# Patient Record
Sex: Female | Born: 1970 | Race: White | Hispanic: No | Smoking: Never smoker
Health system: Southern US, Community
[De-identification: ages and names within clinical notes are randomized; demographics above are authoritative.]

## PROBLEM LIST (undated history)

## (undated) DIAGNOSIS — I252 Old myocardial infarction: Secondary | ICD-10-CM

## (undated) DIAGNOSIS — E11319 Type 2 diabetes mellitus with unspecified diabetic retinopathy without macular edema: Secondary | ICD-10-CM

## (undated) DIAGNOSIS — H35039 Hypertensive retinopathy, unspecified eye: Secondary | ICD-10-CM

## (undated) DIAGNOSIS — E119 Type 2 diabetes mellitus without complications: Secondary | ICD-10-CM

## (undated) DIAGNOSIS — M1990 Unspecified osteoarthritis, unspecified site: Secondary | ICD-10-CM

## (undated) DIAGNOSIS — I10 Essential (primary) hypertension: Secondary | ICD-10-CM

## (undated) DIAGNOSIS — I2510 Atherosclerotic heart disease of native coronary artery without angina pectoris: Secondary | ICD-10-CM

## (undated) DIAGNOSIS — E785 Hyperlipidemia, unspecified: Secondary | ICD-10-CM

## (undated) HISTORY — PX: CARDIAC CATHETERIZATION: SHX172

## (undated) HISTORY — DX: Hypertensive retinopathy, unspecified eye: H35.039

## (undated) HISTORY — PX: TRIGGER FINGER RELEASE: SHX641

## (undated) HISTORY — DX: Type 2 diabetes mellitus with unspecified diabetic retinopathy without macular edema: E11.319

---

## 1998-02-23 ENCOUNTER — Emergency Department (HOSPITAL_COMMUNITY): Admission: EM | Admit: 1998-02-23 | Discharge: 1998-02-23 | Payer: Self-pay | Admitting: Emergency Medicine

## 1998-02-25 ENCOUNTER — Emergency Department (HOSPITAL_COMMUNITY): Admission: EM | Admit: 1998-02-25 | Discharge: 1998-02-26 | Payer: Self-pay | Admitting: Emergency Medicine

## 1998-11-01 ENCOUNTER — Emergency Department (HOSPITAL_COMMUNITY): Admission: EM | Admit: 1998-11-01 | Discharge: 1998-11-01 | Payer: Self-pay | Admitting: Internal Medicine

## 1999-09-02 ENCOUNTER — Emergency Department (HOSPITAL_COMMUNITY): Admission: EM | Admit: 1999-09-02 | Discharge: 1999-09-02 | Payer: Self-pay | Admitting: *Deleted

## 2003-07-13 ENCOUNTER — Emergency Department (HOSPITAL_COMMUNITY): Admission: EM | Admit: 2003-07-13 | Discharge: 2003-07-13 | Payer: Self-pay | Admitting: Emergency Medicine

## 2003-07-13 ENCOUNTER — Encounter: Payer: Self-pay | Admitting: Emergency Medicine

## 2016-02-07 DIAGNOSIS — M659 Unspecified synovitis and tenosynovitis, unspecified site: Secondary | ICD-10-CM | POA: Insufficient documentation

## 2016-02-16 ENCOUNTER — Encounter (HOSPITAL_BASED_OUTPATIENT_CLINIC_OR_DEPARTMENT_OTHER): Payer: Self-pay | Admitting: Emergency Medicine

## 2016-02-16 ENCOUNTER — Emergency Department (HOSPITAL_BASED_OUTPATIENT_CLINIC_OR_DEPARTMENT_OTHER)
Admission: EM | Admit: 2016-02-16 | Discharge: 2016-02-16 | Disposition: A | Discharge status: Home / Self Care | Payer: Self-pay | Attending: Emergency Medicine | Admitting: Emergency Medicine

## 2016-02-16 DIAGNOSIS — E119 Type 2 diabetes mellitus without complications: Secondary | ICD-10-CM | POA: Insufficient documentation

## 2016-02-16 DIAGNOSIS — Z791 Long term (current) use of non-steroidal anti-inflammatories (NSAID): Secondary | ICD-10-CM | POA: Insufficient documentation

## 2016-02-16 DIAGNOSIS — M79644 Pain in right finger(s): Secondary | ICD-10-CM | POA: Insufficient documentation

## 2016-02-16 HISTORY — DX: Type 2 diabetes mellitus without complications: E11.9

## 2016-02-16 NOTE — Discharge Instructions (Signed)

## 2016-02-16 NOTE — ED Notes (Signed)
45 yof PMHx DM, presents with right thumb pain since January 07, 2016. Seen PCP, was dx with "trigger finger" and splinted x 4 weeks. Followed up w/ hand surgeon, on 5/15. Received steroid injection. Has MRI scheduled for 5/30.  No relief with naproxen.   Requesting MRI to be done today

## 2016-02-16 NOTE — ED Provider Notes (Signed)
CSN: [id number]     Arrival date & time 02/16/16  0726 History   None    Chief Complaint  Patient presents with  . Thumb Pain    HPI   Sarah Kane is a 45 year old lady with type 2 diabetes here with thumb pain.  In early April, she awoke one morning and could not flex her right thumb DIP joint. She denied any preceding injury, associate effusion, or erythema, and this had never happened to her before. She went to an urgent care where x-rays reportedly did not show subluxation nor osteoarthritic changes. She then went to a hand surgeon who was reportedly puzzled by her pathology and ordered an MRI that is scheduled for next Monday. She decided to come in to the emergency department today for a second opinion. She simply cannot flex her right thumb DIP but denies any locking sensation, clicking, swelling, redness, fevers, or excruciating pain.  Past Medical History  Diagnosis Date  . Diabetes mellitus without complication [facility])    Past Surgical History  Procedure Laterality Date  . Cesarean section  1990   History reviewed. No pertinent family history. Social History  Substance Use Topics  . Smoking status: Never Smoker   . Smokeless tobacco: None  . Alcohol Use: No   OB History    No data available     Review of Systems  Constitutional: Negative for fever and chills.  Musculoskeletal: Negative for myalgias, back pain, joint swelling and arthralgias.  Skin: Negative for rash.  Neurological: Negative for weakness and numbness.  Psychiatric/Behavioral: Negative for dysphoric mood. The patient is nervous/anxious.    Allergies  Review of patient's allergies indicates no known allergies.  Home Medications   Prior to Admission medications   Medication Sig Start Date End Date Taking? Authorizing Provider  NAPROXEN PO Take by mouth.   Yes Historical Provider, MD   BP 146/115 mmHg  Pulse 96  Temp(Src) 98.2 F (36.8 C) (Oral)  Resp 18  Ht 5' (1.524 m)  Wt 74.844 kg  BMI 32.22  kg/m2  SpO2 100% Physical Exam  Constitutional: She appears well-developed and well-nourished.  HENT:  Head: Normocephalic and atraumatic.  Eyes: Conjunctivae are normal. Pupils are equal, round, and reactive to light.  Neck: Normal range of motion. Neck supple.  Cardiovascular: Normal rate and regular rhythm.   Pulmonary/Chest: Effort normal and breath sounds normal.  Musculoskeletal:  There is no overt swelling, erythema, nodules, hypothenar atrophy, nor skin changes of the hands. Passive flexion of the right 1st DIP is restricted to about 20 degrees due to pain. Otherwise, opposition and all of her other intrinsic hand muscles are intact with strength 5/5.  Skin: Skin is warm and dry. No rash noted.    ED Course  Procedures (including critical care time) Labs Review Labs Reviewed - No data to display  Imaging Review No results found. I have personally reviewed and evaluated these images and lab results as part of my medical decision-making.   EKG Interpretation None      MDM   Final diagnoses:  Thumb pain, right   Sarah Kane is a 45 year old lady here for a second opinion of isolated decreased range of motion of the right first DIP. Frankly, I don't know have a great explanation of her symptom. My best guess is this is isolated osteoarthritis of the DIP joint, but this should have been seen on previous X-rays. There is no locking sensation nor radial pain so I  doubt de Quervain's tendinopathy. The joint is not erythematous nor with effusion so I doubt gout. We will get her a thumb spica and have her follow-up closely with the hand surgeon.  Selina Cooley, MD 02/16/16 1610  Geoffery Lyons, MD 02/16/16 [phone]

## 2016-03-03 DIAGNOSIS — M65311 Trigger thumb, right thumb: Secondary | ICD-10-CM | POA: Insufficient documentation

## 2016-06-16 DIAGNOSIS — IMO0002 Reserved for concepts with insufficient information to code with codable children: Secondary | ICD-10-CM | POA: Insufficient documentation

## 2016-09-08 ENCOUNTER — Emergency Department (HOSPITAL_BASED_OUTPATIENT_CLINIC_OR_DEPARTMENT_OTHER)
Admission: EM | Admit: 2016-09-08 | Discharge: 2016-09-08 | Disposition: A | Discharge status: Home / Self Care | Payer: BLUE CROSS/BLUE SHIELD | Attending: Emergency Medicine | Admitting: Emergency Medicine

## 2016-09-08 ENCOUNTER — Encounter (HOSPITAL_BASED_OUTPATIENT_CLINIC_OR_DEPARTMENT_OTHER): Payer: Self-pay | Admitting: *Deleted

## 2016-09-08 DIAGNOSIS — M06041 Rheumatoid arthritis without rheumatoid factor, right hand: Secondary | ICD-10-CM | POA: Diagnosis not present

## 2016-09-08 DIAGNOSIS — M069 Rheumatoid arthritis, unspecified: Secondary | ICD-10-CM

## 2016-09-08 DIAGNOSIS — Z79899 Other long term (current) drug therapy: Secondary | ICD-10-CM | POA: Insufficient documentation

## 2016-09-08 DIAGNOSIS — E119 Type 2 diabetes mellitus without complications: Secondary | ICD-10-CM | POA: Diagnosis not present

## 2016-09-08 DIAGNOSIS — M79641 Pain in right hand: Secondary | ICD-10-CM | POA: Diagnosis present

## 2016-09-08 HISTORY — DX: Unspecified osteoarthritis, unspecified site: M19.90

## 2016-09-08 MED ORDER — OXYCODONE-ACETAMINOPHEN 5-325 MG PO TABS: 1.0000 | ORAL_TABLET | ORAL | 0 refills | Status: DC | PRN

## 2016-09-08 MED ORDER — METHYLPREDNISOLONE 4 MG PO TBPK: ORAL_TABLET | ORAL | 0 refills | Status: DC

## 2016-09-08 MED FILL — METHYLPREDNISOLONE 4 MG TAB: 4 | 6 days supply | Qty: 21 | Fill #0

## 2016-09-08 MED FILL — OXYCODONE/APAP 5-325: 5-325 | 3 days supply | Qty: 15 | Fill #0

## 2016-09-08 NOTE — ED Triage Notes (Signed)
Right hand pain. States she needs pain medication for arthritis in that hand.

## 2016-09-08 NOTE — ED Notes (Signed)
Ongoing R hand pain since April. Pt dx with trigger thumb and had surgery for same in July without relief. Pt has had PT and has since had swelling to R hand. Pt has since been dx with RA. Pt was seen by her doctor on 12/12 and was referred to a rheumatologist and has not been able to schedule an appt yet. Pt was given meloxicam but states this is not working.  Pt presents requesting pain medication for same. Pt had is swollen, pt states this swelling is baseline since her surgery.

## 2016-09-08 NOTE — ED Provider Notes (Signed)
[facility]   CSN: [id number] Arrival date & time: 09/08/16  1114     History   Chief Complaint Chief Complaint  Patient presents with  . Hand Pain    HPI Sarah Kane is a 45 y.o. female.  HPI   Patient is a 45 year old female with history of diabetes and arthritis who presents the ED with complaints of continued chronic right hand pain and swelling. Patient reports after having surgery for her right thumb trigger finger in July she has continued to have right hand pain and swelling. She notes she has been evaluated by multiple physicians including Dr. Rosalia Hammers [facility]) and Dr. Orlan Leavens (hand surgery) and had multiple imaging studies performed including x-rays and MRI which showed findings consistent with rheumatoid arthritis. Patient reports she was referred to a rheumatologist but is unable to make an appointment until 3 months from now. She states she has been taking meloxicam without relief. Patient denies any recent fall, trauma or injury. She reports her symptoms today are consistent with symptoms she has been having over the past 4-6 months. Denies fever, chills, redness, warmth, numbness, tingling, weakness.  Past Medical History:  Diagnosis Date  . Arthritis   . Diabetes mellitus without complication (HCC)     There are no active problems to display for this patient.   Past Surgical History:  Procedure Laterality Date  . CESAREAN SECTION  1990    OB History    No data available       Home Medications    Prior to Admission medications   Medication Sig Start Date End Date Taking? Authorizing Provider  MELOXICAM PO Take by mouth.   Yes Historical Provider, MD  methylPREDNISolone (MEDROL DOSEPAK) 4 MG TBPK tablet Day 1: Two tablets before breakfast, one after lunch, one after dinner, and two at bedtime. If started late in the day, take all six tablets at once or divide into two or three doses, unless otherwise directed by prescriber. Day 2:  One tablet before breakfast, one after lunch, one after dinner, and two at bedtime Day 3: One tablet before breakfast, one after lunch, one after dinner, and one at bedtime Day 4: One tablet before breakfast, one af 09/08/16   [name], PA-C  NAPROXEN PO Take by mouth.    Historical Provider, MD  oxyCODONE-acetaminophen (PERCOCET/ROXICET) 5-325 MG tablet Take 1 tablet by mouth every 4 (four) hours as needed. 09/08/16   [name], PA-C    Family History No family history on file.  Social History Social History  Substance Use Topics  . Smoking status: Never Smoker  . Smokeless tobacco: Never Used  . Alcohol use No     Allergies   Patient has no known allergies.   Review of Systems Review of Systems  Constitutional: Negative for fever.  Musculoskeletal: Positive for arthralgias (right hand) and joint swelling.  Skin: Negative for wound.  Neurological: Negative for weakness and numbness.     Physical Exam Updated Vital Signs BP 149/100 (BP Location: Left Arm)   Pulse 77   Temp 97.6 F (36.4 C) (Oral)   Resp 20   Ht 5' (1.524 m)   Wt 72.6 kg   SpO2 100%   BMI 31.25 kg/m   Physical Exam  Constitutional: She is oriented to person, place, and time. She appears well-developed and well-nourished. No distress.  HENT:  Head: Normocephalic and atraumatic.  Eyes: Conjunctivae and EOM are normal. Right eye exhibits no discharge. Left  eye exhibits no discharge. No scleral icterus.  Neck: Normal range of motion. Neck supple.  Cardiovascular: Normal rate and intact distal pulses.   Pulmonary/Chest: Effort normal.  Musculoskeletal: She exhibits tenderness. She exhibits no edema or deformity.       Right wrist: Normal.       Right hand: She exhibits decreased range of motion, tenderness and swelling. She exhibits normal two-point discrimination, normal capillary refill, no deformity and no laceration. Normal sensation noted. Normal strength noted.        Hands: Right hand with diffuse mild swelling and TTP over right 2nd and 3rd MCP joints. Dec ROM of right hand, pt unable to fully extend her fingers or make a fist which she reports has been chronic since her surgery and is unchanged. Sensation grossly intact. 2+ radial pulse. Cap refill less than 2. No erythema, warmth, abrasion, contusion or laceration noted.  Neurological: She is alert and oriented to person, place, and time.  Skin: Skin is warm and dry. Capillary refill takes less than 2 seconds. She is not diaphoretic.  Nursing note and vitals reviewed.    ED Treatments / Results  Labs (all labs ordered are listed, but only abnormal results are displayed) Labs Reviewed - No data to display  EKG  EKG Interpretation None       Radiology No results found.  Procedures Procedures (including critical care time)  Medications Ordered in ED Medications - No data to display   Initial Impression / Assessment and Plan / ED Course  I have reviewed the triage vital signs and the nursing notes.  Pertinent labs & imaging results that were available during my care of the patient were reviewed by me and considered in my medical decision making (see chart for details).  Clinical Course     Patient presents with continued right hand pain and swelling over the past 6 months. She reports being seen by multiple physicians regarding her symptoms and reports after having x-rays and MRI performed she was diagnosed with rheumatoid arthritis. She reports she hasn't appointment scheduled with a rheumatologist but it is not until 3 months from now. She reports she has been taking meloxicam, naproxen and ibuprofen without relief of symptoms. Denies fever or any recent injury or trauma. VSS. Exam revealed diffuse mild swelling and tenderness to right hand, decreased range of motion with full flexion and extension of right digits which patient reports is chronic and unchanged. Right upper extremity  otherwise neurovascularly intact. Chart review shows MRI of right hand performed on 08/11/16 revealed multifocal MCP arthritis most consistent with rheumatoid arthritis with tinnitus and periarticular erosion involving the third MCP joint. Patient symptoms appeared to be consistent with her history of rheumatoid arthritis. Plan to discharge patient home with Solu-Medrol Dosepak, pain meds and symptomatic treatment. Advised patient she needs to follow up with rheumatology for further management of her chronic right hand pain related to her rheumatoid arthritis. Patient given information to follow-up with rheumatology outpatient. Discussed return precautions.  Final Clinical Impressions(s) / ED Diagnoses   Final diagnoses:  Rheumatoid arthritis involving right hand, unspecified rheumatoid factor presence (HCC)    New Prescriptions Discharge Medication List as of 09/08/2016 12:02 PM    START taking these medications   Details  methylPREDNISolone (MEDROL DOSEPAK) 4 MG TBPK tablet Day 1: Two tablets before breakfast, one after lunch, one after dinner, and two at bedtime. If started late in the day, take all six tablets at once or divide into two or  three doses, unless otherwise directed by prescriber. Day 2: One tablet before bre akfast, one after lunch, one after dinner, and two at bedtime Day 3: One tablet before breakfast, one after lunch, one after dinner, and one at bedtime Day 4: One tablet before breakfast, one af, Print    oxyCODONE-acetaminophen (PERCOCET/ROXICET) 5-325 MG tablet Take 1 tablet by mouth every 4 (four) hours as needed., Starting Fri 09/08/2016, Print         Satira Sark [place], [place] 09/08/16 1237    Lyndal Pulley, MD 09/08/16 1256

## 2016-09-08 NOTE — Discharge Instructions (Signed)
Take your medications as prescribed. Call the rheumatology clinic listed below to schedule a follow-up appointment for further management of your rheumatoid arthritis. Please return to the Emergency Department if symptoms worsen or new onset of fever, worsening swelling/pain, redness, numbness, tingling, weakness, decreased range of motion.

## 2016-10-02 DIAGNOSIS — Z79899 Other long term (current) drug therapy: Secondary | ICD-10-CM | POA: Insufficient documentation

## 2016-10-02 DIAGNOSIS — M79641 Pain in right hand: Secondary | ICD-10-CM | POA: Insufficient documentation

## 2016-10-02 DIAGNOSIS — R600 Localized edema: Secondary | ICD-10-CM | POA: Insufficient documentation

## 2017-03-06 DIAGNOSIS — G5600 Carpal tunnel syndrome, unspecified upper limb: Secondary | ICD-10-CM | POA: Insufficient documentation

## 2017-10-03 DIAGNOSIS — M19012 Primary osteoarthritis, left shoulder: Secondary | ICD-10-CM | POA: Insufficient documentation

## 2017-10-03 DIAGNOSIS — M7550 Bursitis of unspecified shoulder: Secondary | ICD-10-CM | POA: Insufficient documentation

## 2018-01-18 ENCOUNTER — Other Ambulatory Visit: Payer: Self-pay

## 2018-01-18 ENCOUNTER — Encounter (HOSPITAL_BASED_OUTPATIENT_CLINIC_OR_DEPARTMENT_OTHER): Payer: Self-pay | Admitting: Emergency Medicine

## 2018-01-18 ENCOUNTER — Emergency Department (HOSPITAL_BASED_OUTPATIENT_CLINIC_OR_DEPARTMENT_OTHER)
Admission: EM | Admit: 2018-01-18 | Discharge: 2018-01-18 | Disposition: A | Discharge status: Home / Self Care | Payer: BLUE CROSS/BLUE SHIELD | Attending: Emergency Medicine | Admitting: Emergency Medicine

## 2018-01-18 DIAGNOSIS — G8929 Other chronic pain: Secondary | ICD-10-CM | POA: Insufficient documentation

## 2018-01-18 DIAGNOSIS — M25512 Pain in left shoulder: Secondary | ICD-10-CM | POA: Diagnosis not present

## 2018-01-18 DIAGNOSIS — E119 Type 2 diabetes mellitus without complications: Secondary | ICD-10-CM | POA: Diagnosis not present

## 2018-01-18 DIAGNOSIS — Z79899 Other long term (current) drug therapy: Secondary | ICD-10-CM | POA: Insufficient documentation

## 2018-01-18 DIAGNOSIS — Z046 Encounter for general psychiatric examination, requested by authority: Secondary | ICD-10-CM | POA: Diagnosis present

## 2018-01-18 DIAGNOSIS — M25511 Pain in right shoulder: Secondary | ICD-10-CM | POA: Insufficient documentation

## 2018-01-18 NOTE — Discharge Instructions (Addendum)
You were evaluated in the emergency department for worsening of your chronic right and left shoulder pain.  He should continue to take your anti-inflammatories and follow-up with orthopedics to discuss further management of this problem.  I understand you are very frustrated about the chronic pain and am concerned about your statements about hurting herself.  We do recommend that you seek some counseling to help you deal with this chronic condition.  If you have any thoughts that you would actually hurt yourself you should come to the emergency department and seek help.

## 2018-01-18 NOTE — ED Triage Notes (Signed)
L shoulder pain x 1 year. States her doctors have not helped her. She says she told her employer today that she was going to kill herself so the police came. Pt says she didn't mean it.

## 2018-01-18 NOTE — ED Notes (Signed)
ED Provider at bedside. 

## 2018-01-18 NOTE — ED Provider Notes (Signed)
[facility] EMERGENCY DEPARTMENT Provider Note   CSN: [id number] Arrival date & time: 01/18/18  8341     History   Chief Complaint Chief Complaint  Patient presents with  . Shoulder Pain    HPI Sarah Kane is a 47 y.o. female.  She is here complaining of right and left shoulder pain that is been going on for over a year.  She has limitations secondary to her pain and she states she has been evaluated multiple times by various physicians who have not been able to give an answer what her pain is from.  Its increased with movement and she rates it as severe and sharp.  It sounds like today overnight she was overheard to say she was going to kill herself because the pain was so bad.  Patient here denies that she has any thoughts of harming herself but she is just frustrated about the pain.  She states she is unable to raise her hands up to her head and it even difficult to wipe her bottom.  The history is provided by the patient.  Shoulder Pain   This is a chronic problem. The problem occurs constantly. The problem has not changed since onset.The pain is present in the right shoulder, left shoulder and right hand. The quality of the pain is described as sharp. The pain is severe. Associated symptoms include limited range of motion and stiffness. Pertinent negatives include no numbness, no tingling and no itching. The symptoms are aggravated by activity. She has tried arthritis medications for the symptoms. The treatment provided no relief. There has been no history of extremity trauma.    Past Medical History:  Diagnosis Date  . Arthritis   . Diabetes mellitus without complication (HCC)     There are no active problems to display for this patient.   Past Surgical History:  Procedure Laterality Date  . CESAREAN SECTION  1990     OB History   None      Home Medications    Prior to Admission medications   Medication Sig Start Date End Date Taking? Authorizing  Provider  MELOXICAM PO Take by mouth.    [provider]  methylPREDNISolone (MEDROL DOSEPAK) 4 MG TBPK tablet Day 1: Two tablets before breakfast, one after lunch, one after dinner, and two at bedtime. If started late in the day, take all six tablets at once or divide into two or three doses, unless otherwise directed by prescriber. Day 2: One tablet before breakfast, one after lunch, one after dinner, and two at bedtime Day 3: One tablet before breakfast, one after lunch, one after dinner, and one at bedtime Day 4: One tablet before breakfast, one af 09/08/16   [name], PA-C  NAPROXEN PO Take by mouth.    [provider]  oxyCODONE-acetaminophen (PERCOCET/ROXICET) 5-325 MG tablet Take 1 tablet by mouth every 4 (four) hours as needed. 09/08/16   [name], PA-C    Family History No family history on file.  Social History Social History   Tobacco Use  . Smoking status: Never Smoker  . Smokeless tobacco: Never Used  Substance Use Topics  . Alcohol use: No  . Drug use: No     Allergies   Patient has no known allergies.   Review of Systems Review of Systems  Constitutional: Negative for fever.  HENT: Negative for sore throat.   Respiratory: Negative for shortness of breath.   Cardiovascular: Negative for chest pain.  Gastrointestinal: Negative for abdominal pain.  Genitourinary: Negative for dysuria.  Musculoskeletal: Positive for arthralgias and stiffness. Negative for back pain, joint swelling and neck pain.  Skin: Negative for itching and rash.  Neurological: Negative for tingling and numbness.  Psychiatric/Behavioral: Negative for self-injury and suicidal ideas.     Physical Exam Updated Vital Signs BP (!) 169/110 (BP Location: Left Arm)   Pulse (!) 106   Temp 98.5 F (36.9 C) (Oral)   Resp 18   Ht 5' (1.524 m)   Wt 77.1 kg (170 lb)   SpO2 100%   BMI 33.20 kg/m   Physical Exam  Constitutional: She appears  well-developed and well-nourished.  HENT:  Head: Normocephalic and atraumatic.  Eyes: Conjunctivae are normal.  Neck: Neck supple.  Cardiovascular: Normal rate and regular rhythm.  Pulmonary/Chest: Effort normal and breath sounds normal. She has no wheezes. She has no rales.  Musculoskeletal:       Right shoulder: She exhibits decreased range of motion, tenderness, bony tenderness and pain. She exhibits no swelling, no effusion, no deformity, no laceration and normal pulse.       Left shoulder: She exhibits decreased range of motion, tenderness, bony tenderness and pain. She exhibits no swelling, no effusion, no deformity and normal pulse.       Right hand: She exhibits decreased range of motion and tenderness. She exhibits no laceration and no swelling. Normal sensation noted.  Neurological: She is alert. GCS eye subscore is 4. GCS verbal subscore is 5. GCS motor subscore is 6.  Skin: Skin is warm and dry.  Psychiatric: She has a normal mood and affect.     ED Treatments / Results  Labs (all labs ordered are listed, but only abnormal results are displayed) Labs Reviewed - No data to display  EKG None  Radiology No results found.  Procedures Procedures (including critical care time)  Medications Ordered in ED Medications - No data to display   Initial Impression / Assessment and Plan / ED Course  I have reviewed the triage vital signs and the nursing notes.  Pertinent labs & imaging results that were available during my care of the patient were reviewed by me and considered in my medical decision making (see chart for details).  Clinical Course as of Jan 20 1724  Fri Jan 18, 2018  0907 Patient here with acute on chronic shoulder pain.  She has been very frustrated with her symptoms and the lack of any improvement with any medication.  She did admit that she had said that she wanted to kill herself but she states this is a throw a statement to her and she always feels this  way when she thinks about her pain.  She denies any intent to hurt herself and is just frustrated with being in constant pain.  We talked about getting x-rays, but after here in the limitations that there would only show a fracture dislocation she did not feel that was necessary.   [MB]  0908 Ultimately I do not think she needs psychiatric evaluation but she would benefit from being referred to some counseling and will provide her some information for that.  Also going to give her the number for orthopedic shoulder doctor to follow-up with to see if they can help her with her symptoms.   [MB]    Clinical Course User Index [MB] Terrilee Files, MD     Final Clinical Impressions(s) / ED Diagnoses   Final diagnoses:  Chronic pain of  both shoulders    ED Discharge Orders    None       Terrilee Files, MD 01/19/18 1726

## 2018-03-10 ENCOUNTER — Emergency Department (HOSPITAL_BASED_OUTPATIENT_CLINIC_OR_DEPARTMENT_OTHER)
Admission: EM | Admit: 2018-03-10 | Discharge: 2018-03-10 | Disposition: A | Discharge status: Home / Self Care | Payer: No Typology Code available for payment source | Attending: Emergency Medicine | Admitting: Emergency Medicine

## 2018-03-10 ENCOUNTER — Encounter (HOSPITAL_BASED_OUTPATIENT_CLINIC_OR_DEPARTMENT_OTHER): Payer: Self-pay | Admitting: Emergency Medicine

## 2018-03-10 ENCOUNTER — Emergency Department (HOSPITAL_BASED_OUTPATIENT_CLINIC_OR_DEPARTMENT_OTHER): Payer: No Typology Code available for payment source

## 2018-03-10 ENCOUNTER — Other Ambulatory Visit: Payer: Self-pay

## 2018-03-10 DIAGNOSIS — Y9289 Other specified places as the place of occurrence of the external cause: Secondary | ICD-10-CM | POA: Diagnosis not present

## 2018-03-10 DIAGNOSIS — Z79899 Other long term (current) drug therapy: Secondary | ICD-10-CM | POA: Insufficient documentation

## 2018-03-10 DIAGNOSIS — Y9389 Activity, other specified: Secondary | ICD-10-CM | POA: Diagnosis not present

## 2018-03-10 DIAGNOSIS — M25561 Pain in right knee: Secondary | ICD-10-CM | POA: Insufficient documentation

## 2018-03-10 DIAGNOSIS — M25551 Pain in right hip: Secondary | ICD-10-CM | POA: Diagnosis present

## 2018-03-10 DIAGNOSIS — Y990 Civilian activity done for income or pay: Secondary | ICD-10-CM | POA: Diagnosis not present

## 2018-03-10 DIAGNOSIS — E119 Type 2 diabetes mellitus without complications: Secondary | ICD-10-CM | POA: Diagnosis not present

## 2018-03-10 DIAGNOSIS — W19XXXA Unspecified fall, initial encounter: Secondary | ICD-10-CM

## 2018-03-10 DIAGNOSIS — M25562 Pain in left knee: Secondary | ICD-10-CM | POA: Insufficient documentation

## 2018-03-10 DIAGNOSIS — W010XXA Fall on same level from slipping, tripping and stumbling without subsequent striking against object, initial encounter: Secondary | ICD-10-CM | POA: Diagnosis not present

## 2018-03-10 MED ORDER — OXYCODONE-ACETAMINOPHEN 5-325 MG PO TABS
1.0000 | ORAL_TABLET | Freq: Once | ORAL | Status: AC
  Administered 2018-03-10: 1 via ORAL
  Filled 2018-03-10: qty 1

## 2018-03-10 NOTE — ED Provider Notes (Signed)
[facility] EMERGENCY DEPARTMENT Provider Note   CSN: [id number] Arrival date & time: 03/10/18  1542     History   Chief Complaint Chief Complaint  Patient presents with  . Fall    HPI KORAL THADEN is a 47 y.o. female.  HPI   Patient is a 47yo female with a history of type two diabetes and bilateral frozen shoulder who presents to the emergency department for evaluation after a mechanical fall earlier today. Patient states that she accidentally tripped over a vacuum cord while at work and fell forward onto her bilateral hands and knees. Denies hitting her head or loss of conciousness. Reports pain primarily over the lateral aspect of the right hip. Pain is sharp and 10/10 in severity with any movement or with weight bearing. She denies pain outside of movement. She also reports bruising over bilateral knee joints and some overlying soreness with flexion/extension. She has not taken any medication for pain prior to arrival. Denies headache, neck pain, back pain, numbness, weakness, open wounds, arthralgias elsewhere. Is able to ambulate independently, although very painful.   Past Medical History:  Diagnosis Date  . Arthritis   . Diabetes mellitus without complication (HCC)     There are no active problems to display for this patient.   Past Surgical History:  Procedure Laterality Date  . CESAREAN SECTION  1990     OB History   None      Home Medications    Prior to Admission medications   Medication Sig Start Date End Date Taking? Authorizing Provider  glimepiride (AMARYL) 2 MG tablet Take 2 mg by mouth daily with breakfast.   Yes [provider]  glimepiride (AMARYL) 2 MG tablet Take 2 tablets by mouth. 08/01/16  Yes [provider]  celecoxib (CELEBREX) 200 MG capsule 1 capsule. 12/31/17   [provider]  gabapentin (NEURONTIN) 300 MG capsule 1 capsule. 12/27/17   [provider]  MELOXICAM PO Take by mouth.    [provider]  methylPREDNISolone (MEDROL DOSEPAK) 4 MG TBPK tablet Day 1: Two tablets before breakfast, one after lunch, one after dinner, and two at bedtime. If started late in the day, take all six tablets at once or divide into two or three doses, unless otherwise directed by prescriber. Day 2: One tablet before breakfast, one after lunch, one after dinner, and two at bedtime Day 3: One tablet before breakfast, one after lunch, one after dinner, and one at bedtime Day 4: One tablet before breakfast, one af 09/08/16   [name], PA-C  NAPROXEN PO Take by mouth.    [provider]  oxyCODONE-acetaminophen (PERCOCET/ROXICET) 5-325 MG tablet Take 1 tablet by mouth every 4 (four) hours as needed. 09/08/16   [name], PA-C  OZEMPIC 0.25 or 0.5 MG/DOSE SOPN 2 mLs. 12/21/17   [provider]    Family History History reviewed. No pertinent family history.  Social History Social History   Tobacco Use  . Smoking status: Never Smoker  . Smokeless tobacco: Never Used  Substance Use Topics  . Alcohol use: No  . Drug use: No     Allergies   Patient has no known allergies.   Review of Systems Review of Systems  Constitutional: Negative for chills and fever.  Musculoskeletal: Positive for arthralgias (right hip and bilateral knees. ). Negative for back pain.  Skin: Positive for color change (ecchymosis overlying bilateral knees). Negative for wound.  Neurological: Negative for weakness,  numbness and headaches.     Physical Exam Updated Vital Signs BP (!) 148/100 (BP Location: Left Arm)   Pulse 99   Temp 98.1 F (36.7 C) (Oral)   Resp 20   Ht 5' (1.524 m)   Wt 77.1 kg (170 lb)   SpO2 100%   BMI 33.20 kg/m   Physical Exam  Constitutional: She is oriented to person, place, and time. She appears well-developed and well-nourished. No distress.  HENT:  Head: Normocephalic and atraumatic.  Eyes: Right eye exhibits no discharge. Left  eye exhibits no discharge.  Pulmonary/Chest: Effort normal. No respiratory distress.  Musculoskeletal:  Right lateral hip tender to palpation. No overlying ecchymosis, erythema or break in skin. Limited active ROM at the hip due to pain. Ecchymosis overlying bilateral patellas with overlying tenderness. No joint effusion, erythema or effusion noted. Full active flexion/extension of bilateral knees. No varus or valgus laxity.  Negative drawer's and McMurray's sign.  All compartments soft. No tenderness over bilateral ankles.  DP pulses 2+ bilaterally.  Distal sensation to light touch intact in bilateral lower extremities.  Neurological: She is alert and oriented to person, place, and time. Coordination normal.  Gait normal and coordination and balance.  Skin: Skin is warm and dry. Capillary refill takes less than 2 seconds. She is not diaphoretic.  Psychiatric: She has a normal mood and affect. Her behavior is normal.  Nursing note and vitals reviewed.    ED Treatments / Results  Labs (all labs ordered are listed, but only abnormal results are displayed) Labs Reviewed - No data to display  EKG None  Radiology Dg Knee Complete 4 Views Left  Result Date: 03/10/2018 CLINICAL DATA:  Tripped over cord at work. EXAM: LEFT KNEE - COMPLETE 4+ VIEW COMPARISON:  None. FINDINGS: No evidence of fracture, dislocation, or joint effusion. No evidence of arthropathy or other focal bone abnormality. Soft tissues are unremarkable. IMPRESSION: Negative. Electronically Signed   By: Signa Kell M.D.   On: 03/10/2018 17:52   Dg Knee Complete 4 Views Right  Result Date: 03/10/2018 CLINICAL DATA:  Fall.  Right hip pain. EXAM: RIGHT KNEE - COMPLETE 4+ VIEW COMPARISON:  None FINDINGS: No evidence of fracture, dislocation, or joint effusion. No evidence of arthropathy or other focal bone abnormality. Soft tissues are unremarkable. IMPRESSION: Negative. Electronically Signed   By: Signa Kell M.D.   On:  03/10/2018 17:51   Dg Hip Unilat W Or Wo Pelvis 2-3 Views Right  Result Date: 03/10/2018 CLINICAL DATA:  Fall.  Right hip pain. EXAM: DG HIP (WITH OR WITHOUT PELVIS) 2-3V RIGHT COMPARISON:  None FINDINGS: There is no evidence of hip fracture or dislocation. There is no evidence of arthropathy or other focal bone abnormality. IMPRESSION: 1. No acute findings. 2. If there is high clinical suspicion for occult fracture or the patient refuses to weightbear, consider further evaluation with MRI. Although CT is expeditious, evidence is lacking regarding accuracy of CT over plain film radiography. Electronically Signed   By: Signa Kell M.D.   On: 03/10/2018 17:50    Procedures Procedures (including critical care time)  Medications Ordered in ED Medications  oxyCODONE-acetaminophen (PERCOCET/ROXICET) 5-325 MG per tablet 1 tablet (1 tablet Oral Given 03/10/18 1824)     Initial Impression / Assessment and Plan / ED Course  I have reviewed the triage vital signs and the nursing notes.  Pertinent labs & imaging results that were available during my care of the patient were reviewed by me and considered  in my medical decision making (see chart for details).     X-ray right hip, right knee and left knee without acute fracture or abnormality.  Patient is able to ambulate in the department without difficulty.  Bilateral lower extremities neurovascularly intact.  No wounds.  Have counseled her on RICE protocol and NSAIDs for pain.  Her blood pressure was elevated in the ER today, counseled her to follow-up with her PCP for recheck.  Discussed reasons to return to the emergency department and she agrees and voices understanding to the above plan and has no complaints prior to discharge.   Final Clinical Impressions(s) / ED Diagnoses   Final diagnoses:  Fall, initial encounter  Right hip pain  Acute pain of both knees    ED Discharge Orders    None       Lawrence Marseilles 03/11/18  1173    Pricilla Loveless, MD 03/12/18 [phone]

## 2018-03-10 NOTE — ED Notes (Signed)
Pt's supervisor in room and contacting the boss to find out if a drug screen is needed.

## 2018-03-10 NOTE — ED Notes (Signed)
UDS completed 

## 2018-03-10 NOTE — ED Triage Notes (Signed)
Patient states that she fell earlier at work. The patient states that seh is having pain to her bilateral knees and hands - patient has chronic pain to her bilateral shoulders " I have frozen shoulder"

## 2018-03-10 NOTE — Discharge Instructions (Addendum)
Your x-rays are reassuring.  No hip or knee fracture.  As we discussed please apply ice for 15 minutes at a time at least twice a day.  Elevate the leg and try to rest.  You can take ibuprofen 600 mg every 6 hours.  Follow-up with your regular doctor in a week if your symptoms are not improving.

## 2018-04-02 LAB — CBC AND DIFFERENTIAL
HEMOGLOBIN: 17.1 — AB (ref 12.0–16.0)
PLATELETS: 243 (ref 150–399)
WBC: 8.1

## 2018-04-02 LAB — LIPID PANEL
Cholesterol: 180 (ref 0–200)
HDL: 45 (ref 35–70)
LDL Cholesterol: 103
Triglycerides: 161 — AB (ref 40–160)

## 2018-04-02 LAB — VITAMIN D 25 HYDROXY (VIT D DEFICIENCY, FRACTURES): Vit D, 25-Hydroxy: 72.19

## 2018-04-02 LAB — HEMOGLOBIN A1C: HEMOGLOBIN A1C: 7.5

## 2018-05-23 ENCOUNTER — Encounter: Payer: Self-pay | Admitting: Osteopathic Medicine

## 2018-05-23 ENCOUNTER — Ambulatory Visit (INDEPENDENT_AMBULATORY_CARE_PROVIDER_SITE_OTHER): Payer: BLUE CROSS/BLUE SHIELD | Admitting: Osteopathic Medicine

## 2018-05-23 VITALS — BP 142/99 | HR 85 | Temp 98.1°F | Ht 60.0 in | Wt 170.4 lb

## 2018-05-23 DIAGNOSIS — E119 Type 2 diabetes mellitus without complications: Secondary | ICD-10-CM | POA: Diagnosis not present

## 2018-05-23 DIAGNOSIS — M810 Age-related osteoporosis without current pathological fracture: Secondary | ICD-10-CM | POA: Diagnosis not present

## 2018-05-23 DIAGNOSIS — G894 Chronic pain syndrome: Secondary | ICD-10-CM

## 2018-05-23 MED ORDER — GLIMEPIRIDE 4 MG PO TABS: 4.0000 mg | ORAL_TABLET | Freq: Every day | ORAL | 1 refills | Status: DC

## 2018-05-23 MED ORDER — SITAGLIPTIN PHOSPHATE 100 MG PO TABS: 100.0000 mg | ORAL_TABLET | Freq: Every day | ORAL | 1 refills | Status: DC

## 2018-05-23 MED ORDER — ALENDRONATE SODIUM 70 MG PO TABS: 70.0000 mg | ORAL_TABLET | ORAL | 0 refills | Status: DC

## 2018-05-23 MED ORDER — VITAMIN D (ERGOCALCIFEROL) 1.25 MG (50000 UNIT) PO CAPS: 50000.0000 [IU] | ORAL_CAPSULE | ORAL | 3 refills | Status: DC

## 2018-05-23 MED ORDER — GABAPENTIN 300 MG PO CAPS: 300.0000 mg | ORAL_CAPSULE | Freq: Three times a day (TID) | ORAL | 1 refills | Status: DC

## 2018-05-23 NOTE — Progress Notes (Signed)
HPI: Sarah Kane is a 47 y.o. female who  has a past medical history of Arthritis and Diabetes mellitus without complication (HCC).  she presents to [facility] today, 05/23/18,  for chief complaint of: Widespread pain  Patient reports significant pain symptoms which have been increasing lately.  She has run into some financial issues with previous medical office so she was no longer able to be seen there, she is here to establish care.  She reports all of this started when she underwent surgery for trigger finger, was recovering fine but after that she had some issues on that same hand with the middle finger and was not really able to get an answer to this.  She states that she essentially has "PTSD" from this issue.  She states she has a lot of mistrust for surgeons, she does not want to be given the run around again, does not want to get any injections.  At this point, shoulder pain is her greatest issue.  She states that she has been diagnosed with frozen shoulder.  She looks to be set up with an orthopedic surgeon, she is only had one visit for the shoulder problem.  She is very reluctant to undergo surgery.  She has also seen pain management recently at [facility].  I do not have records available at this point, she states that she was tried on oxycodone but this was not helpful, it took her a while to remember the medication that was helpful for her was actually oxycodone-acetaminophen.  She is also a diabetic: Occasions reviewed as below.  She states her last A1c was seven-point something.  She has a prescription for insulin but has never been on this medicine because a different doctor told her not to take it.   Previous physicians: Dr Kathrynn Speed - PCP - clinic payment problem [place] - ?pain management? - Dr. [facility] Address: [address] [place], [place] [address] Phone: [phone]     Past  medical, surgical, social and family history reviewed:  Patient Active Problem List   Diagnosis Date Noted  . Arthritis of left acromioclavicular joint 10/03/2017  . Subacromial bursitis 10/03/2017  . Carpal tunnel syndrome 03/06/2017  . Right hand pain 10/02/2016  . Complex regional pain syndrome 06/16/2016  . Trigger finger of right thumb 03/03/2016  . Joint synovitis 02/07/2016    Past Surgical History:  Procedure Laterality Date  . CESAREAN SECTION  1990    Social History   Tobacco Use  . Smoking status: Never Smoker  . Smokeless tobacco: Never Used  Substance Use Topics  . Alcohol use: No    Family History  Problem Relation Age of Onset  . High blood pressure Mother   . Diabetes Mother   . Diabetes Maternal Grandmother   . High blood pressure Maternal Grandmother   . Heart attack Maternal Grandfather   . Diabetes Maternal Grandfather   . High blood pressure Maternal Grandfather   . High blood pressure Paternal Grandmother   . High blood pressure Paternal Grandfather   . Diabetes Maternal Aunt   . Diabetes Maternal Uncle   . Skin cancer Maternal Uncle      Current medication list and allergy/intolerance information reviewed:    Current Meds  Medication Sig  . alendronate (FOSAMAX) 70 MG tablet Take 1 tablet (70 mg total) by mouth once a week. Take with a full glass of water on an empty stomach.  [name]  gabapentin (NEURONTIN) 300 MG capsule Take 1 capsule (300 mg total) by mouth 3 (three) times daily.  [name] glimepiride (AMARYL) 4 MG tablet Take 1 tablet (4 mg total) by mouth daily with breakfast.  . sitaGLIPtin (JANUVIA) 100 MG tablet Take 1 tablet (100 mg total) by mouth daily.  . Vitamin D, Ergocalciferol, (DRISDOL) 50000 units CAPS capsule Take 1 capsule (50,000 Units total) by mouth every 7 (seven) days.  . [DISCONTINUED] alendronate (FOSAMAX) 70 MG tablet TAKE 1 TABLET BY MOUTH ONCE A WEEK IN THE MORNING WITH A FULL GLASS OF WATER 30 MINUTES BEFORE THE FIRST MEAL  BEVERAGE OR MEDICATION OF THE  . [DISCONTINUED] gabapentin (NEURONTIN) 300 MG capsule 1 capsule.  . [DISCONTINUED] glimepiride (AMARYL) 2 MG tablet Take 2 mg by mouth daily with breakfast.  . [DISCONTINUED] OZEMPIC 0.25 or 0.5 MG/DOSE SOPN 2 mLs.  . [DISCONTINUED] sitaGLIPtin (JANUVIA) 100 MG tablet Take by mouth.  . [DISCONTINUED] Vitamin D, Ergocalciferol, (DRISDOL) 50000 units CAPS capsule Take by mouth.     No Known Allergies    Review of Systems:  Constitutional:  No  fever, no chills, No recent illness, No unintentional weight changes. +significant fatigue.   HEENT: No  headache, no vision change, no hearing change, No sore throat, No  sinus pressure  Cardiac: No  chest pain, No  pressure, No palpitations, No  Orthopnea  Respiratory:  No  shortness of breath. No  Cough  Gastrointestinal: No  abdominal pain, No  nausea, No  vomiting,  No  blood in stool, No  diarrhea, No  constipation   Musculoskeletal: +myalgia/arthralgia  Skin: No  Rash, No other wounds/concerning lesions  Genitourinary: No  incontinence, No  abnormal genital bleeding, No abnormal genital discharge  Hem/Onc: No  easy bruising/bleeding, No  abnormal lymph node  Endocrine: No cold intolerance,  No heat intolerance. No polyuria/polydipsia/polyphagia   Neurologic: No  weakness, No  dizziness, No  slurred speech/focal weakness/facial droop  Psychiatric: No  concerns with depression, No  concerns with anxiety, No sleep problems, No mood problems  Exam:  BP (!) 142/99 (BP Location: Right Arm, Patient Position: Sitting, Cuff Size: Normal)   Pulse 85   Temp 98.1 F (36.7 C) (Oral)   Ht 5' (1.524 m)   Wt 170 lb 6.4 oz (77.3 kg)   BMI 33.28 kg/m   Constitutional: VS see above. General Appearance: alert, well-developed, well-nourished, NAD  Eyes: Normal lids and conjunctive, non-icteric sclera  Ears, Nose, Mouth, Throat: MMM, Normal external inspection ears/nares/mouth/lips/gums.  Neck: No masses,  trachea midline. No thyroid enlargement. No tenderness/mass appreciated. No lymphadenopathy  Respiratory: Normal respiratory effort. no wheeze, no rhonchi, no rales  Cardiovascular: S1/S2 normal, no murmur, no rub/gallop auscultated. RRR. No lower extremity edema.   Musculoskeletal: Gait normal. No clubbing/cyanosis of digits.   Neurological: Normal balance/coordination. No tremor. No cranial nerve deficit on limited exam. Motor and sensation intact and symmetric. Cerebellar reflexes intact.   Skin: warm, dry, intact. No rash/ulcer.   Psychiatric: Normal judgment/insight. Normal mood and affect. Oriented x3.      ASSESSMENT/PLAN: Patient seems reluctant to accept diagnosis of frozen shoulder, bursitis, osteoarthritis as main reasons for her pain.  She states that she is not taking the Celebrex because that is for arthritis and that is not really her main problem.  I certainly suspect that there is a likely psychiatric component/fibromyalgia component to her symptoms.  I am reluctant to prescribe opiate pain medications without further record review from her PCP.  Patient  is amenable to waiting for record review, returning to clinic if needed to discuss further pain management options.  She states she is not able to go back to [facility] pain management due to financial reasons without system.  He is also reluctant to get lab work done at this time, I could not really do much to manage diabetes without knowing most recent A1c, sugars, etc.  Will review records once these become available.   Controlled type 2 diabetes mellitus without complication, without long-term current use of insulin (HCC)  Chronic pain syndrome  Osteoporosis without current pathological fracture, unspecified osteoporosis type   Meds ordered this encounter  Medications  . alendronate (FOSAMAX) 70 MG tablet    Sig: Take 1 tablet (70 mg total) by mouth once a week. Take with a full glass of water on an empty stomach.     Dispense:  51 tablet    Refill:  0  . gabapentin (NEURONTIN) 300 MG capsule    Sig: Take 1 capsule (300 mg total) by mouth 3 (three) times daily.    Dispense:  270 capsule    Refill:  1  . sitaGLIPtin (JANUVIA) 100 MG tablet    Sig: Take 1 tablet (100 mg total) by mouth daily.    Dispense:  90 tablet    Refill:  1  . Vitamin D, Ergocalciferol, (DRISDOL) 50000 units CAPS capsule    Sig: Take 1 capsule (50,000 Units total) by mouth every 7 (seven) days.    Dispense:  12 capsule    Refill:  3  . glimepiride (AMARYL) 4 MG tablet    Sig: Take 1 tablet (4 mg total) by mouth daily with breakfast.    Dispense:  90 tablet    Refill:  1    Patient Instructions  Plan: Let's review records and see where we are at with labs Will call you in a few weeks to set up follow-up appointment      Visit summary with medication list and pertinent instructions was printed for patient to review. All questions at time of visit were answered - patient instructed to contact office with any additional concerns. ER/RTC precautions were reviewed with the patient.   Follow-up plan: Return for recheck next few weeks depending on record review.  Note: Total time spent 45 minutes, greater than 50% of the visit was spent face-to-face counseling and coordinating care for the following: The primary encounter diagnosis was Controlled type 2 diabetes mellitus without complication, without long-term current use of insulin (HCC). Diagnoses of Chronic pain syndrome and Osteoporosis without current pathological fracture, unspecified osteoporosis type were also pertinent to this visit.[name]  Please note: voice recognition software was used to produce this document, and typos may escape review. Please contact Dr. Lyn Hollingshead for any needed clarifications.

## 2018-05-23 NOTE — Patient Instructions (Addendum)
Plan: Let's review records and see where we are at with labs Will call you in a few weeks to set up follow-up appointment

## 2018-06-25 ENCOUNTER — Telehealth: Payer: Self-pay

## 2018-06-25 NOTE — Telephone Encounter (Signed)
Sarah Kane called and states she is waiting on Dr Lyn Hollingshead to review her records and prescribe pain medication.

## 2018-06-28 ENCOUNTER — Encounter: Payer: Self-pay | Admitting: Osteopathic Medicine

## 2018-06-28 DIAGNOSIS — R768 Other specified abnormal immunological findings in serum: Secondary | ICD-10-CM | POA: Insufficient documentation

## 2018-06-28 DIAGNOSIS — G8929 Other chronic pain: Secondary | ICD-10-CM | POA: Insufficient documentation

## 2018-06-28 DIAGNOSIS — R7689 Other specified abnormal immunological findings in serum: Secondary | ICD-10-CM | POA: Insufficient documentation

## 2018-06-28 NOTE — Telephone Encounter (Signed)
Tried to call patient, no answer and voicemail is full.

## 2018-06-28 NOTE — Telephone Encounter (Signed)
Received records from previous PCP at [facility] -I did not get anything separate from a pain management clinic (I may be confused whether she actually saw pain management as it looks like Toma Copier was giving the prescriptions for the oxycodone medications).    At any rate, records were not specific with regard to plans to continue medications or not.  I do not feel comfortable prescribing opiates for long-term use in this patient, but if she would like to follow-up with me to discuss alternatives or if she would like me to place a referral to pain management, I can do that.

## 2018-07-05 ENCOUNTER — Telehealth: Payer: Self-pay

## 2018-07-05 ENCOUNTER — Encounter: Payer: Self-pay | Admitting: Osteopathic Medicine

## 2018-07-05 LAB — CHG URINE ALBUMIN SEMIQUANTITATIVE
ANA Direct: POSITIVE
MICROALB UR: 0
PTH: 87.7
RHEUMATOID ARTHRITIS FACTOR: 12
Smith Antibodies: <0.2
URIC ACID: 4.9
ss DNA Ab: 3

## 2018-07-05 NOTE — Telephone Encounter (Signed)
As per [facility] pharmacy - pt has been on generic for fosamax rx. Requesting confirmation that current rx sent is DAW1. If not, pls send a new rx as generic. Thanks.

## 2018-07-08 MED ORDER — ALENDRONATE SODIUM 70 MG PO TABS: 70.0000 mg | ORAL_TABLET | ORAL | 0 refills | Status: DC

## 2018-07-08 NOTE — Telephone Encounter (Signed)
Generic substitution is fine, erx sent

## 2018-07-09 NOTE — Telephone Encounter (Signed)
Noted  

## 2018-07-26 ENCOUNTER — Ambulatory Visit (INDEPENDENT_AMBULATORY_CARE_PROVIDER_SITE_OTHER): Payer: BLUE CROSS/BLUE SHIELD | Admitting: Osteopathic Medicine

## 2018-07-26 ENCOUNTER — Ambulatory Visit (INDEPENDENT_AMBULATORY_CARE_PROVIDER_SITE_OTHER): Payer: BLUE CROSS/BLUE SHIELD | Admitting: Family Medicine

## 2018-07-26 ENCOUNTER — Encounter: Payer: Self-pay | Admitting: Osteopathic Medicine

## 2018-07-26 ENCOUNTER — Ambulatory Visit (INDEPENDENT_AMBULATORY_CARE_PROVIDER_SITE_OTHER): Payer: BLUE CROSS/BLUE SHIELD

## 2018-07-26 ENCOUNTER — Encounter: Payer: Self-pay | Admitting: Family Medicine

## 2018-07-26 VITALS — BP 138/91 | HR 89 | Wt 174.0 lb

## 2018-07-26 VITALS — BP 138/91 | HR 89 | Temp 98.1°F | Wt 174.8 lb

## 2018-07-26 DIAGNOSIS — G8929 Other chronic pain: Secondary | ICD-10-CM

## 2018-07-26 DIAGNOSIS — M19012 Primary osteoarthritis, left shoulder: Secondary | ICD-10-CM

## 2018-07-26 DIAGNOSIS — M79642 Pain in left hand: Secondary | ICD-10-CM | POA: Diagnosis not present

## 2018-07-26 DIAGNOSIS — M720 Palmar fascial fibromatosis [Dupuytren]: Secondary | ICD-10-CM

## 2018-07-26 DIAGNOSIS — M7552 Bursitis of left shoulder: Secondary | ICD-10-CM

## 2018-07-26 DIAGNOSIS — E1165 Type 2 diabetes mellitus with hyperglycemia: Secondary | ICD-10-CM | POA: Diagnosis not present

## 2018-07-26 DIAGNOSIS — M79641 Pain in right hand: Secondary | ICD-10-CM

## 2018-07-26 DIAGNOSIS — M7502 Adhesive capsulitis of left shoulder: Secondary | ICD-10-CM | POA: Insufficient documentation

## 2018-07-26 LAB — POCT GLYCOSYLATED HEMOGLOBIN (HGB A1C): Hemoglobin A1C: 7.9 % — AB (ref 4.0–5.6)

## 2018-07-26 MED ORDER — GLIMEPIRIDE 4 MG PO TABS: 8.0000 mg | ORAL_TABLET | Freq: Every day | ORAL | 1 refills | Status: DC

## 2018-07-26 NOTE — Patient Instructions (Addendum)
Plan:  Diabetes - A1C is up above goal at 7.9  Goal 6.5-7.0  Work on strict carbohydrate avoidance  Exercise as tolerated - even short walks few days a week   Can increase Glimepiride to 8 mg daily (max dose)   If A1C not improved, will need to discuss adding medication   Orthopedic - shoulder, hands  See information from Dr Denyse Amass

## 2018-07-26 NOTE — Progress Notes (Signed)
Subjective:    I'm seeing this patient as a consultation for:  Sarah Nielsen, DO   CC: Left shoulder pain and stiffness and bilateral hand stiffness.   HPI:  Sarah Kane has a 1 year history of left shoulder pain and stiffness.  She cannot recall any specific injury.  She is been seen by medical doctors previously for this and thought to have either rotator cuff tendinitis or eventually frozen shoulder.  She had an x-ray about a half a year ago that was largely unremarkable.  She had a few episodes of physical therapy but notes it was quite expensive and she has not been able to continue going.  Additionally she has had several injections that she notes is not helped very much.  She notes pain and limitations with shoulder motion.  She has difficulty getting her hand to her hair for self-care and grooming and difficulty getting her hand over her head for work-related activities as well.  She is tried over-the-counter medicines for pain which helped a little.  Additionally she notes bilateral hand stiffness.  She has her right hand is worse than her left.  She has difficulty extending her hand fully.  This is been ongoing now for several years following a surgery for right trigger thumb.  She is had several evaluations and management strategies that have not worked.  She is tried over-the-counter medications as well as prescription NSAIDs which helped only a little.  She notes her inability to fully extend her hand has made it difficult to grasp objects at times.  Past medical history, Surgical history, Family history not pertinant except as noted below, Social history, Allergies, and medications have been entered into the medical record, reviewed, and no changes needed.   Review of Systems: No headache, visual changes, nausea, vomiting, diarrhea, constipation, dizziness, abdominal pain, skin rash, fevers, chills, night sweats, weight loss, swollen lymph nodes, body aches, joint swelling, muscle  aches, chest pain, shortness of breath, mood changes, visual or auditory hallucinations.   Objective:    Vitals:   07/26/18 0907  BP: (!) 138/91  Pulse: 89   General: Well Developed, well nourished, and in no acute distress.  Neuro/Psych: Alert and oriented x3, extra-ocular muscles intact, able to move all 4 extremities, sensation grossly intact. Skin: Warm and dry, no rashes noted.  Respiratory: Not using accessory muscles, speaking in full sentences, trachea midline.  Cardiovascular: Pulses palpable, no extremity edema. Abdomen: Does not appear distended. MSK:  C-spine: Normal-appearing nontender Normal neck motion. Left shoulder normal-appearing nontender. Significant limited range of motion.  External rotation 5 degrees beyond the neutral position. Internal rotation to iliac crest. Abduction to about 30 degrees before scapular motion.  Full abduction is only about 90 degrees. Strength is intact within range of motion. Patient unable to complete impingement testing due to range of motion limitations.  Right shoulder normal-appearing nontender normal motion.  Right hand: Palpable visible cords in the ulnar hand with limitation in extension of MCPs digits 3 4 and 5.  Left hand: Palpable visible cords in the soft tissue of the ulnar hand with again limitation in extension of MCPs 3 4 and 5.  Lab and Radiology Results Results for orders placed or performed in visit on 07/26/18 (from the past 72 hour(s))  POCT HgB A1C     Status: Abnormal   Collection Time: 07/26/18  9:56 AM  Result Value Ref Range   Hemoglobin A1C 7.9 (A) 4.0 - 5.6 %   HbA1c POC (<> result,  manual entry)     HbA1c, POC (prediabetic range)     HbA1c, POC (controlled diabetic range)     Dg Hand Complete Left  Result Date: 07/26/2018 CLINICAL DATA:  "knots" starting appearing in lt hand several mos ago. EXAM: LEFT HAND - COMPLETE 3+ VIEW COMPARISON:  None. FINDINGS: Bone mineral density is normal. No  significant erosions. Mild degenerative changes in the second proximal interphalangeal joint. No soft tissue swelling. IMPRESSION: No evidence for acute  abnormality.  Mild degenerative changes. Electronically Signed   By: Norva Pavlov M.D.   On: 07/26/2018 09:56   Dg Hand Complete Right  Result Date: 07/26/2018 CLINICAL DATA:  Unable to fully extend fingers since trigger finger release surg 19yrs ago. EXAM: RIGHT HAND - COMPLETE 3+ VIEW COMPARISON:  None. FINDINGS: In is partially flexed. Bone mineral density is normal. There is mild degenerative change at the first carpometacarpal joint. No acute fracture or subluxation. No radiopaque foreign body or soft tissue gas. IMPRESSION: First carpometacarpal joint degenerative change. No evidence for acute abnormality. Electronically Signed   By: Norva Pavlov M.D.   On: 07/26/2018 09:57  I personally (independently) visualized and performed the interpretation of the images attached in this note.   Impression and Recommendations:    Assessment and Plan: 47 y.o. female with  Shoulder pain and range of motion limitation very likely adhesive capsulitis.  I am concerned patient may have an original injury precipitating her adhesive capsulitis which is a rotator cuff tear.  At this point she has effectively failed conservative management and I think it reasonable next step is MRI for potential surgical planning.  Plan for MRI and recheck in the near future.  Extensive discussion regarding diagnosis and treatment pathway.  Hand stiffness and pain bilaterally.  Patient certainly has the appearance of Dupuytren's contractures.  I believe this is the cause of her stiffness and likely also the cause of some of her pain.  Plan for home stretching.  Discussed surgical treatment options.  Recheck in 1 month or so..   Orders Placed This Encounter  Procedures  . MR Shoulder Left Wo Contrast    Standing Status:   Future    Standing Expiration Date:   09/26/2019      Order Specific Question:   What is the patient's sedation requirement?    Answer:   No Sedation    Order Specific Question:   Does the patient have a pacemaker or implanted devices?    Answer:   No    Order Specific Question:   Preferred imaging location?    Answer:   [facility] (table limit-350lbs)    Order Specific Question:   Radiology Contrast Protocol - do NOT remove file path    Answer:   \\[email or web address]   No orders of the defined types were placed in this encounter.   Discussed warning signs or symptoms. Please see discharge instructions. Patient expresses understanding.

## 2018-07-26 NOTE — Patient Instructions (Addendum)
Thank you for coming in today.  I think you have frozen shoulder Get MRI soon.  Follow up with me after MRI to review findings and discuss next steps.  Keep working on stretching.   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  Work on stretching.   Recheck soon.     Adhesive Capsulitis Adhesive capsulitis is inflammation of the tendons and ligaments that surround the shoulder joint (shoulder capsule). This condition causes the shoulder to become stiff and painful to move. Adhesive capsulitis is also called frozen shoulder. What are the causes? This condition may be caused by:  An injury to the shoulder joint.  Straining the shoulder.  Not moving the shoulder for a period of time. This can happen if your arm was injured or in a sling.  Long-standing health problems, such as: ? Diabetes. ? Thyroid problems. ? Heart disease. ? Stroke. ? Rheumatoid arthritis. ? Lung disease.  In some cases, the cause may not be known. What increases the risk? This condition is more likely to develop in:  Women.  People who are older than 48 years of age.  What are the signs or symptoms? Symptoms of this condition include:  Pain in the shoulder when moving the arm. There may also be pain when parts of the shoulder are touched. The pain is worse at night or when at rest.  Soreness or aching in the shoulder.  Inability to move the shoulder normally.  Muscle spasms.  How is this diagnosed? This condition is diagnosed with a physical exam and imaging tests, such as an X-ray or MRI. How is this treated? This condition may be treated with:  Treatment of the underlying cause or condition.  Physical therapy. This involves performing exercises to get the shoulder moving again.  Medicine. Medicine may be given to relieve pain, inflammation, or muscle spasms.  Steroid injections into the shoulder joint.  Shoulder manipulation. This is a procedure to move the shoulder into another  position. It is done after you are given a medicine to make you fall asleep (general anesthetic). The joint may also be injected with salt water at high pressure to break down scarring.  Surgery. This may be done in severe cases when other treatments have failed.  Although most people recover completely from adhesive capsulitis, some may not regain the full movement of the shoulder. Follow these instructions at home:  Take over-the-counter and prescription medicines only as told by your health care provider.  If you are being treated with physical therapy, follow instructions from your physical therapist.  Avoid exercises that put a lot of demand on your shoulder, such as throwing. These exercises can make pain worse.  If directed, apply ice to the injured area: ? Put ice in a plastic bag. ? Place a towel between your skin and the bag. ? Leave the ice on for 20 minutes, 2-3 times per day. Contact a health care provider if:  You develop new symptoms.  Your symptoms get worse. This information is not intended to replace advice given to you by your health care provider. Make sure you discuss any questions you have with your health care provider. Document Released: 07/09/2009 Document Revised: 02/17/2016 Document Reviewed: 01/04/2015 Elsevier Interactive Patient Education  2018 [employer]    Dupuytren Contracture Dupuytren contracture is a condition in which tissue under the skin of the palm becomes abnormally thickened. This causes one or more of the fingers to curl inward (contract) toward the palm. Eventually, the fingers  may not be able to straighten out. This condition affects some or all of the fingers and the palm of the hand. It is often passed along from parent to child (inherited). Dupuytren contracture is a long-term (chronic) condition that develops (progresses) slowly over time. There is no cure, but symptoms can be managed and progression can be slowed with treatment. This  condition is usually not dangerous or painful, but it can interfere with everyday tasks. What are the causes? This condition is caused by tissue (fascia) in the palm getting thicker and tighter. When the fascia thickens, it pulls on the cords of tissue (tendons) that control finger movement. This causes the fingers to contract. The cause of fascia thickening is not known. What increases the risk? This condition may be more likely to develop in:  People who are age 46 or older.  Men.  People with a family history of this condition.  People who use tobacco products, including cigarettes, chewing tobacco, and e-cigarettes.  People who drink alcohol excessively.  People with diabetes.  People with autoimmune diseases, such as HIV.  People with seizure disorders.  What are the signs or symptoms? Symptoms may develop in one or both hands. Any of the fingers can contract. The fingers farthest from the thumb are commonly affected. Usually, this condition is painless. You may have discomfort when holding or grabbing objects. Early symptoms of this condition may include:  Thick, puckered skin on the hand.  One or more lumps (nodules) on the palm. Nodules may be tender when they first appear, but they are generally painless.  Symptoms of this condition develop slowly over months or years. Later symptoms of this condition may include:  Thick cords of tissue in the palm.  Fingers curled up toward the palm.  Inability to straighten the fingers into their normal position.  How is this diagnosed? This condition is diagnosed with a physical exam, which may include:  Looking at your hands and feeling your hands. This is to check for thickened fascia and nodules.  Measuring finger motion.  Doing the [employer]. You may be asked to try to put your hand on a surface, with your palm down and your fingers straight out.  How is this treated? There is no cure for this condition,  but treatment can make symptoms more manageable and relieve discomfort. Treatment options may include:  Physical therapy. This can strengthen your hand and increase flexibility.  Occupational therapy. This can help you with everyday tasks that may be more difficult because of your condition.  A hand splint.  Shots (injections). Substances may be injected into your hand, such as: ? Medicines that help to decrease swelling (corticosteroids). ? Proteins (collagenase) to weaken thick tissue. After a collagenase injection, your health care provider may stretch your fingers.  Needle aponeurotomy. In this procedure, a needle is pushed through the skin and into the fascia. Moving the needle against the fascia can weaken or break up the thick tissue.  Surgery. This may be needed if your condition causes discomfort or interferes with everyday activities. Physical therapy is usually needed after surgery.  In some cases, symptoms never develop to the point of needing major treatment, and caring for yourself at home can be enough to manage your condition. Symptoms often return after treatment. Follow these instructions at home: If you have a splint:  Do not put pressure on any part of the splint until it is fully hardened. This may take several hours.  Wear  the splint as told by your health care provider. Remove it only as told by your health care provider.  Loosen the splint if your fingers tingle, become numb, or turn cold and blue.  Do not let your splint get wet if it is not waterproof. ? If your splint is not waterproof, cover it with a watertight covering when you take a bath or a shower. ? Do not take baths, swim, or use a hot tub until your health care provider approves. Ask your health care provider if you can take showers. You may only be allowed to take sponge baths for bathing.  Keep the splint clean.  Ask your health care provider when it is safe to drive. Hand Care  Take these  actions to help protect your hand from possible injury: ? Use tools that have padded grips. ? Wear protective gloves while you work with your hands. ? Avoid repetitive hand movements.  Avoid actions that cause pain or discomfort.  Stretch your hand by gently pulling your fingers backward toward your wrist. Do this as often as is comfortable. Stop if this causes pain.  Gently massage your hand as often as is comfortable.  If directed, apply heat to the affected area as often as told by your health care provider. Use the heat source that your health care provider recommends, such as a moist heat pack or a heating pad. ? Place a towel between your skin and the heat source. ? Leave the heat on for 20-30 minutes. ? Remove the heat if your skin turns bright red. This is especially important if you are unable to feel pain, heat, or cold. You may have a greater risk of getting burned. General instructions  Take over-the-counter and prescription medicines only as told by your health care provider.  Manage any other conditions that you have, such as diabetes.  If physical therapy was prescribed, do exercises as told by your health care provider.  Keep all follow-up visits as told by your health care provider. This is important. Contact a health care provider if:  You develop new symptoms, or your symptoms get worse.  You have pain that gets worse or does not get better with medicine.  You have difficulty or discomfort with everyday tasks.  You have problems with your splint.  You develop numbness or tingling. Get help right away if:  You have severe pain.  Your fingers change color or become unusually cold. This information is not intended to replace advice given to you by your health care provider. Make sure you discuss any questions you have with your health care provider. Document Released: 07/09/2009 Document Revised: 10/26/2015 Document Reviewed: 02/03/2015 Elsevier Interactive  Patient Education  [employer].

## 2018-07-26 NOTE — Progress Notes (Signed)
HPI: Sarah Kane is a 47 y.o. female who  has a past medical history of Arthritis and Diabetes mellitus without complication (HCC).  she presents to [facility] today, 07/26/18,  for chief complaint of:  DM2 Chronic pain  Records reviewed from [facility].  As of most recent visit 04/02/2018 no mention of plans to continue or discontinue opiate pain medications.  She was reportedly discharged from this practice, notes are not specific about the reason.  Oxycodone 10 mg as well as Percocet 5-325 were on her medication list but it looks like the Percocet is the one that was continued as of 03/27/2018, or 60 for 30 days.  PMP reviewed.  Opiates were initially started 02/25/2018, referrals were also made to neurology, neurosurgery, and physical therapy.  She is seeing sports medicine today for chronic pain issues particularly of the shoulder and bilateral hands.  Diabetes follow-up: Has been just over 3 months since last A1c checked.  She finds exercise difficult due to musculoskeletal issues.  Tolerates current medications well     Past medical history, surgical history, and family history reviewed.  Current medication list and allergy/intolerance information reviewed.   (See remainder of HPI, ROS, Phys Exam below)  Dg Hand Complete Left  Result Date: 07/26/2018 CLINICAL DATA:  "knots" starting appearing in lt hand several mos ago. EXAM: LEFT HAND - COMPLETE 3+ VIEW COMPARISON:  None. FINDINGS: Bone mineral density is normal. No significant erosions. Mild degenerative changes in the second proximal interphalangeal joint. No soft tissue swelling. IMPRESSION: No evidence for acute  abnormality.  Mild degenerative changes. Electronically Signed   By: Norva Pavlov M.D.   On: 07/26/2018 09:56   Dg Hand Complete Right  Result Date: 07/26/2018 CLINICAL DATA:  Unable to fully extend fingers since trigger finger release surg 37yrs ago. EXAM: RIGHT HAND -  COMPLETE 3+ VIEW COMPARISON:  None. FINDINGS: In is partially flexed. Bone mineral density is normal. There is mild degenerative change at the first carpometacarpal joint. No acute fracture or subluxation. No radiopaque foreign body or soft tissue gas. IMPRESSION: First carpometacarpal joint degenerative change. No evidence for acute abnormality. Electronically Signed   By: Norva Pavlov M.D.   On: 07/26/2018 09:57    Results for orders placed or performed in visit on 07/26/18 (from the past 72 hour(s))  POCT HgB A1C     Status: Abnormal   Collection Time: 07/26/18  9:56 AM  Result Value Ref Range   Hemoglobin A1C 7.9 (A) 4.0 - 5.6 %   HbA1c POC (<> result, manual entry)     HbA1c, POC (prediabetic range)     HbA1c, POC (controlled diabetic range)        ASSESSMENT/PLAN:   Type 2 diabetes mellitus with hyperglycemia, without long-term current use of insulin (HCC) - Plan: POCT HgB A1C  Bilateral hand pain - Plan: DG Hand Complete Right, DG Hand Complete Left  Arthritis of left acromioclavicular joint  Subacromial bursitis of left shoulder joint  Other chronic pain   Meds ordered this encounter  Medications  . glimepiride (AMARYL) 4 MG tablet    Sig: Take 2 tablets (8 mg total) by mouth daily with breakfast.    Dispense:  180 tablet    Refill:  1    Patient Instructions  Plan:  Diabetes - A1C is up above goal at 7.9  Goal 6.5-7.0  Work on strict carbohydrate avoidance  Exercise as tolerated - even short walks few days  a week   Can increase Glimepiride to 8 mg daily (max dose)   If A1C not improved, will need to discuss adding medication   Orthopedic - shoulder, hands  See information from Dr Denyse Amass    Follow-up plan: Return in about 3 months (around 10/26/2018) for recheck A1C / diabetes .                                    ############################################ ############################################ ############################################ ############################################    Outpatient Encounter Medications as of 07/26/2018  Medication Sig  . alendronate (FOSAMAX) 70 MG tablet Take 1 tablet (70 mg total) by mouth once a week. Take with a full glass of water on an empty stomach. Please fill generic Rx  . gabapentin (NEURONTIN) 300 MG capsule Take 1 capsule (300 mg total) by mouth 3 (three) times daily.  [name] glimepiride (AMARYL) 4 MG tablet Take 1 tablet (4 mg total) by mouth daily with breakfast.  . sitaGLIPtin (JANUVIA) 100 MG tablet Take 1 tablet (100 mg total) by mouth daily.  . Vitamin D, Ergocalciferol, (DRISDOL) 50000 units CAPS capsule Take 1 capsule (50,000 Units total) by mouth every 7 (seven) days.  . celecoxib (CELEBREX) 200 MG capsule 1 capsule.   No facility-administered encounter medications on file as of 07/26/2018.    No Known Allergies    Review of Systems:  Constitutional: No recent illness  HEENT: No  headache, no vision change  Cardiac: No  chest pain, No  pressure, No palpitations  Respiratory:  No  shortness of breath. No  Cough  Gastrointestinal: No  abdominal pain, no change on bowel habits  Musculoskeletal: +myalgia/arthralgia  Neurologic: No  weakness, No  Dizziness   Exam:  BP (!) 140/99 (BP Location: Left Arm, Patient Position: Sitting, Cuff Size: Normal)   Pulse 83   Temp 98.1 F (36.7 C) (Oral)   Wt 174 lb 12.8 oz (79.3 kg)   BMI 34.14 kg/m   Constitutional: VS see above. General Appearance: alert, well-developed, well-nourished, NAD  Eyes: Normal lids and conjunctive, non-icteric sclera  Ears, Nose, Mouth, Throat: MMM, Normal external inspection ears/nares/mouth/lips/gums.  Neck: No masses, trachea midline.   Respiratory: Normal respiratory effort.    Musculoskeletal: Gait normal. Symmetric and independent movement of all extremities  Neurological: Normal balance/coordination. No tremor.  Skin: warm, dry, intact.   Psychiatric: Normal judgment/insight. Normal mood and affect. Oriented x3.   Visit summary with medication list and pertinent instructions was printed for patient to review, advised to alert [place] if any changes needed. All questions at time of visit were answered - patient instructed to contact office with any additional concerns. ER/RTC precautions were reviewed with the patient and understanding verbalized.   Follow-up plan: Return in about 3 months (around 10/26/2018) for recheck A1C / diabetes .    Please note: voice recognition software was used to produce this document, and typos may escape review. Please contact Dr. Lyn Hollingshead for any needed clarifications.

## 2018-08-12 ENCOUNTER — Ambulatory Visit (INDEPENDENT_AMBULATORY_CARE_PROVIDER_SITE_OTHER): Payer: BLUE CROSS/BLUE SHIELD

## 2018-08-12 DIAGNOSIS — M7552 Bursitis of left shoulder: Secondary | ICD-10-CM | POA: Diagnosis not present

## 2018-08-12 DIAGNOSIS — M7502 Adhesive capsulitis of left shoulder: Secondary | ICD-10-CM

## 2018-08-14 ENCOUNTER — Encounter: Payer: Self-pay | Admitting: Family Medicine

## 2018-08-14 ENCOUNTER — Ambulatory Visit (INDEPENDENT_AMBULATORY_CARE_PROVIDER_SITE_OTHER): Payer: BLUE CROSS/BLUE SHIELD | Admitting: Family Medicine

## 2018-08-14 VITALS — BP 140/92 | HR 91 | Wt 179.0 lb

## 2018-08-14 DIAGNOSIS — M7502 Adhesive capsulitis of left shoulder: Secondary | ICD-10-CM | POA: Diagnosis not present

## 2018-08-14 DIAGNOSIS — M75100 Unspecified rotator cuff tear or rupture of unspecified shoulder, not specified as traumatic: Secondary | ICD-10-CM

## 2018-08-14 DIAGNOSIS — M75102 Unspecified rotator cuff tear or rupture of left shoulder, not specified as traumatic: Secondary | ICD-10-CM | POA: Insufficient documentation

## 2018-08-14 NOTE — Patient Instructions (Signed)
Thank you for coming in today. Return Friday for shoulder injection.    Adhesive Capsulitis Adhesive capsulitis is inflammation of the tendons and ligaments that surround the shoulder joint (shoulder capsule). This condition causes the shoulder to become stiff and painful to move. Adhesive capsulitis is also called frozen shoulder. What are the causes? This condition may be caused by:  An injury to the shoulder joint.  Straining the shoulder.  Not moving the shoulder for a period of time. This can happen if your arm was injured or in a sling.  Long-standing health problems, such as: ? Diabetes. ? Thyroid problems. ? Heart disease. ? Stroke. ? Rheumatoid arthritis. ? Lung disease.  In some cases, the cause may not be known. What increases the risk? This condition is more likely to develop in:  Women.  People who are older than 48 years of age.  What are the signs or symptoms? Symptoms of this condition include:  Pain in the shoulder when moving the arm. There may also be pain when parts of the shoulder are touched. The pain is worse at night or when at rest.  Soreness or aching in the shoulder.  Inability to move the shoulder normally.  Muscle spasms.  How is this diagnosed? This condition is diagnosed with a physical exam and imaging tests, such as an X-ray or MRI. How is this treated? This condition may be treated with:  Treatment of the underlying cause or condition.  Physical therapy. This involves performing exercises to get the shoulder moving again.  Medicine. Medicine may be given to relieve pain, inflammation, or muscle spasms.  Steroid injections into the shoulder joint.  Shoulder manipulation. This is a procedure to move the shoulder into another position. It is done after you are given a medicine to make you fall asleep (general anesthetic). The joint may also be injected with salt water at high pressure to break down scarring.  Surgery. This may be  done in severe cases when other treatments have failed.  Although most people recover completely from adhesive capsulitis, some may not regain the full movement of the shoulder. Follow these instructions at home:  Take over-the-counter and prescription medicines only as told by your health care provider.  If you are being treated with physical therapy, follow instructions from your physical therapist.  Avoid exercises that put a lot of demand on your shoulder, such as throwing. These exercises can make pain worse.  If directed, apply ice to the injured area: ? Put ice in a plastic bag. ? Place a towel between your skin and the bag. ? Leave the ice on for 20 minutes, 2-3 times per day. Contact a health care provider if:  You develop new symptoms.  Your symptoms get worse. This information is not intended to replace advice given to you by your health care provider. Make sure you discuss any questions you have with your health care provider. Document Released: 07/09/2009 Document Revised: 02/17/2016 Document Reviewed: 01/04/2015 Elsevier Interactive Patient Education  [employer].

## 2018-08-14 NOTE — Progress Notes (Signed)
Sarah Kane is a 47 y.o. female who presents to [facility] today for left shoulder pain follow-up.  Patient was seen on November 1 for left shoulder pain thought to be adhesive capsulitis with possible rotator cuff tendinitis or tear underlying the adhesive capsulitis.  She had MRI and is here for follow-up.  MRI was degraded by motion artifact but did show supraspinatus tendinopathy with possible incomplete tear.  Sarah Kane would like to avoid surgery if possible and is proceed with home exercise program.  She notes it is very painful especially in the evening and she is having trouble completing her exercises due to pain.  No radiating pain weakness or numbness distally.    ROS:  As above  Exam:  BP (!) 140/92   Pulse 91   Wt 179 lb (81.2 kg)   BMI 34.96 kg/m  General: Well Developed, well nourished, and in no acute distress.  Neuro/Psych: Alert and oriented x3, extra-ocular muscles intact, able to move all 4 extremities, sensation grossly intact. Skin: Warm and dry, no rashes noted.  Respiratory: Not using accessory muscles, speaking in full sentences, trachea midline.  Cardiovascular: Pulses palpable, no extremity edema. Abdomen: Does not appear distended. MSK: Left shoulder normal-appearing nontender decreased motion capillary refill and sensation are intact distally.    Lab and Radiology Results No results found for this or any previous visit (from the past 72 hour(s)). Mr Shoulder Left Wo Contrast  Result Date: 08/12/2018 CLINICAL DATA:  Right shoulder pain for 6 months. Limited range of motion. EXAM: MRI OF THE LEFT SHOULDER WITHOUT CONTRAST TECHNIQUE: Multiplanar, multisequence MR imaging of the shoulder was performed. No intravenous contrast was administered. COMPARISON:  None. FINDINGS: Exam limited due to patient motion. Patient could not hold still for the exam. Rotator cuff: Moderate to significant rotator cuff  tendinopathy/tendinosis mainly involving the supraspinatus tendon. There is an oblique coursing interstitial tear which may contact the articular surface at the footprint attachment region but no obvious full-thickness retracted tear. The infraspinatus and subscapularis tendons are intact with moderate tendinopathy. Muscles:  Grossly normal. Biceps long head:  Intact Acromioclavicular Joint: Moderate degenerative changes. Type 1-2 acromion. No lateral downsloping or undersurface spurring. Glenohumeral Joint: Mild degenerative changes. Small joint effusion and mild synovitis versus adhesive capsulitis. Labrum:  No definite labral tears. Bones:  No acute bony findings. Other: Mild to moderate subacromial/subdeltoid bursitis. IMPRESSION: 1. Limited examination due to motion. 2. Moderate to significant rotator cuff tendinopathy/tendinosis with a long oblique coursing interstitial tear involving the supraspinatus tendon which could contact the articular surface at the footprint attachment region. No definite full-thickness retracted tear. 3. Intact long head biceps tendon and no definite labral tears. 4. Mild to moderate subacromial/subdeltoid bursitis. Electronically Signed   By: Rudie Meyer M.D.   On: 08/12/2018 10:10   I personally (independently) visualized and performed the interpretation of the images attached in this note.     Assessment and Plan: 47 y.o. female with left shoulder pain with adhesive capsulitis with rotator cuff tendinopathy and possible supraspinatus tear and complete.  Discussed options.  Plan for trial of glenohumeral injection.  This will be followed by dedicated home exercise program.  Discussed risks and benefits.  Patient would like to delay the injection until Friday if she does not have to work Friday evening.  Return on Friday for injection.  Return sooner if needed.  I spent 25 minutes with this patient, greater than 50% was face-to-face time counseling regarding ddx  and  plan.   No orders of the defined types were placed in this encounter.  No orders of the defined types were placed in this encounter.   Historical information moved to improve visibility of documentation.  Past Medical History:  Diagnosis Date  . Arthritis   . Diabetes mellitus without complication [facility])    Past Surgical History:  Procedure Laterality Date  . CESAREAN SECTION  1990  . TRIGGER FINGER RELEASE Right    thumb   Social History   Tobacco Use  . Smoking status: Never Smoker  . Smokeless tobacco: Never Used  Substance Use Topics  . Alcohol use: No   family history includes Diabetes in her maternal aunt, maternal grandfather, maternal grandmother, maternal uncle, and mother; Heart attack in her maternal grandfather; High blood pressure in her maternal grandfather, maternal grandmother, mother, paternal grandfather, and paternal grandmother; Skin cancer in her maternal uncle.  Medications: Current Outpatient Medications  Medication Sig Dispense Refill  . alendronate (FOSAMAX) 70 MG tablet Take 1 tablet (70 mg total) by mouth once a week. Take with a full glass of water on an empty stomach. Please fill generic Rx 51 tablet 0  . celecoxib (CELEBREX) 200 MG capsule 1 capsule.  3  . gabapentin (NEURONTIN) 300 MG capsule Take 1 capsule (300 mg total) by mouth 3 (three) times daily. 270 capsule 1  . glimepiride (AMARYL) 4 MG tablet Take 2 tablets (8 mg total) by mouth daily with breakfast. 180 tablet 1  . sitaGLIPtin (JANUVIA) 100 MG tablet Take 1 tablet (100 mg total) by mouth daily. 90 tablet 1  . Vitamin D, Ergocalciferol, (DRISDOL) 50000 units CAPS capsule Take 1 capsule (50,000 Units total) by mouth every 7 (seven) days. 12 capsule 3   No current facility-administered medications for this visit.    No Known Allergies    Discussed warning signs or symptoms. Please see discharge instructions. Patient expresses understanding.

## 2018-08-16 ENCOUNTER — Ambulatory Visit (INDEPENDENT_AMBULATORY_CARE_PROVIDER_SITE_OTHER): Payer: BLUE CROSS/BLUE SHIELD | Admitting: Family Medicine

## 2018-08-16 ENCOUNTER — Encounter: Payer: Self-pay | Admitting: Family Medicine

## 2018-08-16 VITALS — BP 148/97 | HR 95 | Temp 98.0°F | Wt 177.9 lb

## 2018-08-16 DIAGNOSIS — M7502 Adhesive capsulitis of left shoulder: Secondary | ICD-10-CM

## 2018-08-16 NOTE — Patient Instructions (Addendum)
Thank you for coming in today. Work on shoulder range of motion.  The pain should start settling down in a few days.  Let me know how you are doing.   Call or go to the ER if you develop a large red swollen joint with extreme pain or oozing puss.    Adhesive Capsulitis Adhesive capsulitis is inflammation of the tendons and ligaments that surround the shoulder joint (shoulder capsule). This condition causes the shoulder to become stiff and painful to move. Adhesive capsulitis is also called frozen shoulder. What are the causes? This condition may be caused by:  An injury to the shoulder joint.  Straining the shoulder.  Not moving the shoulder for a period of time. This can happen if your arm was injured or in a sling.  Long-standing health problems, such as: ? Diabetes. ? Thyroid problems. ? Heart disease. ? Stroke. ? Rheumatoid arthritis. ? Lung disease.  In some cases, the cause may not be known. What increases the risk? This condition is more likely to develop in:  Women.  People who are older than 47 years of age.  What are the signs or symptoms? Symptoms of this condition include:  Pain in the shoulder when moving the arm. There may also be pain when parts of the shoulder are touched. The pain is worse at night or when at rest.  Soreness or aching in the shoulder.  Inability to move the shoulder normally.  Muscle spasms.  How is this diagnosed? This condition is diagnosed with a physical exam and imaging tests, such as an X-ray or MRI. How is this treated? This condition may be treated with:  Treatment of the underlying cause or condition.  Physical therapy. This involves performing exercises to get the shoulder moving again.  Medicine. Medicine may be given to relieve pain, inflammation, or muscle spasms.  Steroid injections into the shoulder joint.  Shoulder manipulation. This is a procedure to move the shoulder into another position. It is done after  you are given a medicine to make you fall asleep (general anesthetic). The joint may also be injected with salt water at high pressure to break down scarring.  Surgery. This may be done in severe cases when other treatments have failed.  Although most people recover completely from adhesive capsulitis, some may not regain the full movement of the shoulder. Follow these instructions at home:  Take over-the-counter and prescription medicines only as told by your health care provider.  If you are being treated with physical therapy, follow instructions from your physical therapist.  Avoid exercises that put a lot of demand on your shoulder, such as throwing. These exercises can make pain worse.  If directed, apply ice to the injured area: ? Put ice in a plastic bag. ? Place a towel between your skin and the bag. ? Leave the ice on for 20 minutes, 2-3 times per day. Contact a health care provider if:  You develop new symptoms.  Your symptoms get worse. This information is not intended to replace advice given to you by your health care provider. Make sure you discuss any questions you have with your health care provider. Document Released: 07/09/2009 Document Revised: 02/17/2016 Document Reviewed: 01/04/2015 Elsevier Interactive Patient Education  [employer].

## 2018-08-16 NOTE — Progress Notes (Signed)
Patient returns to clinic today for previously arranged left shoulder glenohumeral injection for adhesive capsulitis.  Procedure: Real-time Ultrasound Guided Injection of left shoulder glenohumeral joint Device: GE Logiq E   Images permanently stored and available for review in the ultrasound unit. Verbal informed consent obtained.  Discussed risks and benefits of procedure. Warned about infection bleeding damage to structures skin hypopigmentation and fat atrophy among others. Patient expresses understanding and agreement Time-out conducted.   Noted no overlying erythema, induration, or other signs of local infection.   Skin prepped in a sterile fashion.   Local anesthesia: Topical Ethyl chloride.   With sterile technique and under real time ultrasound guidance:  8 mL of Marcaine and 40 mg of Kenalog injected easily.   Completed without difficulty   Pain partially resolved suggesting accurate placement of the medication.   Advised to call if fevers/chills, erythema, induration, drainage, or persistent bleeding.   Images permanently stored and available for review in the ultrasound unit.  Impression: Technically successful ultrasound guided injection.    Instructed patient on follow-up plan and home exercise program.

## 2018-10-29 ENCOUNTER — Encounter: Payer: Self-pay | Admitting: Osteopathic Medicine

## 2018-10-29 ENCOUNTER — Ambulatory Visit (INDEPENDENT_AMBULATORY_CARE_PROVIDER_SITE_OTHER): Payer: BLUE CROSS/BLUE SHIELD | Admitting: Osteopathic Medicine

## 2018-10-29 VITALS — BP 165/105 | HR 91 | Temp 98.4°F | Wt 185.5 lb

## 2018-10-29 DIAGNOSIS — E1165 Type 2 diabetes mellitus with hyperglycemia: Secondary | ICD-10-CM | POA: Diagnosis not present

## 2018-10-29 DIAGNOSIS — I10 Essential (primary) hypertension: Secondary | ICD-10-CM | POA: Diagnosis not present

## 2018-10-29 DIAGNOSIS — M7502 Adhesive capsulitis of left shoulder: Secondary | ICD-10-CM

## 2018-10-29 LAB — POCT GLYCOSYLATED HEMOGLOBIN (HGB A1C): Hemoglobin A1C: 9.2 % — AB (ref 4.0–5.6)

## 2018-10-29 MED ORDER — GABAPENTIN 300 MG PO CAPS: 300.0000 mg | ORAL_CAPSULE | Freq: Three times a day (TID) | ORAL | 1 refills | Status: DC

## 2018-10-29 NOTE — Progress Notes (Signed)
HPI: Sarah Kane is a 48 y.o. female who  has a past medical history of Arthritis and Diabetes mellitus without complication (HCC).  she presents to [facility] today, 10/29/18,  for chief complaint of:  DM2 follow-up  A1C poorly controlled. 9.2 today Was 7.9 last visit 07/26/2018 Reports difficulty w/ exercise d/t MSK pain Reports incomplete adherence to low-carb diet  We increased glimepiride to 8 mg max dose  Also on Januvia 100 mg daily  Was on Ozempic but then Trulicity but not taking these  HTN poorly controlled, pt reports pain and stress lately, though shoulder is doing better. Declines Rx adjustment   Shoulder: had been following with Dr Denyse Amassorey for adhesive capsulitis, s/p injection 08/17/2019     Results for orders placed or performed in visit on 10/29/18 (from the past 24 hour(s))  POCT HgB A1C     Status: Abnormal   Collection Time: 10/29/18  9:33 AM  Result Value Ref Range   Hemoglobin A1C 9.2 (A) 4.0 - 5.6 %   HbA1c POC (<> result, manual entry)     HbA1c, POC (prediabetic range)     HbA1c, POC (controlled diabetic range)          At today's visit 10/29/18 ... PMH, PSH, FH reviewed and updated as needed.  Current medication list and allergy/intolerance hx reviewed and updated as needed. (See remainder of HPI, ROS, Phys Exam below)          ASSESSMENT/PLAN: The primary encounter diagnosis was Type 2 diabetes mellitus with hyperglycemia, without long-term current use of insulin (HCC). Diagnoses of Adhesive capsulitis of left shoulder and Essential hypertension were also pertinent to this visit.  Pt declined med adjustment and does not want to restart Trulicity at this time. Advised of dangers of not being at goal for A1C, BP. Pt would like to give it 3 mos w/ lifestyle changes and go from there.   Orders Placed This Encounter  Procedures  . POCT HgB A1C     Meds ordered this encounter  Medications  . DISCONTD:  gabapentin (NEURONTIN) 300 MG capsule    Sig: Take 1 capsule (300 mg total) by mouth 3 (three) times daily.    Dispense:  270 capsule    Refill:  1  . gabapentin (NEURONTIN) 300 MG capsule    Sig: Take 1 capsule (300 mg total) by mouth 3 (three) times daily.    Dispense:  270 capsule    Refill:  1       Follow-up plan: Return in about 3 months (around 01/27/2019) for A1C AND BP RECHECK - if not better, will need t oadjust meds .                                                 ################################################# ################################################# ################################################# #################################################    Current Meds  Medication Sig  . alendronate (FOSAMAX) 70 MG tablet Take 1 tablet (70 mg total) by mouth once a week. Take with a full glass of water on an empty stomach. Please fill generic Rx  . celecoxib (CELEBREX) 200 MG capsule 1 capsule.  [name]. glimepiride (AMARYL) 4 MG tablet Take 2 tablets (8 mg total) by mouth daily with breakfast.  . sitaGLIPtin (JANUVIA) 100 MG tablet Take 1 tablet (100 mg total) by mouth daily.  . Vitamin D, Ergocalciferol, (  DRISDOL) 50000 units CAPS capsule Take 1 capsule (50,000 Units total) by mouth every 7 (seven) days.    No Known Allergies     Review of Systems:  Constitutional: No recent illness  HEENT: No  headache, no vision change  Cardiac: No  chest pain, No  pressure, No palpitations  Respiratory:  No  shortness of breath. No  Cough  Gastrointestinal: No  abdominal pain, no change on bowel habits  Musculoskeletal: No new myalgia/arthralgia  Skin: No  Rash  Neurologic: No  weakness, No  Dizziness  Psychiatric: No  concerns with depression, No  concerns with anxiety  Exam:  BP (!) 165/105 (BP Location: Left Arm, Patient Position: Sitting, Cuff Size: Normal)   Pulse 91   Temp 98.4 F (36.9 C) (Oral)   Wt 185  lb 8 oz (84.1 kg)   BMI 36.23 kg/m   Constitutional: VS see above. General Appearance: alert, well-developed, well-nourished, NAD  Eyes: Normal lids and conjunctive, non-icteric sclera  Ears, Nose, Mouth, Throat: MMM, Normal external inspection ears/nares/mouth/lips/gums.  Neck: No masses, trachea midline.   Respiratory: Normal respiratory effort. no wheeze, no rhonchi, no rales  Cardiovascular: S1/S2 normal, no murmur, no rub/gallop auscultated. RRR.   Musculoskeletal: Gait normal. Symmetric and independent movement of all extremities   Neurological: Normal balance/coordination. No tremor.  Skin: warm, dry, intact.   Psychiatric: Normal judgment/insight. Normal mood and affect. Oriented x3.       Visit summary with medication list and pertinent instructions was printed for patient to review, patient was advised to alert [place] if any updates are needed. All questions at time of visit were answered - patient instructed to contact office with any additional concerns. ER/RTC precautions were reviewed with the patient and understanding verbalized.   Note: Total time spent 25 minutes, greater than 50% of the visit was spent face-to-face counseling and coordinating care for the following: The primary encounter diagnosis was Type 2 diabetes mellitus with hyperglycemia, without long-term current use of insulin (HCC). Diagnoses of Adhesive capsulitis of left shoulder and Essential hypertension were also pertinent to this visit.[name]  Please note: voice recognition software was used to produce this document, and typos may escape review. Please contact Dr. Lyn Hollingshead for any needed clarifications.    Follow up plan: Return in about 3 months (around 01/27/2019) for A1C AND BP RECHECK - if not better, will need t oadjust meds .

## 2019-01-21 ENCOUNTER — Encounter: Payer: Self-pay | Admitting: Osteopathic Medicine

## 2019-01-21 ENCOUNTER — Ambulatory Visit: Payer: BLUE CROSS/BLUE SHIELD | Admitting: Osteopathic Medicine

## 2019-01-21 VITALS — BP 136/92 | HR 88 | Temp 98.2°F | Ht 60.0 in | Wt 187.0 lb

## 2019-01-21 DIAGNOSIS — E1165 Type 2 diabetes mellitus with hyperglycemia: Secondary | ICD-10-CM | POA: Diagnosis not present

## 2019-01-21 LAB — POCT GLYCOSYLATED HEMOGLOBIN (HGB A1C)
HbA1c, POC (controlled diabetic range): 10.1 % — AB (ref 0.0–7.0)
Hemoglobin A1C: 10.1 % — AB (ref 4.0–5.6)

## 2019-01-21 MED ORDER — SEMAGLUTIDE(0.25 OR 0.5MG/DOS) 2 MG/1.5ML ~~LOC~~ SOPN: 0.5000 mg | PEN_INJECTOR | SUBCUTANEOUS | 1 refills | Status: DC

## 2019-01-21 NOTE — Patient Instructions (Signed)
Plan: Adding back the Ozempic. If A1C not much better, may increase the Ozepmic and/or consider starting insulin.

## 2019-01-21 NOTE — Progress Notes (Signed)
HPI: Sarah Kane is a 48 y.o. female who  has a past medical history of Arthritis and Diabetes mellitus without complication (HCC).  she presents to [facility] today, 01/21/19,  for chief complaint of:  DM2 follow-up   DIABETES SCREENING/PREVENTIVE CARE: Antihyperglycemic medications as of today, 01/21/19:  Januvia/sitagliptin 100 mg daily  Amaryl/glimepiride 8 mg daily  A1C past 3-6 mos: Yes  controlled? No   07/2018: 7.9  10/2018: 9.2  Today, 01/21/19: 10.5 BP goal <130/80: close but not quite, though better than it's been BP Readings from Last 3 Encounters:  01/21/19 (!) 136/92  10/29/18 (!) 165/105  08/16/18 (!) 148/97  LDL goal <70: no, was 103 in 03/2018 Eye exam annually: none on file, importance discussed with patient Foot exam: No  Microalbuminuria:done 06/2018 and neg Metformin: intolerant ACE/ARB: has declined Antiplatelet if ASCVD Risk >10%: No  Statin: has declined Pneumovax: Yes   Immunization History  Administered Date(s) Administered  . Pneumococcal Polysaccharide-23 10/02/2016        At today's visit 01/21/19 ... PMH, PSH, FH reviewed and updated as needed.  Current medication list and allergy/intolerance hx reviewed and updated as needed. (See remainder of HPI, ROS, Phys Exam below)   No results found.  Results for orders placed or performed in visit on 01/21/19 (from the past 72 hour(s))  POCT HgB A1C     Status: Abnormal   Collection Time: 01/21/19  7:57 AM  Result Value Ref Range   Hemoglobin A1C 10.1 (A) 4.0 - 5.6 %   HbA1c POC (<> result, manual entry)     HbA1c, POC (prediabetic range)     HbA1c, POC (controlled diabetic range) 10.1 (A) 0.0 - 7.0 %          ASSESSMENT/PLAN: The encounter diagnosis was Type 2 diabetes mellitus with hyperglycemia, without long-term current use of insulin (HCC).   Orders Placed This Encounter  Procedures  . POCT HgB A1C     Meds ordered this encounter   Medications  . DISCONTD: Semaglutide,0.25 or 0.5MG /DOS, (OZEMPIC, 0.25 OR 0.5 MG/DOSE,) 2 MG/1.5ML SOPN    Sig: Inject 0.5 mg into the skin once a week.    Dispense:  18 mL    Refill:  1    Please run w/ savings card 30 day supply ok if pt desires  . Semaglutide,0.25 or 0.5MG /DOS, (OZEMPIC, 0.25 OR 0.5 MG/DOSE,) 2 MG/1.5ML SOPN    Sig: Inject 0.5 mg into the skin once a week.    Dispense:  18 mL    Refill:  1    Please run w/ savings card 30 day supply ok if pt desires    Patient Instructions  Plan: Adding back the Ozempic. If A1C not much better, may increase the Ozepmic and/or consider starting insulin.      Follow-up plan: Return in about 3 months (around 04/22/2019) for A1C recheck, see me sooner if needed.                                                 ################################################# ################################################# ################################################# #################################################    No outpatient medications have been marked as taking for the 01/21/19 encounter (Appointment) with Sunnie NielsenAlexander, Maverik Foot, DO.    No Known Allergies     Review of Systems:  Constitutional: No recent illness  HEENT: No  headache, no vision  change  Cardiac: No  chest pain, No  pressure, No palpitations  Respiratory:  No  shortness of breath. No  Cough  Gastrointestinal: No  abdominal pain, no change on bowel habits  Neurologic: No  weakness, No  Dizziness  Psychiatric: No  concerns with depression, No  concerns with anxiety  Exam:  BP (!) 136/92   Pulse 88   Temp 98.2 F (36.8 C) (Oral)   Ht 5' (1.524 m)   Wt 187 lb (84.8 kg)   SpO2 100%   BMI 36.52 kg/m   Constitutional: VS see above. General Appearance: alert, well-developed, well-nourished, NAD  Eyes: Normal lids and conjunctive, non-icteric sclera  Ears, Nose, Mouth, Throat: MMM, Normal external  inspection ears/nares/mouth/lips/gums.  Neck: No masses, trachea midline.   Respiratory: Normal respiratory effort.   Musculoskeletal: Gait normal. Symmetric and independent movement of all extremities  Neurological: Normal balance/coordination. No tremor.  Skin: warm, dry, intact.   Psychiatric: Normal judgment/insight. Normal mood and affect. Oriented x3.       Visit summary with medication list and pertinent instructions was printed for patient to review, patient was advised to alert [place] if any updates are needed. All questions at time of visit were answered - patient instructed to contact office with any additional concerns. ER/RTC precautions were reviewed with the patient and understanding verbalized.   Note: Total time spent 25 minutes, greater than 50% of the visit was spent face-to-face counseling and coordinating care for the following: The encounter diagnosis was Type 2 diabetes mellitus with hyperglycemia, without long-term current use of insulin (HCC)..  Please note: voice recognition software was used to produce this document, and typos may escape review. Please contact Dr. Lyn Hollingshead for any needed clarifications.    Follow up plan: Return in about 3 months (around 04/22/2019) for A1C recheck, see me sooner if needed.

## 2019-01-28 ENCOUNTER — Ambulatory Visit: Payer: Self-pay | Admitting: Osteopathic Medicine

## 2019-04-15 ENCOUNTER — Other Ambulatory Visit: Payer: Self-pay | Admitting: Osteopathic Medicine

## 2019-04-22 ENCOUNTER — Other Ambulatory Visit: Payer: Self-pay

## 2019-04-22 ENCOUNTER — Encounter: Payer: Self-pay | Admitting: Osteopathic Medicine

## 2019-04-22 ENCOUNTER — Ambulatory Visit (INDEPENDENT_AMBULATORY_CARE_PROVIDER_SITE_OTHER): Payer: BC Managed Care – PPO | Admitting: Osteopathic Medicine

## 2019-04-22 VITALS — BP 135/98 | HR 82 | Wt 181.0 lb

## 2019-04-22 DIAGNOSIS — E1165 Type 2 diabetes mellitus with hyperglycemia: Secondary | ICD-10-CM | POA: Diagnosis not present

## 2019-04-22 LAB — POCT GLYCOSYLATED HEMOGLOBIN (HGB A1C): HbA1c, POC (controlled diabetic range): 9.3 % — AB (ref 0.0–7.0)

## 2019-04-22 MED ORDER — OZEMPIC (1 MG/DOSE) 2 MG/1.5ML ~~LOC~~ SOPN: 1.0000 mg | PEN_INJECTOR | SUBCUTANEOUS | 11 refills | Status: DC

## 2019-04-22 MED ORDER — OZEMPIC (1 MG/DOSE) 2 MG/1.5ML ~~LOC~~ SOPN
1.0000 mg | PEN_INJECTOR | SUBCUTANEOUS | 11 refills | Status: DC
Start: 2019-04-22 — End: 2019-04-22

## 2019-04-22 MED ORDER — OZEMPIC (0.25 OR 0.5 MG/DOSE) 2 MG/1.5ML ~~LOC~~ SOPN: 1.0000 mg | PEN_INJECTOR | SUBCUTANEOUS | 1 refills | Status: DC

## 2019-04-22 NOTE — Progress Notes (Signed)
HPI: Sarah Kane is a 48 y.o. female who  has a past medical history of Arthritis and Diabetes mellitus without complication (HCC).  [facility] today, 04/22/19,  for chief complaint of:  Diabetes follow-up   DIABETES SCREENING/PREVENTIVE CARE: Antihyperglycemic medications as of today, 01/21/19:  Januvia/sitagliptin 100 mg daily  Amaryl/glimepiride 8 mg daily   Started Ozempic last visit 0.5 mg weekly  A1C past 3-6 mos: Yes  controlled? No   07/2018: 7.9  10/2018: 9.2  01/21/19: 10.5  Today 04/22/19: 9.3 BP goal <130/80: close but not quite BP Readings from Last 3 Encounters:  04/22/19 (!) 135/98  01/21/19 (!) 136/92  10/29/18 (!) 165/105   LDL goal <70: no, was 103 in 03/2018 Eye exam annually: none on file, importance discussed with patient Foot exam: No  Microalbuminuria:done 06/2018 and neg Metformin: intolerant ACE/ARB: has declined Antiplatelet if ASCVD Risk >10%: No  Statin: has declined Pneumovax: Yes       Immunization History  Administered Date(s) Administered  . Pneumococcal Polysaccharide-23 10/02/2016       At today's visit 04/22/19 ... PMH, PSH, FH reviewed and updated as needed.  Current medication list and allergy/intolerance hx reviewed and updated as needed. (See remainder of HPI, ROS, Phys Exam below)   No results found.  Results for orders placed or performed in visit on 04/22/19 (from the past 72 hour(s))  POCT HgB A1C     Status: Abnormal   Collection Time: 04/22/19  8:07 AM  Result Value Ref Range   Hemoglobin A1C     HbA1c POC (<> result, manual entry)     HbA1c, POC (prediabetic range)     HbA1c, POC (controlled diabetic range) 9.3 (A) 0.0 - 7.0 %          ASSESSMENT/PLAN: The encounter diagnosis was Type 2 diabetes mellitus with hyperglycemia, without long-term current use of insulin (HCC).  Pt got a home gym system and wants to start exercise regimen   Orders  Placed This Encounter  Procedures  . POCT HgB A1C     Meds ordered this encounter  Medications  . DISCONTD: Semaglutide,0.25 or 0.5MG /DOS, (OZEMPIC, 0.25 OR 0.5 MG/DOSE,) 2 MG/1.5ML SOPN    Sig: Inject 1 mg into the skin once a week.    Dispense:  18 mL    Refill:  1    Please run w/ savings card 30 day supply ok if pt desires  . Semaglutide, 1 MG/DOSE, (OZEMPIC, 1 MG/DOSE,) 2 MG/1.5ML SOPN    Sig: Inject 1 mg into the skin once a week.    Dispense:  4 pen    Refill:  11    Patient Instructions  Things to remember for exercise:   Please note - I am not a certified personal trainer. I can present you with ideas and general workout goals, but an exercise program is largely up to you. Find something you can stick with, and something you enjoy!   As you progress in your exercise regimen think about gradually increasing the following, week by week:   intensity (how strenuous is your workout)  frequency (how often you are exercising)  duration (how many minutes at a time you are exercising)  Walking for 20 minutes a day is certainly better than nothing, but more strenuous exercise will develop better cardiovascular fitness.   interval training (high-intensity alternating with low-intensity, think walk/jog rather than just walk)  muscle strengthening exercises (weight lifting, calisthenics, yoga) -  this also helps prevent osteoporosis!   Things to remember for diet changes:   Please note - I am not a certified dietician. I can present you with ideas and general diet goals, but a meal plan is largely up to you. I am happy to refer you to a dietician who can give you a detailed meal plan.  Apps/logs are crucial to track how you're eating! It's not realistic to be logging everything you eat forever, but when you're starting a healthy eating lifestyle it's very helpful, and checking in with logs now and then helps you stick to your program!   Calorie restriction with the goal weight  loss of no more than one to one and a half pounds per week.   Increase lean protein such as chicken, fish, [place].   Decrease fatty foods such as dairy, butter.   Decrease sugary foods. Avoid sugary drinks such as soda or juice.  Increase fiber found in fruit and vegetables.       Follow-up plan: Return in about 3 months (around 07/23/2019) for A1C recheck, see me sooner if needed! .                                                 ################################################# ################################################# ################################################# #################################################    Current Meds  Medication Sig  . alendronate (FOSAMAX) 70 MG tablet Take 1 tablet (70 mg total) by mouth once a week. Take with a full glass of water on an empty stomach. Please fill generic Rx  . glimepiride (AMARYL) 4 MG tablet TAKE 2 TABLETS BY MOUTH ONCE DAILY WITH BREAKFAST  . Semaglutide,0.25 or 0.5MG /DOS, (OZEMPIC, 0.25 OR 0.5 MG/DOSE,) 2 MG/1.5ML SOPN Inject 0.5 mg into the skin once a week.  . sitaGLIPtin (JANUVIA) 100 MG tablet Take 1 tablet (100 mg total) by mouth daily.  . Vitamin D, Ergocalciferol, (DRISDOL) 50000 units CAPS capsule Take 1 capsule (50,000 Units total) by mouth every 7 (seven) days.    No Known Allergies     Review of Systems:  Constitutional: No recent illness  HEENT: No  headache, no vision change  Cardiac: No  chest pain, No  pressure, No palpitations  Respiratory:  No  shortness of breath. No  Cough  Gastrointestinal: No  abdominal pain, no change on bowel habits  Musculoskeletal: No new myalgia/arthralgia  Skin: No  Rash  Hem/Onc: No  easy bruising/bleeding, No  abnormal lumps/bumps  Neurologic: No  weakness, No  Dizziness  Psychiatric: No  concerns with depression, No  concerns with anxiety  Exam:  BP (!) 135/98   Pulse 82   Wt 181 lb (82.1 kg)    SpO2 100%   BMI 35.35 kg/m   Constitutional: VS see above. General Appearance: alert, well-developed, well-nourished, NAD  Eyes: Normal lids and conjunctive, non-icteric sclera  Neck: No masses, trachea midline.   Respiratory: Normal respiratory effort.   Musculoskeletal: Gait normal. Symmetric and independent movement of all extremities  Neurological: Normal balance/coordination. No tremor.  Skin: warm, dry, intact.   Psychiatric: Normal judgment/insight. Normal mood and affect. Oriented x3.       Visit summary with medication list and pertinent instructions was printed for patient to review, patient was advised to alert [place] if any updates are needed. All questions at time of visit were answered - patient instructed to contact office with any additional  concerns. ER/RTC precautions were reviewed with the patient and understanding verbalized.   Note: Total time spent 25 minutes, greater than 50% of the visit was spent face-to-face counseling and coordinating care for the following: The encounter diagnosis was Type 2 diabetes mellitus with hyperglycemia, without long-term current use of insulin ([place])..  Please note: voice recognition software was used to produce this document, and typos may escape review. Please contact Dr. Sheppard Coil for any needed clarifications.    Follow up plan: Return in about 3 months (around 07/23/2019) for A1C recheck, see me sooner if needed! [name]

## 2019-04-22 NOTE — Patient Instructions (Signed)
Things to remember for exercise:   Please note - I am not a certified personal trainer. I can present you with ideas and general workout goals, but an exercise program is largely up to you. Find something you can stick with, and something you enjoy!   As you progress in your exercise regimen think about gradually increasing the following, week by week:   intensity (how strenuous is your workout)  frequency (how often you are exercising)  duration (how many minutes at a time you are exercising)  Walking for 20 minutes a day is certainly better than nothing, but more strenuous exercise will develop better cardiovascular fitness.   interval training (high-intensity alternating with low-intensity, think walk/jog rather than just walk)  muscle strengthening exercises (weight lifting, calisthenics, yoga) - this also helps prevent osteoporosis!   Things to remember for diet changes:   Please note - I am not a certified dietician. I can present you with ideas and general diet goals, but a meal plan is largely up to you. I am happy to refer you to a dietician who can give you a detailed meal plan.  Apps/logs are crucial to track how you're eating! It's not realistic to be logging everything you eat forever, but when you're starting a healthy eating lifestyle it's very helpful, and checking in with logs now and then helps you stick to your program!   Calorie restriction with the goal weight loss of no more than one to one and a half pounds per week.   Increase lean protein such as chicken, fish, [place].   Decrease fatty foods such as dairy, butter.   Decrease sugary foods. Avoid sugary drinks such as soda or juice.  Increase fiber found in fruit and vegetables.

## 2019-04-22 NOTE — Addendum Note (Signed)
Addended by: Maryla Morrow on: 04/22/2019 12:33 PM   Modules accepted: Orders

## 2019-05-27 ENCOUNTER — Other Ambulatory Visit: Payer: Self-pay | Admitting: Osteopathic Medicine

## 2019-05-27 NOTE — Telephone Encounter (Signed)
Requested medication (s) are due for refill today: yes  Requested medication (s) are on the active medication list:yes  Last refill:  03/25/2019  Future visit scheduled: yes  Notes to clinic:  Review for refill   Requested Prescriptions  Pending Prescriptions Disp Refills   alendronate (FOSAMAX) 70 MG tablet [Pharmacy Med Name: Alendronate Sodium 70 MG Oral Tablet] 48 tablet 0    Sig: TAKE ONE TABLET BY MOUTH ONCE A WEEK WITH A FULL GLASS OF WATER ON AN EMPTY STOMACH     Endocrinology:  Bisphosphonates Failed - 05/27/2019  7:08 AM      Failed - Ca in normal range and within 360 days    No results found for: CALCIUM, CORRECTEDCA, CAWHOLEBLD, POCCA       Failed - Vitamin D in normal range and within 360 days    Vit D, 25-Hydroxy  Date Value Ref Range Status  04/02/2018 72.19  Final         Failed - Valid encounter within last 12 months    Recent Outpatient Visits          1 month ago Type 2 diabetes mellitus with hyperglycemia, without long-term current use of insulin (HCC)   [place] [facility], Dorene Grebe, DO   4 months ago Type 2 diabetes mellitus with hyperglycemia, without long-term current use of insulin [facility])   [place] [facility], Dorene Grebe, DO   7 months ago Type 2 diabetes mellitus with hyperglycemia, without long-term current use of insulin [facility])   [place] [facility], Dorene Grebe, DO   9 months ago Adhesive capsulitis of left shoulder   [place] [facility], Michel Harrow, MD   9 months ago Adhesive capsulitis of left shoulder   [place] [facility], Michel Harrow, MD              glimepiride (AMARYL) 4 MG tablet [Pharmacy Med Name: Glimepiride 4 MG Oral Tablet] 90 tablet 0    Sig: TAKE 2 TABLETS BY MOUTH ONCE DAILY WITH BREAKFAST . APPOINTMENT REQUIRED FOR FUTURE REFILLS     Endocrinology:  Diabetes -  Sulfonylureas Failed - 05/27/2019  7:08 AM      Failed - HBA1C is between 0 and 7.9 and within 180 days    Hemoglobin A1C  Date Value Ref Range Status  04/02/2018 7.5  Final   HbA1c, POC (controlled diabetic range)  Date Value Ref Range Status  04/22/2019 9.3 (A) 0.0 - 7.0 % Final         Failed - Valid encounter within last 6 months    Recent Outpatient Visits          1 month ago Type 2 diabetes mellitus with hyperglycemia, without long-term current use of insulin [facility])   [place] [facility], Dorene Grebe, DO   4 months ago Type 2 diabetes mellitus with hyperglycemia, without long-term current use of insulin [facility])   [place] [facility], Dorene Grebe, DO   7 months ago Type 2 diabetes mellitus with hyperglycemia, without long-term current use of insulin [facility])   [place] [facility], Dorene Grebe, DO   9 months ago Adhesive capsulitis of left shoulder   [place] [facility], Michel Harrow, MD   9 months ago Adhesive capsulitis of left shoulder   [place]  [facility], Rebekah Chesterfield, MD              Vitamin D, Ergocalciferol, (DRISDOL) 1.25 MG (50000 UT) CAPS capsule [Pharmacy Med Name: Vitamin D (Ergocalciferol) 50000 UNIT Oral Capsule] 12 capsule 0    Sig: Take 1 capsule by mouth once a week     Endocrinology:  Vitamins - Vitamin D Supplementation Failed - 05/27/2019  7:08 AM      Failed - 50,000 IU strengths are not delegated      Failed - Ca in normal range and within 360 days    No results found for: CALCIUM, CORRECTEDCA, CAWHOLEBLD, POCCA       Failed - Phosphate in normal range and within 360 days    No results found for: PHOS       Failed - Vitamin D in normal range and within 360 days    Vit D, 25-Hydroxy  Date Value Ref Range Status  04/02/2018 72.19  Final         Failed - Valid encounter within last 12 months     Recent Outpatient Visits          1 month ago Type 2 diabetes mellitus with hyperglycemia, without long-term current use of insulin [facility])   [place] [facility], Lanelle Bal, DO   4 months ago Type 2 diabetes mellitus with hyperglycemia, without long-term current use of insulin [facility])   [place] [facility], Lanelle Bal, DO   7 months ago Type 2 diabetes mellitus with hyperglycemia, without long-term current use of insulin [facility])   [place] [facility], Lanelle Bal, DO   9 months ago Adhesive capsulitis of left shoulder   [place] [facility], Rebekah Chesterfield, MD   9 months ago Adhesive capsulitis of left shoulder   [place] [facility], Rebekah Chesterfield, MD

## 2019-05-27 NOTE — Telephone Encounter (Signed)
Needs appointment

## 2019-05-31 ENCOUNTER — Other Ambulatory Visit: Payer: Self-pay | Admitting: Osteopathic Medicine

## 2019-06-10 ENCOUNTER — Other Ambulatory Visit: Payer: Self-pay | Admitting: Osteopathic Medicine

## 2019-06-22 ENCOUNTER — Other Ambulatory Visit: Payer: Self-pay | Admitting: Osteopathic Medicine

## 2019-06-22 NOTE — Telephone Encounter (Signed)
Forwarding medication refill request to the clinical pool for review. 

## 2019-07-21 ENCOUNTER — Other Ambulatory Visit: Payer: Self-pay | Admitting: Osteopathic Medicine

## 2019-07-23 ENCOUNTER — Ambulatory Visit (INDEPENDENT_AMBULATORY_CARE_PROVIDER_SITE_OTHER): Payer: BC Managed Care – PPO | Admitting: Osteopathic Medicine

## 2019-07-23 ENCOUNTER — Other Ambulatory Visit: Payer: Self-pay

## 2019-07-23 ENCOUNTER — Encounter: Payer: Self-pay | Admitting: Osteopathic Medicine

## 2019-07-23 VITALS — BP 139/98 | HR 101 | Temp 98.4°F | Wt 176.1 lb

## 2019-07-23 DIAGNOSIS — E1165 Type 2 diabetes mellitus with hyperglycemia: Secondary | ICD-10-CM | POA: Diagnosis not present

## 2019-07-23 DIAGNOSIS — I10 Essential (primary) hypertension: Secondary | ICD-10-CM

## 2019-07-23 LAB — POCT GLYCOSYLATED HEMOGLOBIN (HGB A1C): Hemoglobin A1C: 7.8 % — AB (ref 4.0–5.6)

## 2019-07-23 MED ORDER — GABAPENTIN 300 MG PO CAPS: 300.0000 mg | ORAL_CAPSULE | Freq: Three times a day (TID) | ORAL | 1 refills | Status: DC

## 2019-07-23 MED ORDER — OZEMPIC (1 MG/DOSE) 2 MG/1.5ML ~~LOC~~ SOPN: 1.0000 mg | PEN_INJECTOR | SUBCUTANEOUS | 11 refills | Status: DC

## 2019-07-23 MED ORDER — LOSARTAN POTASSIUM 25 MG PO TABS: 25.0000 mg | ORAL_TABLET | Freq: Every day | ORAL | 0 refills | Status: DC

## 2019-07-23 MED ORDER — GLIMEPIRIDE 4 MG PO TABS: 8.0000 mg | ORAL_TABLET | Freq: Every day | ORAL | 1 refills | Status: DC

## 2019-07-23 MED ORDER — ALENDRONATE SODIUM 70 MG PO TABS: 70.0000 mg | ORAL_TABLET | ORAL | 0 refills | Status: DC

## 2019-07-23 MED ORDER — SITAGLIPTIN PHOSPHATE 100 MG PO TABS: 100.0000 mg | ORAL_TABLET | Freq: Every day | ORAL | 1 refills | Status: DC

## 2019-07-23 NOTE — Progress Notes (Signed)
HPI: Sarah Kane is a 48 y.o. female who  has a past medical history of Arthritis and Diabetes mellitus without complication ([place]).  she presents to [facility] today, 07/23/19,  for chief complaint of:  Diabetes follow-up   DIABETES SCREENING/PREVENTIVE CARE: Antihyperglycemic medications as of today, 01/21/19:  Januvia/sitagliptin 100 mg daily  Amaryl/glimepiride 8 mg daily   Ozempic 0.5 mg weekly went up to 1 mg weekly last visit   A1C past 3-6 mos: Yes  controlled? No   07/2018: 7.9  10/2018: 9.2  01/21/19: 10.5  04/22/19: 9.3  07/23/19 today: 7.8 BP goal <130/80: nah - see below, we are starting meds!  BP Readings from Last 3 Encounters:  07/23/19 (!) 139/98  04/22/19 (!) 135/98  01/21/19 (!) 136/92   LDL goal <70: no, was 103 in 03/2018 Eye exam annually: none on file, importance discussed with patient Foot exam: No  Microalbuminuria:done 06/2018 and neg Metformin: intolerant ACE/ARB: has declined Antiplatelet if ASCVD Risk >10%: No  Statin: has declined Pneumovax: Yes       Immunization History  Administered Date(s) Administered  . Pneumococcal Polysaccharide-23 10/02/2016        At today's visit 07/23/19 ... PMH, PSH, FH reviewed and updated as needed.  Current medication list and allergy/intolerance hx reviewed and updated as needed. (See remainder of HPI, ROS, Phys Exam below)   No results found.  Results for orders placed or performed in visit on 07/23/19 (from the past 72 hour(s))  POCT HgB A1C     Status: Abnormal   Collection Time: 07/23/19  8:34 AM  Result Value Ref Range   Hemoglobin A1C 7.8 (A) 4.0 - 5.6 %   HbA1c POC (<> result, manual entry)     HbA1c, POC (prediabetic range)     HbA1c, POC (controlled diabetic range)            ASSESSMENT/PLAN: The primary encounter diagnosis was Type 2 diabetes mellitus with hyperglycemia, without long-term current use of insulin ([place]). A diagnosis of  Essential hypertension was also pertinent to this visit.  A1c a bit better on the Ozempic but still not quite to goal.  Patient would like to try working on diet/exercise a bit more diligently.  I am okay to wait another 3 months but if A1c not to goal would have to adjust medications.   Starting antihypertensives today.   Orders Placed This Encounter  Procedures  . POCT HgB A1C     Meds ordered this encounter  Medications  . losartan (COZAAR) 25 MG tablet    Sig: Take 1 tablet (25 mg total) by mouth daily.    Dispense:  30 tablet    Refill:  0  . alendronate (FOSAMAX) 70 MG tablet    Sig: Take 1 tablet (70 mg total) by mouth once a week.    Dispense:  15 tablet    Refill:  0  . gabapentin (NEURONTIN) 300 MG capsule    Sig: Take 1 capsule (300 mg total) by mouth 3 (three) times daily.    Dispense:  270 capsule    Refill:  1  . glimepiride (AMARYL) 4 MG tablet    Sig: Take 2 tablets (8 mg total) by mouth daily with breakfast.    Dispense:  180 tablet    Refill:  1  . Semaglutide, 1 MG/DOSE, (OZEMPIC, 1 MG/DOSE,) 2 MG/1.5ML SOPN    Sig: Inject 1 mg into the skin once a week.  Dispense:  12 pen    Refill:  11  . sitaGLIPtin (JANUVIA) 100 MG tablet    Sig: Take 1 tablet (100 mg total) by mouth daily.    Dispense:  90 tablet    Refill:  1    There are no Patient Instructions on file for this visit.    Follow-up plan: Return in about 2 weeks (around 08/06/2019) for nurse visit BP check - see [place] sooner if needed! .                                                 ################################################# ################################################# ################################################# #################################################    Current Meds  Medication Sig  . alendronate (FOSAMAX) 70 MG tablet Take 1 tablet (70 mg total) by mouth once a week.  . gabapentin (NEURONTIN) 300 MG capsule  Take 1 capsule (300 mg total) by mouth 3 (three) times daily.  [name] glimepiride (AMARYL) 4 MG tablet Take 2 tablets (8 mg total) by mouth daily with breakfast.  . Semaglutide, 1 MG/DOSE, (OZEMPIC, 1 MG/DOSE,) 2 MG/1.5ML SOPN Inject 1 mg into the skin once a week.  . sitaGLIPtin (JANUVIA) 100 MG tablet Take 1 tablet (100 mg total) by mouth daily.  . Vitamin D, Ergocalciferol, (DRISDOL) 1.25 MG (50000 UT) CAPS capsule Take 1 capsule by mouth once a week  . [DISCONTINUED] alendronate (FOSAMAX) 70 MG tablet Take 1 tablet (70 mg total) by mouth once a week.  . [DISCONTINUED] gabapentin (NEURONTIN) 300 MG capsule TAKE 1 CAPSULE BY MOUTH THREE TIMES DAILY  . [DISCONTINUED] glimepiride (AMARYL) 4 MG tablet TAKE 2 TABLETS BY MOUTH ONCE DAILY WITH BREAKFAST . APPOINTMENT REQUIRED FOR FUTURE REFILLS  . [DISCONTINUED] Semaglutide, 1 MG/DOSE, (OZEMPIC, 1 MG/DOSE,) 2 MG/1.5ML SOPN Inject 1 mg into the skin once a week.  . [DISCONTINUED] sitaGLIPtin (JANUVIA) 100 MG tablet Take 1 tablet (100 mg total) by mouth daily.    No Known Allergies     Review of Systems:  Constitutional: No recent illness  HEENT: No  headache, no vision change  Cardiac: No  chest pain, No  pressure, No palpitations  Respiratory:  No  shortness of breath. No  Cough  Musculoskeletal: No new myalgia/arthralgia  Neurologic: No  weakness, No  Dizziness  Psychiatric: +concerns with depression (this is a hard time of year for her, she is not interested in talking about mood at this point but knows I am available if needed), No  concerns with anxiety  Exam:  BP (!) 139/98 (BP Location: Left Arm, Patient Position: Sitting, Cuff Size: Normal)   Pulse (!) 101   Temp 98.4 F (36.9 C) (Oral)   Wt 176 lb 1.3 oz (79.9 kg)   BMI 34.39 kg/m   Constitutional: VS see above. General Appearance: alert, well-developed, well-nourished, NAD  Eyes: Normal lids and conjunctive, non-icteric sclera  Neck: No masses, trachea midline.    Respiratory: Normal respiratory effort.   Musculoskeletal: Gait normal. Symmetric and independent movement of all extremities  Neurological: Normal balance/coordination. No tremor.  Skin: warm, dry, intact.   Psychiatric: Normal judgment/insight. Normal mood and affect. Oriented x3.       Visit summary with medication list and pertinent instructions was printed for patient to review, patient was advised to alert [place] if any updates are needed. All questions at time of visit were answered - patient instructed to contact  office with any additional concerns. ER/RTC precautions were reviewed with the patient and understanding verbalized.   Note: Total time spent 25 minutes, greater than 50% of the visit was spent face-to-face counseling and coordinating care for the following: The primary encounter diagnosis was Type 2 diabetes mellitus with hyperglycemia, without long-term current use of insulin (HCC). A diagnosis of Essential hypertension was also pertinent to this visit.[name]  Please note: voice recognition software was used to produce this document, and typos may escape review. Please contact Dr. Lyn Hollingshead for any needed clarifications.    Follow up plan: Return in about 2 weeks (around 08/06/2019) for nurse visit BP check - see [place] sooner if needed! [name]

## 2019-08-06 ENCOUNTER — Other Ambulatory Visit: Payer: Self-pay

## 2019-08-06 ENCOUNTER — Ambulatory Visit (INDEPENDENT_AMBULATORY_CARE_PROVIDER_SITE_OTHER): Payer: BC Managed Care – PPO | Admitting: Osteopathic Medicine

## 2019-08-06 VITALS — BP 123/89 | HR 96 | Wt 180.0 lb

## 2019-08-06 DIAGNOSIS — I10 Essential (primary) hypertension: Secondary | ICD-10-CM | POA: Diagnosis not present

## 2019-08-06 MED ORDER — LOSARTAN POTASSIUM 25 MG PO TABS: 25.0000 mg | ORAL_TABLET | Freq: Every day | ORAL | 1 refills | Status: DC

## 2019-08-06 NOTE — Progress Notes (Signed)
Given increased rest, blood pressure looking okay, I am okay to hold off on follow-up for another couple of months but she will need to follow-up on A1c/blood pressure in no more than 4 mos

## 2019-08-06 NOTE — Progress Notes (Signed)
Established Patient Office Visit  Subjective:  Patient ID: Sarah Kane, female    DOB: November 24, 1970  Age: 48 y.o. MRN: [id number]  CC:  Chief Complaint  Patient presents with  . Hypertension    HPI Sarah Kane presents for blood pressure check. She reports no problems with blood pressure medication. Denies chest pain, shortness of breath, headaches or dizziness. She is worried she may lose her job. She is under a lot of stress with work. She does not have a blood pressure monitor.   Past Medical History:  Diagnosis Date  . Arthritis   . Diabetes mellitus without complication [facility])     Past Surgical History:  Procedure Laterality Date  . CESAREAN SECTION  1990  . TRIGGER FINGER RELEASE Right    thumb    Family History  Problem Relation Age of Onset  . High blood pressure Mother   . Diabetes Mother   . Diabetes Maternal Grandmother   . High blood pressure Maternal Grandmother   . Heart attack Maternal Grandfather   . Diabetes Maternal Grandfather   . High blood pressure Maternal Grandfather   . High blood pressure Paternal Grandmother   . High blood pressure Paternal Grandfather   . Diabetes Maternal Aunt   . Diabetes Maternal Uncle   . Skin cancer Maternal Uncle     Social History   Socioeconomic History  . Marital status: Divorced    Spouse name: Not on file  . Number of children: 3  . Years of education: Not on file  . Highest education level: Not on file  Occupational History    Employer: [employer]  Social Needs  . Financial resource strain: Not on file  . Food insecurity    Worry: Not on file    Inability: Not on file  . Transportation needs    Medical: Not on file    Non-medical: Not on file  Tobacco Use  . Smoking status: Never Smoker  . Smokeless tobacco: Never Used  Substance and Sexual Activity  . Alcohol use: No  . Drug use: No  . Sexual activity: Not Currently    Partners: Male  Lifestyle  . Physical activity    Days per week: Not on file    Minutes per session: Not on file  . Stress: Not on file  Relationships  . Social Herbalist on phone: Not on file    Gets together: Not on file    Attends religious service: Not on file    Active member of club or organization: Not on file    Attends meetings of clubs or organizations: Not on file    Relationship status: Not on file  . Intimate partner violence    Fear of current or ex partner: Not on file    Emotionally abused: Not on file    Physically abused: Not on file    Forced sexual activity: Not on file  Other Topics Concern  . Not on file  Social History Narrative  . Not on file    Outpatient Medications Prior to Visit  Medication Sig Dispense Refill  . alendronate (FOSAMAX) 70 MG tablet Take 1 tablet (70 mg total) by mouth once a week. 15 tablet 0  . gabapentin (NEURONTIN) 300 MG capsule Take 1 capsule (300 mg total) by mouth 3 (three) times daily. 270 capsule 1  . glimepiride (AMARYL) 4 MG tablet Take 2 tablets (8 mg total) by mouth daily  with breakfast. 180 tablet 1  . losartan (COZAAR) 25 MG tablet Take 1 tablet (25 mg total) by mouth daily. 30 tablet 0  . Semaglutide, 1 MG/DOSE, (OZEMPIC, 1 MG/DOSE,) 2 MG/1.5ML SOPN Inject 1 mg into the skin once a week. 12 pen 11  . sitaGLIPtin (JANUVIA) 100 MG tablet Take 1 tablet (100 mg total) by mouth daily. 90 tablet 1  . Vitamin D, Ergocalciferol, (DRISDOL) 1.25 MG (50000 UT) CAPS capsule Take 1 capsule by mouth once a week 12 capsule 0   No facility-administered medications prior to visit.     No Known Allergies  ROS Review of Systems    Objective:    Physical Exam  BP 123/89   Pulse 96   Wt 180 lb (81.6 kg)   SpO2 99%   BMI 35.15 kg/m  Wt Readings from Last 3 Encounters:  08/06/19 180 lb (81.6 kg)  07/23/19 176 lb 1.3 oz (79.9 kg)  04/22/19 181 lb (82.1 kg)     Health Maintenance Due  Topic Date Due  . HIV Screening  11/01/1984  . PAP SMEAR-Modifier   11/01/1990    There are no preventive care reminders to display for this patient.  No results found for: TSH Lab Results  Component Value Date   WBC 8.1 04/02/2018   HGB 17.1 (A) 04/02/2018   PLT 243 04/02/2018   No results found for: NA, K, CHLORIDE, CO2, GLUCOSE, BUN, CREATININE, BILITOT, ALKPHOS, AST, ALT, PROT, ALBUMIN, CALCIUM, ANIONGAP, EGFR, GFR Lab Results  Component Value Date   CHOL 180 04/02/2018   Lab Results  Component Value Date   HDL 45 04/02/2018   Lab Results  Component Value Date   LDLCALC 103 04/02/2018   Lab Results  Component Value Date   TRIG 161 (A) 04/02/2018   No results found for: [facility] Lab Results  Component Value Date   HGBA1C 7.8 (A) 07/23/2019      Assessment & Plan:  HTN - Per Dr Sheppard Coil, Patient advised to continue current medications as directed. Refilled Losartan. Return in about 3 months for DM and HTN check.    Problem List Items Addressed This Visit    Essential hypertension - Primary      No orders of the defined types were placed in this encounter.   Follow-up: Return in about 3 months (around 11/06/2019) for DM/HTN with Dr Sheppard Coil. Durene Romans, Monico Blitz, [place]

## 2019-08-14 ENCOUNTER — Other Ambulatory Visit: Payer: Self-pay

## 2019-08-14 ENCOUNTER — Emergency Department (INDEPENDENT_AMBULATORY_CARE_PROVIDER_SITE_OTHER)
Admission: EM | Admit: 2019-08-14 | Discharge: 2019-08-14 | Disposition: A | Discharge status: Home / Self Care | Payer: BC Managed Care – PPO | Source: Home / Self Care

## 2019-08-14 ENCOUNTER — Encounter: Payer: Self-pay | Admitting: Emergency Medicine

## 2019-08-14 DIAGNOSIS — R1012 Left upper quadrant pain: Secondary | ICD-10-CM

## 2019-08-14 DIAGNOSIS — R238 Other skin changes: Secondary | ICD-10-CM

## 2019-08-14 DIAGNOSIS — M25551 Pain in right hip: Secondary | ICD-10-CM

## 2019-08-14 MED ORDER — VALACYCLOVIR HCL 1 G PO TABS: 1000.0000 mg | ORAL_TABLET | Freq: Three times a day (TID) | ORAL | 0 refills | Status: DC

## 2019-08-14 MED ORDER — TRAMADOL HCL 50 MG PO TABS: 50.0000 mg | ORAL_TABLET | Freq: Four times a day (QID) | ORAL | 0 refills | Status: DC | PRN

## 2019-08-14 NOTE — ED Triage Notes (Signed)
Hip pain, trunk pain, x 4 days, sore to the touch. Skin pain

## 2019-08-14 NOTE — Discharge Instructions (Signed)
°  There is a 1-10% chance of paresthesia while taking lisinopril.  This is a nerve type pain described as a prickly, burning sensation.  It is a little abnormal to be localized to those 2 locations on your body rather than all over or just tips of hands and feet. Due to possibility of atypical presentation of shingles, you are being started on Valtrex, an antiviral medication to help lessen the severity and duration of symptoms, especially if you develop a rash.  It is recommended to schedule a follow up appointment early next week with your family doctor if symptoms not improving.  Tramadol is strong pain medication. While taking, do not drink alcohol, drive, or perform any other activities that requires focus while taking these medications.   You may take 500mg  acetaminophen every 4-6 hours or in combination with ibuprofen 400-600mg  every 6-8 hours as needed for pain, inflammation, and fever.  Be sure to well hydrated with clear liquids and get at least 8 hours of sleep at night, preferably more while sick.

## 2019-08-14 NOTE — ED Provider Notes (Signed)
[facility]    CSN: [id number] Arrival date & time: 08/14/19  0807      History   Chief Complaint Chief Complaint  Patient presents with  . Hip Pain    HPI Sarah Kane is a 48 y.o. female.   HPI  Sarah Kane is a 48 y.o. female presenting to UC with c/o Right side hip pain that started about 4 days ago as well as similar pain in Left upper abdomen.  Pain is a burning sore sensation on her skin.  Pain does not feel deeper than her skin.  Pain is worse with light touch.  She did start a new blood pressure medication, Lisinopril about 2 weeks ago and wonders if that could be causing her pain but she also wonders if she could have shingles. She did have chicken pox when she was younger and has felt mild fatigue recently.  She does have a hx of DM and already takes 300mg  gabapentin 3 times daily for years but no relief of current skin pain. Denies fever, chills, n/v/d. No sick contacts or recent travel.    Past Medical History:  Diagnosis Date  . Arthritis   . Diabetes mellitus without complication [facility])     Patient Active Problem List   Diagnosis Date Noted  . Essential hypertension 10/29/2018  . Tear of left supraspinatus tendon 08/14/2018  . 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  . Dupuytren's contracture of both hands 07/26/2018  . Chronic pain 06/28/2018  . ANA positive 06/28/2018  . Arthritis of left acromioclavicular joint 10/03/2017  . Subacromial bursitis 10/03/2017  . Carpal tunnel syndrome 03/06/2017  . Right hand pain 10/02/2016  . Complex regional pain syndrome 06/16/2016  . Trigger finger of right thumb 03/03/2016  . Joint synovitis 02/07/2016    Past Surgical History:  Procedure Laterality Date  . CESAREAN SECTION  1990  . TRIGGER FINGER RELEASE Right    thumb    OB History   No obstetric history on file.      Home Medications    Prior to Admission medications   Medication Sig Start Date End Date Taking? Authorizing  Provider  alendronate (FOSAMAX) 70 MG tablet Take 1 tablet (70 mg total) by mouth once a week. 07/23/19   07/25/19, DO  gabapentin (NEURONTIN) 300 MG capsule Take 1 capsule (300 mg total) by mouth 3 (three) times daily. 07/23/19   07/25/19, DO  glimepiride (AMARYL) 4 MG tablet Take 2 tablets (8 mg total) by mouth daily with breakfast. 07/23/19   07/25/19, DO  losartan (COZAAR) 25 MG tablet Take 1 tablet (25 mg total) by mouth daily. 08/06/19   13/11/20, DO  Semaglutide, 1 MG/DOSE, (OZEMPIC, 1 MG/DOSE,) 2 MG/1.5ML SOPN Inject 1 mg into the skin once a week. 07/23/19   07/25/19, DO  sitaGLIPtin (JANUVIA) 100 MG tablet Take 1 tablet (100 mg total) by mouth daily. 07/23/19   07/25/19, DO  traMADol (ULTRAM) 50 MG tablet Take 1 tablet (50 mg total) by mouth every 6 (six) hours as needed. 08/14/19   08/16/19, PA-C  valACYclovir (VALTREX) 1000 MG tablet Take 1 tablet (1,000 mg total) by mouth 3 (three) times daily. 08/14/19   08/16/19, PA-C  Vitamin D, Ergocalciferol, (DRISDOL) 1.25 MG (50000 UT) CAPS capsule Take 1 capsule by mouth once a week 05/27/19   07/27/19, DO    Family History Family History  Problem Relation Age of Onset  .  High blood pressure Mother   . Diabetes Mother   . Diabetes Maternal Grandmother   . High blood pressure Maternal Grandmother   . Heart attack Maternal Grandfather   . Diabetes Maternal Grandfather   . High blood pressure Maternal Grandfather   . High blood pressure Paternal Grandmother   . High blood pressure Paternal Grandfather   . Diabetes Maternal Aunt   . Diabetes Maternal Uncle   . Skin cancer Maternal Uncle     Social History Social History   Tobacco Use  . Smoking status: Never Smoker  . Smokeless tobacco: Never Used  Substance Use Topics  . Alcohol use: No  . Drug use: No     Allergies   Patient has no known allergies.   Review of Systems Review of Systems   Constitutional: Positive for fatigue. Negative for chills and fever.  Gastrointestinal: Negative for diarrhea, nausea and vomiting.  Musculoskeletal: Positive for myalgias. Negative for arthralgias.  Skin: Negative for color change, rash and wound.  Neurological: Negative for dizziness, light-headedness and headaches.     Physical Exam Triage Vital Signs ED Triage Vitals  Enc Vitals Group     BP 08/14/19 0823 (!) 142/93     Pulse Rate 08/14/19 0823 96     Resp --      Temp 08/14/19 0823 98 F (36.7 C)     Temp Source 08/14/19 0823 Oral     SpO2 08/14/19 0823 99 %     Weight 08/14/19 0825 180 lb (81.6 kg)     Height 08/14/19 0825 5' (1.524 m)     Head Circumference --      Peak Flow --      Pain Score 08/14/19 0824 10     Pain Loc --      Pain Edu? --      Excl. in [place]? --    No data found.  Updated Vital Signs BP (!) 142/93 (BP Location: Right Arm)   Pulse 96   Temp 98 F (36.7 C) (Oral)   Ht 5' (1.524 m)   Wt 180 lb (81.6 kg)   SpO2 99%   BMI 35.15 kg/m     Physical Exam Vitals signs and nursing note reviewed.  Constitutional:      Appearance: Normal appearance. She is well-developed.  HENT:     Head: Normocephalic and atraumatic.  Neck:     Musculoskeletal: Normal range of motion.  Cardiovascular:     Rate and Rhythm: Normal rate and regular rhythm.  Pulmonary:     Effort: Pulmonary effort is normal. No respiratory distress.     Breath sounds: Normal breath sounds.  Musculoskeletal: Normal range of motion.  Skin:    General: Skin is warm and dry.     Findings: No bruising, erythema or rash.          Comments: Right lower abdomen/hip and Left upper abdomen: skin in tact. No ecchymosis or erythema. No lesions. Tenderness to light touch.   Neurological:     Mental Status: She is alert and oriented to person, place, and time.  Psychiatric:        Behavior: Behavior normal.      UC Treatments / Results  Labs (all labs ordered are listed, but only  abnormal results are displayed) Labs Reviewed - No data to display  EKG   Radiology No results found.  Procedures Procedures (including critical care time)  Medications Ordered in UC Medications - No data to display  Initial Impression / Assessment and Plan / UC Course  I have reviewed the triage vital signs and the nursing notes.  Pertinent labs & imaging results that were available during my care of the patient were reviewed by me and considered in my medical decision making (see chart for details).     Hx/symptoms c/w atypical shingles Per UpToDate pt 1-10% of patients experience paresthesia, however, distribution of pain would also be a little atypical for paresthesia. Will start pt on valtrex and tramadol for pain as pt is already on gabapentin 300mg  TID w/o relief. Encouraged f/u with PCP Monitor development of rash. AVS provided.  Final Clinical Impressions(s) / UC Diagnoses   Final diagnoses:  Abdominal pain, left upper quadrant  Right hip pain  Skin irritation     Discharge Instructions      There is a 1-10% chance of paresthesia while taking lisinopril.  This is a nerve type pain described as a prickly, burning sensation.  It is a little abnormal to be localized to those 2 locations on your body rather than all over or just tips of hands and feet. Due to possibility of atypical presentation of shingles, you are being started on Valtrex, an antiviral medication to help lessen the severity and duration of symptoms, especially if you develop a rash.  It is recommended to schedule a follow up appointment early next week with your family doctor if symptoms not improving.  Tramadol is strong pain medication. While taking, do not drink alcohol, drive, or perform any other activities that requires focus while taking these medications.   You may take 500mg  acetaminophen every 4-6 hours or in combination with ibuprofen 400-600mg  every 6-8 hours as needed for pain,  inflammation, and fever.  Be sure to well hydrated with clear liquids and get at least 8 hours of sleep at night, preferably more while sick.        ED Prescriptions    Medication Sig Dispense Auth. Provider   valACYclovir (VALTREX) 1000 MG tablet Take 1 tablet (1,000 mg total) by mouth 3 (three) times daily. 21 tablet Doroteo Glassman, Kyshon Tolliver O, PA-C   traMADol (ULTRAM) 50 MG tablet Take 1 tablet (50 mg total) by mouth every 6 (six) hours as needed. 15 tablet Lurene Shadow, [place]     I have reviewed the PDMP during this encounter.   Lurene Shadow, [place] 08/14/19 1658

## 2019-08-20 ENCOUNTER — Other Ambulatory Visit: Payer: Self-pay

## 2019-08-20 ENCOUNTER — Ambulatory Visit (INDEPENDENT_AMBULATORY_CARE_PROVIDER_SITE_OTHER): Payer: BC Managed Care – PPO | Admitting: Physician Assistant

## 2019-08-20 VITALS — BP 156/93 | HR 113 | Ht 60.0 in | Wt 179.0 lb

## 2019-08-20 DIAGNOSIS — R208 Other disturbances of skin sensation: Secondary | ICD-10-CM | POA: Diagnosis not present

## 2019-08-20 DIAGNOSIS — G894 Chronic pain syndrome: Secondary | ICD-10-CM | POA: Diagnosis not present

## 2019-08-20 DIAGNOSIS — R1012 Left upper quadrant pain: Secondary | ICD-10-CM | POA: Diagnosis not present

## 2019-08-20 DIAGNOSIS — M25551 Pain in right hip: Secondary | ICD-10-CM | POA: Diagnosis not present

## 2019-08-20 MED ORDER — HYDROCODONE-ACETAMINOPHEN 5-325 MG PO TABS: 1.0000 | ORAL_TABLET | ORAL | 0 refills | Status: AC | PRN

## 2019-08-20 MED ORDER — PREDNISONE 20 MG PO TABS: ORAL_TABLET | ORAL | 0 refills | Status: DC

## 2019-08-20 NOTE — Patient Instructions (Signed)
Postherpetic Neuralgia Postherpetic neuralgia (PHN) is nerve pain that occurs after a shingles infection. Shingles is a painful rash that appears on one area of the body, usually on the trunk or face. Shingles is caused by the varicella-zoster virus. This is the same virus that causes chickenpox. In people who have had chickenpox, the virus can resurface years later and cause shingles. You may have PHN if you continue to have pain for 4 months after your shingles rash has gone away. PHN appears in the same area where you had the shingles rash. The pain usually goes away after the rash disappears. Getting a vaccination for shingles can prevent PHN. This vaccine is recommended for people older than 60. It may prevent shingles, and may also lower your risk of PHN if you do get shingles. What are the causes? This condition is caused by damage to your nerves from the varicella-zoster virus. The damage makes your nerves overly sensitive. What increases the risk? The following factors may make you more likely to develop this condition:  Being older than 49 years of age.  Having severe pain before your shingles rash starts.  Having a severe rash.  Having shingles in and around the eye area.  Having a disease that makes your body unable to fight infections (weak immune system). What are the signs or symptoms? The main symptom of this condition is pain. The pain may:  Often be very bad and may be described as stabbing, burning, or feeling like an electric shock.  Come and go or may be there all the time.  Be triggered by light touches on the skin or changes in temperature. You may have itching along with the pain. How is this diagnosed? This condition may be diagnosed based on your symptoms and your history of shingles. Lab studies and other diagnostic tests are usually not needed. How is this treated? There is no cure for this condition. Treatment for PHN will focus on pain relief.  Over-the-counter pain relievers do not usually relieve PHN pain. You may need to work with a pain specialist. Treatment may include:  Antidepressant medicines to help with pain and improve sleep.  Anti-seizure medicines to relieve nerve pain.  Strong pain relievers (opioids).  A numbing patch worn on the skin (lidocaine patch).  Botox (botulinum toxin) injections to block pain signals between nerves and muscles.  Injections of numbing medicine or anti-inflammatory medicines around irritated nerves. Follow these instructions at home:   It may take a long time to recover from PHN. Work closely with your health care provider and develop a good support system at home.  Take over-the-counter and prescription medicines only as told by your health care provider.  Do not drive or use heavy machinery while taking prescription pain medicine.  Wear loose, comfortable clothing.  Cover sensitive areas with a dressing to reduce friction from clothing rubbing on the area.  If directed, put ice on the painful area: ? Put ice in a plastic bag. ? Place a towel between your skin and the bag. ? Leave the ice on for 20 minutes, 2-3 times a day.  Talk to your health care provider if you feel depressed or desperate. Living with long-term pain can be depressing.  Keep all follow-up visits as told by your health care provider. This is important. Contact a health care provider if:  Your medicine is not helping.  You are struggling to manage your pain at home. Summary  Postherpetic neuralgia is a very painful disorder   that can occur after an episode of shingles.  The pain is often severe, burning, electric, or stabbing.  Prescription medicines can be helpful in managing persistent pain.  Getting a vaccination for shingles can prevent PHN. This vaccine is recommended for people older than 60. This information is not intended to replace advice given to you by your health care provider. Make sure  you discuss any questions you have with your health care provider. Document Released: 12/02/2002 Document Revised: 08/24/2017 Document Reviewed: 11/28/2016 Elsevier Patient Education  2020 [employer] Shingles  Shingles is an infection. It gives you a painful skin rash and blisters that have fluid in them. Shingles is caused by the same germ (virus) that causes chickenpox. Shingles only happens in people who:  Have had chickenpox.  Have been given a shot of medicine (vaccine) to protect against chickenpox. Shingles is rare in this group. The first symptoms of shingles may be itching, tingling, or pain in an area on your skin. A rash will show on your skin a few days or weeks later. The rash is likely to be on one side of your body. The rash usually has a shape like a belt or a band. Over time, the rash turns into fluid-filled blisters. The blisters will break open, change into scabs, and dry up. Medicines may:  Help with pain and itching.  Help you get better sooner.  Help to prevent long-term problems. Follow these instructions at home: Medicines  Take over-the-counter and prescription medicines only as told by your doctor.  Put on an anti-itch cream or numbing cream where you have a rash, blisters, or scabs. Do this as told by your doctor. Helping with itching and discomfort   Put cold, wet cloths (cold compresses) on the area of the rash or blisters as told by your doctor.  Cool baths can help you feel better. Try adding baking soda or dry oatmeal to the water to lessen itching. Do not bathe in hot water. Blister and rash care  Keep your rash covered with a loose bandage (dressing).  Wear loose clothing that does not rub on your rash.  Keep your rash and blisters clean. To do this, wash the area with mild soap and cool water as told by your doctor.  Check your rash every day for signs of infection. Check for: ? More redness, swelling, or pain. ? Fluid or blood. ?  Warmth. ? Pus or a bad smell.  Do not scratch your rash. Do not pick at your blisters. To help you to not scratch: ? Keep your fingernails clean and cut short. ? Wear gloves or mittens when you sleep, if scratching is a problem. General instructions  Rest as told by your doctor.  Keep all follow-up visits as told by your doctor. This is important.  Wash your hands often with soap and water. If soap and water are not available, use hand sanitizer. Doing this lowers your chance of getting a skin infection caused by germs (bacteria).  Your infection can cause chickenpox in people who have never had chickenpox or never got a shot of chickenpox vaccine. If you have blisters that did not change into scabs yet, try not to touch other people or be around other people, especially: ? Babies. ? Pregnant women. ? Children who have areas of red, itchy, or rough skin (eczema). ? Very old people who have transplants. ? People who have a long-term (chronic) sickness, like cancer or AIDS. Contact a doctor if:  Your  pain does not get better with medicine.  Your pain does not get better after the rash heals.  You have any signs of infection in the rash area. These signs include: ? More redness, swelling, or pain around the rash. ? Fluid or blood coming from the rash. ? The rash area feeling warm to the touch. ? Pus or a bad smell coming from the rash. Get help right away if:  The rash is on your face or nose.  You have pain in your face or pain by your eye.  You lose feeling on one side of your face.  You have trouble seeing.  You have ear pain, or you have ringing in your ear.  You have a loss of taste.  Your condition gets worse. Summary  Shingles gives you a painful skin rash and blisters that have fluid in them.  Shingles is an infection. It is caused by the same germ (virus) that causes chickenpox.  Keep your rash covered with a loose bandage (dressing). Wear loose clothing that  does not rub on your rash.  If you have blisters that did not change into scabs yet, try not to touch other people or be around people. This information is not intended to replace advice given to you by your health care provider. Make sure you discuss any questions you have with your health care provider. Document Released: 02/28/2008 Document Revised: 01/03/2019 Document Reviewed: 05/16/2017 Elsevier Patient Education  2020 [employer].

## 2019-08-20 NOTE — Progress Notes (Signed)
Subjective:    Patient ID: Sarah Kane, female    DOB: [birth year], 48 y.o.   MRN: [id number]  HPI  Pt is a 48 yo female with chronic pain who presents to the clinic to follow up after UC visit on 11/19 for suspected shingles. She was given valtrex. Lisinopril was stopped just in case lisinopril reaction. losartan started. Pt's skin is tender to touch. She is in 10/10 pain. No rash. No bowel changes. No stomach pain. Food does not make better or worse. Mother had shingles about 1 month ago. No nausea. No radiation of pain to the back.    .. Active Ambulatory Problems    Diagnosis Date Noted  . Arthritis of left acromioclavicular joint 10/03/2017  . Carpal tunnel syndrome 03/06/2017  . Complex regional pain syndrome 06/16/2016  . Joint synovitis 02/07/2016  . Right hand pain 10/02/2016  . Subacromial bursitis 10/03/2017  . Trigger finger of right thumb 03/03/2016  . Chronic pain 06/28/2018  . ANA positive 06/28/2018  . 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  . Dupuytren's contracture of both hands 07/26/2018  . Tear of left supraspinatus tendon 08/14/2018  . Essential hypertension 10/29/2018  . Pain of skin 08/23/2019   Resolved Ambulatory Problems    Diagnosis Date Noted  . No Resolved Ambulatory Problems   Past Medical History:  Diagnosis Date  . Arthritis   . Diabetes mellitus without complication ([place])       Review of Systems See HPI.     Objective:   Physical Exam Vitals signs reviewed.  Constitutional:      Appearance: Normal appearance. She is obese.  Cardiovascular:     Rate and Rhythm: Regular rhythm. Tachycardia present.  Pulmonary:     Effort: Pulmonary effort is normal.     Breath sounds: Normal breath sounds.  Abdominal:     Palpations: Abdomen is soft.     Comments: Pain with faint touch of finger over left upper quadrant to skin and right hip and lower quadrant. No rash.   Skin:    Comments: No rash.   Neurological:     Mental Status:  She is alert.  Psychiatric:        Mood and Affect: Mood normal.        Behavior: Behavior normal.           Assessment & Plan:  Marland KitchenMarland KitchenKaelyn was seen today for pain.  Diagnoses and all orders for this visit:  Pain of skin -     Varicella-zoster by PCR -     HYDROcodone-acetaminophen (NORCO/VICODIN) 5-325 MG tablet; Take 1-2 tablets by mouth every 4 (four) hours as needed for up to 5 days for moderate pain. For acute pain. -     predniSONE (DELTASONE) 20 MG tablet; Take 3 tablets for 3 days, take 2 tablets for 3 days, take 1 tablet for 3 days, take 1/2 tablet for 4 days. -     COMPLETE METABOLIC PANEL WITH GFR -     CBC w/Diff  Left upper quadrant abdominal pain -     Varicella-zoster by PCR -     HYDROcodone-acetaminophen (NORCO/VICODIN) 5-325 MG tablet; Take 1-2 tablets by mouth every 4 (four) hours as needed for up to 5 days for moderate pain. For acute pain. -     predniSONE (DELTASONE) 20 MG tablet; Take 3 tablets for 3 days, take 2 tablets for 3 days, take 1 tablet for 3 days, take 1/2 tablet for 4 days. -  COMPLETE METABOLIC PANEL WITH GFR -     CBC w/Diff  Right hip pain -     Varicella-zoster by PCR -     HYDROcodone-acetaminophen (NORCO/VICODIN) 5-325 MG tablet; Take 1-2 tablets by mouth every 4 (four) hours as needed for up to 5 days for moderate pain. For acute pain. -     predniSONE (DELTASONE) 20 MG tablet; Take 3 tablets for 3 days, take 2 tablets for 3 days, take 1 tablet for 3 days, take 1/2 tablet for 4 days. -     COMPLETE METABOLIC PANEL WITH GFR -     CBC w/Diff  Chronic pain syndrome   Certainly not typical distribution but clinical symptoms are consistent with shingles.   Pt has only been taking gabapentin twice a day. Encouraged patient to increase to three times a day. norco for acute pain given. Discussed small quanity and to use sparingly. [name].PDMP reviewed during this encounter. Prednisone taper to start.   PCR ordered to look for shingles. CBC and  CMP ordered.

## 2019-08-23 ENCOUNTER — Encounter: Payer: Self-pay | Admitting: Physician Assistant

## 2019-08-23 DIAGNOSIS — R208 Other disturbances of skin sensation: Secondary | ICD-10-CM | POA: Insufficient documentation

## 2019-08-24 LAB — VARICELLA-ZOSTER BY PCR: VARICELLA ZOSTER VIRUS (VZV) DNA, QL RT PCR: NOT DETECTED

## 2019-08-24 LAB — COMPLETE METABOLIC PANEL WITH GFR
AG Ratio: 1.7 (calc) (ref 1.0–2.5)
ALT: 44 U/L — ABNORMAL HIGH (ref 6–29)
AST: 27 U/L (ref 10–35)
Albumin: 4.3 g/dL (ref 3.6–5.1)
Alkaline phosphatase (APISO): 56 U/L (ref 31–125)
BUN: 12 mg/dL (ref 7–25)
CO2: 27 mmol/L (ref 20–32)
Calcium: 9.8 mg/dL (ref 8.6–10.2)
Chloride: 103 mmol/L (ref 98–110)
Creat: 0.84 mg/dL (ref 0.50–1.10)
GFR, Est African American: 95 mL/min/{1.73_m2} (ref >=60)
GFR, Est Non African American: 82 mL/min/{1.73_m2} (ref >=60)
Globulin: 2.5 g/dL (calc) (ref 1.9–3.7)
Glucose, Bld: 169 mg/dL — ABNORMAL HIGH (ref 65–99)
Potassium: 4.6 mmol/L (ref 3.5–5.3)
Sodium: 142 mmol/L (ref 135–146)
Total Bilirubin: 0.4 mg/dL (ref 0.2–1.2)
Total Protein: 6.8 g/dL (ref 6.1–8.1)

## 2019-08-24 LAB — CBC WITH DIFFERENTIAL/PLATELET
Absolute Monocytes: 502 cells/uL (ref 200–950)
Basophils Absolute: 23 cells/uL (ref 0–200)
Basophils Relative: 0.3 %
Eosinophils Absolute: 91 cells/uL (ref 15–500)
Eosinophils Relative: 1.2 %
HCT: 45.4 % — ABNORMAL HIGH (ref 35.0–45.0)
Hemoglobin: 15.3 g/dL (ref 11.7–15.5)
Lymphs Abs: 2462 cells/uL (ref 850–3900)
MCH: 28.8 pg (ref 27.0–33.0)
MCHC: 33.7 g/dL (ref 32.0–36.0)
MCV: 85.3 fL (ref 80.0–100.0)
MPV: 12.2 fL (ref 7.5–12.5)
Monocytes Relative: 6.6 %
Neutro Abs: 4522 cells/uL (ref 1500–7800)
Neutrophils Relative %: 59.5 %
Platelets: 275 10*3/uL (ref 140–400)
RBC: 5.32 10*6/uL — ABNORMAL HIGH (ref 3.80–5.10)
RDW: 12.9 % (ref 11.0–15.0)
Total Lymphocyte: 32.4 %
WBC: 7.6 10*3/uL (ref 3.8–10.8)

## 2019-08-24 NOTE — Progress Notes (Signed)
Shingles was not detected. How are symptoms?

## 2019-09-09 ENCOUNTER — Other Ambulatory Visit: Payer: Self-pay | Admitting: Osteopathic Medicine

## 2019-09-09 NOTE — Telephone Encounter (Signed)
Requested medication (s) are due for refill today: yes  Requested medication (s) are on the active medication list: yes  Last refill: 05/27/2019  Future visit scheduled:no  Notes to clinic: refill cannot be delegated   Requested Prescriptions  Pending Prescriptions Disp Refills   Vitamin D, Ergocalciferol, (DRISDOL) 1.25 MG (50000 UT) CAPS capsule [Pharmacy Med Name: Vitamin D (Ergocalciferol) 50000 UNIT Oral Capsule] 12 capsule 0    Sig: Take 1 capsule by mouth once a week      Endocrinology:  Vitamins - Vitamin D Supplementation Failed - 09/09/2019  7:25 AM      Failed - 50,000 IU strengths are not delegated      Failed - Phosphate in normal range and within 360 days    No results found for: PHOS        Failed - Vitamin D in normal range and within 360 days    Vit D, 25-Hydroxy  Date Value Ref Range Status  04/02/2018 72.19  Final          Passed - Ca in normal range and within 360 days    Calcium  Date Value Ref Range Status  08/20/2019 9.8 8.6 - 10.2 mg/dL Final          Passed - Valid encounter within last 12 months    Recent Outpatient Visits           2 weeks ago Pain of skin   [facility], Royetta Car, PA-C   1 month ago Essential hypertension   [facility], [place], DO   1 month ago Type 2 diabetes mellitus with hyperglycemia, without long-term current use of insulin [facility])   [facility], Lanelle Bal, DO   4 months ago Type 2 diabetes mellitus with hyperglycemia, without long-term current use of insulin [facility])   [facility], Lanelle Bal, DO   7 months ago Type 2 diabetes mellitus with hyperglycemia, without long-term current use of insulin [facility])   [facility], [place], DO

## 2019-10-14 ENCOUNTER — Other Ambulatory Visit: Payer: Self-pay | Admitting: Osteopathic Medicine

## 2019-10-16 ENCOUNTER — Other Ambulatory Visit: Payer: Self-pay | Admitting: Osteopathic Medicine

## 2019-10-16 NOTE — Telephone Encounter (Signed)
Please review for refill- patient at [facility] 

## 2019-10-23 ENCOUNTER — Encounter: Payer: Self-pay | Admitting: Osteopathic Medicine

## 2019-10-23 ENCOUNTER — Ambulatory Visit (INDEPENDENT_AMBULATORY_CARE_PROVIDER_SITE_OTHER): Payer: BC Managed Care – PPO | Admitting: Osteopathic Medicine

## 2019-10-23 VITALS — BP 148/94 | HR 92 | Wt 178.0 lb

## 2019-10-23 DIAGNOSIS — I10 Essential (primary) hypertension: Secondary | ICD-10-CM | POA: Diagnosis not present

## 2019-10-23 DIAGNOSIS — E1165 Type 2 diabetes mellitus with hyperglycemia: Secondary | ICD-10-CM | POA: Diagnosis not present

## 2019-10-23 DIAGNOSIS — B029 Zoster without complications: Secondary | ICD-10-CM

## 2019-10-23 DIAGNOSIS — F332 Major depressive disorder, recurrent severe without psychotic features: Secondary | ICD-10-CM | POA: Diagnosis not present

## 2019-10-23 LAB — POCT GLYCOSYLATED HEMOGLOBIN (HGB A1C): Hemoglobin A1C: 7.8 % — AB (ref 4.0–5.6)

## 2019-10-23 MED ORDER — BUPROPION HCL ER (XL) 150 MG PO TB24: 150.0000 mg | ORAL_TABLET | ORAL | 0 refills | Status: DC

## 2019-10-23 MED ORDER — VALACYCLOVIR HCL 1 G PO TABS: 1000.0000 mg | ORAL_TABLET | Freq: Three times a day (TID) | ORAL | 0 refills | Status: DC

## 2019-10-23 NOTE — Patient Instructions (Signed)
Plan:  Moods: will start Wellbutrin, take when you wake up. Will touch base in 2 weeks to see how this is working. See below for crisis mental health care if needed!   Shingles: refilled Valtrex. OK to take Gabapentin 300-600 mg three times per day as needed.   Diabetes: will renew the Januvia. Work on avoiding arbs, but understandable if your stress levels make sugars more difficult in the next few months. We can regroup later.    For immediate mental health services if needed: [facility], [address] [place], [place] [address], [phone] [facility], [address], [place], [place] [address], [phone] Any emergency room National Suicide Prevention Lifeline, [phone]
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
## 2019-11-06 ENCOUNTER — Encounter: Payer: Self-pay | Admitting: Osteopathic Medicine

## 2019-11-06 ENCOUNTER — Other Ambulatory Visit: Payer: Self-pay | Admitting: Osteopathic Medicine

## 2019-11-06 ENCOUNTER — Telehealth (INDEPENDENT_AMBULATORY_CARE_PROVIDER_SITE_OTHER): Payer: BC Managed Care – PPO | Admitting: Osteopathic Medicine

## 2019-11-06 VITALS — Wt 178.0 lb

## 2019-11-06 DIAGNOSIS — F339 Major depressive disorder, recurrent, unspecified: Secondary | ICD-10-CM

## 2019-11-06 NOTE — Progress Notes (Signed)
Virtual Visit via Phone  I connected with      Sarah Kane on 11/06/19 at 8:24 AM  by a telemedicine application and verified that I am speaking with the correct person using two identifiers.  Patient is at home I am in office   I discussed the limitations of evaluation and management by telemedicine and the availability of in person appointments. The patient expressed understanding and agreed to proceed.  History of Present Illness: Sarah Kane is a 49 y.o. female who would like to discuss mental health  About the same Hasn't started the Wellbutrin yet d/t finances Stress w/ dad's recent illness (he lives out of state) Mom's recent death is still weighing on her and causing financial issues     Observations/Objective: Wt 178 lb (80.7 kg)   BMI 34.76 kg/m  BP Readings from Last 3 Encounters:  10/23/19 (!) 148/94  08/20/19 (!) 156/93  08/14/19 (!) 142/93   Exam: Normal Speech.  NAD  Lab and Radiology Results No results found for this or any previous visit (from the past 72 hour(s)). No results found.     Assessment and Plan: 49 y.o. female with The encounter diagnosis was Depression, recurrent (HCC).  Will not charge for today Not really doing or changing anything, pt hsan't started meds She will call us when she's able to start Rx Advised 911/ER if crisis       Follow Up Instructions: Return for VIRTUAL VISIT FEW WEEKS AFTER FILLING/STARTING WELLBUTRIN .    I discussed the assessment and treatment plan with the patient. The patient was provided an opportunity to ask questions and all were answered. The patient agreed with the plan and demonstrated an understanding of the instructions.   The patient was advised to call back or seek an in-person evaluation if any new concerns, if symptoms worsen or if the condition fails to improve as anticipated.  15 minutes of non-face-to-face time was provided during this  encounter.      . . . . . . . . . . . . . [name]                   Historical information moved to improve visibility of documentation.  Past Medical History:  Diagnosis Date  . Arthritis   . Diabetes mellitus without complication [facility])    Past Surgical History:  Procedure Laterality Date  . CESAREAN SECTION  1990  . TRIGGER FINGER RELEASE Right    thumb   Social History   Tobacco Use  . Smoking status: Never Smoker  . Smokeless tobacco: Never Used  Substance Use Topics  . Alcohol use: No   family history includes Diabetes in her maternal aunt, maternal grandfather, maternal grandmother, maternal uncle, and mother; Heart attack in her maternal grandfather; High blood pressure in her maternal grandfather, maternal grandmother, mother, paternal grandfather, and paternal grandmother; Skin cancer in her maternal uncle.  Medications: Current Outpatient Medications  Medication Sig Dispense Refill  . alendronate (FOSAMAX) 70 MG tablet Take 1 tablet (70 mg total) by mouth once a week. 15 tablet 0  . buPROPion (WELLBUTRIN XL) 150 MG 24 hr tablet Take 1 tablet (150 mg total) by mouth every morning. 90 tablet 0  . gabapentin (NEURONTIN) 300 MG capsule Take 1 capsule (300 mg total) by mouth 3 (three) times daily. 270 capsule 1  . glimepiride (AMARYL) 4 MG tablet Take 2 tablets (8 mg total) by mouth daily with breakfast. 180 tablet  1  . losartan (COZAAR) 25 MG tablet Take 1 tablet (25 mg total) by mouth daily. 90 tablet 1  . Semaglutide, 1 MG/DOSE, (OZEMPIC, 1 MG/DOSE,) 2 MG/1.5ML SOPN Inject 1 mg into the skin once a week. 12 pen 11  . sitaGLIPtin (JANUVIA) 100 MG tablet Take 1 tablet (100 mg total) by mouth daily. 90 tablet 1  . valACYclovir (VALTREX) 1000 MG tablet Take 1 tablet (1,000 mg total) by mouth 3 (three) times daily. 21 tablet 0  . Vitamin D, Ergocalciferol, (DRISDOL) 1.25 MG (50000 UT) CAPS capsule Take 1 capsule by mouth once a week 12 capsule 0    No current facility-administered medications for this visit.   No Known Allergies

## 2019-11-06 NOTE — Telephone Encounter (Signed)
Please review for refill- patient at [facility] 

## 2019-12-07 ENCOUNTER — Other Ambulatory Visit: Payer: Self-pay | Admitting: Osteopathic Medicine

## 2019-12-08 MED ORDER — ALENDRONATE SODIUM 70 MG PO TABS: 70.0000 mg | ORAL_TABLET | ORAL | 0 refills | Status: DC

## 2019-12-08 NOTE — Telephone Encounter (Signed)
Resent

## 2019-12-08 NOTE — Telephone Encounter (Signed)
Transmission failed. Please resend to pharmacy

## 2019-12-08 NOTE — Addendum Note (Signed)
Addended by: Jed Limerick on: 12/08/2019 11:02 AM   Modules accepted: Orders

## 2019-12-25 ENCOUNTER — Telehealth: Payer: Self-pay | Admitting: Medical-Surgical

## 2019-12-25 NOTE — Telephone Encounter (Signed)
Patient overdue for pap according to our records. Please contact to see if she has had this done in the last 5 years. If so, please respond with date and provider that completed it. If not, please offer to schedule appointment for pap smear to be done.  

## 2019-12-31 NOTE — Telephone Encounter (Signed)
12/31/2019  LVM for pt to call to discuss.  T. Orville Mena, CMA  

## 2020-01-06 ENCOUNTER — Other Ambulatory Visit: Payer: Self-pay

## 2020-01-06 ENCOUNTER — Other Ambulatory Visit: Payer: Self-pay | Admitting: Osteopathic Medicine

## 2020-01-08 MED ORDER — GABAPENTIN 300 MG PO CAPS: 300.0000 mg | ORAL_CAPSULE | Freq: Three times a day (TID) | ORAL | 0 refills | Status: DC

## 2020-01-08 MED ORDER — GLIMEPIRIDE 4 MG PO TABS: ORAL_TABLET | ORAL | 0 refills | Status: DC

## 2020-01-08 NOTE — Addendum Note (Signed)
Addended by: Delfino Lovett on: 01/08/2020 11:56 AM   Modules accepted: Orders

## 2020-01-13 ENCOUNTER — Ambulatory Visit (INDEPENDENT_AMBULATORY_CARE_PROVIDER_SITE_OTHER): Payer: BC Managed Care – PPO | Admitting: Osteopathic Medicine

## 2020-01-13 ENCOUNTER — Encounter: Payer: Self-pay | Admitting: Osteopathic Medicine

## 2020-01-13 VITALS — BP 135/92 | HR 92 | Temp 98.0°F | Wt 178.0 lb

## 2020-01-13 DIAGNOSIS — E1165 Type 2 diabetes mellitus with hyperglycemia: Secondary | ICD-10-CM

## 2020-01-13 DIAGNOSIS — F339 Major depressive disorder, recurrent, unspecified: Secondary | ICD-10-CM

## 2020-01-13 DIAGNOSIS — I10 Essential (primary) hypertension: Secondary | ICD-10-CM | POA: Diagnosis not present

## 2020-01-13 DIAGNOSIS — Z0000 Encounter for general adult medical examination without abnormal findings: Secondary | ICD-10-CM

## 2020-01-13 MED ORDER — LOSARTAN POTASSIUM 25 MG PO TABS: 25.0000 mg | ORAL_TABLET | Freq: Every day | ORAL | 1 refills | Status: DC

## 2020-01-13 MED ORDER — SITAGLIPTIN PHOSPHATE 100 MG PO TABS: 100.0000 mg | ORAL_TABLET | Freq: Every day | ORAL | 1 refills | Status: DC

## 2020-01-13 MED ORDER — BUPROPION HCL ER (XL) 150 MG PO TB24: 150.0000 mg | ORAL_TABLET | ORAL | 1 refills | Status: DC
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
## 2020-01-13 NOTE — Telephone Encounter (Signed)
Patient advised in office visit today.

## 2020-02-05 LAB — HM COLONOSCOPY

## 2020-04-08 ENCOUNTER — Other Ambulatory Visit: Payer: Self-pay | Admitting: Osteopathic Medicine

## 2020-04-14 ENCOUNTER — Encounter: Payer: BC Managed Care – PPO | Admitting: Osteopathic Medicine

## 2020-05-03 ENCOUNTER — Other Ambulatory Visit: Payer: Self-pay | Admitting: Osteopathic Medicine

## 2020-05-20 ENCOUNTER — Other Ambulatory Visit: Payer: Self-pay

## 2020-05-20 ENCOUNTER — Inpatient Hospital Stay (HOSPITAL_BASED_OUTPATIENT_CLINIC_OR_DEPARTMENT_OTHER)
Admission: EM | Admit: 2020-05-20 | Discharge: 2020-05-22 | DRG: 247 | Disposition: A | Discharge status: Home / Self Care | Payer: BC Managed Care – PPO | Attending: Cardiovascular Disease | Admitting: Cardiovascular Disease

## 2020-05-20 ENCOUNTER — Encounter (HOSPITAL_BASED_OUTPATIENT_CLINIC_OR_DEPARTMENT_OTHER): Payer: Self-pay | Admitting: Emergency Medicine

## 2020-05-20 ENCOUNTER — Emergency Department (HOSPITAL_BASED_OUTPATIENT_CLINIC_OR_DEPARTMENT_OTHER): Payer: BC Managed Care – PPO

## 2020-05-20 ENCOUNTER — Encounter (HOSPITAL_COMMUNITY)
Admission: EM | Disposition: A | Discharge status: Home / Self Care | Payer: Self-pay | Source: Home / Self Care | Attending: Cardiology

## 2020-05-20 DIAGNOSIS — M1990 Unspecified osteoarthritis, unspecified site: Secondary | ICD-10-CM | POA: Diagnosis present

## 2020-05-20 DIAGNOSIS — Z955 Presence of coronary angioplasty implant and graft: Secondary | ICD-10-CM

## 2020-05-20 DIAGNOSIS — E1140 Type 2 diabetes mellitus with diabetic neuropathy, unspecified: Secondary | ICD-10-CM | POA: Diagnosis present

## 2020-05-20 DIAGNOSIS — I2510 Atherosclerotic heart disease of native coronary artery without angina pectoris: Secondary | ICD-10-CM

## 2020-05-20 DIAGNOSIS — E785 Hyperlipidemia, unspecified: Secondary | ICD-10-CM

## 2020-05-20 DIAGNOSIS — I214 Non-ST elevation (NSTEMI) myocardial infarction: Secondary | ICD-10-CM | POA: Diagnosis present

## 2020-05-20 DIAGNOSIS — Z808 Family history of malignant neoplasm of other organs or systems: Secondary | ICD-10-CM

## 2020-05-20 DIAGNOSIS — Z7983 Long term (current) use of bisphosphonates: Secondary | ICD-10-CM

## 2020-05-20 DIAGNOSIS — Z79899 Other long term (current) drug therapy: Secondary | ICD-10-CM

## 2020-05-20 DIAGNOSIS — I10 Essential (primary) hypertension: Secondary | ICD-10-CM | POA: Diagnosis present

## 2020-05-20 DIAGNOSIS — Z8249 Family history of ischemic heart disease and other diseases of the circulatory system: Secondary | ICD-10-CM | POA: Diagnosis not present

## 2020-05-20 DIAGNOSIS — Z833 Family history of diabetes mellitus: Secondary | ICD-10-CM | POA: Diagnosis not present

## 2020-05-20 DIAGNOSIS — Z20822 Contact with and (suspected) exposure to covid-19: Secondary | ICD-10-CM | POA: Diagnosis present

## 2020-05-20 DIAGNOSIS — Z7984 Long term (current) use of oral hypoglycemic drugs: Secondary | ICD-10-CM | POA: Diagnosis not present

## 2020-05-20 DIAGNOSIS — I255 Ischemic cardiomyopathy: Secondary | ICD-10-CM

## 2020-05-20 DIAGNOSIS — E1165 Type 2 diabetes mellitus with hyperglycemia: Secondary | ICD-10-CM | POA: Diagnosis present

## 2020-05-20 DIAGNOSIS — I25118 Atherosclerotic heart disease of native coronary artery with other forms of angina pectoris: Secondary | ICD-10-CM | POA: Diagnosis present

## 2020-05-20 DIAGNOSIS — R079 Chest pain, unspecified: Secondary | ICD-10-CM | POA: Diagnosis not present

## 2020-05-20 HISTORY — DX: Essential (primary) hypertension: I10

## 2020-05-20 HISTORY — PX: CORONARY STENT INTERVENTION: CATH118234

## 2020-05-20 HISTORY — PX: LEFT HEART CATH AND CORONARY ANGIOGRAPHY: CATH118249

## 2020-05-20 LAB — CBC
HCT: 43.9 % (ref 36.0–46.0)
Hemoglobin: 14.8 g/dL (ref 12.0–15.0)
MCH: 28.8 pg (ref 26.0–34.0)
MCHC: 33.7 g/dL (ref 30.0–36.0)
MCV: 85.4 fL (ref 80.0–100.0)
Platelets: 246 10*3/uL (ref 150–400)
RBC: 5.14 MIL/uL — ABNORMAL HIGH (ref 3.87–5.11)
RDW: 11.9 % (ref 11.5–15.5)
WBC: 11.1 10*3/uL — ABNORMAL HIGH (ref 4.0–10.5)
nRBC: 0 % (ref 0.0–0.2)

## 2020-05-20 LAB — BASIC METABOLIC PANEL
Anion gap: 10 (ref 5–15)
BUN: 11 mg/dL (ref 6–20)
CO2: 23 mmol/L (ref 22–32)
Calcium: 8.9 mg/dL (ref 8.9–10.3)
Chloride: 104 mmol/L (ref 98–111)
Creatinine, Ser: 0.72 mg/dL (ref 0.44–1.00)
GFR calc Af Amer: >60 mL/min (ref >60)
GFR calc non Af Amer: >60 mL/min (ref >60)
Glucose, Bld: 216 mg/dL — ABNORMAL HIGH (ref 70–99)
Potassium: 4.1 mmol/L (ref 3.5–5.1)
Sodium: 137 mmol/L (ref 135–145)

## 2020-05-20 LAB — SARS CORONAVIRUS 2 BY RT PCR (HOSPITAL ORDER, PERFORMED IN ~~LOC~~ HOSPITAL LAB): SARS Coronavirus 2: NEGATIVE

## 2020-05-20 LAB — HEMOGLOBIN A1C
Hgb A1c MFr Bld: 8 % — ABNORMAL HIGH (ref 4.8–5.6)
Mean Plasma Glucose: 182.9 mg/dL

## 2020-05-20 LAB — TROPONIN I (HIGH SENSITIVITY)
Troponin I (High Sensitivity): 2374 ng/L (ref <18)
Troponin I (High Sensitivity): 3653 ng/L (ref <18)

## 2020-05-20 LAB — POCT ACTIVATED CLOTTING TIME: Activated Clotting Time: 285 seconds

## 2020-05-20 LAB — TSH: TSH: 1.494 u[IU]/mL (ref 0.350–4.500)

## 2020-05-20 LAB — PREGNANCY, URINE: Preg Test, Ur: NEGATIVE

## 2020-05-20 SURGERY — LEFT HEART CATH AND CORONARY ANGIOGRAPHY
Anesthesia: LOCAL

## 2020-05-20 MED ORDER — NITROGLYCERIN 1 MG/10 ML FOR IR/CATH LAB
INTRA_ARTERIAL | Status: AC
  Filled 2020-05-20: qty 10

## 2020-05-20 MED ORDER — SODIUM CHLORIDE 0.9% FLUSH: 3.0000 mL | Freq: Two times a day (BID) | INTRAVENOUS | Status: DC

## 2020-05-20 MED ORDER — VERAPAMIL HCL 2.5 MG/ML IV SOLN
INTRAVENOUS | Status: DC | PRN
  Administered 2020-05-20: 10 mL via INTRA_ARTERIAL

## 2020-05-20 MED ORDER — LIDOCAINE HCL (PF) 1 % IJ SOLN
INTRAMUSCULAR | Status: AC
  Filled 2020-05-20: qty 30

## 2020-05-20 MED ORDER — NITROGLYCERIN IN D5W 200-5 MCG/ML-% IV SOLN
0.0000 ug/min | INTRAVENOUS | Status: DC
  Administered 2020-05-20: 5 ug/min via INTRAVENOUS
  Filled 2020-05-20: qty 250

## 2020-05-20 MED ORDER — HEPARIN BOLUS VIA INFUSION
3800.0000 [IU] | Freq: Once | INTRAVENOUS | Status: AC
  Administered 2020-05-20: 3800 [IU] via INTRAVENOUS

## 2020-05-20 MED ORDER — TICAGRELOR 90 MG PO TABS
ORAL_TABLET | ORAL | Status: AC
  Filled 2020-05-20: qty 1

## 2020-05-20 MED ORDER — SODIUM CHLORIDE 0.9% FLUSH: 3.0000 mL | INTRAVENOUS | Status: DC | PRN

## 2020-05-20 MED ORDER — BUPROPION HCL ER (XL) 150 MG PO TB24
150.0000 mg | ORAL_TABLET | ORAL | Status: DC
  Filled 2020-05-20 (×2): qty 1

## 2020-05-20 MED ORDER — SODIUM CHLORIDE 0.9 % WEIGHT BASED INFUSION: 1.0000 mL/kg/h | INTRAVENOUS | Status: DC

## 2020-05-20 MED ORDER — ONDANSETRON HCL 4 MG/2ML IJ SOLN
INTRAMUSCULAR | Status: AC
  Filled 2020-05-20: qty 2

## 2020-05-20 MED ORDER — TIROFIBAN HCL IN NACL 5-0.9 MG/100ML-% IV SOLN
0.1500 ug/kg/min | INTRAVENOUS | Status: AC
  Administered 2020-05-20: 0.15 ug/kg/min via INTRAVENOUS
  Filled 2020-05-20: qty 100

## 2020-05-20 MED ORDER — MIDAZOLAM HCL 2 MG/2ML IJ SOLN
INTRAMUSCULAR | Status: DC | PRN
  Administered 2020-05-20: 2 mg via INTRAVENOUS

## 2020-05-20 MED ORDER — ONDANSETRON HCL 4 MG/2ML IJ SOLN
4.0000 mg | Freq: Four times a day (QID) | INTRAMUSCULAR | Status: DC | PRN
  Administered 2020-05-20: 4 mg via INTRAVENOUS
  Filled 2020-05-20: qty 2

## 2020-05-20 MED ORDER — NITROGLYCERIN 0.4 MG SL SUBL: 0.4000 mg | SUBLINGUAL_TABLET | SUBLINGUAL | Status: DC | PRN

## 2020-05-20 MED ORDER — FENTANYL CITRATE (PF) 100 MCG/2ML IJ SOLN
INTRAMUSCULAR | Status: AC
  Filled 2020-05-20: qty 2

## 2020-05-20 MED ORDER — TIROFIBAN HCL IN NACL 5-0.9 MG/100ML-% IV SOLN
INTRAVENOUS | Status: AC | PRN
  Administered 2020-05-20: 0.15 ug/kg/min via INTRAVENOUS

## 2020-05-20 MED ORDER — SODIUM CHLORIDE 0.9 % IV SOLN
INTRAVENOUS | Status: AC | PRN
  Administered 2020-05-20: 250 mL via INTRAVENOUS

## 2020-05-20 MED ORDER — MIDAZOLAM HCL 2 MG/2ML IJ SOLN
INTRAMUSCULAR | Status: AC
  Filled 2020-05-20: qty 2

## 2020-05-20 MED ORDER — HEPARIN SODIUM (PORCINE) 1000 UNIT/ML IJ SOLN
INTRAMUSCULAR | Status: DC | PRN
  Administered 2020-05-20: 4000 [IU] via INTRAVENOUS
  Administered 2020-05-20: 6000 [IU] via INTRAVENOUS

## 2020-05-20 MED ORDER — HEPARIN (PORCINE) 25000 UT/250ML-% IV SOLN
750.0000 [IU]/h | INTRAVENOUS | Status: DC
  Administered 2020-05-20: 750 [IU]/h via INTRAVENOUS
  Filled 2020-05-20: qty 250

## 2020-05-20 MED ORDER — SODIUM CHLORIDE 0.9 % IV SOLN: INTRAVENOUS | Status: AC

## 2020-05-20 MED ORDER — GABAPENTIN 300 MG PO CAPS
300.0000 mg | ORAL_CAPSULE | Freq: Three times a day (TID) | ORAL | Status: DC
  Administered 2020-05-20 – 2020-05-22 (×5): 300 mg via ORAL
  Filled 2020-05-20 (×5): qty 1

## 2020-05-20 MED ORDER — SODIUM CHLORIDE 0.9% FLUSH
3.0000 mL | Freq: Two times a day (BID) | INTRAVENOUS | Status: DC
  Administered 2020-05-20 – 2020-05-22 (×4): 3 mL via INTRAVENOUS

## 2020-05-20 MED ORDER — SODIUM CHLORIDE 0.9 % WEIGHT BASED INFUSION: 3.0000 mL/kg/h | INTRAVENOUS | Status: DC

## 2020-05-20 MED ORDER — ONDANSETRON HCL 4 MG/2ML IJ SOLN: 4.0000 mg | Freq: Four times a day (QID) | INTRAMUSCULAR | Status: DC | PRN

## 2020-05-20 MED ORDER — FENTANYL CITRATE (PF) 100 MCG/2ML IJ SOLN
INTRAMUSCULAR | Status: DC | PRN
Start: 2020-05-20 — End: 2020-05-20
  Administered 2020-05-20: 50 ug via INTRAVENOUS

## 2020-05-20 MED ORDER — ASPIRIN 81 MG PO CHEW
324.0000 mg | CHEWABLE_TABLET | Freq: Once | ORAL | Status: AC
  Administered 2020-05-20: 324 mg via ORAL
  Filled 2020-05-20: qty 4

## 2020-05-20 MED ORDER — MORPHINE SULFATE (PF) 4 MG/ML IV SOLN
4.0000 mg | Freq: Once | INTRAVENOUS | Status: AC
  Administered 2020-05-20: 4 mg via INTRAVENOUS
  Filled 2020-05-20: qty 1

## 2020-05-20 MED ORDER — LABETALOL HCL 5 MG/ML IV SOLN: 10.0000 mg | INTRAVENOUS | Status: AC | PRN

## 2020-05-20 MED ORDER — HEPARIN (PORCINE) IN NACL 1000-0.9 UT/500ML-% IV SOLN
INTRAVENOUS | Status: DC | PRN
  Administered 2020-05-20 (×2): 500 mL

## 2020-05-20 MED ORDER — LIDOCAINE HCL (PF) 1 % IJ SOLN
INTRAMUSCULAR | Status: DC | PRN
  Administered 2020-05-20: 2 mL

## 2020-05-20 MED ORDER — TIROFIBAN HCL IN NACL 5-0.9 MG/100ML-% IV SOLN
INTRAVENOUS | Status: AC
  Filled 2020-05-20: qty 100

## 2020-05-20 MED ORDER — ASPIRIN EC 81 MG PO TBEC
81.0000 mg | DELAYED_RELEASE_TABLET | Freq: Every day | ORAL | Status: DC
  Administered 2020-05-21 – 2020-05-22 (×2): 81 mg via ORAL
  Filled 2020-05-20 (×2): qty 1

## 2020-05-20 MED ORDER — HYDRALAZINE HCL 20 MG/ML IJ SOLN: 10.0000 mg | INTRAMUSCULAR | Status: AC | PRN

## 2020-05-20 MED ORDER — HEPARIN (PORCINE) IN NACL 1000-0.9 UT/500ML-% IV SOLN
INTRAVENOUS | Status: AC
  Filled 2020-05-20: qty 1000

## 2020-05-20 MED ORDER — VERAPAMIL HCL 2.5 MG/ML IV SOLN
INTRAVENOUS | Status: AC
  Filled 2020-05-20: qty 2

## 2020-05-20 MED ORDER — HEPARIN SODIUM (PORCINE) 1000 UNIT/ML IJ SOLN
INTRAMUSCULAR | Status: AC
  Filled 2020-05-20: qty 1

## 2020-05-20 MED ORDER — ASPIRIN 81 MG PO CHEW: 81.0000 mg | CHEWABLE_TABLET | ORAL | Status: DC

## 2020-05-20 MED ORDER — VITAMIN D (ERGOCALCIFEROL) 1.25 MG (50000 UNIT) PO CAPS
50000.0000 [IU] | ORAL_CAPSULE | ORAL | Status: DC
  Filled 2020-05-20: qty 1

## 2020-05-20 MED ORDER — SODIUM CHLORIDE 0.9 % IV SOLN: 250.0000 mL | INTRAVENOUS | Status: DC | PRN

## 2020-05-20 MED ORDER — TICAGRELOR 90 MG PO TABS
ORAL_TABLET | ORAL | Status: DC | PRN
  Administered 2020-05-20: 180 mg via ORAL

## 2020-05-20 MED ORDER — ATORVASTATIN CALCIUM 80 MG PO TABS
80.0000 mg | ORAL_TABLET | Freq: Every day | ORAL | Status: DC
  Administered 2020-05-20 – 2020-05-22 (×3): 80 mg via ORAL
  Filled 2020-05-20 (×3): qty 1

## 2020-05-20 MED ORDER — ACETAMINOPHEN 325 MG PO TABS
650.0000 mg | ORAL_TABLET | ORAL | Status: DC | PRN
  Administered 2020-05-20: 650 mg via ORAL
  Filled 2020-05-20 (×2): qty 2

## 2020-05-20 MED ORDER — ONDANSETRON HCL 4 MG/2ML IJ SOLN
INTRAMUSCULAR | Status: DC | PRN
  Administered 2020-05-20: 4 mg via INTRAVENOUS

## 2020-05-20 MED ORDER — TIROFIBAN (AGGRASTAT) BOLUS VIA INFUSION
INTRAVENOUS | Status: DC | PRN
  Administered 2020-05-20: 2040 ug via INTRAVENOUS

## 2020-05-20 MED ORDER — TICAGRELOR 90 MG PO TABS
90.0000 mg | ORAL_TABLET | Freq: Two times a day (BID) | ORAL | Status: DC
  Administered 2020-05-21 – 2020-05-22 (×3): 90 mg via ORAL
  Filled 2020-05-20 (×3): qty 1

## 2020-05-20 MED ORDER — ACETAMINOPHEN 325 MG PO TABS: 650.0000 mg | ORAL_TABLET | ORAL | Status: DC | PRN

## 2020-05-20 MED ORDER — NITROGLYCERIN 1 MG/10 ML FOR IR/CATH LAB
INTRA_ARTERIAL | Status: DC | PRN
  Administered 2020-05-20: 100 ug via INTRACORONARY

## 2020-05-20 MED ORDER — IOHEXOL 350 MG/ML SOLN
INTRAVENOUS | Status: DC | PRN
  Administered 2020-05-20: 95 mL

## 2020-05-20 MED ORDER — NITROGLYCERIN 0.4 MG SL SUBL
0.4000 mg | SUBLINGUAL_TABLET | SUBLINGUAL | Status: DC | PRN
  Filled 2020-05-20: qty 1

## 2020-05-20 SURGICAL SUPPLY — 21 items
BALLN EMERGE MR 2.0X12 (BALLOONS) ×2
BALLN SAPPHIRE ~~LOC~~ 2.25X12 (BALLOONS) ×1 IMPLANT
BALLN SAPPHIRE ~~LOC~~ 2.75X15 (BALLOONS) ×1 IMPLANT
BALLOON EMERGE MR 2.0X12 (BALLOONS) IMPLANT
CATH 5FR JL3.5 JR4 ANG PIG MP (CATHETERS) ×1 IMPLANT
CATH LAUNCHER 6FR JR4 (CATHETERS) ×1 IMPLANT
DEVICE RAD COMP TR BAND LRG (VASCULAR PRODUCTS) ×1 IMPLANT
ELECT DEFIB PAD ADLT CADENCE (PAD) ×1 IMPLANT
GLIDESHEATH SLEND SS 6F .021 (SHEATH) ×1 IMPLANT
GUIDEWIRE INQWIRE 1.5J.035X260 (WIRE) IMPLANT
INQWIRE 1.5J .035X260CM (WIRE) ×2
KIT ENCORE 26 ADVANTAGE (KITS) ×1 IMPLANT
KIT HEART LEFT (KITS) ×2 IMPLANT
PACK CARDIAC CATHETERIZATION (CUSTOM PROCEDURE TRAY) ×2 IMPLANT
STENT SYNERGY XD 2.25X12 (Permanent Stent) IMPLANT
STENT SYNERGY XD 2.75X20 (Permanent Stent) IMPLANT
SYNERGY XD 2.25X12 (Permanent Stent) ×2 IMPLANT
SYNERGY XD 2.75X20 (Permanent Stent) ×2 IMPLANT
TRANSDUCER W/STOPCOCK (MISCELLANEOUS) ×2 IMPLANT
TUBING CIL FLEX 10 FLL-RA (TUBING) ×2 IMPLANT
WIRE COUGAR XT STRL 190CM (WIRE) ×1 IMPLANT

## 2020-05-20 NOTE — ED Provider Notes (Signed)
[facility] EMERGENCY DEPARTMENT Provider Note   CSN: [id number] Arrival date & time: 05/20/20  1106     History Chief Complaint  Patient presents with  . Chest Pain    Sarah Kane is a 49 y.o. female.  The history is provided by the patient.  Chest Pain Pain location:  Substernal area Pain quality: aching and pressure   Pain radiates to:  L arm Pain severity:  Mild Onset quality:  Gradual Duration:  10 hours Timing:  Constant Progression:  Unchanged Chronicity:  New Context: at rest   Relieved by:  Nothing Worsened by:  Nothing Associated symptoms: no abdominal pain, no back pain, no cough, no fever, no palpitations, no shortness of breath and no vomiting   Risk factors: diabetes mellitus and hypertension   Risk factors: no coronary artery disease and no prior DVT/PE        Past Medical History:  Diagnosis Date  . Arthritis   . Diabetes mellitus without complication (HCC)   . Hypertension     Patient Active Problem List   Diagnosis Date Noted  . NSTEMI (non-ST elevated myocardial infarction) (HCC) 05/20/2020  . Type 2 diabetes mellitus with hyperglycemia, without long-term current use of insulin (HCC) 10/23/2019  . Pain of skin 08/23/2019  . Essential hypertension 10/29/2018  . Tear of left supraspinatus tendon 08/14/2018  . 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  . Dupuytren's contracture of both hands 07/26/2018  . Chronic pain 06/28/2018  . ANA positive 06/28/2018  . Arthritis of left acromioclavicular joint 10/03/2017  . Subacromial bursitis 10/03/2017  . Carpal tunnel syndrome 03/06/2017  . Right hand pain 10/02/2016  . Complex regional pain syndrome 06/16/2016  . Trigger finger of right thumb 03/03/2016  . Joint synovitis 02/07/2016    Past Surgical History:  Procedure Laterality Date  . CESAREAN SECTION  1990  . TRIGGER FINGER RELEASE Right    thumb     OB History   No obstetric history on file.     Family History   Problem Relation Age of Onset  . High blood pressure Mother   . Diabetes Mother   . Diabetes Maternal Grandmother   . High blood pressure Maternal Grandmother   . Heart attack Maternal Grandfather   . Diabetes Maternal Grandfather   . High blood pressure Maternal Grandfather   . High blood pressure Paternal Grandmother   . High blood pressure Paternal Grandfather   . Diabetes Maternal Aunt   . Diabetes Maternal Uncle   . Skin cancer Maternal Uncle     Social History   Tobacco Use  . Smoking status: Never Smoker  . Smokeless tobacco: Never Used  Vaping Use  . Vaping Use: Never used  Substance Use Topics  . Alcohol use: No  . Drug use: No    Home Medications Prior to Admission medications   Medication Sig Start Date End Date Taking? Authorizing Provider  alendronate (FOSAMAX) 70 MG tablet Take 1 tablet (70 mg total) by mouth once a week. Take with a full glass of water on an empty stomach. 12/08/19   Sunnie Nielsen, DO  buPROPion (WELLBUTRIN XL) 150 MG 24 hr tablet Take 1 tablet (150 mg total) by mouth every morning. 01/13/20   Sunnie Nielsen, DO  gabapentin (NEURONTIN) 300 MG capsule TAKE 1 CAPSULE BY MOUTH THREE TIMES DAILY 04/08/20   Sunnie Nielsen, DO  glimepiride (AMARYL) 4 MG tablet TAKE 2 TABLETS BY MOUTH ONCE DAILY WITH BREAKFAST 05/04/20   Lyn Hollingshead,  Natalie, DO  losartan (COZAAR) 25 MG tablet Take 1 tablet (25 mg total) by mouth daily. 01/13/20   Sunnie Nielsen, DO  Semaglutide, 1 MG/DOSE, (OZEMPIC, 1 MG/DOSE,) 2 MG/1.5ML SOPN Inject 1 mg into the skin once a week. 07/23/19   Sunnie Nielsen, DO  sitaGLIPtin (JANUVIA) 100 MG tablet Take 1 tablet (100 mg total) by mouth daily. 01/13/20   Sunnie Nielsen, DO  valACYclovir (VALTREX) 1000 MG tablet Take 1 tablet (1,000 mg total) by mouth 3 (three) times daily. 10/23/19   Sunnie Nielsen, DO  Vitamin D, Ergocalciferol, (DRISDOL) 1.25 MG (50000 UNIT) CAPS capsule Take 1 capsule by mouth once a week 04/08/20    Sunnie Nielsen, DO    Allergies    Patient has no known allergies.  Review of Systems   Review of Systems  Constitutional: Negative for chills and fever.  HENT: Negative for ear pain and sore throat.   Eyes: Negative for pain and visual disturbance.  Respiratory: Negative for cough and shortness of breath.   Cardiovascular: Positive for chest pain. Negative for palpitations.  Gastrointestinal: Negative for abdominal pain and vomiting.  Genitourinary: Negative for dysuria and hematuria.  Musculoskeletal: Negative for arthralgias and back pain.  Skin: Negative for color change and rash.  Neurological: Negative for seizures and syncope.  All other systems reviewed and are negative.   Physical Exam Updated Vital Signs  ED Triage Vitals [05/20/20 1116]  Enc Vitals Group     BP 96/72     Pulse Rate 70     Resp 18     Temp 98.5 F (36.9 C)     Temp Source Oral     SpO2 100 %     Weight 180 lb (81.6 kg)     Height 5' (1.524 m)     Head Circumference      Peak Flow      Pain Score 8     Pain Loc      Pain Edu?      Excl. in GC?     Physical Exam Vitals and nursing note reviewed.  Constitutional:      General: She is not in acute distress.    Appearance: She is well-developed. She is not ill-appearing.  HENT:     Head: Normocephalic and atraumatic.  Eyes:     Conjunctiva/sclera: Conjunctivae normal.     Pupils: Pupils are equal, round, and reactive to light.  Cardiovascular:     Rate and Rhythm: Normal rate and regular rhythm.     Pulses:          Radial pulses are 2+ on the right side and 2+ on the left side.     Heart sounds: Normal heart sounds. No murmur heard.   Pulmonary:     Effort: Pulmonary effort is normal. No respiratory distress.     Breath sounds: Normal breath sounds. No decreased breath sounds, wheezing or rhonchi.  Abdominal:     Palpations: Abdomen is soft.     Tenderness: There is no abdominal tenderness.  Musculoskeletal:        General:  Normal range of motion.     Cervical back: Normal range of motion and neck supple.     Right lower leg: No edema.     Left lower leg: No edema.  Skin:    General: Skin is warm and dry.     Capillary Refill: Capillary refill takes less than 2 seconds.  Neurological:     General: No focal  deficit present.     Mental Status: She is alert.     ED Results / Procedures / Treatments   Labs (all labs ordered are listed, but only abnormal results are displayed) Labs Reviewed  BASIC METABOLIC PANEL - Abnormal; Notable for the following components:      Result Value   Glucose, Bld 216 (*)    All other components within normal limits  CBC - Abnormal; Notable for the following components:   WBC 11.1 (*)    RBC 5.14 (*)    All other components within normal limits  TROPONIN I (HIGH SENSITIVITY) - Abnormal; Notable for the following components:   Troponin I (High Sensitivity) 2,374 (*)    All other components within normal limits  SARS CORONAVIRUS 2 BY RT PCR (HOSPITAL ORDER, PERFORMED IN [facility])  PREGNANCY, URINE  HEPARIN LEVEL (UNFRACTIONATED)  TROPONIN I (HIGH SENSITIVITY)    EKG EKG Interpretation  Date/Time:  Thursday May 20 2020 11:20:46 EDT Ventricular Rate:  70 PR Interval:  146 QRS Duration: 68 QT Interval:  408 QTC Calculation: 440 R Axis:   82 Text Interpretation: Normal sinus rhythm Normal ECG Confirmed by Virgina Norfolk [phone]) on 05/20/2020 11:25:27 AM Also confirmed by Virgina Norfolk [phone])  on 05/20/2020 12:39:35 PM   Radiology DG Chest 2 View  Result Date: 05/20/2020 CLINICAL DATA:  Chest pain. EXAM: CHEST - 2 VIEW COMPARISON:  June 02, 2018. FINDINGS: The heart size and mediastinal contours are within normal limits. Both lungs are clear. No pneumothorax or pleural effusion is noted. The visualized skeletal structures are unremarkable. IMPRESSION: No active cardiopulmonary disease. Electronically Signed   By: Lupita Raider M.D.   On:  05/20/2020 11:55    Procedures .Critical Care Performed by: Virgina Norfolk, DO Authorized by: Virgina Norfolk, DO   Critical care provider statement:    Critical care time (minutes):  45   Critical care was necessary to treat or prevent imminent or life-threatening deterioration of the following conditions:  Cardiac failure   Critical care was time spent personally by me on the following activities:  Blood draw for specimens, development of treatment plan with patient or surrogate, discussions with primary provider, evaluation of patient's response to treatment, examination of patient, obtaining history from patient or surrogate, ordering and performing treatments and interventions, ordering and review of laboratory studies, ordering and review of radiographic studies, pulse oximetry, re-evaluation of patient's condition and review of old charts   I assumed direction of critical care for this patient from another provider in my specialty: no     (including critical care time)  Medications Ordered in ED Medications  heparin ADULT infusion 100 units/mL (25000 units/229mL sodium chloride 0.45%) (750 Units/hr Intravenous New Bag/Given 05/20/20 1307)  nitroGLYCERIN (NITROSTAT) SL tablet 0.4 mg (has no administration in time range)  aspirin chewable tablet 324 mg (324 mg Oral Given 05/20/20 1254)  morphine 4 MG/ML injection 4 mg (4 mg Intravenous Given 05/20/20 1302)  heparin bolus via infusion 3,800 Units (3,800 Units Intravenous Bolus from Bag 05/20/20 1308)    ED Course  I have reviewed the triage vital signs and the nursing notes.  Pertinent labs & imaging results that were available during my care of the patient were reviewed by me and considered in my medical decision making (see chart for details).    MDM Rules/Calculators/A&P  MELANNIE WEED is a 49 year old female with history of diabetes, hypertension who presents to the ED with chest pain.  EKG shows sinus  rhythm.  No ischemic changes.  May be some minimal ST elevation in lead III.  Patient had lab work drawn prior to my evaluation and troponin was positive at 2400.  Patient started to have chest discomfort around 3 AM and has been fairly persistent.  Still having chest pain now states that it is about a 6 out of 10.  No smoking history.  No significant cardiac history in the family.  Denies any drug use.  Chest x-ray shows no signs of pneumonia, no pneumothorax.  She has good pulses throughout on exam.  Pain is not consistent with dissection.  No PE risk factors.  Talked with Dr. Jerene Pitch with cardiology and will start patient on heparin bolus and infusion.  Will give aspirin.  Will treat pain with morphine and nitroglycerin.  Overall she appears stable at this time.  We will consider ED to ED transfer if patient has any type of decompensation or ongoing pain.  Right now will admit to stepdown but if she gets completely pain-free will consider floor telemetry bed per cardiology.  Otherwise lab work shows no significant anemia, electrolyte abnormality, kidney injury.  Chest pain is improving following morphine and nitroglycerin.  However she is not completely chest pain-free will start nitroglycerin infusion.  Believe she would be best off on stepdown unit at this time.  This chart was dictated using voice recognition software.  Despite best efforts to proofread,  errors can occur which can change the documentation meaning.    Final Clinical Impression(s) / ED Diagnoses Final diagnoses:  NSTEMI (non-ST elevated myocardial infarction) [facility])    Rx / DC Orders ED Discharge Orders    None       Virgina Norfolk, DO 05/20/20 1400

## 2020-05-20 NOTE — ED Triage Notes (Addendum)
Chest pain since last night. Found her BP to be high at work.

## 2020-05-20 NOTE — H&P (Addendum)
Cardiology Admission History and Physical:   Patient ID: Sarah Kane MRN: [id number]; DOB: 05/19/1971   Admission date: 05/20/2020  Primary Care Provider: Sunnie Nielsen, DO [facility] Cardiologist:New  Patient Profile:   Sarah Kane is a 49 y.o. female with hx with hx of HTN and DM presented for chest pain and found to have elevated troponin.  History of Present Illness:   Sarah Kane was in usual state of health up until last night while at work she started not to feel to herself.  She works third shift at [employer].  While at work last night, patient felt weak, fatigue and intermittent substernal chest pressure with shortness of breath.  Her blood pressure was elevated to 160 systolically.  Somehow she was managed to complete her shift.  She went home and her chest pressure exacerbated with shortness of breath and radiating to her left shoulder.  She was diaphoretic.  She went to med [facility] where noted elevated high-sensitivity troponin at 2374.  She was started on nitroglycerin drip and IV heparin.  Repeat troponin 3653.  Renal function and electrolytes are within normal limits.  Patient denies prior history of similar symptoms.  Denies tobacco smoking or alcohol abuse.  Reports maternal uncle has history of CAD. COVID negative.  CXR without acute disease.   Past Medical History:  Diagnosis Date  . Arthritis   . Diabetes mellitus without complication (HCC)   . Hypertension     Past Surgical History:  Procedure Laterality Date  . CESAREAN SECTION  1990  . TRIGGER FINGER RELEASE Right    thumb     Medications Prior to Admission: Prior to Admission medications   Medication Sig Start Date End Date Taking? Authorizing Provider  alendronate (FOSAMAX) 70 MG tablet Take 1 tablet (70 mg total) by mouth once a week. Take with a full glass of water on an empty stomach. 12/08/19   Sunnie Nielsen, DO  buPROPion (WELLBUTRIN XL) 150 MG 24 hr tablet Take 1 tablet (150 mg  total) by mouth every morning. 01/13/20   Sunnie Nielsen, DO  gabapentin (NEURONTIN) 300 MG capsule TAKE 1 CAPSULE BY MOUTH THREE TIMES DAILY 04/08/20   Sunnie Nielsen, DO  glimepiride (AMARYL) 4 MG tablet TAKE 2 TABLETS BY MOUTH ONCE DAILY WITH BREAKFAST 05/04/20   Sunnie Nielsen, DO  losartan (COZAAR) 25 MG tablet Take 1 tablet (25 mg total) by mouth daily. 01/13/20   Sunnie Nielsen, DO  Semaglutide, 1 MG/DOSE, (OZEMPIC, 1 MG/DOSE,) 2 MG/1.5ML SOPN Inject 1 mg into the skin once a week. 07/23/19   Sunnie Nielsen, DO  sitaGLIPtin (JANUVIA) 100 MG tablet Take 1 tablet (100 mg total) by mouth daily. 01/13/20   Sunnie Nielsen, DO  valACYclovir (VALTREX) 1000 MG tablet Take 1 tablet (1,000 mg total) by mouth 3 (three) times daily. 10/23/19   Sunnie Nielsen, DO  Vitamin D, Ergocalciferol, (DRISDOL) 1.25 MG (50000 UNIT) CAPS capsule Take 1 capsule by mouth once a week 04/08/20   Sunnie Nielsen, DO     Allergies:   No Known Allergies  Social History:   Social History   Socioeconomic History  . Marital status: Divorced    Spouse name: Not on file  . Number of children: 3  . Years of education: Not on file  . Highest education level: Not on file  Occupational History    Employer: [employer]  Tobacco Use  . Smoking status: Never Smoker  . Smokeless tobacco: Never Used  Vaping Use  . Vaping Use: Never used  Substance and Sexual Activity  . Alcohol use: No  . Drug use: No  . Sexual activity: Not Currently    Partners: Male  Other Topics Concern  . Not on file  Social History Narrative  . Not on file   Social Determinants of Health   Financial Resource Strain:   . Difficulty of Paying Living Expenses: Not on file  Food Insecurity:   . Worried About Programme researcher, broadcasting/film/video in the Last Year: Not on file  . Ran Out of Food in the Last Year: Not on file  Transportation Needs:   . Lack of Transportation (Medical): Not on file  . Lack of  Transportation (Non-Medical): Not on file  Physical Activity:   . Days of Exercise per Week: Not on file  . Minutes of Exercise per Session: Not on file  Stress:   . Feeling of Stress : Not on file  Social Connections:   . Frequency of Communication with Friends and Family: Not on file  . Frequency of Social Gatherings with Friends and Family: Not on file  . Attends Religious Services: Not on file  . Active Member of Clubs or Organizations: Not on file  . Attends Banker Meetings: Not on file  . Marital Status: Not on file  Intimate Partner Violence:   . Fear of Current or Ex-Partner: Not on file  . Emotionally Abused: Not on file  . Physically Abused: Not on file  . Sexually Abused: Not on file    Family History:   The patient's family history includes Diabetes in her maternal aunt, maternal grandfather, maternal grandmother, maternal uncle, and mother; Heart attack in her maternal grandfather; High blood pressure in her maternal grandfather, maternal grandmother, mother, paternal grandfather, and paternal grandmother; Skin cancer in her maternal uncle.    ROS:  Please see the history of present illness.  All other ROS reviewed and negative.     Physical Exam/Data:   Vitals:   05/20/20 1116 05/20/20 1330 05/20/20 1400 05/20/20 1500  BP: 96/72 (!) 137/94 (!) 134/94 126/89  Pulse: 70 99 80 78  Resp: 18 20 18 14   Temp: 98.5 F (36.9 C)     TempSrc: Oral     SpO2: 100% 100% 100% 99%  Weight: 81.6 kg     Height: 5' (1.524 m)      No intake or output data in the 24 hours ending 05/20/20 1637 Last 3 Weights 05/20/2020 01/13/2020 11/06/2019  Weight (lbs) 180 lb 178 lb 178 lb  Weight (kg) 81.647 kg 80.74 kg 80.74 kg     Body mass index is 35.15 kg/m.  General:  Well nourished, well developed, in no acute distress HEENT: normal Lymph: no adenopathy Neck: no JVD Endocrine:  No thryomegaly Vascular: No carotid bruits; FA pulses 2+ bilaterally without bruits    Cardiac:  normal S1, S2; RRR; no murmur  Lungs:  clear to auscultation bilaterally, no wheezing, rhonchi or rales  Abd: soft, nontender, no hepatomegaly  Ext: no  edema Musculoskeletal:  No deformities, BUE and BLE strength normal and equal Skin: warm and dry  Neuro:  CNs 2-12 intact, no focal abnormalities noted Psych:  Normal affect    EKG:  The ECG that was done today  was personally reviewed and demonstrates Normal sinus rhythm with minimal ST elevated inferiorly   Relevant CV Studies: None  Laboratory Data:  High Sensitivity Troponin:   Recent Labs  Lab  05/20/20 1126 05/20/20 1341  TROPONINIHS 2,374* 3,653*      Chemistry Recent Labs  Lab 05/20/20 1126  NA 137  K 4.1  CL 104  CO2 23  GLUCOSE 216*  BUN 11  CREATININE 0.72  CALCIUM 8.9  GFRNONAA >60  GFRAA >60  ANIONGAP 10    Hematology Recent Labs  Lab 05/20/20 1126  WBC 11.1*  RBC 5.14*  HGB 14.8  HCT 43.9  MCV 85.4  MCH 28.8  MCHC 33.7  RDW 11.9  PLT 246   Radiology/Studies:  DG Chest 2 View  Result Date: 05/20/2020 CLINICAL DATA:  Chest pain. EXAM: CHEST - 2 VIEW COMPARISON:  June 02, 2018. FINDINGS: The heart size and mediastinal contours are within normal limits. Both lungs are clear. No pneumothorax or pleural effusion is noted. The visualized skeletal structures are unremarkable. IMPRESSION: No active cardiopulmonary disease. Electronically Signed   By: Lupita Raider M.D.   On: 05/20/2020 11:55  [id number]} TIMI Risk Score for Unstable Angina or Non-ST Elevation MI:   The patient's TIMI risk score is 3, which indicates a 13% risk of all cause mortality, new or recurrent myocardial infarction or need for urgent revascularization in the next 14 days.   Assessment and Plan:   1. NSTEMI -Cardiac risk factors includes history of diabetes, hypertension and family history of CAD. -Mild chest tightness overnight at work however exacerbated at home with shortness of breath. - Hs troponin  [phone] - EKG with minimal ST elevation inferiorly - Got ASA 324 - continue IV heparin and nitro gtt - Ongoing pain - Plan cath later today - Echocardiogram   2. SM - SSI here  3. HTN - Follow    Severity of Illness: The appropriate patient status for this patient is INPATIENT. Inpatient status is judged to be reasonable and necessary in order to provide the required intensity of service to ensure the patient's safety. The patient's presenting symptoms, physical exam findings, and initial radiographic and laboratory data in the context of their chronic comorbidities is felt to place them at high risk for further clinical deterioration. Furthermore, it is not anticipated that the patient will be medically stable for discharge from the hospital within 2 midnights of admission. The following factors support the patient status of inpatient.   " The patient's presenting symptoms include Chest pain and SOB. " The worrisome physical exam findings includeNone " The initial radiographic and laboratory data are worrisome because of elevated troponin  " The chronic co-morbidities include DM and HTN   * I certify that at the point of admission it is my clinical judgment that the patient will require inpatient hospital care spanning beyond 2 midnights from the point of admission due to high intensity of service, high risk for further deterioration and high frequency of surveillance required.*    For questions or updates, please contact [facility] Please consult [email or web address] for contact info under     Signed, Manson Passey, PA  05/20/2020 4:37 PM   Patient seen and examined   Hx of HTN and DM with neuropathy  Presents with Chest pressure   Troponin elevated  Second trop over 3000  Pt continues to have chest presure   Lungs are CTA  Cardiac RRR  No rubs  No S3 No mrumurs Abd supple Ext without edema   Decreased sensation  EKG with subtle inferior cahnges (elevation)  Recomm :   L heart cath  Risks/benefits described for cath and poss PTCA/ Stent  Pt  undrerstands and agrees to proceed  Dietrich Pates MD

## 2020-05-20 NOTE — Interval H&P Note (Signed)
History and Physical Interval Note:  05/20/2020 5:11 PM  Sarah Kane  has presented today for surgery, with the diagnosis of nonstemi.  The various methods of treatment have been discussed with the patient and family. After consideration of risks, benefits and other options for treatment, the patient has consented to  Procedure(s): LEFT HEART CATH AND CORONARY ANGIOGRAPHY (N/A) as a surgical intervention.  The patient's history has been reviewed, patient examined, no change in status, stable for surgery.  I have reviewed the patient's chart and labs.  Questions were answered to the patient's satisfaction.    Cath Lab Visit (complete for each Cath Lab visit)  Clinical Evaluation Leading to the Procedure:   ACS: Yes.    Non-ACS:    Anginal Classification: CCS IV  Anti-ischemic medical therapy: No Therapy  Non-Invasive Test Results: No non-invasive testing performed  Prior CABG: No previous CABG        Verne Carrow

## 2020-05-20 NOTE — Progress Notes (Signed)
p ANTICOAGULATION CONSULT NOTE - Initial Consult  Pharmacy Consult for heparin Indication: chest pain/ACS  No Known Allergies  Patient Measurements: Height: 5' (152.4 cm) Weight: 81.6 kg (180 lb) IBW/kg (Calculated) : 45.5 Heparin Dosing Weight: 64.3kg  Vital Signs: Temp: 98.5 F (36.9 C) (08/26 1116) Temp Source: Oral (08/26 1116) BP: 96/72 (08/26 1116) Pulse Rate: 70 (08/26 1116)  Labs: Recent Labs    05/20/20 1126  HGB 14.8  HCT 43.9  PLT 246  CREATININE 0.72  TROPONINIHS 2,374*    Estimated Creatinine Clearance: 79.6 mL/min (by C-G formula based on SCr of 0.72 mg/dL).   Medical History: Past Medical History:  Diagnosis Date  . Arthritis   . Diabetes mellitus without complication (HCC)   . Hypertension    Assessment: 49 YOF presenting with CP, elevated troponin.  CBC wnl, not on anticoagulation PTA.    Goal of Therapy:  Heparin level 0.3-0.7 units/ml Monitor platelets by anticoagulation protocol: Yes   Plan:  Heparin 3800 IV x 1, and gtt at 750 units/hr F/u 6 hour heparin level  Daylene Posey, PharmD Clinical Pharmacist ED Pharmacist Phone # [phone] 05/20/2020 12:55 PM

## 2020-05-21 ENCOUNTER — Encounter (HOSPITAL_COMMUNITY): Payer: Self-pay | Admitting: Cardiovascular Disease

## 2020-05-21 ENCOUNTER — Inpatient Hospital Stay (HOSPITAL_COMMUNITY): Payer: BC Managed Care – PPO

## 2020-05-21 ENCOUNTER — Encounter (HOSPITAL_COMMUNITY)
Admission: EM | Disposition: A | Discharge status: Home / Self Care | Payer: Self-pay | Source: Home / Self Care | Attending: Cardiology

## 2020-05-21 ENCOUNTER — Telehealth: Payer: Self-pay | Admitting: Internal Medicine

## 2020-05-21 ENCOUNTER — Other Ambulatory Visit: Payer: Self-pay

## 2020-05-21 DIAGNOSIS — R079 Chest pain, unspecified: Secondary | ICD-10-CM

## 2020-05-21 LAB — ECHOCARDIOGRAM COMPLETE
Area-P 1/2: 5.13 cm2
Height: 60 in
S' Lateral: 3.63 cm
Single Plane A4C EF: 54.2 %
Weight: 2798.4 oz

## 2020-05-21 LAB — TROPONIN I (HIGH SENSITIVITY)
Troponin I (High Sensitivity): 13919 ng/L (ref <18)
Troponin I (High Sensitivity): 9414 ng/L (ref <18)

## 2020-05-21 LAB — GLUCOSE, CAPILLARY
Glucose-Capillary: 126 mg/dL — ABNORMAL HIGH (ref 70–99)
Glucose-Capillary: 134 mg/dL — ABNORMAL HIGH (ref 70–99)
Glucose-Capillary: 190 mg/dL — ABNORMAL HIGH (ref 70–99)
Glucose-Capillary: 179 mg/dL — ABNORMAL HIGH (ref 70–99)

## 2020-05-21 LAB — BASIC METABOLIC PANEL
Anion gap: 8 (ref 5–15)
BUN: 8 mg/dL (ref 6–20)
CO2: 24 mmol/L (ref 22–32)
Calcium: 8.9 mg/dL (ref 8.9–10.3)
Chloride: 105 mmol/L (ref 98–111)
Creatinine, Ser: 0.84 mg/dL (ref 0.44–1.00)
GFR calc Af Amer: >60 mL/min (ref >60)
GFR calc non Af Amer: >60 mL/min (ref >60)
Glucose, Bld: 171 mg/dL — ABNORMAL HIGH (ref 70–99)
Potassium: 4.4 mmol/L (ref 3.5–5.1)
Sodium: 137 mmol/L (ref 135–145)

## 2020-05-21 LAB — CBC
HCT: 42.2 % (ref 36.0–46.0)
Hemoglobin: 13.7 g/dL (ref 12.0–15.0)
MCH: 28.9 pg (ref 26.0–34.0)
MCHC: 32.5 g/dL (ref 30.0–36.0)
MCV: 89 fL (ref 80.0–100.0)
Platelets: 178 10*3/uL (ref 150–400)
RBC: 4.74 MIL/uL (ref 3.87–5.11)
RDW: 12.2 % (ref 11.5–15.5)
WBC: 9.9 10*3/uL (ref 4.0–10.5)
nRBC: 0 % (ref 0.0–0.2)

## 2020-05-21 LAB — LIPID PANEL
Cholesterol: 174 mg/dL (ref 0–200)
HDL: 42 mg/dL (ref >40)
LDL Cholesterol: 102 mg/dL — ABNORMAL HIGH (ref 0–99)
Total CHOL/HDL Ratio: 4.1 RATIO
Triglycerides: 148 mg/dL (ref <150)
VLDL: 30 mg/dL (ref 0–40)

## 2020-05-21 SURGERY — CORONARY STENT INTERVENTION
Anesthesia: LOCAL

## 2020-05-21 MED ORDER — TRAMADOL HCL 50 MG PO TABS
100.0000 mg | ORAL_TABLET | Freq: Four times a day (QID) | ORAL | Status: DC | PRN
  Administered 2020-05-21: 100 mg via ORAL
  Filled 2020-05-21: qty 2

## 2020-05-21 MED ORDER — INSULIN ASPART 100 UNIT/ML ~~LOC~~ SOLN
0.0000 [IU] | Freq: Three times a day (TID) | SUBCUTANEOUS | Status: DC
  Administered 2020-05-21: 2 [IU] via SUBCUTANEOUS
  Administered 2020-05-21: 1 [IU] via SUBCUTANEOUS
  Administered 2020-05-22: 2 [IU] via SUBCUTANEOUS

## 2020-05-21 MED ORDER — ACETAMINOPHEN 325 MG PO TABS: 650.0000 mg | ORAL_TABLET | ORAL | Status: DC | PRN

## 2020-05-21 MED ORDER — METOPROLOL SUCCINATE ER 25 MG PO TB24: 25.0000 mg | ORAL_TABLET | Freq: Two times a day (BID) | ORAL | 6 refills | Status: DC

## 2020-05-21 MED ORDER — LORAZEPAM 2 MG/ML IJ SOLN
0.5000 mg | Freq: Four times a day (QID) | INTRAMUSCULAR | Status: DC | PRN
  Administered 2020-05-21: 0.5 mg via INTRAVENOUS
  Filled 2020-05-21: qty 1

## 2020-05-21 MED ORDER — TICAGRELOR 90 MG PO TABS: 90.0000 mg | ORAL_TABLET | Freq: Two times a day (BID) | ORAL | 11 refills | Status: DC

## 2020-05-21 MED ORDER — ASPIRIN 81 MG PO TBEC: 81.0000 mg | DELAYED_RELEASE_TABLET | Freq: Every day | ORAL | 11 refills | Status: DC

## 2020-05-21 MED ORDER — ATORVASTATIN CALCIUM 80 MG PO TABS: 80.0000 mg | ORAL_TABLET | Freq: Every day | ORAL | 6 refills | Status: DC

## 2020-05-21 MED ORDER — INSULIN ASPART 100 UNIT/ML ~~LOC~~ SOLN: 0.0000 [IU] | Freq: Every day | SUBCUTANEOUS | Status: DC

## 2020-05-21 MED ORDER — NITROGLYCERIN 0.4 MG SL SUBL: 0.4000 mg | SUBLINGUAL_TABLET | SUBLINGUAL | 4 refills | Status: DC | PRN

## 2020-05-21 MED ORDER — METOPROLOL SUCCINATE ER 25 MG PO TB24
25.0000 mg | ORAL_TABLET | Freq: Two times a day (BID) | ORAL | Status: DC
  Administered 2020-05-21 – 2020-05-22 (×2): 25 mg via ORAL
  Filled 2020-05-21 (×2): qty 1

## 2020-05-21 MED FILL — NITROGLYCERIN 0.4 MG TAB SL: 0.4 | 7 days supply | Qty: 25 | Fill #0

## 2020-05-21 MED FILL — BRILINTA 90 MG TABLET: 90 | 30 days supply | Qty: 60 | Fill #0

## 2020-05-21 MED FILL — ASPIRIN LOW DOSE 81 MG TBEC: 81 | 30 days supply | Qty: 30 | Fill #0

## 2020-05-21 MED FILL — METOPROLOL SUCCINATE ER 25: 25 | 30 days supply | Qty: 60 | Fill #0

## 2020-05-21 MED FILL — ATORVASTATIN CALCIUM 80 MG: 80 | 30 days supply | Qty: 30 | Fill #0

## 2020-05-21 NOTE — Progress Notes (Signed)
Nutrition Education Note  RD consulted for nutrition education regarding heart health and diabetes.  Lab Results  Component Value Date   HGBA1C 8.0 (H) 05/20/2020     RD attached "Heart Healthy, Consistent Carbohydrate Nutrition Therapy" handout from the Academy of Nutrition and Dietetics to discharge instructions. Spoke with patient over the phone and reviewed patient's dietary recall. Provided examples on ways to decrease sodium intake in diet. Discouraged intake of processed foods and use of salt shaker. Encouraged fresh fruits and vegetables as well as whole grain sources of carbohydrates to maximize fiber intake.   Patient c/o not eating healthy because she does not have enough money. 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 She eats a lot of macaroni and cheese, Cheetos, and ravioli or some sort of pasta. She does not like the taste of fruits and vegetables. Encouraged patient to try new fruits and vegetables as she can find fruits and vegetables that are no more expensive than the unhealthy foods she has been purchasing. Patient not willing to include fruits and vegetables in her diet.    Discussed importance of controlled and consistent carbohydrate intake throughout the day. Provided examples of ways to balance meals/snacks and encouraged intake of high-fiber, whole grain complex carbohydrates. Teach back method used.  Expect poor compliance.  Body mass index is 34.16 kg/m. Pt meets criteria for obesity based on current BMI.  Current diet order is NPO for a procedure today. Labs and medications reviewed. No further nutrition interventions warranted at this time. RD contact information provided. If additional nutrition issues arise, please re-consult RD.    Gabriel Rainwater, RD, LDN, CNSC Please refer to [facility] for contact information.

## 2020-05-21 NOTE — Progress Notes (Signed)
CRITICAL VALUE ALERT  Critical Value:  13,919  Date & Time Notied:  05/21/20 1200hrs  Provider Notified: NP Annie Paras  Orders Received/Actions taken: no new orders at this time, monitor for CP.

## 2020-05-21 NOTE — Progress Notes (Signed)
Progress Note  Patient Name: Sarah Kane Date of Encounter: 05/21/2020  [facility] Cardiologist:  New  Subjective   Breathing OK  No CP   Inpatient Medications    Scheduled Meds: . aspirin EC  81 mg Oral Daily  . atorvastatin  80 mg Oral Daily  . buPROPion  150 mg Oral [id number]  . gabapentin  300 mg Oral TID  . sodium chloride flush  3 mL Intravenous Q12H  . ticagrelor  90 mg Oral BID  . Vitamin D (Ergocalciferol)  50,000 Units Oral Q Fri   Continuous Infusions: . sodium chloride    . nitroGLYCERIN Stopped (05/20/20 1927)   PRN Meds: sodium chloride, acetaminophen, nitroGLYCERIN, ondansetron (ZOFRAN) IV, sodium chloride flush   Vital Signs    Vitals:   05/20/20 2228 05/20/20 2316 05/21/20 0008 05/21/20 0116  BP:  94/63  (!) 89/63  Pulse:  94    Resp:  16    Temp: 98.6 F (37 C)  98.8 F (37.1 C)   TempSrc: Oral  Oral   SpO2:  96%    Weight:      Height:        Intake/Output Summary (Last 24 hours) at 05/21/2020 0344 Last data filed at 05/21/2020 0000 Gross per 24 hour  Intake 684.87 ml  Output --  Net 684.87 ml   Last 3 Weights 05/20/2020 01/13/2020 11/06/2019  Weight (lbs) 180 lb 178 lb 178 lb  Weight (kg) 81.647 kg 80.74 kg 80.74 kg      Telemetry     SR - Personally Reviewed  ECG    NOt released  Personally Reviewed  Physical Exam   GEN: No acute distress.   Neck: No JVD Cardiac: RRR, no murmurs, rubs, or gallops.  Respiratory: Clear to auscultation bilaterally. GI: Soft, nontender, non-distended  MS: No edema; No deformity.  R wrist with dry bandages   Neuro:  Nonfocal  Psych: Normal affect   Labs    High Sensitivity Troponin:   Recent Labs  Lab 05/20/20 1126 05/20/20 1341  TROPONINIHS 2,374* 3,653*      Chemistry Recent Labs  Lab 05/20/20 1126  NA 137  K 4.1  CL 104  CO2 23  GLUCOSE 216*  BUN 11  CREATININE 0.72  CALCIUM 8.9  GFRNONAA >60  GFRAA >60  ANIONGAP 10     Hematology Recent Labs  Lab  05/20/20 1126  WBC 11.1*  RBC 5.14*  HGB 14.8  HCT 43.9  MCV 85.4  MCH 28.8  MCHC 33.7  RDW 11.9  PLT 246    BNPNo results for input(s): BNP, PROBNP in the last 168 hours.   DDimer No results for input(s): DDIMER in the last 168 hours.   Radiology    DG Chest 2 View  Result Date: 05/20/2020 CLINICAL DATA:  Chest pain. EXAM: CHEST - 2 VIEW COMPARISON:  June 02, 2018. FINDINGS: The heart size and mediastinal contours are within normal limits. Both lungs are clear. No pneumothorax or pleural effusion is noted. The visualized skeletal structures are unremarkable. IMPRESSION: No active cardiopulmonary disease. Electronically Signed   By: Lupita Raider M.D.   On: 05/20/2020 11:55   CARDIAC CATHETERIZATION  Result Date: 05/20/2020  Prox RCA lesion is 30% stenosed.  Mid RCA lesion is 100% stenosed.  Prox Cx to Mid Cx lesion is 70% stenosed.  Dist LAD lesion is 99% stenosed.  Prox LAD to Dist LAD lesion is 30% stenosed.  A drug-eluting stent was successfully  placed using a SYNERGY XD 2.75X20.  Post intervention, there is a 0% residual stenosis.  A drug-eluting stent was successfully placed using a SYNERGY XD 2.25X12.  Dist RCA-1 lesion is 70% stenosed.  Post intervention, there is a 0% residual stenosis.  Dist RCA-2 lesion is 70% stenosed with 70% stenosed side branch in RPAV.  1. Acute inferior MT/NSTEMI secondary to occlusion of the mid RCA. This is a dominant vessel. 2. Successful PTCA/DES x 2 mid and distal RCA 3. The LAD is a moderate caliber vessel that courses to the apex. The distal vessel is diabetic appearing, small in caliber and diffusely disease. Not a target for PCI. 4. 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 Recommendations: Pt pain free post PCI. Will continue Aggrastat for 2 hours post PCI. Continue DAPT with ASA and Brilinta for one year. Echo in am. Continue high intensity statin. Staged PCI of the Circumflex possibly tomorrow. Her distal vessel are  diffusely diseased with diabetic appearance and are not targets for PCI.    Cardiac Studies   Cardiac Cath:  05/20/20   Prox RCA lesion is 30% stenosed.  Mid RCA lesion is 100% stenosed.  Prox Cx to Mid Cx lesion is 70% stenosed.  Dist LAD lesion is 99% stenosed.  Prox LAD to Dist LAD lesion is 30% stenosed.  A drug-eluting stent was successfully placed using a SYNERGY XD 2.75X20.  Post intervention, there is a 0% residual stenosis.  A drug-eluting stent was successfully placed using a SYNERGY XD 2.25X12.  Dist RCA-1 lesion is 70% stenosed.  Post intervention, there is a 0% residual stenosis.  Dist RCA-2 lesion is 70% stenosed with 70% stenosed side branch in RPAV.   1. Acute inferior MT/NSTEMI secondary to occlusion of the mid RCA. This is a dominant vessel.  2. Successful PTCA/DES x 2 mid and distal RCA 3. The LAD is a moderate caliber vessel that courses to the apex. The distal vessel is diabetic appearing, small in caliber and diffusely disease. Not a target for PCI.  4. 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   Recommendations: Pt pain free post PCI. Will continue Aggrastat for 2 hours post PCI. Continue DAPT with ASA and Brilinta for one year. Echo in am. Continue high intensity statin. Staged PCI of the Circumflex possibly tomorrow. Her distal vessel are diffusely diseased with diabetic appearance and are not targets for PCI.     Patient Profile     49 y.o. female hx of DM (complicated by perip neuropathy) admitted with CP and elevated troponin.    Assessment & Plan    1  CAD  Cath as ntoed above   Pt is s/p ptca/ DES x 2 to the RCA   LCx with stenosis   I have reviewed with   Plan for echo today   Cehck 1 more troponin   Keep on ASA and Brilinta for 1 yeare  High dose statin Review with interventional service   They would recomm medical Rx for now not an automatic staged intervnetion of L Cx    If symptomatic then would consider WIll ambulate a  little later today  Start cardiac rehab lessons   2   HL   LIpids:  LDL 102  HDL 42    High dose statin   3  DM   Hgb A1C  8.0  Needs tighter control   Will have dietary follow  Possilbe home tomorrow.   For questions or updates, please contact [facility]  Please consult [email or web address] for contact info under        Signed, Dietrich Pates, MD  05/21/2020, 3:44 AM

## 2020-05-21 NOTE — Plan of Care (Signed)
  Problem: Cardiovascular: Goal: Vascular access site(s) Level 0-1 will be maintained Outcome: Progressing   

## 2020-05-21 NOTE — Progress Notes (Signed)
  Echocardiogram 2D Echocardiogram has been performed.  Augustine Radar 05/21/2020, 11:22 AM

## 2020-05-21 NOTE — Care Management (Addendum)
1243 05-21-20 Case Manager received call from Cardiology NP that Brilinta will be expensive. Benefits check submitted and TOC was able to get the price of $377.03- Case Manager will provide patient with Brilinta discount card and hopefully price will be affordable. Gala Lewandowsky, RN,BSN Case Manager [phone]   05-21-20 1300 Case Manager spoke with patient and patient feels that even when she uses the discount card she may cannot afford the medication. Patient is aware that she may need to ask for Plavix or the cardiology office may have samples. Patient is aware of the [facility] for primary care and pharmacy assistance. Patient in process of calling her job [employer] to make them aware of the hospitalization. No further needs at this time. Gala Lewandowsky, RN,BSN Case Manager

## 2020-05-21 NOTE — Progress Notes (Signed)
[phone] MI education completed with pt and son except for ex ed. Did not walk with pt as for staged PCI. Reviewed MI restrictions, NTG use, carb counting and heart healthy food choices, importance of brilinta with stent, and CRP 2. Referral will be sent to [facility] CRP 2. Pt tearful at times due to afraid she will lose her jobs, not be able to afford her meds. Emotional support given and encouraged pt to help herself be as healthy as possible by getting A1C down from 8. Pt stated she does not eat vegetables or fruits. Likes green beans if her daughter cooks them.  Encouraged pt to try to find some healthier choices than just meat and potatoes and mac and cheese. Son in room and encouraged mother to be more open with her diet. We will follow up tomorrow and ambulate and give ex ed.  Highly encouraged pt to attend CRP 2 if insurance will cover. Graylon Good RN BSN 05/21/2020 9:22 AM

## 2020-05-21 NOTE — Telephone Encounter (Signed)
Per Nada Boozer, TOC Visit set up from 06/07/20 at 11:15 am with Norma Fredrickson

## 2020-05-22 DIAGNOSIS — E785 Hyperlipidemia, unspecified: Secondary | ICD-10-CM

## 2020-05-22 DIAGNOSIS — I2510 Atherosclerotic heart disease of native coronary artery without angina pectoris: Secondary | ICD-10-CM

## 2020-05-22 DIAGNOSIS — I255 Ischemic cardiomyopathy: Secondary | ICD-10-CM

## 2020-05-22 LAB — GLUCOSE, CAPILLARY: Glucose-Capillary: 177 mg/dL — ABNORMAL HIGH (ref 70–99)

## 2020-05-22 NOTE — Discharge Instructions (Signed)
PLEASE REMEMBER TO BRING ALL OF YOUR MEDICATIONS TO EACH OF YOUR FOLLOW-UP OFFICE VISITS.  PLEASE ATTEND ALL SCHEDULED FOLLOW-UP APPOINTMENTS.   Activity: Increase activity slowly as tolerated. You may shower, but no soaking baths (or swimming) for 1 week. No driving for 24 hours. No lifting over 5 lbs for 1 week. No sexual activity for 1 week.   Wound Care: You may wash cath site gently with soap and water. Keep cath site clean and dry. If you notice pain, swelling, bleeding or pus at your cath site, please call [phone].   Do not stop Brilinta and Asprin - these medications keep the stent open.  Stopping could cause a heart attack.    Nitro instructions: Take 1 NTG, under your tongue, while sitting.  If no relief of pain may repeat NTG, one tab every 5 minutes up to 3 tablets total over 15 minutes.  If no relief CALL 911.  If you have dizziness/lightheadness  while taking NTG, stop taking and call 911.         Carbohydrate Counting For People With Diabetes  Foods with carbohydrates make your blood glucose level go up. Learning how to count carbohydrates can help you control your blood glucose levels. First, identify the foods you eat that contain carbohydrates. Then, using the Foods with Carbohydrates chart, determine about how much carbohydrates are in your meals and snacks. Make sure you are eating foods with fiber, protein, and healthy fat along with your carbohydrate foods. Foods with Carbohydrates The following table shows carbohydrate foods that have about 15 grams of carbohydrate each. Using measuring cups, spoons, or a food scale when you first begin learning about carbohydrate counting can help you learn about the portion sizes you typically eat. The following foods have 15 grams carbohydrate each:  Grains . 1 slice bread (1 ounce)  . 1 small tortilla (6-inch size)  .  large bagel (1 ounce)  . 1/3 cup pasta or rice (cooked)  .  hamburger or hot dog bun ( ounce)  .  cup  cooked cereal  .  to  cup ready-to-eat cereal  . 2 taco shells (5-inch size) Fruit . 1 small fresh fruit ( to 1 cup)  .  medium banana  . 17 small grapes (3 ounces)  . 1 cup melon or berries  .  cup canned or frozen fruit  . 2 tablespoons dried fruit (blueberries, cherries, cranberries, raisins)  .  cup unsweetened fruit juice  Starchy Vegetables .  cup cooked beans, peas, corn, potatoes/sweet potatoes  .  large baked potato (3 ounces)  . 1 cup acorn or butternut squash  Snack Foods . 3 to 6 crackers  . 8 potato chips or 13 tortilla chips ( ounce to 1 ounce)  . 3 cups popped popcorn  Dairy . 3/4 cup (6 ounces) nonfat plain yogurt, or yogurt with sugar-free sweetener  . 1 cup milk  . 1 cup plain rice, soy, coconut or flavored almond milk Sweets and Desserts .  cup ice cream or frozen yogurt  . 1 tablespoon jam, jelly, pancake syrup, table sugar, or honey  . 2 tablespoons light pancake syrup  . 1 inch square of frosted cake or 2 inch square of unfrosted cake  . 2 small cookies (2/3 ounce each) or  large cookie  Sometimes you'll have to estimate carbohydrate amounts if you don't know the exact recipe. One cup of mixed foods like soups can have 1 to 2 carbohydrate servings, while some casseroles  might have 2 or more servings of carbohydrate. Foods that have less than 20 calories in each serving can be counted as "free" foods. Count 1 cup raw vegetables, or  cup cooked non-starchy vegetables as "free" foods. If you eat 3 or more servings at one meal, then count them as 1 carbohydrate serving.  Foods without Carbohydrates  Not all foods contain carbohydrates. Meat, some dairy, fats, non-starchy vegetables, and many beverages don't contain carbohydrate. So when you count carbohydrates, you can generally exclude chicken, pork, beef, fish, seafood, eggs, tofu, cheese, butter, sour cream, avocado, nuts, seeds, olives, mayonnaise, water, black coffee, unsweetened tea, and zero-calorie  drinks. Vegetables with no or low carbohydrate include green beans, cauliflower, tomatoes, and onions. How much carbohydrate should I eat at each meal?  Carbohydrate counting can help you plan your meals and manage your weight. Following are some starting points for carbohydrate intake at each meal. Work with your registered dietitian nutritionist to find the best range that works for your blood glucose and weight.   To Lose Weight To Maintain Weight  Women 2 - 3 carb servings 3 - 4 carb servings  Men 3 - 4 carb servings 4 - 5 carb servings  Checking your blood glucose after meals will help you know if you need to adjust the timing, type, or number of carbohydrate servings in your meal plan. Achieve and keep a healthy body weight by balancing your food intake and physical activity.  Tips How should I plan my meals?  Plan for half the food on your plate to include non-starchy vegetables, like salad greens, broccoli, or carrots. Try to eat 3 to 5 servings of non-starchy vegetables every day. Have a protein food at each meal. Protein foods include chicken, fish, meat, eggs, or beans (note that beans contain carbohydrate). These two food groups (non-starchy vegetables and proteins) are low in carbohydrate. If you fill up your plate with these foods, you will eat less carbohydrate but still fill up your stomach. Try to limit your carbohydrate portion to  of the plate.   Tips for Choosing Heart-Healthy Fats Choose lean protein and low-fat dairy foods to reduce saturated fat intake.  Saturated fat is usually found in animal-based protein and is associated with certain health risks. Saturated fat is the biggest contributor to raise low-density lipoprotein (LDL) cholesterol levels. Research shows that limiting saturated fat lowers unhealthy cholesterol levels. Eat no more than 7% of your total calories each day from saturated fat. Ask your RDN to help you determine how much saturated fat is right for  you.  There are many foods that do not contain large amounts of saturated fats. Swapping these foods to replace foods high in saturated fats will help you limit the saturated fat you eat and improve your cholesterol levels. You can also try eating more plant-based or vegetarian meals.  Instead of. Try:  Whole milk, cheese, yogurt, and ice cream 1% or skim milk, low-fat cheese, non-fat yogurt, and low-fat ice cream  Fatty, marbled beef and pork Lean beef, pork, or venison  Poultry with skin Poultry without skin  Butter, stick margarine Reduced-fat, whipped, or liquid spreads  Coconut oil, palm oil Liquid vegetable oils: corn, canola, olive, soybean and safflower oils   Avoid foods that contain trans fats. Trans fats increase levels of LDL-cholesterol. Hydrogenated fat in processed foods is the main source of trans fats in foods.  Trans fats can be found in stick margarine, shortening, processed sweets, baked goods, some fried  foods, and packaged foods made with hydrogenated oils. Avoid foods with "partially hydrogenated oil" on the ingredient list such as: cookies, pastries, baked goods, biscuits, crackers, microwave popcorn, and frozen dinners.   What should I drink?  Choose drinks that are not sweetened with sugar. The healthiest choices are water, carbonated or seltzer waters, and tea and coffee without added sugars.  Sweet drinks will make your blood glucose go up very quickly. One serving of soda or energy drink is  cup. It is best to drink these beverages only if your blood glucose is low.  Artificially sweetened, or diet drinks, typically do not increase your blood glucose if they have zero calories in them. Read labels of beverages, as some diet drinks do have carbohydrate and will raise your blood glucose. Label Reading Tips Read Nutrition Facts labels to find out how many grams of carbohydrate are in a food you want to eat. Don't forget: sometimes serving sizes on the label aren't  the same as how much food you are going to eat, so you may need to calculate how much carbohydrate is in the food you are serving yourself.   Carbohydrate Counting for People with Diabetes Sample 1-Day Menu  Breakfast  cup yogurt, low fat, low sugar (1 carbohydrate serving)   cup cereal, ready-to-eat, unsweetened (1 carbohydrate serving)  1 cup strawberries (1 carbohydrate serving)   cup almonds ( carbohydrate serving)  Lunch 1, 5 ounce can chunk light tuna  2 ounces cheese, low fat cheddar  6 whole wheat crackers (1 carbohydrate serving)  1 small apple (1 carbohydrate servings)   cup carrots ( carbohydrate serving)   cup snap peas  1 cup 1% milk (1 carbohydrate serving)   Evening Meal Stir fry made with: 3 ounces chicken  1 cup brown rice (3 carbohydrate servings)   cup broccoli ( carbohydrate serving)   cup green beans   cup onions  1 tablespoon olive oil  2 tablespoons teriyaki sauce ( carbohydrate serving)  Evening Snack 1 extra small banana (1 carbohydrate serving)  1 tablespoon peanut butter   Carbohydrate Counting for People with Diabetes Vegan Sample 1-Day Menu  Breakfast 1 cup cooked oatmeal (2 carbohydrate servings)   cup blueberries (1 carbohydrate serving)  2 tablespoons flaxseeds  1 cup soymilk fortified with calcium and vitamin D  1 cup coffee  Lunch 2 slices whole wheat bread (2 carbohydrate servings)   cup baked tofu   cup lettuce  2 slices tomato  2 slices avocado   cup baby carrots ( carbohydrate serving)  1 orange (1 carbohydrate serving)  1 cup soymilk fortified with calcium and vitamin D   Evening Meal Burrito made with: 1 6-inch corn tortilla (1 carbohydrate serving)  1 cup refried vegetarian beans (2 carbohydrate servings)   cup chopped tomatoes   cup lettuce   cup salsa  1/3 cup brown rice (1 carbohydrate serving)  1 tablespoon olive oil for rice   cup zucchini   Evening Snack 6 small whole grain crackers (1 carbohydrate  serving)  2 apricots ( carbohydrate serving)   cup unsalted peanuts ( carbohydrate serving)    Carbohydrate Counting for People with Diabetes Vegetarian (Lacto-Ovo) Sample 1-Day Menu  Breakfast 1 cup cooked oatmeal (2 carbohydrate servings)   cup blueberries (1 carbohydrate serving)  2 tablespoons flaxseeds  1 egg  1 cup 1% milk (1 carbohydrate serving)  1 cup coffee  Lunch 2 slices whole wheat bread (2 carbohydrate servings)  2 ounces low-fat cheese  cup lettuce  2 slices tomato  2 slices avocado   cup baby carrots ( carbohydrate serving)  1 orange (1 carbohydrate serving)  1 cup unsweetened tea  Evening Meal Burrito made with: 1 6-inch corn tortilla (1 carbohydrate serving)   cup refried vegetarian beans (1 carbohydrate serving)   cup tomatoes   cup lettuce   cup salsa  1/3 cup brown rice (1 carbohydrate serving)  1 tablespoon olive oil for rice   cup zucchini  1 cup 1% milk (1 carbohydrate serving)  Evening Snack 6 small whole grain crackers (1 carbohydrate serving)  2 apricots ( carbohydrate serving)   cup unsalted peanuts ( carbohydrate serving)    Copyright 2020  [employer]. All rights reserved.  Using Nutrition Labels: Carbohydrate  . Serving Size  . Look at the serving size. All the information on the label is based on this portion. Jolyne Loa Per Container  . The number of servings contained in the package. . Guidelines for Carbohydrate  . Look at the total grams of carbohydrate in the serving size.  . 1 carbohydrate choice = 15 grams of carbohydrate. Range of Carbohydrate Grams Per Choice  Carbohydrate Grams/Choice Carbohydrate Choices  6-10   11-20 1  21-25 1  26-35 2  36-40 2  41-50 3  51-55 3  56-65 4  66-70 4  71-80 5    Copyright 2020  [employer]. All rights reserved.

## 2020-05-22 NOTE — Discharge Summary (Signed)
Discharge Summary    Patient ID: Sarah Kane,  MRN: [id number], DOB/AGE: Dec 10, 1970 49 y.o.  Admit date: 05/20/2020 Discharge date: 05/22/2020  Primary Care Provider: Sunnie Nielsen Primary Cardiologist: Dietrich Pates, MD  Discharge Diagnoses    Principal Problem:   NSTEMI (non-ST elevated myocardial infarction) [facility]: Lambert Rhonda W) Active Problems:   Essential hypertension   Type 2 diabetes mellitus with hyperglycemia, without long-term current use of insulin (HCC)   Hyperlipidemia LDL goal <70   CAD (coronary artery disease)   Ischemic cardiomyopathy   Allergies No Known Allergies  Diagnostic Studies/Procedures    Left heart catheterization 05/20/20:  Prox RCA lesion is 30% stenosed.  Mid RCA lesion is 100% stenosed.  Prox Cx to Mid Cx lesion is 70% stenosed.  Dist LAD lesion is 99% stenosed.  Prox LAD to Dist LAD lesion is 30% stenosed.  A drug-eluting stent was successfully placed using a SYNERGY XD 2.75X20.  Post intervention, there is a 0% residual stenosis.  A drug-eluting stent was successfully placed using a SYNERGY XD 2.25X12.  Dist RCA-1 lesion is 70% stenosed.  Post intervention, there is a 0% residual stenosis.  Dist RCA-2 lesion is 70% stenosed with 70% stenosed side branch in RPAV.   1. Acute inferior MT/NSTEMI secondary to occlusion of the mid RCA. This is a dominant vessel.  2. Successful PTCA/DES x 2 mid and distal RCA 3. The LAD is a moderate caliber vessel that courses to the apex. The distal vessel is diabetic appearing, small in caliber and diffusely disease. Not a target for PCI.  4. 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   Recommendations: Pt pain free post PCI. Will continue Aggrastat for 2 hours post PCI. Continue DAPT with ASA and Brilinta for one year. Echo in am. Continue high intensity statin. Staged PCI of the Circumflex possibly tomorrow. Her distal vessel are diffusely diseased with diabetic appearance and are not targets for PCI.     Echocardiogram 05/21/20: 1. Left ventricular ejection fraction, by estimation, is 45 to 50%. The  left ventricle has mildly decreased function. The left ventricle  demonstrates regional wall motion abnormalities (see scoring  diagram/findings for description). Left ventricular  diastolic parameters are consistent with Grade I diastolic dysfunction  (impaired relaxation).  2. Right ventricular systolic function is normal. The right ventricular  size is normal.  3. The mitral valve is normal in structure. No evidence of mitral valve  regurgitation. No evidence of mitral stenosis.  4. The aortic valve is normal in structure. Aortic valve regurgitation is  not visualized. No aortic stenosis is present.  5. The inferior vena cava is normal in size with greater than 50%  respiratory variability, suggesting right atrial pressure of 3 mmHg.  _____________   History of Present Illness     Sarah Kane is a 49 y.o. female with a PMH of HTN and DM, who presented with chest pain and was found to have elevated troponins. She was in her usual state of health until 05/19/20 when she began experiencing weakness, fatigue, and intermittent substernal chest pressure with associated SOB while working the third shift at [employer]. She reported SBP elevated to 160. She was able to complete her shift, however upon returning home, her chest pressure worsened with ongoing SOB and radiation of pain to the left shoulder with associated diaphoresis. She initially presented to [facility] where she was found to have elevated HsTrop to 3600s. Decision made to transfer patient to [facility] for further care.  She  has no prior history of chest pain. Denies tobacco smoking or alcohol abuse. She reports maternal uncle with CAD.   Hospital Course     Consultants: None   1. NSTEMI: patient presented with intermittent chest pain, found to have elevated HsTrop - peaked at 13,919 and trended down. She underwent a LHC 05/20/20 and  was found to have diffuse CAD with 100% mRCA stenosis managed with PCI/DES, as well as 30% pRCA stenosis, 70% p-mLCx stenosis, 30% p-dLAD stenosis, 99% dLAD stenosis (too small to intervene), and 70% dRCA-2/RPAV stenosis. Given relief of pain with PCI to RCA, management of LCx stenosis was not pursued. Consideration could be made for staged PCI to LCx for refractory angina. She was started on aspirin and brilinta for DAPT with recommendations to continue uninterrupted x1 year. Case management assisted with medication needs - unfortunately Brilinta will be cost prohibitive following the free 30-day supply and will likely need to be transitioned to plavix in the future.  - Continue aspirin and brilinta for now. Anticipate transition to plavix following 1 month of brilinta - Continue statin - Continue metoprolol succinate  2. Ischemic cardiomyopathy: echo this admission showed EF 45-50% with G1DD, RWMA noted, and no significant valvular abnormalities. She appeared euvolemic on exam throughout admission. - Continue metoprolol succinate - Anticipate repeat echo in 3 months to evaluate for improvement in EF  3. HTN: BP stable/soft this admission. Home losartan held.  - Continue metoprolol succinate 25mg  BID - Could consider restarting losartan outpatient if BP uptrends  4. HLD: LDL 102 this admission; goal <70. Started on high-intensity statin this admission - Continue atorvastatin - Will need repeat FLP/LFTs in 6-8 weeks   5. DM type 2: A1C 8.0 this admission; goal <7 - Continue home regimen - Encouraged carb modified diet - Consider addition of SGLT-2 inhibitor outpatient given CAD and ischemic cardiomyopathy with A1C above goal. _____________  Discharge Vitals Blood pressure 105/80, pulse 79, temperature 98.1 F (36.7 C), temperature source Oral, resp. rate 12, height 5' (1.524 m), weight 79.3 kg, SpO2 95 %.  Filed Weights   05/20/20 1116 05/21/20 0401 05/22/20 0558  Weight: 81.6 kg 79.3 kg  79.3 kg    Labs & Radiologic Studies    CBC Recent Labs    05/20/20 1126 05/21/20 0438  WBC 11.1* 9.9  HGB 14.8 13.7  HCT 43.9 42.2  MCV 85.4 89.0  PLT 246 178   Basic Metabolic Panel Recent Labs    05/23/20 1126 05/21/20 0438  NA 137 137  K 4.1 4.4  CL 104 105  CO2 23 24  GLUCOSE 216* 171*  BUN 11 8  CREATININE 0.72 0.84  CALCIUM 8.9 8.9   Liver Function Tests No results for input(s): AST, ALT, ALKPHOS, BILITOT, PROT, ALBUMIN in the last 72 hours. No results for input(s): LIPASE, AMYLASE in the last 72 hours. Cardiac Enzymes No results for input(s): CKTOTAL, CKMB, CKMBINDEX, TROPONINI in the last 72 hours. BNP Invalid input(s): POCBNP D-Dimer No results for input(s): DDIMER in the last 72 hours. Hemoglobin A1C Recent Labs    05/20/20 2106  HGBA1C 8.0*   Fasting Lipid Panel Recent Labs    05/21/20 0438  CHOL 174  HDL 42  LDLCALC 102*  TRIG 148  CHOLHDL 4.1   Thyroid Function Tests Recent Labs    05/20/20 2106  TSH 1.494   _____________  DG Chest 2 View  Result Date: 05/20/2020 CLINICAL DATA:  Chest pain. EXAM: CHEST - 2 VIEW COMPARISON:  June 02, 2018. FINDINGS: The heart size and mediastinal contours are within normal limits. Both lungs are clear. No pneumothorax or pleural effusion is noted. The visualized skeletal structures are unremarkable. IMPRESSION: No active cardiopulmonary disease. Electronically Signed   By: Lupita Raider M.D.   On: 05/20/2020 11:55   CARDIAC CATHETERIZATION  Result Date: 05/20/2020  Prox RCA lesion is 30% stenosed.  Mid RCA lesion is 100% stenosed.  Prox Cx to Mid Cx lesion is 70% stenosed.  Dist LAD lesion is 99% stenosed.  Prox LAD to Dist LAD lesion is 30% stenosed.  A drug-eluting stent was successfully placed using a SYNERGY XD 2.75X20.  Post intervention, there is a 0% residual stenosis.  A drug-eluting stent was successfully placed using a SYNERGY XD 2.25X12.  Dist RCA-1 lesion is 70% stenosed.   Post intervention, there is a 0% residual stenosis.  Dist RCA-2 lesion is 70% stenosed with 70% stenosed side branch in RPAV.  1. Acute inferior MT/NSTEMI secondary to occlusion of the mid RCA. This is a dominant vessel. 2. Successful PTCA/DES x 2 mid and distal RCA 3. The LAD is a moderate caliber vessel that courses to the apex. The distal vessel is diabetic appearing, small in caliber and diffusely disease. Not a target for PCI. 4. 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 Recommendations: Pt pain free post PCI. Will continue Aggrastat for 2 hours post PCI. Continue DAPT with ASA and Brilinta for one year. Echo in am. Continue high intensity statin. Staged PCI of the Circumflex possibly tomorrow. Her distal vessel are diffusely diseased with diabetic appearance and are not targets for PCI.   ECHOCARDIOGRAM COMPLETE  Result Date: 05/21/2020    ECHOCARDIOGRAM REPORT   Patient Name:   Sarah Kane Date of Exam: 05/21/2020 Medical Rec #:  [id number]    Height:       60.0 in Accession #:    [id number]   Weight:       174.9 lb Date of Birth:  1971-04-05     BSA:          1.763 m Patient Age:    49 years     BP:           102/66 mmHg Patient Gender: F            HR:           86 bpm. Exam Location:  Inpatient Procedure: 2D Echo, Cardiac Doppler and Color Doppler Indications:    R07.9* Chest pain, unspecified  History:        Patient has no prior history of Echocardiogram examinations.                 Previous Myocardial Infarction; Risk Factors:Hypertension and                 Diabetes.  Sonographer:    Eulah Pont RDCS Referring Phys: [id number] Manson Passey IMPRESSIONS  1. Left ventricular ejection fraction, by estimation, is 45 to 50%. The left ventricle has mildly decreased function. The left ventricle demonstrates regional wall motion abnormalities (see scoring diagram/findings for description). Left ventricular diastolic parameters are consistent with Grade I diastolic dysfunction (impaired  relaxation).  2. Right ventricular systolic function is normal. The right ventricular size is normal.  3. The mitral valve is normal in structure. No evidence of mitral valve regurgitation. No evidence of mitral stenosis.  4. The aortic valve is normal in structure. Aortic valve regurgitation is not visualized. No aortic stenosis is present.  5. The inferior vena cava is normal in size with greater than 50% respiratory variability, suggesting right atrial pressure of 3 mmHg. FINDINGS  Left Ventricle: Left ventricular ejection fraction, by estimation, is 45 to 50%. The left ventricle has mildly decreased function. The left ventricle demonstrates regional wall motion abnormalities. The left ventricular internal cavity size was normal in size. There is no left ventricular hypertrophy. Left ventricular diastolic parameters are consistent with Grade I diastolic dysfunction (impaired relaxation).  LV Wall Scoring: The mid inferoseptal segment and mid inferior segment are akinetic. Right Ventricle: The right ventricular size is normal. No increase in right ventricular wall thickness. Right ventricular systolic function is normal. Left Atrium: Left atrial size was normal in size. Right Atrium: Right atrial size was normal in size. Pericardium: There is no evidence of pericardial effusion. Mitral Valve: The mitral valve is normal in structure. Normal mobility of the mitral valve leaflets. No evidence of mitral valve regurgitation. No evidence of mitral valve stenosis. Tricuspid Valve: The tricuspid valve is normal in structure. Tricuspid valve regurgitation is not demonstrated. No evidence of tricuspid stenosis. Aortic Valve: The aortic valve is normal in structure. Aortic valve regurgitation is not visualized. No aortic stenosis is present. Pulmonic Valve: The pulmonic valve was normal in structure. Pulmonic valve regurgitation is not visualized. No evidence of pulmonic stenosis. Aorta: The aortic root is normal in size and  structure. Venous: The inferior vena cava is normal in size with greater than 50% respiratory variability, suggesting right atrial pressure of 3 mmHg. IAS/Shunts: No atrial level shunt detected by color flow Doppler.  LEFT VENTRICLE PLAX 2D LVIDd:         4.75 cm      Diastology LVIDs:         3.63 cm      LV e' lateral:   11.60 cm/s LV PW:         0.66 cm      LV E/e' lateral: 6.7 LV IVS:        0.67 cm      LV e' medial:    6.64 cm/s LVOT diam:     1.80 cm      LV E/e' medial:  11.8 LV SV:         46 LV SV Index:   26 LVOT Area:     2.54 cm  LV Volumes (MOD) LV vol d, MOD A4C: 100.0 ml LV vol s, MOD A4C: 45.8 ml LV SV MOD A4C:     100.0 ml RIGHT VENTRICLE RV S prime:     12.00 cm/s TAPSE (M-mode): 1.9 cm LEFT ATRIUM             Index       RIGHT ATRIUM           Index LA diam:        3.10 cm 1.76 cm/m  RA Area:     11.70 cm LA Vol (A2C):   30.2 ml 17.13 ml/m RA Volume:   27.00 ml  15.31 ml/m LA Vol (A4C):   32.1 ml 18.21 ml/m LA Biplane Vol: 31.4 ml 17.81 ml/m  AORTIC VALVE LVOT Vmax:   87.20 cm/s LVOT Vmean:  66.000 cm/s LVOT VTI:    0.180 m  AORTA Ao Root diam: 2.40 cm Ao Asc diam:  2.50 cm MITRAL VALVE MV Area (PHT): 5.13 cm    SHUNTS MV Decel Time: 148 msec    Systemic VTI:  0.18 m MV E velocity: 78.10  cm/s  Systemic Diam: 1.80 cm MV A velocity: 76.20 cm/s MV E/A ratio:  1.02 Donato Schultz MD Electronically signed by Donato Schultz MD Signature Date/Time: 05/21/2020/12:09:58 PM    Final    Disposition   Patient was seen and examined by Dr. Flora Lipps who deemed patient as stable for discharge. Follow-up has been arranged. Discharge medications as listed below.   Follow-up Plans & Appointments     Follow-up Information    [place], [place], [place] Follow up.   Specialty: Cardiology Why: Please arrive 15 minutes early for your 10:45am post-hospital cardiology appointment. You will see Chelsea Aus, a physician assistant who works closely with Dr. Tenny Craw.  Contact information: [address] [place] [address] [phone]              Discharge Instructions    Amb Referral to Cardiac Rehabilitation   Complete by: As directed    Referring to [place] [facility] 2   Diagnosis:  Coronary Stents NSTEMI     After initial evaluation and assessments completed: Virtual Based Care may be provided alone or in conjunction with Phase 2 Cardiac Rehab based on patient barriers.: Yes      Discharge Medications   Allergies as of 05/22/2020   No Known Allergies     Medication List    STOP taking these medications   losartan 25 MG tablet Commonly known as: COZAAR     TAKE these medications   acetaminophen 325 MG tablet Commonly known as: TYLENOL Take 2 tablets (650 mg total) by mouth every 4 (four) hours as needed for headache or mild pain.   alendronate 70 MG tablet Commonly known as: FOSAMAX Take 1 tablet (70 mg total) by mouth once a week. Take with a full glass of water on an empty stomach. What changed: when to take this   aspirin 81 MG EC tablet Take 1 tablet (81 mg total) by mouth daily. Swallow whole.   atorvastatin 80 MG tablet Commonly known as: LIPITOR Take 1 tablet (80 mg total) by mouth daily.   buPROPion 150 MG 24 hr tablet Commonly known as: Wellbutrin XL Take 1 tablet (150 mg total) by mouth every morning.   gabapentin 300 MG capsule Commonly known as: NEURONTIN TAKE 1 CAPSULE BY MOUTH THREE TIMES DAILY   glimepiride 4 MG tablet Commonly known as: AMARYL TAKE 2 TABLETS BY MOUTH ONCE DAILY WITH BREAKFAST What changed: See the new instructions.   metoprolol succinate 25 MG 24 hr tablet Commonly known as: TOPROL-XL Take 1 tablet (25 mg total) by mouth 2 (two) times daily with a meal. What changed: when to take this   nitroGLYCERIN 0.4 MG SL tablet Commonly known as: NITROSTAT Place 1 tablet (0.4 mg total) under the tongue every 5 (five) minutes x 3 doses as needed for chest pain. What changed: when to take this   Ozempic (1 MG/DOSE) 2  MG/1.5ML Sopn Generic drug: Semaglutide (1 MG/DOSE) Inject 1 mg into the skin once a week. What changed: Another medication with the same name was removed. Continue taking this medication, and follow the directions you see here.   sitaGLIPtin 100 MG tablet Commonly known as: JANUVIA Take 1 tablet (100 mg total) by mouth daily.   ticagrelor 90 MG Tabs tablet Commonly known as: BRILINTA Take 1 tablet (90 mg total) by mouth 2 (two) times daily.   valACYclovir 1000 MG tablet Commonly known as: VALTREX Take 1 tablet (1,000 mg total) by mouth 3 (three) times daily.  Vitamin D (Ergocalciferol) 1.25 MG (50000 UNIT) Caps capsule Commonly known as: DRISDOL Take 1 capsule by mouth once a week What changed: when to take this        Aspirin prescribed at discharge?  Yes High Intensity Statin Prescribed? (Lipitor 40-80mg  or Crestor 20-40mg ): Yes Beta Blocker Prescribed? Yes For EF <40%, was ACEI/ARB Prescribed? No: N/A ADP Receptor Inhibitor Prescribed? (i.e. Plavix etc.-Includes Medically Managed Patients): Yes For EF <40%, Aldosterone Inhibitor Prescribed? No: N/A Was EF assessed during THIS hospitalization? Yes Was Cardiac Rehab II ordered? (Included Medically managed Patients): Yes   Outstanding Labs/Studies   FLP/LFTs in 6-8 weeks   Echocardiogram in 3 months  Duration of Discharge Encounter   Greater than 30 minutes including physician time.  Signed, Beatriz Stallion PA-C 05/22/2020, 8:02 AM

## 2020-05-24 NOTE — Telephone Encounter (Signed)
**Note De-Identified Desirey Keahey Obfuscation** 1st attempted TCM Call: A lady answered the phone but did not identify herself except to say that she is not the pt or the pts daughter Dahlia Client ([place]). I left a message with the lady asking the pt to call Larita Fife at [facility] at [phone] and that if she calls back after 3 today to ask to s/w a triage nurse.  She states that she will relay the message to the pt.

## 2020-05-25 NOTE — Telephone Encounter (Signed)
**Note De-Identified Sarah Kane Obfuscation** Patient contacted regarding discharge from [facility] on 05/22/2020.  Patient understands to follow up with provider Chelsea Aus on 06/03/2020 at 10:45 at [address], [address] in [place], [place] [address]. Patient understands discharge instructions? Yes Patient understands medications and regiment? Yes Patient understands to bring all medications to this visit? Yes   Ask patient:  Are you enrolled in My Chart: No, I offered to assist her but she states that she is bringing her cell phone with her to this f/u so someone can help her sign up.

## 2020-05-26 DIAGNOSIS — U071 COVID-19: Secondary | ICD-10-CM

## 2020-05-26 HISTORY — DX: COVID-19: U07.1

## 2020-05-28 ENCOUNTER — Telehealth (HOSPITAL_COMMUNITY): Payer: Self-pay

## 2020-05-28 NOTE — Telephone Encounter (Signed)
Faxed referral for Phase II Cardiac Rehab to [place]. °

## 2020-06-03 ENCOUNTER — Ambulatory Visit (INDEPENDENT_AMBULATORY_CARE_PROVIDER_SITE_OTHER): Payer: BC Managed Care – PPO | Admitting: Physician Assistant

## 2020-06-03 ENCOUNTER — Encounter: Payer: Self-pay | Admitting: Physician Assistant

## 2020-06-03 ENCOUNTER — Other Ambulatory Visit: Payer: Self-pay

## 2020-06-03 ENCOUNTER — Encounter: Payer: Self-pay | Admitting: *Deleted

## 2020-06-03 VITALS — BP 120/86 | HR 82 | Ht 60.0 in | Wt 172.4 lb

## 2020-06-03 DIAGNOSIS — T148XXA Other injury of unspecified body region, initial encounter: Secondary | ICD-10-CM

## 2020-06-03 DIAGNOSIS — I10 Essential (primary) hypertension: Secondary | ICD-10-CM | POA: Diagnosis not present

## 2020-06-03 DIAGNOSIS — I25110 Atherosclerotic heart disease of native coronary artery with unstable angina pectoris: Secondary | ICD-10-CM | POA: Diagnosis not present

## 2020-06-03 DIAGNOSIS — E1165 Type 2 diabetes mellitus with hyperglycemia: Secondary | ICD-10-CM | POA: Diagnosis not present

## 2020-06-03 DIAGNOSIS — I255 Ischemic cardiomyopathy: Secondary | ICD-10-CM | POA: Diagnosis not present

## 2020-06-03 DIAGNOSIS — E785 Hyperlipidemia, unspecified: Secondary | ICD-10-CM

## 2020-06-03 MED ORDER — ISOSORBIDE MONONITRATE ER 30 MG PO TB24: 30.0000 mg | ORAL_TABLET | Freq: Every day | ORAL | 3 refills | Status: DC

## 2020-06-03 NOTE — Patient Instructions (Addendum)
Medication Instructions:  Your physician has recommended you make the following change in your medication:  1.  START Imdur 30 mg taking 1 daily   *If you need a refill on your cardiac medications before your next appointment, please call your pharmacy*   Lab Work: TODAY:  CBC  If you have labs (blood work) drawn today and your tests are completely normal, you will receive your results only by:  MyChart Message (if you have MyChart) OR  A paper copy in the mail If you have any lab test that is abnormal or we need to change your treatment, we will call you to review the results.   Testing/Procedures: None ordered   Follow-Up: At [facility], you and your health needs are our priority.  As part of our continuing mission to provide you with exceptional heart care, we have created designated Provider Care Teams.  These Care Teams include your primary Cardiologist (physician) and Advanced Practice Providers (APPs -  Physician Assistants and Nurse Practitioners) who all work together to provide you with the care you need, when you need it.  We recommend signing up for the patient portal called "MyChart".  Sign up information is provided on this After Visit Summary.  MyChart is used to connect with patients for Virtual Visits (Telemedicine).  Patients are able to view lab/test results, encounter notes, upcoming appointments, etc.  Non-urgent messages can be sent to your provider as well.   To learn more about what you can do with MyChart, go to [email or web address].    Your next appointment:   06/18/20  ARRIVE AT 11:25 FOR REGISTRATION  The format for your next appointment:   In Person  Provider:    You may see Dietrich Pates, MD    Other Instructions

## 2020-06-03 NOTE — Progress Notes (Signed)
Cardiology Office Note    Date:  06/03/2020   ID:  Sarah Kane, DOB 23-May-1971, MRN [id number]  PCP:  Sunnie Nielsen, DO  Cardiologist:  Dr. Tenny Craw   Chief Complaint: Hospital follow up  History of Present Illness:   Sarah Kane is a 48 y.o. female with a PMH of HTN and DM who admitted 04/2020 with NSTEMI. She underwent a LHC 05/20/20 and was found to have diffuse CAD with 100% mRCA stenosis managed with PCI/DES, as well as 30% pRCA stenosis, 70% p-mLCx stenosis, 30% p-dLAD stenosis, 99% dLAD stenosis (too small to intervene), and 70% dRCA-2/RPAV stenosis. Given relief of pain with PCI to RCA, management of LCx stenosis was not pursued. Consideration could be made for staged PCI to LCx for refractory angina. Brillinta could be changed to Plavix if cost is issue. Echo with EF of 45-50% with G1DD. Home Losartan held due to soft BP. Continued Toprol.   Patient is here for follow-up with her sister.  Walking 15 to 20 minutes at a time with mild intermittent chest discomfort.  Not as intense as like prior angina but similar.  Denies shortness of breath, orthopnea, PND, syncope, lower extremity edema or melena.  She has bruising which is improving.  Reports right wrist pain.  Previously on anxiety medication but not taking currently.  Dealing with stress and anxiety.  Patient is worried about Brilinta cost.   Past Medical History:  Diagnosis Date   Arthritis    Diabetes mellitus without complication (HCC)    Hypertension     Past Surgical History:  Procedure Laterality Date   CESAREAN SECTION  1990   CORONARY STENT INTERVENTION N/A 05/20/2020   Procedure: CORONARY STENT INTERVENTION;  Surgeon: Kathleene Hazel, MD;  Location: [facility];  Service: Cardiovascular;  Laterality: N/A;   LEFT HEART CATH AND CORONARY ANGIOGRAPHY N/A 05/20/2020   Procedure: LEFT HEART CATH AND CORONARY ANGIOGRAPHY;  Surgeon: Kathleene Hazel, MD;  Location: [facility];  Service:  Cardiovascular;  Laterality: N/A;   TRIGGER FINGER RELEASE Right    thumb    Current Medications: Prior to Admission medications   Medication Sig Start Date End Date Taking? Authorizing Provider  acetaminophen (TYLENOL) 325 MG tablet Take 2 tablets (650 mg total) by mouth every 4 (four) hours as needed for headache or mild pain. 05/21/20   Leone Brand, NP  alendronate (FOSAMAX) 70 MG tablet Take 1 tablet (70 mg total) by mouth once a week. Take with a full glass of water on an empty stomach. Patient taking differently: Take 70 mg by mouth every Sunday. Take with a full glass of water on an empty stomach. 12/08/19   Sunnie Nielsen, DO  aspirin EC 81 MG EC tablet Take 1 tablet (81 mg total) by mouth daily. Swallow whole. 05/22/20   Leone Brand, NP  atorvastatin (LIPITOR) 80 MG tablet Take 1 tablet (80 mg total) by mouth daily. 05/22/20   Leone Brand, NP  buPROPion (WELLBUTRIN XL) 150 MG 24 hr tablet Take 1 tablet (150 mg total) by mouth every morning. Patient not taking: Reported on 05/21/2020 01/13/20   Sunnie Nielsen, DO  gabapentin (NEURONTIN) 300 MG capsule TAKE 1 CAPSULE BY MOUTH THREE TIMES DAILY Patient taking differently: Take 300 mg by mouth 3 (three) times daily.  04/08/20   Sunnie Nielsen, DO  glimepiride (AMARYL) 4 MG tablet TAKE 2 TABLETS BY MOUTH ONCE DAILY WITH BREAKFAST Patient taking differently: Take 8 mg  by mouth daily with breakfast.  05/04/20   Sunnie Nielsen, DO  metoprolol succinate (TOPROL-XL) 25 MG 24 hr tablet Take 1 tablet (25 mg total) by mouth 2 (two) times daily with a meal. 05/21/20   Leone Brand, NP  nitroGLYCERIN (NITROSTAT) 0.4 MG SL tablet Place 1 tablet (0.4 mg total) under the tongue every 5 (five) minutes x 3 doses as needed for chest pain. 05/21/20   Leone Brand, NP  Semaglutide, 1 MG/DOSE, (OZEMPIC, 1 MG/DOSE,) 2 MG/1.5ML SOPN Inject 1 mg into the skin once a week. Patient not taking: Reported on 05/21/2020 07/23/19   Sunnie Nielsen, DO  sitaGLIPtin (JANUVIA) 100 MG tablet Take 1 tablet (100 mg total) by mouth daily. 01/13/20   Sunnie Nielsen, DO  ticagrelor (BRILINTA) 90 MG TABS tablet Take 1 tablet (90 mg total) by mouth 2 (two) times daily. 05/21/20   Leone Brand, NP  valACYclovir (VALTREX) 1000 MG tablet Take 1 tablet (1,000 mg total) by mouth 3 (three) times daily. Patient not taking: Reported on 05/21/2020 10/23/19   Sunnie Nielsen, DO  Vitamin D, Ergocalciferol, (DRISDOL) 1.25 MG (50000 UNIT) CAPS capsule Take 1 capsule by mouth once a week Patient taking differently: Take 50,000 Units by mouth every Sunday.  04/08/20   Sunnie Nielsen, DO    Allergies:   Patient has no known allergies.   Social History   Socioeconomic History   Marital status: Divorced    Spouse name: Not on file   Number of children: 3   Years of education: Not on file   Highest education level: Not on file  Occupational History    Employer: [employer]  Tobacco Use   Smoking status: Never Smoker   Smokeless tobacco: Never Used  Vaping Use   Vaping Use: Never used  Substance and Sexual Activity   Alcohol use: No   Drug use: No   Sexual activity: Not Currently    Partners: Male  Other Topics Concern   Not on file  Social History Narrative   Not on file   Social Determinants of Health   Financial Resource Strain:    Difficulty of Paying Living Expenses: Not on file  Food Insecurity:    Worried About Running Out of Food in the Last Year: Not on file   [employer] of Food in the Last Year: Not on file  Transportation Needs:    Lack of Transportation (Medical): Not on file   Lack of Transportation (Non-Medical): Not on file  Physical Activity:    Days of Exercise per Week: Not on file   Minutes of Exercise per Session: Not on file  Stress:    Feeling of Stress : Not on file  Social Connections:    Frequency of Communication with Friends and Family: Not on file    Frequency of Social Gatherings with Friends and Family: Not on file   Attends Religious Services: Not on file   Active Member of Clubs or Organizations: Not on file   Attends Banker Meetings: Not on file   Marital Status: Not on file     Family History:  The patient's family history includes Diabetes in her maternal aunt, maternal grandfather, maternal grandmother, maternal uncle, and mother; Heart attack in her maternal grandfather; High blood pressure in her maternal grandfather, maternal grandmother, mother, paternal grandfather, and paternal grandmother; Skin cancer in her maternal uncle.  ROS:   Please see the history of present illness.  ROS All other systems reviewed and are negative.   PHYSICAL EXAM:   VS:  BP 120/86    Pulse 82    Ht 5' (1.524 m)    Wt 172 lb 6.4 oz (78.2 kg)    SpO2 96%    BMI 33.67 kg/m    GEN: Well nourished, well developed, in no acute distress  HEENT: normal  Neck: no JVD, carotid bruits, or masses Cardiac: RRR; no murmurs, rubs, or gallops,no edema right forearm ecchymosis Respiratory:  clear to auscultation bilaterally, normal work of breathing GI: soft, nontender, nondistended, + BS MS: no deformity or atrophy  Skin: warm and dry, no rash Neuro:  Alert and Oriented x 3, Strength and sensation are intact Psych: euthymic mood, full affect  Wt Readings from Last 3 Encounters:  06/03/20 172 lb 6.4 oz (78.2 kg)  05/22/20 174 lb 12.8 oz (79.3 kg)  01/13/20 178 lb (80.7 kg)      Studies/Labs Reviewed:   EKG:  EKG is ordered today.  The ekg ordered today demonstrates normal sinus rhythm at rate of 82 bpm  Recent Labs: 08/20/2019: ALT 44 05/20/2020: TSH 1.494 05/21/2020: BUN 8; Creatinine, Ser 0.84; Hemoglobin 13.7; Platelets 178; Potassium 4.4; Sodium 137   Lipid Panel    Component Value Date/Time   CHOL 174 05/21/2020 0438   TRIG 148 05/21/2020 0438   HDL 42 05/21/2020 0438   CHOLHDL 4.1 05/21/2020 0438   VLDL 30  05/21/2020 0438   LDLCALC 102 (H) 05/21/2020 0438    Additional studies/ records that were reviewed today include:   Left heart catheterization 05/20/20:  Prox RCA lesion is 30% stenosed.  Mid RCA lesion is 100% stenosed.  Prox Cx to Mid Cx lesion is 70% stenosed.  Dist LAD lesion is 99% stenosed.  Prox LAD to Dist LAD lesion is 30% stenosed.  A drug-eluting stent was successfully placed using a SYNERGY XD 2.75X20.  Post intervention, there is a 0% residual stenosis.  A drug-eluting stent was successfully placed using a SYNERGY XD 2.25X12.  Dist RCA-1 lesion is 70% stenosed.  Post intervention, there is a 0% residual stenosis.  Dist RCA-2 lesion is 70% stenosed with 70% stenosed side branch in RPAV.  1. Acute inferior MT/NSTEMI secondary to occlusion of the mid RCA. This is a dominant vessel.  2. Successful PTCA/DES x 2 mid and distal RCA 3. The LAD is a moderate caliber vessel that courses to the apex. The distal vessel is diabetic appearing, small in caliber and diffusely disease. Not a target for PCI.  4. 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   Recommendations: Pt pain free post PCI. Will continue Aggrastat for 2 hours post PCI. Continue DAPT with ASA and Brilinta for one year. Echo in am. Continue high intensity statin. Staged PCI of the Circumflex possibly tomorrow. Her distal vessel are diffusely diseased with diabetic appearance and are not targets for PCI.  Diagnostic Dominance: Right  Intervention     Echocardiogram 05/21/20: 1. Left ventricular ejection fraction, by estimation, is 45 to 50%. The  left ventricle has mildly decreased function. The left ventricle  demonstrates regional wall motion abnormalities (see scoring  diagram/findings for description). Left ventricular  diastolic parameters are consistent with Grade I diastolic dysfunction  (impaired relaxation).  2. Right ventricular systolic function is normal. The right ventricular    size is normal.  3. The mitral valve is normal in structure. No evidence of mitral valve  regurgitation. No evidence of mitral stenosis.  4. The aortic valve is normal in structure. Aortic valve regurgitation is  not visualized. No aortic stenosis is present.  5. The inferior vena cava is normal in size with greater than 50%  respiratory variability, suggesting right atrial pressure of 3 mmHg.  _____________   ASSESSMENT & PLAN:    1. CAD s/p DES to [facility] - medical management of other disease however could consider staged PCI to LCx for refractory angina. -She has mild discomfort with activity but not as bad as it was this before.  Improving.  Questionable some symptoms related to anxiety/stress. -Add Imdur to see response.  Excused from work note given until office visit in 2 weeks. -Continue aspirin, Brilinta, beta-blocker and statin. -She will apply from [employer] and check with insurance company.  She will let us know if cannot afford her medication.  2. ICM - LVEF of 45-50% with grade 1 DD -Euvolemic -Continue beta-blocker -May consider adding back losartan at follow-up  3. HTN -Blood pressure stable on beta-blocker  4. HLD - 05/21/2020: Cholesterol 174; HDL 42; LDL Cholesterol 102; Triglycerides 148; VLDL 30  - Continue Lipitor - Repeat labs in 6 weeks  5. DM - A1c 8.0  6.  Anxiety -Advised to discuss with PCP  7.  Bruising at right forearm -No hematoma noted -Take Tylenol -Check CBC    Medication Adjustments/Labs and Tests Ordered: Current medicines are reviewed at length with the patient today.  Concerns regarding medicines are outlined above.  Medication changes, Labs and Tests ordered today are listed in the Patient Instructions below. Patient Instructions  Medication Instructions:  Your physician has recommended you make the following change in your medication:  1.  START Imdur 30 mg taking 1 daily   *If you need a refill on your  cardiac medications before your next appointment, please call your pharmacy*   Lab Work: TODAY:  CBC  If you have labs (blood work) drawn today and your tests are completely normal, you will receive your results only by:  MyChart Message (if you have MyChart) OR  A paper copy in the mail If you have any lab test that is abnormal or we need to change your treatment, we will call you to review the results.   Testing/Procedures: None ordered   Follow-Up: At [facility], you and your health needs are our priority.  As part of our continuing mission to provide you with exceptional heart care, we have created designated Provider Care Teams.  These Care Teams include your primary Cardiologist (physician) and Advanced Practice Providers (APPs -  Physician Assistants and Nurse Practitioners) who all work together to provide you with the care you need, when you need it.  We recommend signing up for the patient portal called "MyChart".  Sign up information is provided on this After Visit Summary.  MyChart is used to connect with patients for Virtual Visits (Telemedicine).  Patients are able to view lab/test results, encounter notes, upcoming appointments, etc.  Non-urgent messages can be sent to your provider as well.   To learn more about what you can do with MyChart, go to [email or web address].    Your next appointment:   06/18/20  ARRIVE AT 11:25 FOR REGISTRATION  The format for your next appointment:   In Person  Provider:    You may see Dietrich Pates, MD    Other Instructions      Signed, Manson Passey, [place]  06/03/2020 11:28 AM    [facility] [address] [place], [place]  [address] Phone: [phone]; Fax: [phone]

## 2020-06-04 ENCOUNTER — Telehealth: Payer: Self-pay | Admitting: Internal Medicine

## 2020-06-04 LAB — CBC
Hematocrit: 48.9 % — ABNORMAL HIGH (ref 34.0–46.6)
Hemoglobin: 16.8 g/dL — ABNORMAL HIGH (ref 11.1–15.9)
MCH: 29.3 pg (ref 26.6–33.0)
MCHC: 34.4 g/dL (ref 31.5–35.7)
MCV: 85 fL (ref 79–97)
Platelets: 268 10*3/uL (ref 150–450)
RBC: 5.74 x10E6/uL — ABNORMAL HIGH (ref 3.77–5.28)
RDW: 12 % (ref 11.7–15.4)
WBC: 10.3 10*3/uL (ref 3.4–10.8)

## 2020-06-04 MED ORDER — TICAGRELOR 90 MG PO TABS
90.0000 mg | ORAL_TABLET | Freq: Two times a day (BID) | ORAL | 11 refills | Status: DC
Start: 2020-06-04 — End: 2021-06-15

## 2020-06-04 MED ORDER — ATORVASTATIN CALCIUM 80 MG PO TABS
80.0000 mg | ORAL_TABLET | Freq: Every day | ORAL | 3 refills | Status: DC
Start: 2020-06-04 — End: 2021-06-13

## 2020-06-04 MED ORDER — ASPIRIN 81 MG PO TBEC
81.0000 mg | DELAYED_RELEASE_TABLET | Freq: Every day | ORAL | 11 refills | Status: DC
Start: 2020-06-04 — End: 2023-03-22

## 2020-06-04 MED ORDER — METOPROLOL SUCCINATE ER 25 MG PO TB24
25.0000 mg | ORAL_TABLET | Freq: Two times a day (BID) | ORAL | 3 refills | Status: DC
Start: 2020-06-04 — End: 2021-07-26

## 2020-06-04 MED ORDER — ISOSORBIDE MONONITRATE ER 30 MG PO TB24
30.0000 mg | ORAL_TABLET | Freq: Every day | ORAL | 3 refills | Status: DC
Start: 2020-06-04 — End: 2020-07-15

## 2020-06-04 MED ORDER — NITROGLYCERIN 0.4 MG SL SUBL: 0.4000 mg | SUBLINGUAL_TABLET | SUBLINGUAL | 6 refills | Status: DC | PRN

## 2020-06-04 NOTE — Telephone Encounter (Signed)
Called pt and stated that she needed all of her medication sent to her requested pharmacy. I resent pt's medication to pt's requested pharmacy. Confirmation received. I advised pt that if she has any other problems, questions or concerns, to give our office a call back. Pt verbalized understanding.

## 2020-06-04 NOTE — Telephone Encounter (Signed)
Pt c/o medication issue:  1. Name of Medication: ticagrelor (BRILINTA) 90 MG TABS tablet  2. How are you currently taking this medication (dosage and times per day)? 1 tablet twice a day  3. Are you having a reaction (difficulty breathing--STAT)? no  4. What is your medication issue? Patient would like a manufacturers coupon for the medication.

## 2020-06-07 ENCOUNTER — Ambulatory Visit: Payer: BC Managed Care – PPO | Admitting: Nurse Practitioner

## 2020-06-07 ENCOUNTER — Telehealth: Payer: Self-pay | Admitting: Internal Medicine

## 2020-06-07 ENCOUNTER — Telehealth: Payer: Self-pay | Admitting: Osteopathic Medicine

## 2020-06-07 NOTE — Telephone Encounter (Signed)
Patient called to see if we received fmla paperwork from [place]. Spoke with Erie Noe who advised no.

## 2020-06-07 NOTE — Telephone Encounter (Signed)
Patient left message on voicemail stating that she had a heart attack and will need a hospital follow up appointment. Please call and schedule an appointment for patient. Thanks

## 2020-06-08 ENCOUNTER — Telehealth: Payer: Self-pay | Admitting: Internal Medicine

## 2020-06-08 NOTE — Telephone Encounter (Signed)
New message:    Patient calling stating that some papers where faxed over yesterday from [facility] for her STD please call patient.

## 2020-06-08 NOTE — Telephone Encounter (Signed)
Appointment has been set up with Sarah Kane. Patient wanted something sooner than later. Wanted to make sure that was okay?

## 2020-06-08 NOTE — Telephone Encounter (Signed)
That should be fine as long as it scheduled as a hospital follow up. Thank you

## 2020-06-09 ENCOUNTER — Telehealth: Payer: Self-pay | Admitting: Internal Medicine

## 2020-06-09 NOTE — Telephone Encounter (Signed)
Forms from [place] received on 06/08/20. Signed authorization from the patient not provided. Letter sent to [facility]. 06/09/20 vlm

## 2020-06-10 ENCOUNTER — Other Ambulatory Visit: Payer: Self-pay

## 2020-06-10 ENCOUNTER — Encounter: Payer: Self-pay | Admitting: Nurse Practitioner

## 2020-06-10 ENCOUNTER — Ambulatory Visit (INDEPENDENT_AMBULATORY_CARE_PROVIDER_SITE_OTHER): Payer: BC Managed Care – PPO | Admitting: Nurse Practitioner

## 2020-06-10 VITALS — BP 89/64 | HR 201 | Ht 60.0 in | Wt 173.0 lb

## 2020-06-10 DIAGNOSIS — I10 Essential (primary) hypertension: Secondary | ICD-10-CM

## 2020-06-10 DIAGNOSIS — E785 Hyperlipidemia, unspecified: Secondary | ICD-10-CM | POA: Diagnosis not present

## 2020-06-10 DIAGNOSIS — F418 Other specified anxiety disorders: Secondary | ICD-10-CM | POA: Insufficient documentation

## 2020-06-10 DIAGNOSIS — E1165 Type 2 diabetes mellitus with hyperglycemia: Secondary | ICD-10-CM

## 2020-06-10 DIAGNOSIS — E860 Dehydration: Secondary | ICD-10-CM | POA: Insufficient documentation

## 2020-06-10 DIAGNOSIS — I214 Non-ST elevation (NSTEMI) myocardial infarction: Secondary | ICD-10-CM

## 2020-06-10 DIAGNOSIS — I255 Ischemic cardiomyopathy: Secondary | ICD-10-CM

## 2020-06-10 DIAGNOSIS — I25119 Atherosclerotic heart disease of native coronary artery with unspecified angina pectoris: Secondary | ICD-10-CM

## 2020-06-10 DIAGNOSIS — Z09 Encounter for follow-up examination after completed treatment for conditions other than malignant neoplasm: Secondary | ICD-10-CM | POA: Diagnosis not present

## 2020-06-10 MED ORDER — GLIMEPIRIDE 4 MG PO TABS: ORAL_TABLET | ORAL | 1 refills | Status: DC

## 2020-06-10 MED ORDER — ESCITALOPRAM OXALATE 10 MG PO TABS: 10.0000 mg | ORAL_TABLET | Freq: Every day | ORAL | 1 refills | Status: DC

## 2020-06-10 NOTE — Assessment & Plan Note (Signed)
Type 2 diabetes not well controlled on current regimens.  Her most recent hemoglobin A1c was 8.0. We definitely need to get better control of her blood sugar levels to reduce her cardiovascular risk factors however given the recent start of multiple new medications we will continue with her current medication regimen and plan to follow-up on diabetes at her next visit. She did express an interest in changing Ozempic to a different medication.  I am open to looking into this option when she returns. Continue to utilize a low carbohydrate, low-fat diet and continue with daily walking.

## 2020-06-10 NOTE — Assessment & Plan Note (Addendum)
I strongly suspect dehydration today with elevation of the heart rate and decrease in the patient's blood pressure.  The patient does report very limited liquid intake since coming out of the hospital. EKG was performed in the office today for evaluation of tachycardia.  It does show a ventricular rate of 92 bpm with normal rhythm. T wave abnormality noted on print out appears to be due to artifact.  Discussed at length with the patient the importance of maintaining hydration for her health and prevention of future complications. Spent a considerable amount of time discussing with the patient ways to increase hydration and signs of dehydration. Recommend 1 L bolus of normal saline today in the office to help with fluid resuscitation-patient declines this service today and states that she feels that she will be able to adequately hydrate once she gets home. Discussed with the patient if she is unable to appropriately take in at least 64 ounces of water a day to let us know as she may need additional hydration. Recommend monitoring blood pressure and notify the office if blood pressure remains low. Seek emergency evaluation for dizziness that does not go away, faint feeling, loss of consciousness, or not urinating.

## 2020-06-10 NOTE — Assessment & Plan Note (Signed)
Ischemic cardiomyopathy following recent non-STEMI and subsequent stent placement in the coronary arteries. Recommend the patient continue medications started in the hospital and to continue to follow with cardiology for further recommendations and evaluation.

## 2020-06-10 NOTE — Assessment & Plan Note (Signed)
Hospital discharge follow-up from 05/20/2020 where the patient experienced a non-STEMI and subsequent cardiac catheterization with the placement of 2 stents. Multiple medications were changed at this visit. Encourage the patient to continue the medications that were prescribed as these are important in preventing future cardiovascular decline or subsequent MIs. Encourage the patient to seek counseling for her mental health and recommend starting escitalopram for her mood today.

## 2020-06-10 NOTE — Assessment & Plan Note (Signed)
The patient has a longstanding history of depression and anxiety and reported posttraumatic stress disorder.   She appears anxious and depressed in the office today.  I do feel that she would benefit greatly from formal therapy or counseling as some of the issues that she brings up are longstanding and go back to her childhood.   She declines referral for formal counseling today but does state that she will reach out to her pastor for counseling services. I strongly recommend that she begin escitalopram to see if this can help stabilize her mood.  She is hesitant to start new medications however she does report that she feels the recent MI was brought on by increased stress levels. Plan to follow-up with the patient in approximately 6 weeks to determine the effectiveness of medication.

## 2020-06-10 NOTE — Assessment & Plan Note (Signed)
Coronary artery disease with recent non-STEMI myocardial infarction. Recommend continuing medications started in the hospital and to continue to follow with cardiology.

## 2020-06-10 NOTE — Progress Notes (Signed)
Established Patient Office Visit  Subjective:  Patient ID: Sarah Kane, female    DOB: [birth year]  Age: 49 y.o. MRN: [id number]  CC: Hospital follow-up  HPI Sarah Kane is a 49 year old female presenting today for follow-up after hospital discharge for non-STEMI, ischemic cardiomyopathy, and unstable angina.  She was seen at [facility] on August 26 of this year.  At that time she underwent cardiac catheterization and had 2 cardiac stents placed during that procedure.   On September 1 she was also seen in the emergency room for unstable angina which did resolve with sublingual nitroglycerin x2.  Her risk factors include longstanding diagnoses of type 2 diabetes, hyperlipidemia, and hypertension.  New medications include aspirin 81 mg/day, atorvastatin 80 mg/day, isosorbide 30 mg/day, metoprolol 25 mg twice daily, ticagrelor 90 mg twice daily.   Her longstanding medications include glimepiride 8 mg/day, semaglutide 1 mg injected per week, and sitagliptin 100 mg/day.  Since having the stents placed in her initial discharge from the hospital she reports 1 episode of chest pain requiring nitroglycerin for which she was seen in the emergency room and released.  She endorses that she feels "weird" since her discharge from the hospital. She is also experiencing symptoms of shortness of breath, intermittent palpitations, weakness, intermittent headaches, nausea, and fatigue.  She also reports that her appetite is decreased.  She denies dizziness, weakness, jaw pain, back pain, neck pain, chest heaviness, and sweating for no known cause.   She is not undergoing cardiac rehab. She has seen cardiology since her discharge and has a follow-up already scheduled. An order for cardiac rehab has been placed.   She endorses increased exercise. She is walking 20 minutes a day  She reports that she believes her current symptoms and her MI were brought on from increased stress and anxiety in her life.   She reports that she is caring for an aunt who is like a mother to her and she is also in the middle of dealing with the property issues from her biological mother's estate since she passed away fairly recently.  She also reports that she does not like taking so many medications and would like to know when she can come off of some of these medicines.    She does report that she took herself off of her Wellbutrin approximately 1 month ago.  She states that she was started on this medication due to her mood after her mother passed away but does not feel the medication was effective for her.  She denies formal counseling or psychiatry presently or in the past.  She does report that she has sought counseling from her pastor in the past and feels that studying the Bible and speaking with her pastor and friends is effective enough for her.  She reports that she has not been drinking or eating much lately.  At the time of the visit today she reports that she had not had anything to drink in the last thing that she had drink was a Gatorade before bed last night.  She reports that she drank 120 ounce Coke and a Gatorade yesterday and that was all the liquids that she consumed.  Past Medical History:  Diagnosis Date   Arthritis    Diabetes mellitus without complication (HCC)    Hypertension     Past Surgical History:  Procedure Laterality Date   CESAREAN SECTION  1990   CORONARY STENT INTERVENTION N/A 05/20/2020   Procedure: CORONARY STENT INTERVENTION;  Surgeon: Kathleene Hazel, MD;  Location: [facility] INVASIVE CV LAB;  Service: Cardiovascular;  Laterality: N/A;   LEFT HEART CATH AND CORONARY ANGIOGRAPHY N/A 05/20/2020   Procedure: LEFT HEART CATH AND CORONARY ANGIOGRAPHY;  Surgeon: Kathleene Hazel, MD;  Location: [facility] INVASIVE CV LAB;  Service: Cardiovascular;  Laterality: N/A;   TRIGGER FINGER RELEASE Right    thumb    Family History  Problem Relation Age of Onset   High blood  pressure Mother    Diabetes Mother    Diabetes Maternal Grandmother    High blood pressure Maternal Grandmother    Heart attack Maternal Grandfather    Diabetes Maternal Grandfather    High blood pressure Maternal Grandfather    High blood pressure Paternal Grandmother    High blood pressure Paternal Grandfather    Diabetes Maternal Aunt    Diabetes Maternal Uncle    Skin cancer Maternal Uncle     Social History   Socioeconomic History   Marital status: Divorced    Spouse name: Not on file   Number of children: 3   Years of education: Not on file   Highest education level: Not on file  Occupational History    Employer: [employer]  Tobacco Use   Smoking status: Never Smoker   Smokeless tobacco: Never Used  Vaping Use   Vaping Use: Never used  Substance and Sexual Activity   Alcohol use: No   Drug use: No   Sexual activity: Not Currently    Partners: Male  Other Topics Concern   Not on file  Social History Narrative   Not on file   Social Determinants of Health   Financial Resource Strain:    Difficulty of Paying Living Expenses: Not on file  Food Insecurity:    Worried About Programme researcher, broadcasting/film/video in the Last Year: Not on file   [employer] of Food in the Last Year: Not on file  Transportation Needs:    Lack of Transportation (Medical): Not on file   Lack of Transportation (Non-Medical): Not on file  Physical Activity:    Days of Exercise per Week: Not on file   Minutes of Exercise per Session: Not on file  Stress:    Feeling of Stress : Not on file  Social Connections:    Frequency of Communication with Friends and Family: Not on file   Frequency of Social Gatherings with Friends and Family: Not on file   Attends Religious Services: Not on file   Active Member of Clubs or Organizations: Not on file   Attends Banker Meetings: Not on file   Marital Status: Not on file  Intimate Partner  Violence:    Fear of Current or Ex-Partner: Not on file   Emotionally Abused: Not on file   Physically Abused: Not on file   Sexually Abused: Not on file    Outpatient Medications Prior to Visit  Medication Sig Dispense Refill   acetaminophen (TYLENOL) 500 MG tablet Take 500 mg by mouth every 6 (six) hours as needed.     aspirin 81 MG EC tablet Take 1 tablet (81 mg total) by mouth daily. Swallow whole. 30 tablet 11   atorvastatin (LIPITOR) 80 MG tablet Take 1 tablet (80 mg total) by mouth daily. 90 tablet 3   gabapentin (NEURONTIN) 300 MG capsule TAKE 1 CAPSULE BY MOUTH THREE TIMES DAILY 270 capsule 0   isosorbide mononitrate (IMDUR) 30 MG 24 hr tablet Take 1 tablet (  30 mg total) by mouth daily. 90 tablet 3   metoprolol succinate (TOPROL-XL) 25 MG 24 hr tablet Take 1 tablet (25 mg total) by mouth 2 (two) times daily with a meal. 180 tablet 3   nitroGLYCERIN (NITROSTAT) 0.4 MG SL tablet Place 1 tablet (0.4 mg total) under the tongue every 5 (five) minutes x 3 doses as needed for chest pain. 25 tablet 6   Semaglutide, 1 MG/DOSE, (OZEMPIC, 1 MG/DOSE,) 2 MG/1.5ML SOPN Inject 1 mg into the skin once a week. 12 pen 11   sitaGLIPtin (JANUVIA) 100 MG tablet Take 1 tablet (100 mg total) by mouth daily. 90 tablet 1   ticagrelor (BRILINTA) 90 MG TABS tablet Take 1 tablet (90 mg total) by mouth 2 (two) times daily. 60 tablet 11   Vitamin D, Ergocalciferol, (DRISDOL) 1.25 MG (50000 UNIT) CAPS capsule Take 1 capsule by mouth once a week 12 capsule 0   glimepiride (AMARYL) 4 MG tablet TAKE 2 TABLETS BY MOUTH ONCE DAILY WITH BREAKFAST 180 tablet 0   alendronate (FOSAMAX) 70 MG tablet Take 1 tablet (70 mg total) by mouth once a week. Take with a full glass of water on an empty stomach. (Patient not taking: Reported on 06/10/2020) 16 tablet 0   No facility-administered medications prior to visit.    No Known Allergies    Objective:    Physical Exam Vitals and nursing note reviewed.   Constitutional:      Appearance: She is ill-appearing.  HENT:     Head: Normocephalic.  Eyes:     Extraocular Movements: Extraocular movements intact.     Conjunctiva/sclera: Conjunctivae normal.     Pupils: Pupils are equal, round, and reactive to light.  Neck:     Vascular: No carotid bruit.  Cardiovascular:     Rate and Rhythm: Regular rhythm. Tachycardia present.     Pulses: Normal pulses.     Heart sounds: Normal heart sounds.  Pulmonary:     Effort: Pulmonary effort is normal.     Breath sounds: Normal breath sounds.  Abdominal:     General: Abdomen is flat. Bowel sounds are normal. There is no distension.     Palpations: Abdomen is soft.     Tenderness: There is no abdominal tenderness.  Musculoskeletal:     Cervical back: Normal range of motion and neck supple.  Skin:    General: Skin is warm and dry.     Capillary Refill: Capillary refill takes less than 2 seconds.  Neurological:     General: No focal deficit present.     Mental Status: She is alert and oriented to person, place, and time.     Sensory: No sensory deficit.     Motor: Weakness present.     Coordination: Coordination normal.     Gait: Gait normal.  Psychiatric:        Attention and Perception: Attention normal.        Mood and Affect: Mood is anxious.        Speech: Speech is rapid and pressured.        Behavior: Behavior is cooperative.        Thought Content: Thought content normal.        Cognition and Memory: Cognition normal.        Judgment: Judgment normal.     BP (!) 89/64    Pulse (!) 201    Ht 5' (1.524 m)    Wt 173 lb (78.5 kg)    BMI  33.79 kg/m  Wt Readings from Last 3 Encounters:  06/10/20 173 lb (78.5 kg)  06/03/20 172 lb 6.4 oz (78.2 kg)  05/22/20 174 lb 12.8 oz (79.3 kg)     Health Maintenance Due  Topic Date Due   Hepatitis C Screening  Never done   OPHTHALMOLOGY EXAM  Never done   COVID-19 Vaccine (1) Never done   HIV Screening  Never done   PAP SMEAR-Modifier   Never done   URINE MICROALBUMIN  07/06/2019   INFLUENZA VACCINE  Never done    There are no preventive care reminders to display for this patient.  Lab Results  Component Value Date   TSH 1.494 05/20/2020   Lab Results  Component Value Date   WBC 10.3 06/03/2020   HGB 16.8 (H) 06/03/2020   HCT 48.9 (H) 06/03/2020   MCV 85 06/03/2020   PLT 268 06/03/2020   Lab Results  Component Value Date   NA 137 05/21/2020   K 4.4 05/21/2020   CO2 24 05/21/2020   GLUCOSE 171 (H) 05/21/2020   BUN 8 05/21/2020   CREATININE 0.84 05/21/2020   BILITOT 0.4 08/20/2019   AST 27 08/20/2019   ALT 44 (H) 08/20/2019   PROT 6.8 08/20/2019   CALCIUM 8.9 05/21/2020   ANIONGAP 8 05/21/2020   Lab Results  Component Value Date   CHOL 174 05/21/2020   Lab Results  Component Value Date   HDL 42 05/21/2020   Lab Results  Component Value Date   LDLCALC 102 (H) 05/21/2020   Lab Results  Component Value Date   TRIG 148 05/21/2020   Lab Results  Component Value Date   CHOLHDL 4.1 05/21/2020   Lab Results  Component Value Date   HGBA1C 8.0 (H) 05/20/2020      Assessment & Plan:   Problem List Items Addressed This Visit      Cardiovascular and Mediastinum   Essential hypertension    History of hypertension with recent myocardial infarction and stent placement.  Her blood pressures in the office are low today and she is tachycardic. Upon investigation it has been discovered that she is consuming very little liquid intake since being released from the hospital.  Is unclear what could be causing this however it is clearly affecting her blood pressure. Recommended and encouraged a bolus of IV fluids today to help with hydration, unfortunately she declined this treatment today. Strongly encouraged patient to increase her liquid intake to a minimum of 64 ounces of water a day.  Encourage the patient to avoid drinks that are high in sugar or caffeine as these would not be effective hydrate her  as for her. Discussed ways that she can increase hydration and stressed the importance of maintaining hydration especially in the setting of cardiac disease. Recommend that she maintain the new medications prescribed for her cardiovascular disease and encouraged her to follow-up with a cardiologist as scheduled.       NSTEMI (non-ST elevated myocardial infarction) (HCC)    Non-STEMI MI on 05/20/2020 and subsequent placement of 2 stents. She is still experiencing some symptoms of fatigue, weakness, and shortness of breath.  Discussed that some of the medications can cause this but the side effects typically dissipate with time. Discussed at length the significance of the event that occurred and the effect this can have on the mental status as well as the body. Strongly recommended that the patient seek formal counseling for her anxiety and depression and also recommend that she start  daily medication to help with this. She does express hesitancy on starting another medication and she reports that she will follow up with her pastor to discuss counseling.  She declines formal counseling services or psychiatry today. Discussed with the patient the importance of continuing medications started in the hospital and the effect they have on preventing future myocardial infarction or other cardiovascular conditions. Continue to monitor your diet and avoid foods high in carbohydrates, fats, and cholesterol.  Continue daily walking for a minimum of 20 minutes. Cardiac rehab has been ordered by cardiology-informed the patient that they should be contacting her soon. Recommend the patient continue to follow with cardiology.   We will follow along and provide supportive care.      CAD (coronary artery disease)    Coronary artery disease with recent non-STEMI myocardial infarction. Recommend continuing medications started in the hospital and to continue to follow with cardiology.      Ischemic cardiomyopathy     Ischemic cardiomyopathy following recent non-STEMI and subsequent stent placement in the coronary arteries. Recommend the patient continue medications started in the hospital and to continue to follow with cardiology for further recommendations and evaluation.      Relevant Orders   EKG 12-Lead     Endocrine   Type 2 diabetes mellitus with hyperglycemia, without long-term current use of insulin (HCC)    Type 2 diabetes not well controlled on current regimens.  Her most recent hemoglobin A1c was 8.0. We definitely need to get better control of her blood sugar levels to reduce her cardiovascular risk factors however given the recent start of multiple new medications we will continue with her current medication regimen and plan to follow-up on diabetes at her next visit. She did express an interest in changing Ozempic to a different medication.  I am open to looking into this option when she returns. Continue to utilize a low carbohydrate, low-fat diet and continue with daily walking.      Relevant Medications   glimepiride (AMARYL) 4 MG tablet     Other   Hyperlipidemia LDL goal <70    History of hyperlipidemia in the setting of recent non-STEMI.  Her LDL goal is less than 70 most recent was noted to be at 109. Recommend that she continue atorvastatin and discussed the importance of the effect this medication has on cardiovascular disease and her cardiovascular health. Recommended diet low in carbohydrates, saturated fats, and cholesterol. Continue with daily walks of at least 20 minutes and increase as he can. Continue to follow with cardiology.      Hospital discharge follow-up - Primary    Hospital discharge follow-up from 05/20/2020 where the patient experienced a non-STEMI and subsequent cardiac catheterization with the placement of 2 stents. Multiple medications were changed at this visit. Encourage the patient to continue the medications that were prescribed as these are  important in preventing future cardiovascular decline or subsequent MIs. Encourage the patient to seek counseling for her mental health and recommend starting escitalopram for her mood today.      Anxiety with depression    The patient has a longstanding history of depression and anxiety and reported posttraumatic stress disorder.   She appears anxious and depressed in the office today.  I do feel that she would benefit greatly from formal therapy or counseling as some of the issues that she brings up are longstanding and go back to her childhood.   She declines referral for formal counseling today but does state that  she will reach out to her pastor for counseling services. I strongly recommend that she begin escitalopram to see if this can help stabilize her mood.  She is hesitant to start new medications however she does report that she feels the recent MI was brought on by increased stress levels. Plan to follow-up with the patient in approximately 6 weeks to determine the effectiveness of medication.      Relevant Medications   escitalopram (LEXAPRO) 10 MG tablet   Dehydration    I strongly suspect dehydration today with elevation of the heart rate and decrease in the patient's blood pressure.  The patient does report very limited liquid intake since coming out of the hospital. Discussed at length with the patient the importance of maintaining hydration for her health and prevention of future complications. Spent a considerable amount of time discussing with the patient ways to increase hydration and signs of dehydration. Recommend 1 L bolus of normal saline today in the office to help with fluid resuscitation-patient declines this service today and states that she feels that she will be able to adequately hydrate once she gets home. Discussed with the patient if she is unable to appropriately take in at least 64 ounces of water a day to let us know as she may need additional  hydration. Recommend monitoring blood pressure and notify the office if blood pressure remains low. Seek emergency evaluation for dizziness that does not go away, faint feeling, loss of consciousness, or not urinating.         Meds ordered this encounter  Medications   escitalopram (LEXAPRO) 10 MG tablet    Sig: Take 1 tablet (10 mg total) by mouth daily.    Dispense:  30 tablet    Refill:  1   glimepiride (AMARYL) 4 MG tablet    Sig: TAKE 2 TABLETS BY MOUTH ONCE DAILY WITH BREAKFAST    Dispense:  180 tablet    Refill:  1   EKG: unchanged from previous tracings, normal sinus rhythm.   Follow-up: Return in about 6 weeks (around 07/22/2020) for For Mood- virtual .   62 minutes of time was spent face to face with the patient with greater than 50% of this time spent with education and management of both her acute and chronic conditions.  Tollie Eth, NP

## 2020-06-10 NOTE — Assessment & Plan Note (Addendum)
Non-STEMI MI on 05/20/2020 and subsequent placement of 2 stents. She is still experiencing some symptoms of fatigue, weakness, and shortness of breath.  Discussed that some of the medications can cause this but the side effects typically dissipate with time. Discussed at length the significance of the event that occurred and the effect this can have on the mental status as well as the body. Strongly recommended that the patient seek formal counseling for her anxiety and depression and also recommend that she start daily medication to help with this. She does express hesitancy on starting another medication and she reports that she will follow up with her pastor to discuss counseling.  She declines formal counseling services or psychiatry today. Discussed with the patient the importance of continuing medications started in the hospital and the effect they have on preventing future myocardial infarction or other cardiovascular conditions. Continue to monitor your diet and avoid foods high in carbohydrates, fats, and cholesterol.  Continue daily walking for a minimum of 20 minutes. Cardiac rehab has been ordered by cardiology-informed the patient that they should be contacting her soon. Recommend the patient continue to follow with cardiology.   We will follow along and provide supportive care.

## 2020-06-10 NOTE — Assessment & Plan Note (Signed)
History of hypertension with recent myocardial infarction and stent placement.  Her blood pressures in the office are low today and she is tachycardic. Upon investigation it has been discovered that she is consuming very little liquid intake since being released from the hospital.  Is unclear what could be causing this however it is clearly affecting her blood pressure. Recommended and encouraged a bolus of IV fluids today to help with hydration, unfortunately she declined this treatment today. Strongly encouraged patient to increase her liquid intake to a minimum of 64 ounces of water a day.  Encourage the patient to avoid drinks that are high in sugar or caffeine as these would not be effective hydrate her as for her. Discussed ways that she can increase hydration and stressed the importance of maintaining hydration especially in the setting of cardiac disease. Recommend that she maintain the new medications prescribed for her cardiovascular disease and encouraged her to follow-up with a cardiologist as scheduled.

## 2020-06-10 NOTE — Assessment & Plan Note (Signed)
History of hyperlipidemia in the setting of recent non-STEMI.  Her LDL goal is less than 70 most recent was noted to be at 109. Recommend that she continue atorvastatin and discussed the importance of the effect this medication has on cardiovascular disease and her cardiovascular health. Recommended diet low in carbohydrates, saturated fats, and cholesterol. Continue with daily walks of at least 20 minutes and increase as he can. Continue to follow with cardiology.

## 2020-06-10 NOTE — Patient Instructions (Addendum)
You need to increase your fluid intake to at least 8, 8 ounce glasses of water a day. It is very, very  important that you stay hydrated. Dehydration can lead to thickening of your blood, which can increase your risk of blood clots and cause damage to your kidneys and your heart.   I strongly recommend that you seek counseling, either from your pastor or from formal counseling services for your ongoing stress. I would also like to start a medication called Lexapro for your mood to help your mood and dealing with the stress of the recent heart attack and the other stressors in your life. This will not be a miracle fix, but it should help with some of the symptoms you are experiencing.    Continue your other medications as prescribed and follow-up with your cardiologist as directed.    Managing Stress, Adult Feeling a certain amount of stress is normal. Stress helps our body and mind get ready to deal with the demands of life. Stress hormones can motivate you to do well at work and meet your responsibilities. However severe or long-lasting (chronic) stress can affect your mental and physical health. Chronic stress puts you at higher risk for anxiety, depression, and other health problems like digestive problems, muscle aches, heart disease, high blood pressure, and stroke. What are the causes? Common causes of stress include:  Demands from work, such as deadlines, feeling overworked, or having long hours.  Pressures at home, such as money issues, disagreements with a spouse, or parenting issues.  Pressures from major life changes, such as divorce, moving, loss of a loved one, or chronic illness. You may be at higher risk for stress-related problems if you do not get enough sleep, are in poor health, do not have emotional support, or have a mental health disorder like anxiety or depression. How to recognize stress Stress can make you:  Have trouble sleeping.  Feel sad, anxious, irritable, or  overwhelmed.  Lose your appetite.  Overeat or want to eat unhealthy foods.  Want to use drugs or alcohol. Stress can also cause physical symptoms, such as:  Sore, tense muscles, especially in the shoulders and neck.  Headaches.  Trouble breathing.  A faster heart rate.  Stomach pain, nausea, or vomiting.  Diarrhea or constipation.  Trouble concentrating. Follow these instructions at home: Lifestyle  Identify the source of your stress and your reaction to it. See a therapist who can help you change your reactions.  When there are stressful events: ? Talk about it with family, friends, or co-workers. ? Try to think realistically about stressful events and not ignore them or overreact. ? Try to find the positives in a stressful situation and not focus on the negatives. ? Cut back on responsibilities at work and home, if possible. Ask for help from friends or family members if you need it.  Find ways to cope with stress, such as: ? Meditation. ? Deep breathing. ? Yoga or tai chi. ? Progressive muscle relaxation. ? Doing art, playing music, or reading. ? Making time for fun activities. ? Spending time with family and friends.  Get support from family, friends, or spiritual resources. Eating and drinking  Eat a healthy diet. This includes: ? Eating foods that are high in fiber, such as beans, whole grains, and fresh fruits and vegetables. ? Limiting foods that are high in fat and processed sugars, such as fried and sweet foods.  Do not skip meals or overeat.  Drink enough fluid  to keep your urine pale yellow. Alcohol use  Do not drink alcohol if: ? Your health care provider tells you not to drink. ? You are pregnant, may be pregnant, or are planning to become pregnant.  Drinking alcohol is a way some people try to ease their stress. This can be dangerous, so if you drink alcohol: ? Limit how much you use to:  0-1 drink a day for women.  0-2 drinks a day for  men. ? Be aware of how much alcohol is in your drink. In the [place] one drink equals one 12 oz bottle of beer (355 mL), one 5 oz glass of wine (148 mL), or one 1 oz glass of hard liquor (44 mL). Activity   Include 30 minutes of exercise in your daily schedule. Exercise is a good stress reducer.  Include time in your day for an activity that you find relaxing. Try taking a walk, going on a bike ride, reading a book, or listening to music.  Schedule your time in a way that lowers stress, and keep a consistent schedule. Prioritize what is most important to get done. General instructions  Get enough sleep. Try to go to sleep and get up at about the same time every day.  Take over-the-counter and prescription medicines only as told by your health care provider.  Do not use any products that contain nicotine or tobacco, such as cigarettes, e-cigarettes, and chewing tobacco. If you need help quitting, ask your health care provider.  Do not use drugs or smoke to cope with stress.  Keep all follow-up visits as told by your health care provider. This is important. Where to find support  Talk with your health care provider about stress management or finding a support group.  Find a therapist to work with you on your stress management techniques. Contact a health care provider if:  Your stress symptoms get worse.  You are unable to manage your stress at home.  You are struggling to stop using drugs or alcohol. Get help right away if:  You may be a danger to yourself or others.  You have any thoughts of death or suicide. If you ever feel like you may hurt yourself or others, or have thoughts about taking your own life, get help right away. You can go to your nearest emergency department or call:  Your local emergency services (911 in the [place]  A suicide crisis helpline, such as the [place] at 814-652-4852. This is open 24 hours a day. Summary  Feeling  a certain amount of stress is normal, but severe or long-lasting (chronic) stress can affect your mental and physical health.  Chronic stress can put you at higher risk for anxiety, depression, and other health problems like digestive problems, muscle aches, heart disease, high blood pressure, and stroke.  You may be at higher risk for stress-related problems if you do not get enough sleep, are in poor health, lack emotional support, or have a mental health disorder like anxiety or depression.  Identify the source of your stress and your reaction to it. Try talking about stressful events with family, friends, or co-workers, finding a coping method, or getting support from spiritual resources.  If you need more help, talk with your health care provider about finding a support group or a mental health therapist. This information is not intended to replace advice given to you by your health care provider. Make sure you discuss any questions you have with your  health care provider. Document Revised: 04/09/2019 Document Reviewed: 04/09/2019 Elsevier Patient Education  [place].

## 2020-06-11 NOTE — Telephone Encounter (Signed)
Sarah Kane is calling stating [employer] advised her they never recieved this fax. Please advise.

## 2020-06-11 NOTE — Telephone Encounter (Signed)
Patient called to follow up, requesting to speak with Medical Records. She stated [employer] has not yet received short term disability forms. She states she would like to have forms resubmitted.

## 2020-06-14 ENCOUNTER — Telehealth: Payer: Self-pay | Admitting: Internal Medicine

## 2020-06-14 NOTE — Telephone Encounter (Signed)
Forms from [facility] received on 06/14/20. Completed patient auth attached. Took form to Dr. Charlott Rakes box for completion. 06/14/20 vlm

## 2020-06-17 NOTE — Progress Notes (Addendum)
Cardiology Office Note   Date:  06/19/2020   ID:  Sarah Kane, DOB [birth year], MRN [id number]  PCP:  Sunnie Nielsen, DO  Cardiologist:   Dietrich Pates, MD   Pt presents for f/u of CAD    History of Present Illness: Sarah Kane is a 49 y.o. female with a history ofHTN and DM and CAD.  She was admitted in  04/2020 with an NSTEMI. LHC on  05/20/20 showed diffuse CAD:  30% pLAD; 99% dLAD; 70% prox LCx; 100% mRCA; 70% dRCA   SHe underwent PCI/DES to RCA with resolution of symptoms   Plan was to follow clinically  Consider staged PCI of LCx if refractory symptoms  Echo showed LVEF 45 to 50% Losartan held due to soft BP   The pt was seen by BBhagat on 9.9.21  Still with some mild discomfort   Imdur added   Since then the pt notes a little uncomfortable sensation in chest    Not doing much   Overall anxious   Current Meds  Medication Sig  . acetaminophen (TYLENOL) 500 MG tablet Take 500 mg by mouth every 6 (six) hours as needed.  [name] aspirin 81 MG EC tablet Take 1 tablet (81 mg total) by mouth daily. Swallow whole.  [name] atorvastatin (LIPITOR) 80 MG tablet Take 1 tablet (80 mg total) by mouth daily.  [name] gabapentin (NEURONTIN) 300 MG capsule TAKE 1 CAPSULE BY MOUTH THREE TIMES DAILY  . glimepiride (AMARYL) 4 MG tablet TAKE 2 TABLETS BY MOUTH ONCE DAILY WITH BREAKFAST  . isosorbide mononitrate (IMDUR) 30 MG 24 hr tablet Take 1 tablet (30 mg total) by mouth daily.  . metoprolol succinate (TOPROL-XL) 25 MG 24 hr tablet Take 1 tablet (25 mg total) by mouth 2 (two) times daily with a meal.  . nitroGLYCERIN (NITROSTAT) 0.4 MG SL tablet Place 1 tablet (0.4 mg total) under the tongue every 5 (five) minutes x 3 doses as needed for chest pain.  . Semaglutide, 1 MG/DOSE, (OZEMPIC, 1 MG/DOSE,) 2 MG/1.5ML SOPN Inject 1 mg into the skin once a week.  . sitaGLIPtin (JANUVIA) 100 MG tablet Take 1 tablet (100 mg total) by mouth daily.  . ticagrelor (BRILINTA) 90 MG TABS tablet Take 1 tablet (90 mg total) by mouth 2  (two) times daily.  . Vitamin D, Ergocalciferol, (DRISDOL) 1.25 MG (50000 UNIT) CAPS capsule Take 1 capsule by mouth once a week     Allergies:   Patient has no known allergies.   Past Medical History:  Diagnosis Date  . Arthritis   . Diabetes mellitus without complication (HCC)   . Hypertension     Past Surgical History:  Procedure Laterality Date  . CESAREAN SECTION  1990  . CORONARY STENT INTERVENTION N/A 05/20/2020   Procedure: CORONARY STENT INTERVENTION;  Surgeon: Kathleene Hazel, MD;  Location: [facility]VASIVE [facility];  Service: Cardiovascular;  Laterality: N/A;  . LEFT HEART CATH AND CORONARY ANGIOGRAPHY N/A 05/20/2020   Procedure: LEFT HEART CATH AND CORONARY ANGIOGRAPHY;  Surgeon: Kathleene Hazel, MD;  Location: [facility] INVASIVE CV LAB;  Service: Cardiovascular;  Laterality: N/A;  . TRIGGER FINGER RELEASE Right    thumb     Social History:  The patient  reports that she has never smoked. She has never used smokeless tobacco. She reports that she does not drink alcohol and does not use drugs.   Family History:  The patient's family history includes Diabetes in her maternal aunt, maternal grandfather, maternal grandmother,  maternal uncle, and mother; Heart attack in her maternal grandfather; High blood pressure in her maternal grandfather, maternal grandmother, mother, paternal grandfather, and paternal grandmother; Skin cancer in her maternal uncle.    ROS:  Please see the history of present illness. All other systems are reviewed and  Negative to the above problem except as noted.    PHYSICAL EXAM: VS:  BP 134/68   Pulse 90   Ht 5' (1.524 m)   Wt 173 lb 12.8 oz (78.8 kg)   SpO2 99%   BMI 33.94 kg/m   GEN: Obese 49 yo in no acute distress  HEENT: normal  Neck: no JVD, carotid bruits Cardiac: RRR; no murmurs; ,no  LE edema  Respiratory:  clear to auscultation bilaterally, GI: soft, nontender, nondistended, + BS  No hepatomegaly  MS: no deformity Moving all  extremities   Skin: warm and dry, no rash Neuro:  Strength and sensation are intact Psych: euthymic mood, full affect   EKG:  EKG is ordered today.  SR 90 bpm    Cardiac Procedures  Left heart catheterization 05/20/20:  Prox RCA lesion is 30% stenosed.  Mid RCA lesion is 100% stenosed.  Prox Cx to Mid Cx lesion is 70% stenosed.  Dist LAD lesion is 99% stenosed.  Prox LAD to Dist LAD lesion is 30% stenosed.  A drug-eluting stent was successfully placed using a SYNERGY XD 2.75X20.  Post intervention, there is a 0% residual stenosis.  A drug-eluting stent was successfully placed using a SYNERGY XD 2.25X12.  Dist RCA-1 lesion is 70% stenosed.  Post intervention, there is a 0% residual stenosis.  Dist RCA-2 lesion is 70% stenosed with 70% stenosed side branch in RPAV.  1. Acute inferior MT/NSTEMI secondary to occlusion of the mid RCA. This is a dominant vessel.  2. Successful PTCA/DES x 2 mid and distal RCA 3. The LAD is a moderate caliber vessel that courses to the apex. The distal vessel is diabetic appearing, small in caliber and diffusely disease. Not a target for PCI.  4. 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   Recommendations: Pt pain free post PCI. Will continue Aggrastat for 2 hours post PCI. Continue DAPT with ASA and Brilinta for one year. Echo in am. Continue high intensity statin. Staged PCI of the Circumflex possibly tomorrow. Her distal vessel are diffusely diseased with diabetic appearance and are not targets for PCI. Diagnostic Dominance: Right  Intervention     Echocardiogram 05/21/20: 1. Left ventricular ejection fraction, by estimation, is 45 to 50%. The  left ventricle has mildly decreased function. The left ventricle  demonstrates regional wall motion abnormalities (see scoring  diagram/findings for description). Left ventricular  diastolic parameters are consistent with Grade I diastolic dysfunction  (impaired relaxation).  2.  Right ventricular systolic function is normal. The right ventricular  size is normal.  3. The mitral valve is normal in structure. No evidence of mitral valve  regurgitation. No evidence of mitral stenosis.  4. The aortic valve is normal in structure. Aortic valve regurgitation is  not visualized. No aortic stenosis is present.  5. The inferior vena cava is normal in size with greater than 50%  respiratory variability, suggesting right atrial pressure of 3 mmHg.      Lipid Panel    Component Value Date/Time   CHOL 174 05/21/2020 0438   TRIG 148 05/21/2020 0438   HDL 42 05/21/2020 0438   CHOLHDL 4.1 05/21/2020 0438   VLDL 30 05/21/2020 0438   LDLCALC  102 (H) 05/21/2020 0438      Wt Readings from Last 3 Encounters:  06/18/20 173 lb 12.8 oz (78.8 kg)  06/10/20 173 lb (78.5 kg)  06/03/20 172 lb 6.4 oz (78.2 kg)      ASSESSMENT AND PLAN:  1   CAD   Keep on same meds     I am not convinced of active angina   Volume status OK    I would like to have the pt start cardiac rehab to objectify symptoms, eval tolerance Check CBC  Continue ASA and Brilinta   2   HL   Keep on lipitor  Check Lipids   3  DM  Discussed diet  Pt snacking a lot   Counselled on snacks, carbs  I would like the pt to stay out of work until cardiac rehab done, at least in part.  Pt says once at work there will be nothing below 100%  F/U 3 months    Current medicines are reviewed at length with the patient today.  The patient does not have concerns regarding medicines.  Signed, Dietrich Pates, MD  06/19/2020 4:56 PM    [facility] [address] [place], [place], [place]  [address] Phone: [phone]; Fax: [phone]

## 2020-06-18 ENCOUNTER — Other Ambulatory Visit: Payer: Self-pay

## 2020-06-18 ENCOUNTER — Ambulatory Visit (INDEPENDENT_AMBULATORY_CARE_PROVIDER_SITE_OTHER): Payer: BC Managed Care – PPO | Admitting: Internal Medicine

## 2020-06-18 ENCOUNTER — Encounter: Payer: Self-pay | Admitting: Internal Medicine

## 2020-06-18 VITALS — BP 134/68 | HR 90 | Ht 60.0 in | Wt 173.8 lb

## 2020-06-18 DIAGNOSIS — I2510 Atherosclerotic heart disease of native coronary artery without angina pectoris: Secondary | ICD-10-CM

## 2020-06-18 DIAGNOSIS — I10 Essential (primary) hypertension: Secondary | ICD-10-CM

## 2020-06-18 LAB — BASIC METABOLIC PANEL
BUN/Creatinine Ratio: 20 (ref 9–23)
BUN: 18 mg/dL (ref 6–24)
CO2: 24 mmol/L (ref 20–29)
Calcium: 9.9 mg/dL (ref 8.7–10.2)
Chloride: 100 mmol/L (ref 96–106)
Creatinine, Ser: 0.9 mg/dL (ref 0.57–1.00)
GFR calc Af Amer: 86 mL/min/{1.73_m2} (ref >59)
GFR calc non Af Amer: 75 mL/min/{1.73_m2} (ref >59)
Glucose: 205 mg/dL — ABNORMAL HIGH (ref 65–99)
Potassium: 4.8 mmol/L (ref 3.5–5.2)
Sodium: 138 mmol/L (ref 134–144)

## 2020-06-18 LAB — CBC
Hematocrit: 48.2 % — ABNORMAL HIGH (ref 34.0–46.6)
Hemoglobin: 16.2 g/dL — ABNORMAL HIGH (ref 11.1–15.9)
MCH: 29 pg (ref 26.6–33.0)
MCHC: 33.6 g/dL (ref 31.5–35.7)
MCV: 86 fL (ref 79–97)
Platelets: 221 10*3/uL (ref 150–450)
RBC: 5.59 x10E6/uL — ABNORMAL HIGH (ref 3.77–5.28)
RDW: 12.5 % (ref 11.7–15.4)
WBC: 8.7 10*3/uL (ref 3.4–10.8)

## 2020-06-18 NOTE — Patient Instructions (Signed)
Medication Instructions:  No changes today *If you need a refill on your cardiac medications before your next appointment, please call your pharmacy*   Lab Work: Today: cbc, lipids If you have labs (blood work) drawn today and your tests are completely normal, you will receive your results only by: [name] MyChart Message (if you have MyChart) OR . A paper copy in the mail If you have any lab test that is abnormal or we need to change your treatment, we will call you to review the results.   Testing/Procedures: none   Follow-Up: At [facility], you and your health needs are our priority.  As part of our continuing mission to provide you with exceptional heart care, we have created designated Provider Care Teams.  These Care Teams include your primary Cardiologist (physician) and Advanced Practice Providers (APPs -  Physician Assistants and Nurse Practitioners) who all work together to provide you with the care you need, when you need it.  Your next appointment:   4 month(s)  The format for your next appointment:   In Person  Provider:   You may see Dietrich Pates, MD or one of the following Advanced Practice Providers on your designated Care Team:    Tereso Newcomer, PA-C  Chelsea Aus, [place]    Other Instructions You have been referred to Cardiac Rehab at [facility].

## 2020-06-18 NOTE — Telephone Encounter (Signed)
Completed, signed forms placed on medical records desk.

## 2020-06-19 ENCOUNTER — Telehealth: Payer: Self-pay | Admitting: Internal Medicine

## 2020-06-19 NOTE — Telephone Encounter (Signed)
Please set pt up for appt in early December, not 4 months

## 2020-06-21 ENCOUNTER — Telehealth: Payer: Self-pay | Admitting: Internal Medicine

## 2020-06-21 NOTE — Telephone Encounter (Signed)
Follow up:    Patient calling to check the status of her paper work. Please call patient.

## 2020-06-21 NOTE — Telephone Encounter (Signed)
Fu message  Pt called in and has some additional question about her [employer] Paperwork. She wanted to check on the status and see if it has been sent in?   Best cb number - [phone]

## 2020-06-21 NOTE — Telephone Encounter (Signed)
Called patient and informed that the paperwork has been completed by Dr. Ross and given to medical records and it is being faxed to [employer].  She will pick up copy when she comes back to [place].  Also moved her appointment up from Jan 2022 to December per message from Dr. Ross.  

## 2020-06-21 NOTE — Telephone Encounter (Signed)
[facility] received completed FMLA Paperwork, faxed to [place].  Med Recs will leave a copy at the front desk for patient to pick up per her request.  06/21/20  [facility]

## 2020-06-21 NOTE — Telephone Encounter (Signed)
Called patient and informed that the paperwork has been completed by Dr. Tenny Craw and given to medical records and it is being faxed to [facility].  She will pick up copy when she comes back to [place].  Also moved her appointment up from Jan 2022 to December per message from Dr. Tenny Craw.

## 2020-06-22 ENCOUNTER — Encounter: Payer: Self-pay | Admitting: Physician Assistant

## 2020-06-22 ENCOUNTER — Telehealth (INDEPENDENT_AMBULATORY_CARE_PROVIDER_SITE_OTHER): Payer: BC Managed Care – PPO | Admitting: Physician Assistant

## 2020-06-22 VITALS — Ht 60.0 in | Wt 173.0 lb

## 2020-06-22 DIAGNOSIS — I214 Non-ST elevation (NSTEMI) myocardial infarction: Secondary | ICD-10-CM

## 2020-06-22 DIAGNOSIS — I252 Old myocardial infarction: Secondary | ICD-10-CM | POA: Diagnosis not present

## 2020-06-22 DIAGNOSIS — R05 Cough: Secondary | ICD-10-CM | POA: Diagnosis not present

## 2020-06-22 DIAGNOSIS — Z20822 Contact with and (suspected) exposure to covid-19: Secondary | ICD-10-CM

## 2020-06-22 DIAGNOSIS — I25119 Atherosclerotic heart disease of native coronary artery with unspecified angina pectoris: Secondary | ICD-10-CM

## 2020-06-22 DIAGNOSIS — R059 Cough, unspecified: Secondary | ICD-10-CM

## 2020-06-22 MED ORDER — BENZONATATE 200 MG PO CAPS: 200.0000 mg | ORAL_CAPSULE | Freq: Two times a day (BID) | ORAL | 0 refills | Status: DC | PRN

## 2020-06-22 NOTE — Progress Notes (Signed)
Patient ID: Sarah Kane, female   DOB: Oct 21, 1970, 49 y.o.   MRN: [id number] .Marland KitchenVirtual Visit via Video Note  I connected with Sarah Kane on 9/28/2021at  2:40 PM EDT by a video enabled telemedicine application and verified that I am speaking with the correct person using two identifiers.  Location: Patient: home Provider: clinic   I discussed the limitations of evaluation and management by telemedicine and the availability of in person appointments. The patient expressed understanding and agreed to proceed.  History of Present Illness: Patient is a 49 year old with recent MI, CAD diagnosis and T2DM and HTN  who calls into the clinic to discuss new cough, congestion, headache for the last 2 days.  Patient could not get the MyChart video to work.  We only use phone call.  Patient went to the emergency department on 05/20/2020 with chest pain symptoms and diagnosed with a non-STEMI and underwent LHC. She has been weak and tired and short of breath this whole time since MI.  She no has cough started couple days ago that is making things worse.  She is mostly at home but she does go to the grocery store and out in public at times.  She has a exposure to strep but she denies a sore throat.  She has a terrible headache, body aches and seems to become more winded.  No worsening chest pain and tightness.  Patient denies any leg swelling or tenderness.  She does not have a pulse oximeter in her house.  She does not avoid to check her temperature.  Patient does live alone.  No history of asthma or COPD.  She does not smoke.   .. Active Ambulatory Problems    Diagnosis Date Noted  . Arthritis of left acromioclavicular joint 10/03/2017  . Carpal tunnel syndrome 03/06/2017  . Complex regional pain syndrome 06/16/2016  . Joint synovitis 02/07/2016  . Right hand pain 10/02/2016  . Subacromial bursitis 10/03/2017  . Trigger finger of right thumb 03/03/2016  . Chronic pain 06/28/2018  . ANA positive  06/28/2018  . 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  . Dupuytren's contracture of both hands 07/26/2018  . Tear of left supraspinatus tendon 08/14/2018  . Essential hypertension 10/29/2018  . Pain of skin 08/23/2019  . Type 2 diabetes mellitus with hyperglycemia, without long-term current use of insulin (HCC) 10/23/2019  . NSTEMI (non-ST elevated myocardial infarction) (HCC) 05/20/2020  . Hyperlipidemia LDL goal <70 05/22/2020  . CAD (coronary artery disease) 05/22/2020  . Ischemic cardiomyopathy 05/22/2020  . Hospital discharge follow-up 06/10/2020  . Anxiety with depression 06/10/2020  . Dehydration 06/10/2020   Resolved Ambulatory Problems    Diagnosis Date Noted  . No Resolved Ambulatory Problems   Past Medical History:  Diagnosis Date  . Arthritis   . Diabetes mellitus without complication (HCC)   . Hypertension    Reviewed med, allergies, problem list.      Observations/Objective: No acute distress Dry cough. Congested sounding.  No wheezing or labored breathing.   .. Today's Vitals   06/22/20 1430  Weight: 173 lb (78.5 kg)  Height: 5' (1.524 m)   Body mass index is 33.79 kg/m.    Assessment and Plan: Marland KitchenMarland KitchenLondon was seen today for nasal congestion.  Diagnoses and all orders for this visit:  Suspected COVID-19 virus infection -     Novel Coronavirus, NAA ([facility])  Cough -     benzonatate (TESSALON) 200 MG capsule; Take 1 capsule (200 mg total) by mouth 2 (  two) times daily as needed for cough. -     Novel Coronavirus, NAA ([facility])  History of MI (myocardial infarction)  NSTEMI (non-ST elevated myocardial infarction) (HCC)  Coronary artery disease involving native heart with angina pectoris, unspecified vessel or lesion type (HCC)   I am very concerned about her having Covid with recent MI history.  Her risk of complications would be very high. Instructed to come in today to be PCR swab for Covid.  She would be a very strong candidate for  monoclonal antibodies.  Discussed with patient this could also be other viral infections as well.  Encourage symptomatic treatment.  Discussed best over-the-counter cold option for heart patients is Coricidin.  I did send some Tessalon Perles for cough.  Make sure to stay hydrated.  Do watch for any sudden leg pain, leg swelling, shortness of breath and if she were to have these go to the hospital.  Strongly recommend getting a pulse oximeter and managing O2 levels.  Continue to stay walking but certainly get good rest.  Follow-up with any concerns or complaints.  Pt is frustrated with slow improvement of MI symptoms. Discussed this can certainly take time to heal. Continue follow up with cardiology.    Follow Up Instructions:    I discussed the assessment and treatment plan with the patient. The patient was provided an opportunity to ask questions and all were answered. The patient agreed with the plan and demonstrated an understanding of the instructions.   The patient was advised to call back or seek an in-person evaluation if the symptoms worsen or if the condition fails to improve as anticipated.  I provided 20 minutes of non-face-to-face time during this encounter.   Tandy Gaw, PA-C

## 2020-06-22 NOTE — Patient Instructions (Signed)
Coriciden

## 2020-06-22 NOTE — Progress Notes (Signed)
Started two days ago Cough Headache  Chest pain  Has taken tylenol for headache, not helping No other medications  Had recent heart attack and doesn't know what she can take and not sure which symptoms are related Having body aches, weakness, shoulder pain, but states these started after her heart attack, hasn't been to cardiac rehab yet  Was recently around a friend's child who had strep throat No sore throat

## 2020-06-24 ENCOUNTER — Telehealth: Payer: Self-pay | Admitting: Nurse Practitioner

## 2020-06-24 ENCOUNTER — Encounter: Payer: Self-pay | Admitting: Nurse Practitioner

## 2020-06-24 ENCOUNTER — Other Ambulatory Visit: Payer: Self-pay | Admitting: Nurse Practitioner

## 2020-06-24 DIAGNOSIS — I255 Ischemic cardiomyopathy: Secondary | ICD-10-CM

## 2020-06-24 DIAGNOSIS — E1165 Type 2 diabetes mellitus with hyperglycemia: Secondary | ICD-10-CM

## 2020-06-24 DIAGNOSIS — I10 Essential (primary) hypertension: Secondary | ICD-10-CM

## 2020-06-24 DIAGNOSIS — I25119 Atherosclerotic heart disease of native coronary artery with unspecified angina pectoris: Secondary | ICD-10-CM

## 2020-06-24 DIAGNOSIS — U071 COVID-19: Secondary | ICD-10-CM

## 2020-06-24 DIAGNOSIS — I214 Non-ST elevation (NSTEMI) myocardial infarction: Secondary | ICD-10-CM

## 2020-06-24 DIAGNOSIS — E785 Hyperlipidemia, unspecified: Secondary | ICD-10-CM

## 2020-06-24 LAB — SARS-COV-2, NAA 2 DAY TAT

## 2020-06-24 LAB — LIPID CASCADE
Cholesterol, Total: 135 mg/dL (ref 100–199)
HDL: 49 mg/dL (ref >39)
LDL Chol Calc (NIH): 65 mg/dL (ref 0–99)
LDL/HDL Ratio: 1.3 ratio (ref 0.0–3.2)
Total Non-HDL-Chol (LDL+VLDL): 86 mg/dL (ref 0–129)
Triglycerides: 118 mg/dL (ref 0–149)

## 2020-06-24 LAB — LIPOPROTEIN ANALYSIS, BY NMR

## 2020-06-24 LAB — NOVEL CORONAVIRUS, NAA: SARS-CoV-2, NAA: DETECTED — AB

## 2020-06-24 LAB — SPECIMEN STATUS REPORT

## 2020-06-24 NOTE — Telephone Encounter (Signed)
COVID [facility] Contact Note   Qualifiers: BMI >25, Recent MI, HTN, HLD, CAD Symptoms: Cough, congestion, fever, chills, headache  Symptom Onset: 09/25   Contact via telephone to discuss symptoms and evaluate for interest and qualifications for [facility] treatment. Patient qualifies for at-risk group according to the FDA Emergency Use Authorization.   I connected by phone with Sarah Kane on 06/24/2020 at 5:11 PM to discuss the potential use of a new treatment for mild to moderate COVID-19 viral infection in non-hospitalized patients.  This patient is a 49 y.o. female that meets the FDA criteria for Emergency Use Authorization of COVID monoclonal antibody casirivimab/imdevimab or bamlanivimab/eteseviamb.  Has a (+) direct SARS-CoV-2 viral test result  Has mild or moderate COVID-19   Is NOT hospitalized due to COVID-19  Is within 10 days of symptom onset  Has at least one of the high risk factor(s) for progression to severe COVID-19 and/or hospitalization as defined in EUA.  Specific high risk criteria : BMI > 25, Immunosuppressive Disease or Treatment and Cardiovascular disease or hypertension   I have spoken and communicated the following to the patient or parent/caregiver regarding COVID monoclonal antibody treatment:  1. FDA has authorized the emergency use for the treatment of mild to moderate COVID-19 in adults and pediatric patients with positive results of direct SARS-CoV-2 viral testing who are 17 years of age and older weighing at least 40 kg, and who are at high risk for progressing to severe COVID-19 and/or hospitalization.  2. The significant known and potential risks and benefits of COVID monoclonal antibody, and the extent to which such potential risks and benefits are unknown.  3. Information on available alternative treatments and the risks and benefits of those alternatives, including clinical trials.  4. Patients treated with COVID monoclonal antibody should  continue to self-isolate and use infection control measures (e.g., wear mask, isolate, social distance, avoid sharing personal items, clean and disinfect "high touch" surfaces, and frequent handwashing) according to CDC guidelines.   5. The patient or parent/caregiver has the option to accept or refuse COVID monoclonal antibody treatment.  After reviewing this information with the patient, the patient has agreed to receive one of the available covid 19 monoclonal antibodies and will be provided an appropriate fact sheet prior to infusion. Sarah Eth, NP 06/24/2020 5:11 PM        Shawna Clamp, DNP, AGNP-c COVID-19 [facility] [phone]

## 2020-06-25 ENCOUNTER — Ambulatory Visit (HOSPITAL_COMMUNITY)
Admission: RE | Admit: 2020-06-25 | Discharge: 2020-06-25 | Disposition: A | Discharge status: Home / Self Care | Payer: BC Managed Care – PPO | Source: Ambulatory Visit | Attending: Pulmonary Disease | Admitting: Pulmonary Disease

## 2020-06-25 DIAGNOSIS — I10 Essential (primary) hypertension: Secondary | ICD-10-CM

## 2020-06-25 DIAGNOSIS — E785 Hyperlipidemia, unspecified: Secondary | ICD-10-CM | POA: Diagnosis present

## 2020-06-25 DIAGNOSIS — E1165 Type 2 diabetes mellitus with hyperglycemia: Secondary | ICD-10-CM | POA: Insufficient documentation

## 2020-06-25 DIAGNOSIS — I25119 Atherosclerotic heart disease of native coronary artery with unspecified angina pectoris: Secondary | ICD-10-CM

## 2020-06-25 DIAGNOSIS — I255 Ischemic cardiomyopathy: Secondary | ICD-10-CM | POA: Insufficient documentation

## 2020-06-25 DIAGNOSIS — I214 Non-ST elevation (NSTEMI) myocardial infarction: Secondary | ICD-10-CM | POA: Diagnosis present

## 2020-06-25 DIAGNOSIS — U071 COVID-19: Secondary | ICD-10-CM | POA: Diagnosis present

## 2020-06-25 MED ORDER — SODIUM CHLORIDE 0.9 % IV SOLN: INTRAVENOUS | Status: DC | PRN

## 2020-06-25 MED ORDER — METHYLPREDNISOLONE SODIUM SUCC 125 MG IJ SOLR: 125.0000 mg | Freq: Once | INTRAMUSCULAR | Status: DC | PRN

## 2020-06-25 MED ORDER — ALBUTEROL SULFATE HFA 108 (90 BASE) MCG/ACT IN AERS: 2.0000 | INHALATION_SPRAY | Freq: Once | RESPIRATORY_TRACT | Status: DC | PRN

## 2020-06-25 MED ORDER — FAMOTIDINE IN NACL 20-0.9 MG/50ML-% IV SOLN: 20.0000 mg | Freq: Once | INTRAVENOUS | Status: DC | PRN

## 2020-06-25 MED ORDER — EPINEPHRINE 0.3 MG/0.3ML IJ SOAJ: 0.3000 mg | Freq: Once | INTRAMUSCULAR | Status: DC | PRN

## 2020-06-25 MED ORDER — DIPHENHYDRAMINE HCL 50 MG/ML IJ SOLN: 50.0000 mg | Freq: Once | INTRAMUSCULAR | Status: DC | PRN

## 2020-06-25 MED ORDER — SODIUM CHLORIDE 0.9 % IV SOLN
1200.0000 mg | Freq: Once | INTRAVENOUS | Status: AC
  Administered 2020-06-25: 1200 mg via INTRAVENOUS

## 2020-06-25 NOTE — Progress Notes (Signed)
Pt is scheduled for infusion.

## 2020-06-25 NOTE — Discharge Instructions (Signed)

## 2020-06-25 NOTE — Progress Notes (Signed)
  Diagnosis: COVID-19  Physician: Wright, MD  Procedure: Covid Infusion Clinic Med: casirivimab\imdevimab infusion - Provided patient with casirivimab\imdevimab fact sheet for patients, parents and caregivers prior to infusion.  Complications: No immediate complications noted.  Discharge: Discharged home   Renetta Suman R Whitney Bingaman 06/25/2020   

## 2020-06-30 ENCOUNTER — Telehealth: Payer: Self-pay

## 2020-06-30 ENCOUNTER — Telehealth (HOSPITAL_COMMUNITY): Payer: Self-pay

## 2020-06-30 NOTE — Telephone Encounter (Signed)
Can put her on nurse schedule for swab for asymptomatic screening

## 2020-06-30 NOTE — Telephone Encounter (Signed)
Called and spoke with pt in regards to her COVID+ test. Pt tested + on 9/30, adv pt per RN nag. She would have to have a negative test result before coming in for CR orientation on 10/14. Patient verbalized understanding.

## 2020-06-30 NOTE — Telephone Encounter (Signed)
Sarah Kane states she needs to be tested for Covid before she goes to heart care rehab. Please advise.

## 2020-07-01 NOTE — Telephone Encounter (Signed)
Left the PT a VM to schedule. Did put them on the schedule for Monday at 2pm

## 2020-07-05 ENCOUNTER — Ambulatory Visit (INDEPENDENT_AMBULATORY_CARE_PROVIDER_SITE_OTHER): Payer: BC Managed Care – PPO | Admitting: Osteopathic Medicine

## 2020-07-05 ENCOUNTER — Telehealth (HOSPITAL_COMMUNITY): Payer: Self-pay | Admitting: Pharmacy Technician

## 2020-07-05 DIAGNOSIS — U071 COVID-19: Secondary | ICD-10-CM

## 2020-07-05 DIAGNOSIS — Z1152 Encounter for screening for COVID-19: Secondary | ICD-10-CM

## 2020-07-05 DIAGNOSIS — Z8616 Personal history of COVID-19: Secondary | ICD-10-CM

## 2020-07-05 NOTE — Progress Notes (Signed)
Patient drove by for Covid swab. She was recently diagnosed with Covid and needs clear swab to go back to Cardiac rehab. She denies any ongoing symptoms. Patient performed self swab with no apparent issues. Specimen sent to [facility] for processing.

## 2020-07-05 NOTE — Telephone Encounter (Signed)
Cardiac Rehab Medication Review by a Pharmacist  Does the patient feel that his/her medications are working for him/her?  yes  Has the patient been experiencing any side effects to the medications prescribed?  no  Does the patient measure his/her own blood pressure or blood glucose at home? no - patient has glucose monitor but does not use, does not own BP cuff  Does the patient have any problems obtaining medications due to transportation or finances?   No - Patient notes concern over being able to afford Brillinta in the future but notes past fill was $5 so is not a concern at this time.   Understanding of regimen: fair Understanding of indications: fair Potential of compliance: excellent    Pharmacist Intervention: N/A   Kinnie Feil, PharmD PGY1 Acute Care Pharmacy Resident Phone: [phone] 07/05/2020 5:34 PM  Please check [email or web address] for unit specific pharmacy phone numbers.

## 2020-07-06 ENCOUNTER — Telehealth (HOSPITAL_COMMUNITY): Payer: Self-pay

## 2020-07-06 NOTE — Telephone Encounter (Signed)
Cardiac Rehab Note:  Unsuccessful telephone call to [facility] to follow up on diagnosis of covid 06/22/20 and current symptoms. Of note, patient presented to her PCP yesterday requesting a follow up Covid test secondary to needing a negative result to attend cardiac rehab. Per EMR, swab obtained and awaiting results.She is scheduled for cardiac rehab orientation 07/08/20 which is far beyond her 10 day quarantine. [employer] compliant VM message left requesting call back to [phone].  Tait Balistreri E. Suzie Portela RN, BSN [place]  Cardiac and Pulmonary Rehabilitation Phone: [phone] Fax: [phone]

## 2020-07-07 ENCOUNTER — Telehealth: Payer: Self-pay | Admitting: Internal Medicine

## 2020-07-07 ENCOUNTER — Telehealth (HOSPITAL_COMMUNITY): Payer: Self-pay | Admitting: *Deleted

## 2020-07-07 LAB — NOVEL CORONAVIRUS, NAA: SARS-CoV-2, NAA: NOT DETECTED

## 2020-07-07 LAB — SARS-COV-2, NAA 2 DAY TAT

## 2020-07-07 NOTE — Telephone Encounter (Signed)
Note from cardiac rehab Patient with continued pain   Can she be put in schedule earlier than Dec?

## 2020-07-08 ENCOUNTER — Inpatient Hospital Stay (HOSPITAL_COMMUNITY): Admission: RE | Admit: 2020-07-08 | Payer: BC Managed Care – PPO | Source: Ambulatory Visit

## 2020-07-08 ENCOUNTER — Other Ambulatory Visit: Payer: Self-pay | Admitting: Osteopathic Medicine

## 2020-07-08 ENCOUNTER — Telehealth: Payer: Self-pay | Admitting: Internal Medicine

## 2020-07-08 NOTE — Progress Notes (Signed)
Cardiology Office Note   Date:  07/15/2020   ID:  LEIGHANNA KIRN, DOB 05/02/1971, MRN [id number]  PCP:  Sunnie Nielsen, DO  Cardiologist: Dr. Dietrich Pates, MD   Chief Complaint  Patient presents with  . Follow-up    History of Present Illness: Sarah Kane is a 49 y.o. female who presents for the evaluation of chest pain, seen for Dr. Tenny Craw.   [name] has a hx of HTN, DM2 and CAD. She had an NSTEMI 04/2020 at which time she underwent LHC that showed diffuse CAD with 30% pLAD, 99% dLAD, 70% prox LCx, 100% mRCA, 70% dRCA. She underwent PCI/DES to RCA with resolution of symptoms. Per chart review, staged PCI could be considered of the LCx if refractory symptoms. Echo at that time showed an LVEF at 45-50%. Losartan was held due to soft BPs. She was seen in follow up 06/03/20 with continued mild chest discomfort. Imdur was added to her regimen.     She was most recently seen by Dr. Tenny Craw 06/18/20 at which time she was not convinced that her pain was angina. Plan was to have her participate in cardiac rehab and evaluate her tolerance. Plan was to keep her out of work until after cardiac rehab complete.   Per Dr. Tenny Craw' note, she was having pain with CR. Phone message from 07/08/20 with persistent chest pain therefore it was recommended for further evaluation with OV.   Repeat call from 07/08/20 notes she continued to have daily chest discomfort since her NSTEMI 04/2020. She was taking medications as prescribed but remained concerned that the discomfort continues. She was unable to take SL NTG due to low BP.  She reported that this may be a chronic problem  She is noted to have significant LCx disease which was not stented on cath from 04/2020. She has been adequately prescribed long acting nitrates, currently taking 30mg  QD.   She presents today for follow up and reports that her symptoms have not changed despite the above interventions. 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 She states that since her stenting she continues to have chest pressure that begins in her left anterior chest and will sometimes radiate to her left breast and her back. Symptoms typically occur while sitting and watching TV in the evening. She doesn't know if her symptoms are due to anxiety or if they are "real" but she keeps stating that she just "doesn't feel right". She was contacted by cardiac rehab however was deferred to an office visit given the above symptoms.  She does not feel that her symptoms are exertional however is not nearly as active as she was prior to her MI.  Therefore unclear to determine.  She has no associated symptoms such as LE edema, shortness of breath, palpitations or dizziness.  Long discussion regarding next steps to proceed out of concern due to known LCx and LAD disease.  Will reach to primary cardiologist for further recommendations.  For now we will increase Imdur to 45 mg daily until final plan is determined.  She is leaning towards attempting cardiac rehab as she feels she was so active prior to her MI and being very inactive since may be making her symptoms worse.  Other option would be to proceed with LC H for stent intervention.   Past Medical History:  Diagnosis Date  . Arthritis   . Diabetes mellitus without  complication (HCC)   . Hypertension     Past Surgical History:  Procedure Laterality Date  . CESAREAN SECTION  1990  . CORONARY STENT INTERVENTION N/A 05/20/2020   Procedure: CORONARY STENT INTERVENTION;  Surgeon: Kathleene Hazel, MD;  Location: [facility] INVASIVE CV LAB;  Service: Cardiovascular;  Laterality: N/A;  . LEFT HEART CATH AND CORONARY ANGIOGRAPHY N/A 05/20/2020   Procedure: LEFT HEART CATH AND CORONARY ANGIOGRAPHY;  Surgeon: Kathleene Hazel, MD;  Location: [facility] INVASIVE CV LAB;  Service: Cardiovascular;  Laterality: N/A;  . TRIGGER FINGER RELEASE Right    thumb      Current Outpatient Medications  Medication Sig Dispense Refill  . acetaminophen (TYLENOL) 500 MG tablet Take 500 mg by mouth every 6 (six) hours as needed for mild pain or headache.     [name] aspirin 81 MG EC tablet Take 1 tablet (81 mg total) by mouth daily. Swallow whole. 30 tablet 11  . atorvastatin (LIPITOR) 80 MG tablet Take 1 tablet (80 mg total) by mouth daily. 90 tablet 3  . escitalopram (LEXAPRO) 10 MG tablet Take 10 mg by mouth daily.     [name] gabapentin (NEURONTIN) 300 MG capsule TAKE 1 CAPSULE BY MOUTH THREE TIMES DAILY 270 capsule 3  . glimepiride (AMARYL) 4 MG tablet TAKE 2 TABLETS BY MOUTH ONCE DAILY WITH BREAKFAST (Patient taking differently: Take 8 mg by mouth daily with breakfast. ) 180 tablet 1  . isosorbide mononitrate (IMDUR) 30 MG 24 hr tablet Take 1.5 tablets (45 mg total) by mouth daily. 135 tablet 3  . metoprolol succinate (TOPROL-XL) 25 MG 24 hr tablet Take 1 tablet (25 mg total) by mouth 2 (two) times daily with a meal. 180 tablet 3  . Multiple Vitamins-Minerals (MULTIVITAMIN WOMEN PO) Take 1 tablet by mouth daily.    . nitroGLYCERIN (NITROSTAT) 0.4 MG SL tablet Place 1 tablet (0.4 mg total) under the tongue every 5 (five) minutes x 3 doses as needed for chest pain. 25 tablet 6  . OZEMPIC, 1 MG/DOSE, 4 MG/3ML SOPN Inject 1 mg into the skin once a week.    . sitaGLIPtin (JANUVIA) 100 MG tablet Take 1 tablet (100 mg total) by mouth daily. 90 tablet 1  . ticagrelor (BRILINTA) 90 MG TABS tablet Take 1 tablet (90 mg total) by mouth 2 (two) times daily. 60 tablet 11  . Vitamin D, Ergocalciferol, (DRISDOL) 1.25 MG (50000 UNIT) CAPS capsule Take 1 capsule by mouth once a week 12 capsule 3   No current facility-administered medications for this visit.    Allergies:   Patient has no known allergies.    Social History:  The patient  reports that she has never smoked. She has never used smokeless tobacco. She reports that she does not drink alcohol and does not use drugs.    Family History:  The patient's family history includes Diabetes in her maternal aunt, maternal grandfather, maternal grandmother, maternal uncle, and mother; Heart attack in her maternal grandfather; High blood pressure in her maternal grandfather, maternal grandmother, mother, paternal grandfather, and paternal grandmother; Skin cancer in her maternal uncle.    ROS:  Please see the history of present illness.  Otherwise, review of systems are positive for none.   All other systems are reviewed and negative.    PHYSICAL EXAM: VS:  BP (!) 98/58   Pulse 82   Ht 5' (1.524 m)   Wt 174 lb 6.4 oz (79.1 kg)   SpO2 96%   BMI 34.06 kg/m  ,  BMI Body mass index is 34.06 kg/m.   General: Well developed, well nourished, NAD Lungs:Clear to ausculation bilaterally.  Cardiovascular: RRR with S1 S2. No murmur Extremities: No edema.  Radial pulses 2+ bilaterally Neuro: Alert and oriented. No focal deficits. No facial asymmetry. MAE spontaneously. Psych: Responds to questions appropriately with normal affect.     EKG:  EKG is ordered today. The ekg ordered today demonstrates NSR with no change from prior tracing, HR 80bpm    Recent Labs: 08/20/2019: ALT 44 05/20/2020: TSH 1.494 06/18/2020: BUN 18; Creatinine, Ser 0.90; Hemoglobin 16.2; Platelets 221; Potassium 4.8; Sodium 138    Lipid Panel    Component Value Date/Time   CHOL 135 06/18/2020 1226   TRIG 118 06/18/2020 1226   HDL 49 06/18/2020 1226   CHOLHDL 4.1 05/21/2020 0438   VLDL 30 05/21/2020 0438   LDLCALC 65 06/18/2020 1226     Wt Readings from Last 3 Encounters:  07/15/20 174 lb 6.4 oz (79.1 kg)  06/22/20 173 lb (78.5 kg)  06/18/20 173 lb 12.8 oz (78.8 kg)    Other studies Reviewed: Additional studies/ records that were reviewed today include: . Review of the above records demonstrates:    Left heart catheterization 05/20/20:  Prox RCA lesion is 30% stenosed.  Mid RCA lesion is 100% stenosed.  Prox Cx to Mid Cx  lesion is 70% stenosed.  Dist LAD lesion is 99% stenosed.  Prox LAD to Dist LAD lesion is 30% stenosed.  A drug-eluting stent was successfully placed using a SYNERGY XD 2.75X20.  Post intervention, there is a 0% residual stenosis.  A drug-eluting stent was successfully placed using a SYNERGY XD 2.25X12.  Dist RCA-1 lesion is 70% stenosed.  Post intervention, there is a 0% residual stenosis.  Dist RCA-2 lesion is 70% stenosed with 70% stenosed side branch in RPAV.  1. Acute inferior MT/NSTEMI secondary to occlusion of the mid RCA. This is a dominant vessel.  2. Successful PTCA/DES x 2 mid and distal RCA 3. The LAD is a moderate caliber vessel that courses to the apex. The distal vessel is diabetic appearing, small in caliber and diffusely disease. Not a target for PCI.  4. 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   Recommendations: Pt pain free post PCI. Will continue Aggrastat for 2 hours post PCI. Continue DAPT with ASA and Brilinta for one year. Echo in am. Continue high intensity statin. Staged PCI of the Circumflex possibly tomorrow. Her distal vessel are diffusely diseased with diabetic appearance and are not targets for PCI. Diagnostic Dominance: Right  Intervention     Echocardiogram 05/21/20: 1. Left ventricular ejection fraction, by estimation, is 45 to 50%. The  left ventricle has mildly decreased function. The left ventricle  demonstrates regional wall motion abnormalities (see scoring  diagram/findings for description). Left ventricular  diastolic parameters are consistent with Grade I diastolic dysfunction  (impaired relaxation).  2. Right ventricular systolic function is normal. The right ventricular  size is normal.  3. The mitral valve is normal in structure. No evidence of mitral valve  regurgitation. No evidence of mitral stenosis.  4. The aortic valve is normal in structure. Aortic valve regurgitation is  not visualized. No aortic stenosis  is present.  5. The inferior vena cava is normal in size with greater than 50%  respiratory variability, suggesting right atrial pressure of 3 mmHg.   ASSESSMENT AND PLAN:  1. Chest pain with know CAD s/p recent PCI and known residual LCx disease: -Underwent LHC  with PCI to RCA 04/2020>>placed on ASA and Brilinta -See in f/u with persistent, mild symptoms at which time it was recommended that she participate with cardiac rehab (Imdur added).  Contacted by cardiac rehab and reports that since her stenting she continues to have chest pressure that begins in her left anterior chest and will sometimes radiate to her left breast and her back. Symptoms typically occur while sitting and watching TV in the evening. She doesn't know if her symptoms are due to anxiety or if they are "real" but she keeps stating that she just "doesn't feel right" -CR deferred her participation until further evaluation.  A -We will forward note to primary cardiologist for further review.  In the meantime, increase Imdur to 45 mg daily.  Patient is leaning towards wanting to participate in cardiac rehab to assess for symptom escalation however will leave that decision up to Dr. Lucina Mellow. -Has known 99% residual dz in LCx and 70% LAD d/s with ongoing refractory pain despite long acting antianginals  2. HLD: -LDL, 65>>>at goal of <70 -Continue high intensity statin   3. DM2: -Uncontrolled, 9.3 in 03/2019 -Currently on Januvia  -Needs close follow up with PCP   4. HTN:  -Low normal, 98/68>>> asymptomatic -Continue current regimen however needs to monitor blood pressure at home -Reports that she has not eaten or drinking today   Current medicines are reviewed at length with the patient today.  The patient does not have concerns regarding medicines.  The following changes have been made:  Increase IMDUR to 45mg  PO QD  Labs/ tests ordered today include: None   Orders Placed This Encounter  Procedures  . EKG 12-Lead     Disposition:   FU with Dr. or APP in 3 weeks  Signed, Tenny Craw, NP  07/15/2020 12:09 PM    [facility] [address] [place], [place], [place]  [place] Phone: [phone]; Fax: [phone]

## 2020-07-08 NOTE — Telephone Encounter (Signed)
Pt c/o of Chest Pain: STAT if CP now or developed within 24 hours  1. Are you having CP right now?  Yes-   2. Are you experiencing any other symptoms (ex. SOB, nausea, vomiting, sweating)?  No  3. How long have you been experiencing CP?  Been  Going on for months off and on  4. Is your CP continuous or coming and going? Comes and goes  5. Have you taken Nitroglycerin? no ?

## 2020-07-08 NOTE — Telephone Encounter (Signed)
Spoke with pt who reports she continues to have daily intermittent chest discomfort since her NSTEMI 04/2020.  Pt reports she is taking medications as prescribed but remains concerned that the discomfort continues.  Pt was unable to take nitro SL due to it dropping her B/P.  Pt has appointment with Georgie Chard, NP 07/15/2020.  Pt advised for active CP the recommendation is to go to ED for further evaluation.  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 Pt states she will wait for appointment with NP as this is now seems to be a chronic problem.  Reviewed ED protocol with pt who verbalizes understanding.

## 2020-07-08 NOTE — H&P (View-Only) (Signed)
Cardiology Office Note   Date:  07/15/2020   ID:  Sarah Kane, DOB 05/02/1971, MRN [id number]  PCP:  Sarah Nielsen, DO  Cardiologist: Dr. Dietrich Pates, MD   Chief Complaint  Patient presents with  . Follow-up    History of Present Illness: Sarah Kane is a 49 y.o. female who presents for the evaluation of chest pain, seen for Dr. Tenny Kane.   Sarah Kane has a hx of HTN, DM2 and CAD. She had an NSTEMI 04/2020 at which time she underwent LHC that showed diffuse CAD with 30% pLAD, 99% dLAD, 70% prox LCx, 100% mRCA, 70% dRCA. She underwent PCI/DES to RCA with resolution of symptoms. Per chart review, staged PCI could be considered of the LCx if refractory symptoms. Echo at that time showed an LVEF at 45-50%. Losartan was held due to soft BPs. She was seen in follow up 06/03/20 with continued mild chest discomfort. Imdur was added to her regimen.     She was most recently seen by Dr. Tenny Kane 06/18/20 at which time she was not convinced that her pain was angina. Plan was to have her participate in cardiac rehab and evaluate her tolerance. Plan was to keep her out of work until after cardiac rehab complete.   Per Dr. Tenny Kane' note, she was having pain with CR. Phone message from 07/08/20 with persistent chest pain therefore it was recommended for further evaluation with OV.   Repeat call from 07/08/20 notes she continued to have daily chest discomfort since her NSTEMI 04/2020. She was taking medications as prescribed but remained concerned that the discomfort continues. She was unable to take SL NTG due to low BP.  She reported that this may be a chronic problem  She is noted to have significant LCx disease which was not stented on cath from 04/2020. She has been adequately prescribed long acting nitrates, currently taking 30mg  QD.   She presents today for follow up and reports that her symptoms have not changed despite the above interventions. 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 She states that since her stenting she continues to have chest pressure that begins in her left anterior chest and will sometimes radiate to her left breast and her back. Symptoms typically occur while sitting and watching TV in the evening. She doesn't know if her symptoms are due to anxiety or if they are "real" but she keeps stating that she just "doesn't feel right". She was contacted by cardiac rehab however was deferred to an office visit given the above symptoms.  She does not feel that her symptoms are exertional however is not nearly as active as she was prior to her MI.  Therefore unclear to determine.  She has no associated symptoms such as LE edema, shortness of breath, palpitations or dizziness.  Long discussion regarding next steps to proceed out of concern due to known LCx and LAD disease.  Will reach to primary cardiologist for further recommendations.  For now we will increase Imdur to 45 mg daily until final plan is determined.  She is leaning towards attempting cardiac rehab as she feels she was so active prior to her MI and being very inactive since may be making her symptoms worse.  Other option would be to proceed with LC H for stent intervention.   Past Medical History:  Diagnosis Date  . Arthritis   . Diabetes mellitus without  complication (HCC)   . Hypertension     Past Surgical History:  Procedure Laterality Date  . CESAREAN SECTION  1990  . CORONARY STENT INTERVENTION N/A 05/20/2020   Procedure: CORONARY STENT INTERVENTION;  Surgeon: Kathleene Hazel, MD;  Location: [facility];  Service: Cardiovascular;  Laterality: N/A;  . LEFT HEART CATH AND CORONARY ANGIOGRAPHY N/A 05/20/2020   Procedure: LEFT HEART CATH AND CORONARY ANGIOGRAPHY;  Surgeon: Kathleene Hazel, MD;  Location: [facility];  Service: Cardiovascular;  Laterality: N/A;  . TRIGGER FINGER RELEASE Right    thumb      Current Outpatient Medications  Medication Sig Dispense Refill  . acetaminophen (TYLENOL) 500 MG tablet Take 500 mg by mouth every 6 (six) hours as needed for mild pain or headache.     [name] aspirin 81 MG EC tablet Take 1 tablet (81 mg total) by mouth daily. Swallow whole. 30 tablet 11  . atorvastatin (LIPITOR) 80 MG tablet Take 1 tablet (80 mg total) by mouth daily. 90 tablet 3  . escitalopram (LEXAPRO) 10 MG tablet Take 10 mg by mouth daily.     [name] gabapentin (NEURONTIN) 300 MG capsule TAKE 1 CAPSULE BY MOUTH THREE TIMES DAILY 270 capsule 3  . glimepiride (AMARYL) 4 MG tablet TAKE 2 TABLETS BY MOUTH ONCE DAILY WITH BREAKFAST (Patient taking differently: Take 8 mg by mouth daily with breakfast. ) 180 tablet 1  . isosorbide mononitrate (IMDUR) 30 MG 24 hr tablet Take 1.5 tablets (45 mg total) by mouth daily. 135 tablet 3  . metoprolol succinate (TOPROL-XL) 25 MG 24 hr tablet Take 1 tablet (25 mg total) by mouth 2 (two) times daily with a meal. 180 tablet 3  . Multiple Vitamins-Minerals (MULTIVITAMIN WOMEN PO) Take 1 tablet by mouth daily.    . nitroGLYCERIN (NITROSTAT) 0.4 MG SL tablet Place 1 tablet (0.4 mg total) under the tongue every 5 (five) minutes x 3 doses as needed for chest pain. 25 tablet 6  . OZEMPIC, 1 MG/DOSE, 4 MG/3ML SOPN Inject 1 mg into the skin once a week.    . sitaGLIPtin (JANUVIA) 100 MG tablet Take 1 tablet (100 mg total) by mouth daily. 90 tablet 1  . ticagrelor (BRILINTA) 90 MG TABS tablet Take 1 tablet (90 mg total) by mouth 2 (two) times daily. 60 tablet 11  . Vitamin D, Ergocalciferol, (DRISDOL) 1.25 MG (50000 UNIT) CAPS capsule Take 1 capsule by mouth once a week 12 capsule 3   No current facility-administered medications for this visit.    Allergies:   Patient has no known allergies.    Social History:  The patient  reports that she has never smoked. She has never used smokeless tobacco. She reports that she does not drink alcohol and does not use drugs.    Family History:  The patient's family history includes Diabetes in her maternal aunt, maternal grandfather, maternal grandmother, maternal uncle, and mother; Heart attack in her maternal grandfather; High blood pressure in her maternal grandfather, maternal grandmother, mother, paternal grandfather, and paternal grandmother; Skin cancer in her maternal uncle.    ROS:  Please see the history of present illness.  Otherwise, review of systems are positive for none.   All other systems are reviewed and negative.    PHYSICAL EXAM: VS:  BP (!) 98/58   Pulse 82   Ht 5' (1.524 m)   Wt 174 lb 6.4 oz (79.1 kg)   SpO2 96%   BMI 34.06 kg/m  ,  BMI Body mass index is 34.06 kg/m.   General: Well developed, well nourished, NAD Lungs:Clear to ausculation bilaterally.  Cardiovascular: RRR with S1 S2. No murmur Extremities: No edema.  Radial pulses 2+ bilaterally Neuro: Alert and oriented. No focal deficits. No facial asymmetry. MAE spontaneously. Psych: Responds to questions appropriately with normal affect.     EKG:  EKG is ordered today. The ekg ordered today demonstrates NSR with no change from prior tracing, HR 80bpm    Recent Labs: 08/20/2019: ALT 44 05/20/2020: TSH 1.494 06/18/2020: BUN 18; Creatinine, Ser 0.90; Hemoglobin 16.2; Platelets 221; Potassium 4.8; Sodium 138    Lipid Panel    Component Value Date/Time   CHOL 135 06/18/2020 1226   TRIG 118 06/18/2020 1226   HDL 49 06/18/2020 1226   CHOLHDL 4.1 05/21/2020 0438   VLDL 30 05/21/2020 0438   LDLCALC 65 06/18/2020 1226     Wt Readings from Last 3 Encounters:  07/15/20 174 lb 6.4 oz (79.1 kg)  06/22/20 173 lb (78.5 kg)  06/18/20 173 lb 12.8 oz (78.8 kg)    Other studies Reviewed: Additional studies/ records that were reviewed today include: . Review of the above records demonstrates:    Left heart catheterization 05/20/20:  Prox RCA lesion is 30% stenosed.  Mid RCA lesion is 100% stenosed.  Prox Cx to Mid Cx  lesion is 70% stenosed.  Dist LAD lesion is 99% stenosed.  Prox LAD to Dist LAD lesion is 30% stenosed.  A drug-eluting stent was successfully placed using a SYNERGY XD 2.75X20.  Post intervention, there is a 0% residual stenosis.  A drug-eluting stent was successfully placed using a SYNERGY XD 2.25X12.  Dist RCA-1 lesion is 70% stenosed.  Post intervention, there is a 0% residual stenosis.  Dist RCA-2 lesion is 70% stenosed with 70% stenosed side branch in RPAV.  1. Acute inferior MT/NSTEMI secondary to occlusion of the mid RCA. This is a dominant vessel.  2. Successful PTCA/DES x 2 mid and distal RCA 3. The LAD is a moderate caliber vessel that courses to the apex. The distal vessel is diabetic appearing, small in caliber and diffusely disease. Not a target for PCI.  4. 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   Recommendations: Pt pain free post PCI. Will continue Aggrastat for 2 hours post PCI. Continue DAPT with ASA and Brilinta for one year. Echo in am. Continue high intensity statin. Staged PCI of the Circumflex possibly tomorrow. Her distal vessel are diffusely diseased with diabetic appearance and are not targets for PCI. Diagnostic Dominance: Right  Intervention     Echocardiogram 05/21/20: 1. Left ventricular ejection fraction, by estimation, is 45 to 50%. The  left ventricle has mildly decreased function. The left ventricle  demonstrates regional wall motion abnormalities (see scoring  diagram/findings for description). Left ventricular  diastolic parameters are consistent with Grade I diastolic dysfunction  (impaired relaxation).  2. Right ventricular systolic function is normal. The right ventricular  size is normal.  3. The mitral valve is normal in structure. No evidence of mitral valve  regurgitation. No evidence of mitral stenosis.  4. The aortic valve is normal in structure. Aortic valve regurgitation is  not visualized. No aortic stenosis  is present.  5. The inferior vena cava is normal in size with greater than 50%  respiratory variability, suggesting right atrial pressure of 3 mmHg.   ASSESSMENT AND PLAN:  1. Chest pain with know CAD s/p recent PCI and known residual LCx disease: -Underwent LHC   with PCI to RCA 04/2020>>placed on ASA and Brilinta -See in f/u with persistent, mild symptoms at which time it was recommended that she participate with cardiac rehab (Imdur added).  Contacted by cardiac rehab and reports that since her stenting she continues to have chest pressure that begins in her left anterior chest and will sometimes radiate to her left breast and her back. Symptoms typically occur while sitting and watching TV in the evening. She doesn't know if her symptoms are due to anxiety or if they are "real" but she keeps stating that she just "doesn't feel right" -CR deferred her participation until further evaluation.  A -We will forward note to primary cardiologist for further review.  In the meantime, increase Imdur to 45 mg daily.  Patient is leaning towards wanting to participate in cardiac rehab to assess for symptom escalation however will leave that decision up to Dr. Lucina Mellow. -Has known 99% residual dz in LCx and 70% LAD d/s with ongoing refractory pain despite long acting antianginals  2. HLD: -LDL, 65>>>at goal of <70 -Continue high intensity statin   3. DM2: -Uncontrolled, 9.3 in 03/2019 -Currently on Januvia  -Needs close follow up with PCP   4. HTN:  -Low normal, 98/68>>> asymptomatic -Continue current regimen however needs to monitor blood pressure at home -Reports that she has not eaten or drinking today   Current medicines are reviewed at length with the patient today.  The patient does not have concerns regarding medicines.  The following changes have been made:  Increase IMDUR to 45mg  PO QD  Labs/ tests ordered today include: None   Orders Placed This Encounter  Procedures  . EKG 12-Lead     Disposition:   FU with Dr. or APP in 3 weeks  Signed, Sarah Craw, NP  07/15/2020 12:09 PM    [facility] [address] [place], [place], [place]  [place] Phone: [phone]; Fax: [phone]

## 2020-07-08 NOTE — Telephone Encounter (Signed)
Patient has been scheduled next Thurs with APP.

## 2020-07-09 ENCOUNTER — Other Ambulatory Visit: Payer: Self-pay | Admitting: Neurology

## 2020-07-09 NOTE — Telephone Encounter (Signed)
RX faxed to pharmacy.

## 2020-07-09 NOTE — Telephone Encounter (Signed)
RX also faxed to pharmacy.

## 2020-07-12 ENCOUNTER — Ambulatory Visit (HOSPITAL_COMMUNITY): Payer: BC Managed Care – PPO

## 2020-07-14 ENCOUNTER — Ambulatory Visit (HOSPITAL_COMMUNITY): Payer: BC Managed Care – PPO

## 2020-07-15 ENCOUNTER — Other Ambulatory Visit: Payer: Self-pay

## 2020-07-15 ENCOUNTER — Ambulatory Visit (INDEPENDENT_AMBULATORY_CARE_PROVIDER_SITE_OTHER): Payer: BC Managed Care – PPO | Admitting: Cardiology

## 2020-07-15 ENCOUNTER — Encounter: Payer: Self-pay | Admitting: Cardiology

## 2020-07-15 ENCOUNTER — Other Ambulatory Visit: Payer: BC Managed Care – PPO

## 2020-07-15 VITALS — BP 98/58 | HR 82 | Ht 60.0 in | Wt 174.4 lb

## 2020-07-15 DIAGNOSIS — I10 Essential (primary) hypertension: Secondary | ICD-10-CM

## 2020-07-15 DIAGNOSIS — E1165 Type 2 diabetes mellitus with hyperglycemia: Secondary | ICD-10-CM

## 2020-07-15 DIAGNOSIS — I25119 Atherosclerotic heart disease of native coronary artery with unspecified angina pectoris: Secondary | ICD-10-CM

## 2020-07-15 DIAGNOSIS — E785 Hyperlipidemia, unspecified: Secondary | ICD-10-CM | POA: Diagnosis not present

## 2020-07-15 DIAGNOSIS — I255 Ischemic cardiomyopathy: Secondary | ICD-10-CM

## 2020-07-15 MED ORDER — ISOSORBIDE MONONITRATE ER 30 MG PO TB24
45.0000 mg | ORAL_TABLET | Freq: Every day | ORAL | 3 refills | Status: DC
Start: 2020-07-15 — End: 2021-05-18

## 2020-07-15 NOTE — Progress Notes (Signed)
[name], I saw [name] and now Sarah Kane has seen her   She is still having angina   BP is marginal The LCx was not intervened on when you did the cath in August.   Thoughts?  Gunnar Fusi

## 2020-07-15 NOTE — Progress Notes (Signed)
Set pt up to have PCI of LCx with C McALhany   WIll needs precath labs

## 2020-07-15 NOTE — Patient Instructions (Addendum)
Medication Instructions:    1. We are increasing the isosorbide (Imdur) to 45 mg by mouth daily. You will take one and one-half tablets by mouth daily.   *If you need a refill on your cardiac medications before your next appointment, please call your pharmacy*   Lab Work: None If you have labs (blood work) drawn today and your tests are completely normal, you will receive your results only by: [name] MyChart Message (if you have MyChart) OR . A paper copy in the mail If you have any lab test that is abnormal or we need to change your treatment, we will call you to review the results.   Testing/Procedures: None   Follow-Up: At [facility], you and your health needs are our priority.  As part of our continuing mission to provide you with exceptional heart care, we have created designated Provider Care Teams.  These Care Teams include your primary Cardiologist (physician) and Advanced Practice Providers (APPs -  Physician Assistants and Nurse Practitioners) who all work together to provide you with the care you need, when you need it.   You are scheduled to see Georgie Chard for a 3 week follow up on 08/10/20 at 9:45 AM.

## 2020-07-16 ENCOUNTER — Ambulatory Visit (HOSPITAL_COMMUNITY): Payer: BC Managed Care – PPO

## 2020-07-16 ENCOUNTER — Encounter: Payer: Self-pay | Admitting: *Deleted

## 2020-07-16 ENCOUNTER — Telehealth: Payer: Self-pay | Admitting: *Deleted

## 2020-07-16 DIAGNOSIS — Z01812 Encounter for preprocedural laboratory examination: Secondary | ICD-10-CM

## 2020-07-16 NOTE — Telephone Encounter (Signed)
Received message from Dr. Tenny Craw:   Set pt up to have PCI of LCx with C McALhany   WIll needs precath labs   Reviewed w J. Julien Girt, I contacted patient.  Arranged procedure for 07/22/20 at 10:30 am with Dr. Clifton James.  Pt will have Covid screen and labs at [facility] on 10/26.  EKG and H&P up to date.   All instructions through MyChart.

## 2020-07-19 ENCOUNTER — Ambulatory Visit (HOSPITAL_COMMUNITY): Payer: BC Managed Care – PPO

## 2020-07-19 NOTE — Progress Notes (Signed)
Patient had a positive COVID test 06/22/20 no need for retest for 90 days from the positive COVID result.  Patient does not need to be retested for procedure on 07/22/20

## 2020-07-20 ENCOUNTER — Other Ambulatory Visit (HOSPITAL_COMMUNITY): Payer: BC Managed Care – PPO

## 2020-07-21 ENCOUNTER — Telehealth: Payer: Self-pay | Admitting: *Deleted

## 2020-07-21 ENCOUNTER — Ambulatory Visit (HOSPITAL_COMMUNITY): Payer: BC Managed Care – PPO

## 2020-07-21 ENCOUNTER — Encounter: Payer: Self-pay | Admitting: Physician Assistant

## 2020-07-21 ENCOUNTER — Encounter: Payer: Self-pay | Admitting: Osteopathic Medicine

## 2020-07-21 NOTE — Telephone Encounter (Signed)
Left VM for patient  Did not answer REviewed intervention and the fact that she had a residual narrowing  Discussion with C McALhany would recomm cath with intervention The pt was recently seen by Arelia Longest  Still having chest pressure.   Based on that recomm scheduling for PTCA I will try calling pt again later today

## 2020-07-21 NOTE — Telephone Encounter (Addendum)
Pt contacted pre-coronary stent intervention scheduled at [facility] for: Thursday July 22, 2020 10:30 AM Verified arrival time and place: [facility]) at: 8 AM-needs lab BMP/CBC   No solid food after midnight prior to cath, clear liquids until 5 AM day of procedure.  Hold: Glimepiride -AM of procedure Januvia-AM of procedure  Except hold medications AM meds can be  taken pre-cath with sips of water including: ASA 81 mg Brilinta 90 mg  Confirmed patient has responsible adult to drive home post procedure and be with patient first 24 hours after arriving home: * see notes below.   You are allowed ONE visitor in the waiting room during the time you are at the hospital for your procedure. Both you and your visitor must wear a mask once you enter the hospital.  Patient COVID-19 positive 06/22/20, no need for retest 90 days from positive result.  *Pt has someone to stay with after discharge if same day discharge, she will make arrangements today for transportation to and from hospital. She knows she will not be able to drive home if same day discharge.     Reviewed procedure/mask/visitor instructions with patient.  Pt requested to talk with Dr Tenny Craw before procedure. I spoke with Dr Tenny Craw and she will plan to call patient today.

## 2020-07-22 ENCOUNTER — Ambulatory Visit (HOSPITAL_COMMUNITY)
Admission: RE | Disposition: A | Discharge status: Home / Self Care | Payer: Self-pay | Source: Home / Self Care | Attending: Cardiovascular Disease

## 2020-07-22 ENCOUNTER — Ambulatory Visit (HOSPITAL_COMMUNITY)
Admission: RE | Admit: 2020-07-22 | Discharge: 2020-07-22 | Disposition: A | Discharge status: Home / Self Care | Payer: BC Managed Care – PPO | Attending: Cardiovascular Disease | Admitting: Cardiovascular Disease

## 2020-07-22 ENCOUNTER — Telehealth: Payer: BC Managed Care – PPO | Admitting: Nurse Practitioner

## 2020-07-22 ENCOUNTER — Other Ambulatory Visit: Payer: Self-pay

## 2020-07-22 DIAGNOSIS — I25110 Atherosclerotic heart disease of native coronary artery with unstable angina pectoris: Secondary | ICD-10-CM | POA: Diagnosis not present

## 2020-07-22 DIAGNOSIS — Z8249 Family history of ischemic heart disease and other diseases of the circulatory system: Secondary | ICD-10-CM | POA: Diagnosis not present

## 2020-07-22 DIAGNOSIS — I10 Essential (primary) hypertension: Secondary | ICD-10-CM | POA: Insufficient documentation

## 2020-07-22 DIAGNOSIS — E119 Type 2 diabetes mellitus without complications: Secondary | ICD-10-CM | POA: Diagnosis not present

## 2020-07-22 DIAGNOSIS — E785 Hyperlipidemia, unspecified: Secondary | ICD-10-CM | POA: Diagnosis not present

## 2020-07-22 DIAGNOSIS — Z955 Presence of coronary angioplasty implant and graft: Secondary | ICD-10-CM

## 2020-07-22 DIAGNOSIS — Z833 Family history of diabetes mellitus: Secondary | ICD-10-CM | POA: Diagnosis not present

## 2020-07-22 DIAGNOSIS — I200 Unstable angina: Secondary | ICD-10-CM

## 2020-07-22 HISTORY — PX: CORONARY STENT INTERVENTION: CATH118234

## 2020-07-22 LAB — BASIC METABOLIC PANEL
Anion gap: 12 (ref 5–15)
BUN: 14 mg/dL (ref 6–20)
CO2: 23 mmol/L (ref 22–32)
Calcium: 9.3 mg/dL (ref 8.9–10.3)
Chloride: 103 mmol/L (ref 98–111)
Creatinine, Ser: 0.86 mg/dL (ref 0.44–1.00)
GFR, Estimated: >60 mL/min (ref >60)
Glucose, Bld: 130 mg/dL — ABNORMAL HIGH (ref 70–99)
Potassium: 5.2 mmol/L — ABNORMAL HIGH (ref 3.5–5.1)
Sodium: 138 mmol/L (ref 135–145)

## 2020-07-22 LAB — GLUCOSE, CAPILLARY
Glucose-Capillary: 119 mg/dL — ABNORMAL HIGH (ref 70–99)
Glucose-Capillary: 106 mg/dL — ABNORMAL HIGH (ref 70–99)

## 2020-07-22 LAB — CBC
HCT: 44.9 % (ref 36.0–46.0)
Hemoglobin: 14.5 g/dL (ref 12.0–15.0)
MCH: 28.5 pg (ref 26.0–34.0)
MCHC: 32.3 g/dL (ref 30.0–36.0)
MCV: 88.2 fL (ref 80.0–100.0)
Platelets: 170 10*3/uL (ref 150–400)
RBC: 5.09 MIL/uL (ref 3.87–5.11)
RDW: 13.2 % (ref 11.5–15.5)
WBC: 8.9 10*3/uL (ref 4.0–10.5)
nRBC: 0 % (ref 0.0–0.2)

## 2020-07-22 LAB — POCT ACTIVATED CLOTTING TIME: Activated Clotting Time: 599 seconds

## 2020-07-22 SURGERY — CORONARY STENT INTERVENTION
Anesthesia: LOCAL

## 2020-07-22 MED ORDER — NITROGLYCERIN 1 MG/10 ML FOR IR/CATH LAB
INTRA_ARTERIAL | Status: AC
  Filled 2020-07-22: qty 10

## 2020-07-22 MED ORDER — HEPARIN SODIUM (PORCINE) 1000 UNIT/ML IJ SOLN
INTRAMUSCULAR | Status: AC
  Filled 2020-07-22: qty 1

## 2020-07-22 MED ORDER — SODIUM CHLORIDE 0.9% FLUSH: 3.0000 mL | Freq: Two times a day (BID) | INTRAVENOUS | Status: DC

## 2020-07-22 MED ORDER — LIDOCAINE HCL (PF) 1 % IJ SOLN
INTRAMUSCULAR | Status: DC | PRN
  Administered 2020-07-22: 2 mL via SUBCUTANEOUS

## 2020-07-22 MED ORDER — ASPIRIN 81 MG PO CHEW: 81.0000 mg | CHEWABLE_TABLET | ORAL | Status: DC

## 2020-07-22 MED ORDER — VERAPAMIL HCL 2.5 MG/ML IV SOLN
INTRAVENOUS | Status: AC
  Filled 2020-07-22: qty 2

## 2020-07-22 MED ORDER — ONDANSETRON HCL 4 MG/2ML IJ SOLN
INTRAMUSCULAR | Status: AC
  Filled 2020-07-22: qty 2

## 2020-07-22 MED ORDER — IOHEXOL 350 MG/ML SOLN
INTRAVENOUS | Status: AC
  Filled 2020-07-22: qty 1

## 2020-07-22 MED ORDER — SODIUM CHLORIDE 0.9 % WEIGHT BASED INFUSION: 1.0000 mL/kg/h | INTRAVENOUS | Status: DC

## 2020-07-22 MED ORDER — SODIUM CHLORIDE 0.9 % IV SOLN: 250.0000 mL | INTRAVENOUS | Status: DC | PRN

## 2020-07-22 MED ORDER — TICAGRELOR 90 MG PO TABS: 90.0000 mg | ORAL_TABLET | ORAL | Status: DC

## 2020-07-22 MED ORDER — HEPARIN (PORCINE) IN NACL 1000-0.9 UT/500ML-% IV SOLN
INTRAVENOUS | Status: DC | PRN
  Administered 2020-07-22 (×2): 500 mL

## 2020-07-22 MED ORDER — ONDANSETRON HCL 4 MG/2ML IJ SOLN: 4.0000 mg | Freq: Four times a day (QID) | INTRAMUSCULAR | Status: DC | PRN

## 2020-07-22 MED ORDER — ACETAMINOPHEN 325 MG PO TABS: 650.0000 mg | ORAL_TABLET | ORAL | Status: DC | PRN

## 2020-07-22 MED ORDER — MIDAZOLAM HCL 2 MG/2ML IJ SOLN
INTRAMUSCULAR | Status: DC | PRN
  Administered 2020-07-22: 1 mg via INTRAVENOUS

## 2020-07-22 MED ORDER — HEPARIN SODIUM (PORCINE) 1000 UNIT/ML IJ SOLN
INTRAMUSCULAR | Status: DC | PRN
  Administered 2020-07-22: 10000 [IU] via INTRAVENOUS

## 2020-07-22 MED ORDER — ONDANSETRON HCL 4 MG/2ML IJ SOLN
INTRAMUSCULAR | Status: DC | PRN
  Administered 2020-07-22: 4 mg via INTRAVENOUS

## 2020-07-22 MED ORDER — ANGIOPLASTY BOOK
Status: AC
  Filled 2020-07-22: qty 1

## 2020-07-22 MED ORDER — HEPARIN (PORCINE) IN NACL 1000-0.9 UT/500ML-% IV SOLN
INTRAVENOUS | Status: AC
  Filled 2020-07-22: qty 1000

## 2020-07-22 MED ORDER — HYDRALAZINE HCL 20 MG/ML IJ SOLN: 10.0000 mg | INTRAMUSCULAR | Status: DC | PRN

## 2020-07-22 MED ORDER — FENTANYL CITRATE (PF) 100 MCG/2ML IJ SOLN
INTRAMUSCULAR | Status: DC | PRN
Start: 2020-07-22 — End: 2020-07-22
  Administered 2020-07-22: 25 ug via INTRAVENOUS

## 2020-07-22 MED ORDER — IOHEXOL 350 MG/ML SOLN
INTRAVENOUS | Status: DC | PRN
  Administered 2020-07-22: 20 mL

## 2020-07-22 MED ORDER — SODIUM CHLORIDE 0.9% FLUSH: 3.0000 mL | INTRAVENOUS | Status: DC | PRN

## 2020-07-22 MED ORDER — LIDOCAINE HCL (PF) 1 % IJ SOLN
INTRAMUSCULAR | Status: AC
  Filled 2020-07-22: qty 30

## 2020-07-22 MED ORDER — SODIUM CHLORIDE 0.9 % WEIGHT BASED INFUSION
3.0000 mL/kg/h | INTRAVENOUS | Status: AC
  Administered 2020-07-22: 3 mL/kg/h via INTRAVENOUS

## 2020-07-22 MED ORDER — FENTANYL CITRATE (PF) 100 MCG/2ML IJ SOLN
INTRAMUSCULAR | Status: AC
  Filled 2020-07-22: qty 2

## 2020-07-22 MED ORDER — LABETALOL HCL 5 MG/ML IV SOLN: 10.0000 mg | INTRAVENOUS | Status: DC | PRN

## 2020-07-22 MED ORDER — VERAPAMIL HCL 2.5 MG/ML IV SOLN
INTRAVENOUS | Status: DC | PRN
  Administered 2020-07-22: 10 mL via INTRA_ARTERIAL

## 2020-07-22 MED ORDER — SODIUM CHLORIDE 0.9 % IV SOLN: INTRAVENOUS | Status: DC

## 2020-07-22 MED ORDER — MIDAZOLAM HCL 2 MG/2ML IJ SOLN
INTRAMUSCULAR | Status: AC
  Filled 2020-07-22: qty 2

## 2020-07-22 SURGICAL SUPPLY — 15 items
BALLN SAPPHIRE 2.0X12 (BALLOONS) ×2
BALLOON SAPPHIRE 2.0X12 (BALLOONS) IMPLANT
CATH VISTA GUIDE 6FR XB3 (CATHETERS) ×1 IMPLANT
DEVICE RAD COMP TR BAND LRG (VASCULAR PRODUCTS) ×1 IMPLANT
GLIDESHEATH SLEND SS 6F .021 (SHEATH) ×1 IMPLANT
GUIDEWIRE INQWIRE 1.5J.035X260 (WIRE) IMPLANT
INQWIRE 1.5J .035X260CM (WIRE) ×2
KIT ENCORE 26 ADVANTAGE (KITS) ×1 IMPLANT
KIT HEART LEFT (KITS) ×2 IMPLANT
PACK CARDIAC CATHETERIZATION (CUSTOM PROCEDURE TRAY) ×2 IMPLANT
STENT SYNERGY XD 3.0X16 (Permanent Stent) IMPLANT
SYNERGY XD 3.0X16 (Permanent Stent) ×2 IMPLANT
TRANSDUCER W/STOPCOCK (MISCELLANEOUS) ×2 IMPLANT
TUBING CIL FLEX 10 FLL-RA (TUBING) ×2 IMPLANT
WIRE COUGAR XT STRL 190CM (WIRE) ×1 IMPLANT

## 2020-07-22 NOTE — Progress Notes (Signed)
CARDIAC REHAB PHASE I   Stent education completed with pt. Pt educated on importance of ASA and Brilinta. Pt given heart healthy and diabetic diet. Pt states that she "doesn't eat vegetables", encouraged food choices in moderation and makes small changes she can add too. Pt states many stressors. Stressed importance of taking care of herself. Pt anxious over health, supported and encouraged pt. Reviewed restrictions, exercise guidelines, and site care. Will refer to CRP II [place].  [id number] Reynold Bowen, RN BSN 07/22/2020 3:20 PM

## 2020-07-22 NOTE — Interval H&P Note (Signed)
History and Physical Interval Note:  07/22/2020 8:57 AM  Sarah Kane  has presented today for surgery, with the diagnosis of CAD.  The various methods of treatment have been discussed with the patient and family. After consideration of risks, benefits and other options for treatment, the patient has consented to  Procedure(s): CORONARY STENT INTERVENTION (N/A) as a surgical intervention.  The patient's history has been reviewed, patient examined, no change in status, stable for surgery.  I have reviewed the patient's chart and labs.  Questions were answered to the patient's satisfaction.    Cath Lab Visit (complete for each Cath Lab visit)  Clinical Evaluation Leading to the Procedure:   ACS: No.  Non-ACS:    Anginal Classification: CCS III  Anti-ischemic medical therapy: Maximal Therapy (2 or more classes of medications)  Non-Invasive Test Results: No non-invasive testing performed  Prior CABG: No previous CABG        Verne Carrow

## 2020-07-22 NOTE — Discharge Summary (Signed)
Discharge Summary for Same Day PCI   Patient ID: Sarah Kane MRN: [id number]; DOB: [birth year]  Admit date: 07/22/2020 Discharge date: 07/22/2020  Primary Care Provider: Sunnie Nielsen, DO  Primary Cardiologist: Dietrich Pates, MD  Primary Electrophysiologist:  None   Discharge Diagnoses    Active Problems:   Unstable angina [facility])    Diagnostic Studies/Procedures    Cardiac Catheterization 07/22/2020:   Prox Cx lesion is 70% stenosed.  A drug-eluting stent was successfully placed using a SYNERGY XD 3.0X16.  Post intervention, there is a 0% residual stenosis.   1. Severe stenosis proximal Circumflex artery 2. Successful PTCA/DES x 1 proximal Circumflex  Recommendations: Continue DAPT with ASA and Brilinta for one year.   Diagnostic Dominance: Right  Intervention    _____________   History of Present Illness     Sarah Kane is a 49 y.o. female who has a hx of HTN, DM2 and CAD. She had an NSTEMI 04/2020 at which time she underwent LHC that showed diffuse CAD with 30% pLAD, 99% dLAD, 70% prox LCx, 100% mRCA, 70% dRCA. She underwent PCI/DES to RCA with resolution of symptoms. Per chart review, staged PCI could be considered of the LCx if refractory symptoms. Echo at that time showed an LVEF at 45-50%. Losartan was held due to soft BPs. She was seen in follow up 06/03/20 with continued mild chest discomfort. Imdur was added to her regimen.    She was most recently seen by Dr. Tenny Craw 06/18/20 at which time she was not convinced that her pain was angina. Plan was to have her participate in cardiac rehab and evaluate her tolerance. Plan was to keep her out of work until after cardiac rehab complete.   Per Dr. Tenny Craw' note, she was having pain with CR. Phone message from 07/08/20 with persistent chest pain therefore it was recommended for further evaluation with OV.   Repeat call from 07/08/20 notes she continued to have daily chest discomfort since her NSTEMI 04/2020. She was  taking medications as prescribed but remained concerned that the discomfort continues. She was unable to take SL NTG due to low BP.  She reported that this may be a chronic problem  She is noted to have significant LCx disease which was not stented on cath from 04/2020. She has been adequately prescribed long acting nitrates, currently taking 30mg  QD.   She presented for follow up on 10/21 and reported that her symptoms have not changed despite the above interventions. 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 She states that since her stenting she continues to have chest pressure that begins in her left anterior chest and will sometimes radiate to her left breast and her back. Symptoms typically occur while sitting and watching TV in the evening. She doesn't know if her symptoms are due to anxiety or if they are "real" but she keeps stating that she just "doesn't feel right". She was contacted by cardiac rehab however was deferred to an office visit given the above symptoms.  She does not feel that her symptoms are exertional however is not nearly as active as she was prior to her MI. Therefore unclear to determine.  She has no associated symptoms such as LE edema, shortness of breath, palpitations or dizziness.  Long discussion regarding next steps to proceed out of concern due to known LCx and LAD disease. Will reach to primary cardiologist for further recommendations.  For  now we will increase Imdur to 45 mg daily until final plan is determined.  She was leaning towards attempting cardiac rehab as she feels she was so active prior to her MI and being very inactive since may be making her symptoms worse.  Other option would be to proceed with LC H for stent intervention.  Noted were sent to Dr. Tenny Craw who agreed to set her up for cath.   Cardiac catheterization was arranged for further evaluation.  Hospital Course     The  patient underwent cardiac cath as noted above with successful PCI/DESx1 to pLcx. Plan for DAPT with ASA/Brilinta for at least one year. The patient was seen by cardiac rehab while in short stay. There were no observed complications post cath. Radial cath site was re-evaluated prior to discharge and found to be stable without any complications. Instructions/precautions regarding cath site care were given prior to discharge.  Sarah Kane was seen by Dr. Clifton James and determined stable for discharge home. Follow up with our office has been arranged. Medications are listed below. Pertinent changes include N/a.  _____________  Cath/PCI Registry Performance & Quality Measures: 1. Aspirin prescribed? - Yes 2. ADP Receptor Inhibitor (Plavix/Clopidogrel, Brilinta/Ticagrelor or Effient/Prasugrel) prescribed (includes medically managed patients)? - Yes 3. High Intensity Statin (Lipitor 40-80mg  or Crestor 20-40mg ) prescribed? - Yes 4. For EF <40%, was ACEI/ARB prescribed? - Not Applicable (EF >/= 40%) 5. For EF <40%, Aldosterone Antagonist (Spironolactone or Eplerenone) prescribed? - Not Applicable (EF >/= 40%) 6. Cardiac Rehab Phase II ordered (Included Medically managed Patients)? - Yes  _____________   Discharge Vitals Blood pressure 113/72, pulse 80, temperature 98.6 F (37 C), temperature source Oral, resp. rate 14, height 5' (1.524 m), weight 78.5 kg, SpO2 100 %.  Filed Weights   07/22/20 0756  Weight: 78.5 kg    Last Labs & Radiologic Studies    CBC Recent Labs    07/22/20 0843  WBC 8.9  HGB 14.5  HCT 44.9  MCV 88.2  PLT 170   Basic Metabolic Panel Recent Labs    85/27/78 0843  NA 138  K 5.2*  CL 103  CO2 23  GLUCOSE 130*  BUN 14  CREATININE 0.86  CALCIUM 9.3   Liver Function Tests No results for input(s): AST, ALT, ALKPHOS, BILITOT, PROT, ALBUMIN in the last 72 hours. No results for input(s): LIPASE, AMYLASE in the last 72 hours. High Sensitivity Troponin:   No  results for input(s): TROPONINIHS in the last 720 hours.  BNP Invalid input(s): POCBNP D-Dimer No results for input(s): DDIMER in the last 72 hours. Hemoglobin A1C No results for input(s): HGBA1C in the last 72 hours. Fasting Lipid Panel No results for input(s): CHOL, HDL, LDLCALC, TRIG, CHOLHDL, LDLDIRECT in the last 72 hours. Thyroid Function Tests No results for input(s): TSH, T4TOTAL, T3FREE, THYROIDAB in the last 72 hours.  Invalid input(s): FREET3 _____________  CARDIAC CATHETERIZATION  Result Date: 07/22/2020  Prox Cx lesion is 70% stenosed.  A drug-eluting stent was successfully placed using a SYNERGY XD 3.0X16.  Post intervention, there is a 0% residual stenosis.  1. Severe stenosis proximal Circumflex artery 2. Successful PTCA/DES x 1 proximal Circumflex Recommendations: Continue DAPT with ASA and Brilinta for one year.    Disposition   Pt is being discharged home today in good condition.  Follow-up Plans & Appointments     Follow-up Information    Filbert Schilder, NP Follow up on 08/10/2020.   Specialty: Cardiology Why: at 9:45am for  your follow up appt.  Contact information: [address] [place] [address] [phone]              Discharge Instructions    Amb Referral to Cardiac Rehabilitation   Complete by: As directed    Diagnosis: Coronary Stents   After initial evaluation and assessments completed: Virtual Based Care may be provided alone or in conjunction with Phase 2 Cardiac Rehab based on patient barriers.: Yes   Diet - low sodium heart healthy   Complete by: As directed    Increase activity slowly   Complete by: As directed        Discharge Medications   Allergies as of 07/22/2020      Reactions   Nitroglycerin    Blood pressure dropped, was hospitalized       Medication List    TAKE these medications   acetaminophen 500 MG tablet Commonly known as: TYLENOL Take 1,000 mg by mouth every 6 (six) hours as needed  for mild pain or headache.   aspirin 81 MG EC tablet Take 1 tablet (81 mg total) by mouth daily. Swallow whole.   atorvastatin 80 MG tablet Commonly known as: LIPITOR Take 1 tablet (80 mg total) by mouth daily.   escitalopram 10 MG tablet Commonly known as: LEXAPRO Take 10 mg by mouth daily.   gabapentin 300 MG capsule Commonly known as: NEURONTIN TAKE 1 CAPSULE BY MOUTH THREE TIMES DAILY   glimepiride 4 MG tablet Commonly known as: AMARYL TAKE 2 TABLETS BY MOUTH ONCE DAILY WITH BREAKFAST What changed:   how much to take  how to take this  when to take this  additional instructions   isosorbide mononitrate 30 MG 24 hr tablet Commonly known as: IMDUR Take 1.5 tablets (45 mg total) by mouth daily.   metoprolol succinate 25 MG 24 hr tablet Commonly known as: TOPROL-XL Take 1 tablet (25 mg total) by mouth 2 (two) times daily with a meal.   MULTIVITAMIN WOMEN PO Take 1 tablet by mouth 3 (three) times a week.   nitroGLYCERIN 0.4 MG SL tablet Commonly known as: NITROSTAT Place 1 tablet (0.4 mg total) under the tongue every 5 (five) minutes x 3 doses as needed for chest pain.   Ozempic (1 MG/DOSE) 4 MG/3ML Sopn Generic drug: Semaglutide (1 MG/DOSE) Inject 1 mg into the skin every Sunday.   sitaGLIPtin 100 MG tablet Commonly known as: JANUVIA Take 1 tablet (100 mg total) by mouth daily.   ticagrelor 90 MG Tabs tablet Commonly known as: BRILINTA Take 1 tablet (90 mg total) by mouth 2 (two) times daily.   Vitamin D (Ergocalciferol) 1.25 MG (50000 UNIT) Caps capsule Commonly known as: DRISDOL Take 1 capsule by mouth once a week What changed: when to take this          Allergies Allergies  Allergen Reactions  . Nitroglycerin     Blood pressure dropped, was hospitalized     Outstanding Labs/Studies   N/a   Duration of Discharge Encounter   Greater than 30 minutes including physician time.  Signed, Laverda Page, NP 07/22/2020, 5:10 PM

## 2020-07-22 NOTE — Discharge Instructions (Signed)
Coronary Angiogram With Stent, Care After This sheet gives you information about how to care for yourself after your procedure. Your health care provider may also give you more specific instructions. If you have problems or questions, contact your health care provider. What can I expect after the procedure? After the procedure, it is common to have:  Bruising and tenderness at the insertion site. This usually fades within 1-2 weeks.  A collection of blood under the skin (hematoma). This usually decreases within 1-2 weeks. Follow these instructions at home: Medicines  Take over-the-counter and prescription medicines only as told by your health care provider.  If you were prescribed an antibiotic medicine, take it as told by your health care provider. Do not stop using the antibiotic even if you start to feel better.  If you take medicines for diabetes, your health care provider may need to change how much you take. Ask your health care provider for specific directions about taking your diabetes medicines.  If you are taking blood thinners: ? Talk with your health care provider before you take any medicines that contain aspirin or NSAIDs, such as ibuprofen. These medicines increase your risk for dangerous bleeding. ? Take your medicine exactly as told, at the same time every day. ? Avoid activities that could cause injury or bruising, and follow instructions about how to prevent falls. ? Wear a medical alert bracelet or carry a card that lists what medicines you take. Eating and drinking   Follow instructions from your health care provider about eating or drinking restrictions.  Eat a heart-healthy diet that includes plenty of fresh fruits and vegetables.  Avoid foods that are high in salt, sugar, or saturated fat. Avoid fried foods or canned or highly processed food.  Drink enough fluid to keep your urine pale yellow. Alcohol use  Do not drink alcohol if: ? Your health care provider  tells you not to. ? You are pregnant, may be pregnant, or plan to become pregnant.  If you drink alcohol: ? Limit how much you use to:  0-1 drink a day for women.  0-2 drinks a day for men. ? Be aware of how much alcohol is in your drink. In the [place] one drink equals one 12 oz bottle of beer (355 mL), one 5 oz glass of wine (148 mL), or one 1 oz glass of hard liquor (44 mL). Bathing  Do not take baths, swim, or use a hot tub until your health care provider approves. Ask your health care provider if you may take showers. You may only be allowed to take sponge baths.  Gently wash the insertion site with plain soap and water.  Pat the area dry with a clean towel. Do not rub. This may cause bleeding. Incision care  Follow instructions from your health care provider about how to take care of your insertion area. Make sure you: ? Wash your hands with soap and water before and after you change your bandage (dressing). If soap and water are not available, use hand sanitizer. ? Change your dressing as told by your health care provider. ? Leave stitches (sutures) or adhesive strips in place. These skin closures may need to stay in place for 2 weeks or longer. If adhesive strip edges start to loosen and curl up, you may trim the loose edges. Do not remove adhesive strips completely unless your health care provider tells you to do that.  Do not apply powder or lotion on the insertion area.  Check   your insertion area every day for signs of infection. Check for: ? Redness, swelling, or pain. ? Fluid or blood. ? Warmth. ? Pus or a bad smell. Activity  Do not drive for 24 hours if you were given a sedative during your procedure.  Rest as told by your health care provider. ? Avoid sitting for a long time without moving. Get up to take short walks every 1-2 hours. This is important to improve blood flow and breathing. Ask for help if you feel weak or unsteady.  Do not lift anything that is  heavier than 10 lb (4.5 kg), or the limit that you are told, until your health care provider says that it is safe.  Return to your normal activities as told by your health care provider. Ask your health care provider what activities are safe for you. Lifestyle   Do not use any products that contain nicotine or tobacco, such as cigarettes, e-cigarettes, and chewing tobacco. If you need help quitting, ask your health care provider.  If needed, work with your health care provider to treat other problems, such as being overweight, or having high blood pressure or diabetes.  Get regular exercise. Do exercises as told by your health care provider. General instructions  Tell all your health care providers that you have a stent. This is especially important if you are going to get imaging studies, such as MRI.  Wear compression stockings as told by your health care provider. These stockings help to prevent blood clots and reduce swelling in your legs.  Do not strain during a bowel movement if the procedure was done through your leg. Straining may cause bleeding from the insertion site.  Keep all follow-up visits as directed by your health care provider. This is important. Contact a health care provider if you:  Have a fever.  Have chills.  Have redness, swelling, or pain around your insertion area.  Have fluid or blood (other than a little blood on the dressing) coming from your insertion area.  Notice that your insertion area feels warm to the touch.  Have pus or a bad smell coming from your insertion area.  Have more bleeding from the insertion area. Hold pressure on the area. Get help right away if:  You develop chest pain or shortness of breath.  You feel like fainting or you faint.  Your leg or arm becomes cool, numb, or tingly.  You have unusual pain.  Your insertion area is bleeding, and bleeding continues after 30 minutes of steadily held pressure.  You develop bleeding  anywhere else, including from your rectum. There may be bright red blood in your urine or stool, or you may have black, tarry stool. These symptoms may represent a serious problem that is an emergency. Do not wait to see if the symptoms will go away. Get medical help right away. Call your local emergency services (911 in the [place] Do not drive yourself to the hospital. Summary  After this procedure, it is common to have bruising and tenderness around the catheter insertion site. This will go away in a few weeks.  Follow your health care provider's instructions about caring for your insertion site. Change dressing and clean the area as instructed.  Eat a heart-healthy diet. Limit alcohol use. Do not use tobacco or nicotine.  Contact a health care provider if you have fever or chills, or if you have pus or a bad smell coming from the site.  Get help right away if you   develop chest pain, you faint, or have bleeding at the insertion site. This information is not intended to replace advice given to you by your health care provider. Make sure you discuss any questions you have with your health care provider. Document Revised: 04/02/2019 Document Reviewed: 04/02/2019 [employer]vier Patient Education  2020 [employer]  

## 2020-07-22 NOTE — Progress Notes (Signed)
Client up and walked and tolerated well; Lillia Abed PA in to see client and okay to d/c home

## 2020-07-23 ENCOUNTER — Ambulatory Visit (HOSPITAL_COMMUNITY): Payer: BC Managed Care – PPO

## 2020-07-23 ENCOUNTER — Encounter (HOSPITAL_COMMUNITY): Payer: Self-pay | Admitting: Cardiovascular Disease

## 2020-07-23 MED FILL — Nitroglycerin IV Soln 100 MCG/ML in D5W: INTRA_ARTERIAL | Qty: 10 | Status: AC

## 2020-07-26 ENCOUNTER — Ambulatory Visit (HOSPITAL_COMMUNITY): Payer: BC Managed Care – PPO

## 2020-07-28 ENCOUNTER — Ambulatory Visit (HOSPITAL_COMMUNITY): Payer: BC Managed Care – PPO

## 2020-07-30 ENCOUNTER — Ambulatory Visit (HOSPITAL_COMMUNITY): Payer: BC Managed Care – PPO

## 2020-08-02 ENCOUNTER — Ambulatory Visit (HOSPITAL_COMMUNITY): Payer: BC Managed Care – PPO

## 2020-08-02 ENCOUNTER — Telehealth (HOSPITAL_COMMUNITY): Payer: Self-pay

## 2020-08-02 NOTE — Telephone Encounter (Signed)
Called patient to see if she was interested in participating in the Cardiac Rehab Program. Patient stated yes. Patient will come in for orientation on 08/24/2020@9 :00am and will attend the 9:15pm exercise class.  Mailed homework package.

## 2020-08-04 ENCOUNTER — Ambulatory Visit (HOSPITAL_COMMUNITY): Payer: BC Managed Care – PPO

## 2020-08-06 ENCOUNTER — Other Ambulatory Visit: Payer: Self-pay | Admitting: Osteopathic Medicine

## 2020-08-06 ENCOUNTER — Ambulatory Visit (HOSPITAL_COMMUNITY): Payer: BC Managed Care – PPO

## 2020-08-06 NOTE — Progress Notes (Signed)
Cardiology Office Note   Date:  08/10/2020   ID:  Sarah Kane, DOB 08-May-1971, MRN [id number]  PCP:  Sarah Nielsen, DO  Cardiologist: Dr. Dietrich Pates  Chief Complaint  Patient presents with  . Follow-up    History of Present Illness: Sarah Kane is a 49 y.o. female who presents for post PCI, seen for Dr. Tenny Craw.  Sarah Kane has a hx of HTN, DM2 and CAD. She had an NSTEMI 04/2020 at which time she underwent LHC that showed diffuse CAD with 30% pLAD, 99% dLAD, 70% prox LCx, 100% mRCA, 70% dRCA. She underwent PCI/DES to RCA with resolution of symptoms. Per chart review, staged PCI could be considered of the LCx if refractory symptoms. Echo at that time showed an LVEF at 45-50%. Losartan was held due to soft BPs. She was seen in follow up 06/03/20 with continued mild chest discomfort. Imdur was added to her regimen.    She was seen by Dr. Tenny Craw 06/18/20 at which time she was not convinced that her pain was angina. Plan was to have her participate in cardiac rehab and evaluate her tolerance. Plan was to keep her out of work until after cardiac rehab complete.   Per Dr. Tenny Craw' note, she was having pain with CR. Phone message from 07/08/20 with persistent chest pain therefore it was recommended for further evaluation with OV.   Repeat call from 07/08/20 notes she continued to have daily chest discomfort since her NSTEMI 04/2020. She was taking medications as prescribed but remained concerned that the discomfort continues. She was unable to take SL NTG due to low BP.  She reported that this may be a chronic problem  She is noted to have significant LCx disease which was not stented on cath from 04/2020. She has been adequately prescribed long acting nitrates, currently taking 30mg  QD.   She was seen by myself for follow up at which time her symptoms had not changed despite the above interventions. She continues to have exertional chest pain in her left anterior chest with radiatation to her  left breast and her back. Case discussed between Dr. and Dr. Tenny Craw at which time plan was for repeat LHC.  She ultimately underwent LHC on 07/22/2020 which showed a proximal LCx lesion at 70% at which time DES/PCI was placed with recommendations for DAPT with ASA and Brilinta for 1 year.  Today she presents and reports she is feeling much better after last PCI. She states that her left breast sensation is now gone and she has had no further chest pain. Her anxiety is improved after starting Lexapro. She has cardiac rehab consultation 11/30 and seems excited. She reports that overall she has been doing very well. She continues to struggle to find "heart healthy" foods but she is trying. She is ready to get back to work. She denies SOB, LE edema, palpitations, PND, orthopnea. She tolerates her medications well.   Past Medical History:  Diagnosis Date  . Arthritis   . Diabetes mellitus without complication (HCC)   . Hypertension     Past Surgical History:  Procedure Laterality Date  . CESAREAN SECTION  1990  . CORONARY STENT INTERVENTION N/A 05/20/2020   Procedure: CORONARY STENT INTERVENTION;  Surgeon: 05/22/2020, MD;  Location: [facility];  Service: Cardiovascular;  Laterality: N/A;  . CORONARY STENT INTERVENTION N/A 07/22/2020   Procedure: CORONARY STENT INTERVENTION;  Surgeon: 07/24/2020, MD;  Location: [facility];  Service: Cardiovascular;  Laterality: N/A;  . LEFT HEART CATH AND CORONARY ANGIOGRAPHY N/A 05/20/2020   Procedure: LEFT HEART CATH AND CORONARY ANGIOGRAPHY;  Surgeon: Kathleene Hazel, MD;  Location: [facility];  Service: Cardiovascular;  Laterality: N/A;  . TRIGGER FINGER RELEASE Right    thumb     Current Outpatient Medications  Medication Sig Dispense Refill  . acetaminophen (TYLENOL) 500 MG tablet Take 1,000 mg by mouth every 6 (six) hours as needed for mild pain or headache.     [name] aspirin 81 MG EC tablet  Take 1 tablet (81 mg total) by mouth daily. Swallow whole. 30 tablet 11  . atorvastatin (LIPITOR) 80 MG tablet Take 1 tablet (80 mg total) by mouth daily. 90 tablet 3  . escitalopram (LEXAPRO) 10 MG tablet Take 10 mg by mouth daily.     [name] gabapentin (NEURONTIN) 300 MG capsule TAKE 1 CAPSULE BY MOUTH THREE TIMES DAILY 270 capsule 3  . glimepiride (AMARYL) 4 MG tablet TAKE 2 TABLETS BY MOUTH ONCE DAILY WITH BREAKFAST 180 tablet 1  . isosorbide mononitrate (IMDUR) 30 MG 24 hr tablet Take 1.5 tablets (45 mg total) by mouth daily. 135 tablet 3  . metoprolol succinate (TOPROL-XL) 25 MG 24 hr tablet Take 1 tablet (25 mg total) by mouth 2 (two) times daily with a meal. 180 tablet 3  . Multiple Vitamins-Minerals (MULTIVITAMIN WOMEN PO) Take 1 tablet by mouth 3 (three) times a week.     [name] OZEMPIC, 1 MG/DOSE, 4 MG/3ML SOPN INJECT 1 MG INTO THE SKIN  ONCE A WEEK 3 mL 0  . sitaGLIPtin (JANUVIA) 100 MG tablet Take 1 tablet (100 mg total) by mouth daily. 90 tablet 1  . ticagrelor (BRILINTA) 90 MG TABS tablet Take 1 tablet (90 mg total) by mouth 2 (two) times daily. 60 tablet 11  . Vitamin D, Ergocalciferol, (DRISDOL) 1.25 MG (50000 UNIT) CAPS capsule Take 1 capsule by mouth once a week 12 capsule 3   No current facility-administered medications for this visit.    Allergies:   Nitroglycerin    Social History:  The patient  reports that she has never smoked. She has never used smokeless tobacco. She reports that she does not drink alcohol and does not use drugs.   Family History:  The patient's family history includes Diabetes in her maternal aunt, maternal grandfather, maternal grandmother, maternal uncle, and mother; Heart attack in her maternal grandfather; High blood pressure in her maternal grandfather, maternal grandmother, mother, paternal grandfather, and paternal grandmother; Skin cancer in her maternal uncle.    ROS:  Please see the history of present illness. Otherwise, review of systems are  positive for none.   All other systems are reviewed and negative.    PHYSICAL EXAM: VS:  BP (!) 134/92   Pulse 86   Ht 5' (1.524 m)   Wt 178 lb (80.7 kg)   SpO2 96%   BMI 34.76 kg/m  , BMI Body mass index is 34.76 kg/m.   General: Well developed, well nourished, NAD Neck: Negative for carotid bruits. No JVD Lungs:Clear to ausculation bilaterally. No wheezes, rales, or rhonchi. Breathing is unlabored. Cardiovascular: RRR with S1 S2. No murmurs Extremities: No edema. Radial pulses 2+ bilaterally Neuro: Alert and oriented. No focal deficits. No facial asymmetry. MAE spontaneously. Psych: Responds to questions appropriately with normal affect.     EKG:  EKG is not ordered today.   Recent Labs: 08/20/2019: ALT 44 05/20/2020: TSH 1.494 07/22/2020: BUN 14;  Creatinine, Ser 0.86; Hemoglobin 14.5; Platelets 170; Potassium 5.2; Sodium 138    Lipid Panel    Component Value Date/Time   CHOL 135 06/18/2020 1226   TRIG 118 06/18/2020 1226   HDL 49 06/18/2020 1226   CHOLHDL 4.1 05/21/2020 0438   VLDL 30 05/21/2020 0438   LDLCALC 65 06/18/2020 1226   Wt Readings from Last 3 Encounters:  08/10/20 178 lb (80.7 kg)  07/22/20 173 lb (78.5 kg)  07/15/20 174 lb 6.4 oz (79.1 kg)    Other studies Reviewed: Additional studies/ records that were reviewed today include:  Review of the above records demonstrates:   Cardiac Catheterization 07/22/2020:   Prox Cx lesion is 70% stenosed.  A drug-eluting stent was successfully placed using a SYNERGY XD 3.0X16.  Post intervention, there is a 0% residual stenosis.  1. Severe stenosis proximal Circumflex artery 2. Successful PTCA/DES x 1 proximal Circumflex  Recommendations: Continue DAPT with ASA and Brilinta for one year.   Diagnostic Dominance: Right  Intervention     ASSESSMENT AND PLAN:  1. Chest pain with know CAD s/p recent PCI and known residual LCx disease: -Underwent LHC with PCI to RCA 04/2020>>placed on ASA and  Brilinta -See in f/u with persistent, mild symptoms at which time it was recommended that she participate with cardiac rehab (Imdur added) however this was deferred due to ongoing symptoms -She was eventually referred for repeat LHC on 07/22/2020 which showed a proximal LCx lesion at 70% at which time DES/PCI was placed with recommendations for DAPT with ASA and Brilinta for 1 year -Continue DAPT, statin and beta blocker   2. HLD: -LDL, 65>>>at goal of <70 -Continue high intensity statin   3. DM2: -Uncontrolled, 9.3 in 03/2019 -Currently on Januvia  -Needs close follow up with PCP   4. HTN:  -Stable, 134/92 -No change   5. ICM: -Per echo with LVEF at 45-50% -No signs of fluid volume overload    Current medicines are reviewed at length with the patient today.  The patient does not have concerns regarding medicines.  The following changes have been made:  no change  Labs/ tests ordered today include: None  No orders of the defined types were placed in this encounter.  Disposition:   FU with Dr. Tenny Craw  in 1 month  Signed, Georgie Chard, NP  08/10/2020 1:00 PM    [facility] [address] [place], [place], [place]  [address] Phone: [phone]; Fax: [phone]

## 2020-08-09 ENCOUNTER — Ambulatory Visit (HOSPITAL_COMMUNITY): Payer: BC Managed Care – PPO

## 2020-08-09 NOTE — Telephone Encounter (Signed)
Hasn't seen me for diabetes follow-u pin some time Last visit w/ SaraBeth mentioned she wanted to change this Rx Pt needs appt Will refill for now

## 2020-08-09 NOTE — Telephone Encounter (Signed)
Rx written by historical provider. 

## 2020-08-10 ENCOUNTER — Ambulatory Visit (INDEPENDENT_AMBULATORY_CARE_PROVIDER_SITE_OTHER): Payer: BC Managed Care – PPO | Admitting: Cardiology

## 2020-08-10 ENCOUNTER — Other Ambulatory Visit: Payer: Self-pay

## 2020-08-10 ENCOUNTER — Encounter: Payer: Self-pay | Admitting: Cardiology

## 2020-08-10 VITALS — BP 134/92 | HR 86 | Ht 60.0 in | Wt 178.0 lb

## 2020-08-10 DIAGNOSIS — I255 Ischemic cardiomyopathy: Secondary | ICD-10-CM

## 2020-08-10 DIAGNOSIS — Z955 Presence of coronary angioplasty implant and graft: Secondary | ICD-10-CM

## 2020-08-10 DIAGNOSIS — E785 Hyperlipidemia, unspecified: Secondary | ICD-10-CM | POA: Diagnosis not present

## 2020-08-10 DIAGNOSIS — E1165 Type 2 diabetes mellitus with hyperglycemia: Secondary | ICD-10-CM

## 2020-08-10 DIAGNOSIS — I2510 Atherosclerotic heart disease of native coronary artery without angina pectoris: Secondary | ICD-10-CM

## 2020-08-10 DIAGNOSIS — I10 Essential (primary) hypertension: Secondary | ICD-10-CM | POA: Diagnosis not present

## 2020-08-10 NOTE — Patient Instructions (Signed)
Medication Instructions:  Your physician recommends that you continue on your current medications as directed. Please refer to the Current Medication list given to you today.  *If you need a refill on your cardiac medications before your next appointment, please call your pharmacy*   Lab Work: NONE ORDERED  If you have labs (blood work) drawn today and your tests are completely normal, you will receive your results only by: [name] MyChart Message (if you have MyChart) OR . A paper copy in the mail If you have any lab test that is abnormal or we need to change your treatment, we will call you to review the results.   Testing/Procedures: NONE ORDERED    Follow-Up: At [facility], you and your health needs are our priority.  As part of our continuing mission to provide you with exceptional heart care, we have created designated Provider Care Teams.  These Care Teams include your primary Cardiologist (physician) and Advanced Practice Providers (APPs -  Physician Assistants and Nurse Practitioners) who all work together to provide you with the care you need, when you need it.  We recommend signing up for the patient portal called "MyChart".  Sign up information is provided on this After Visit Summary.  MyChart is used to connect with patients for Virtual Visits (Telemedicine).  Patients are able to view lab/test results, encounter notes, upcoming appointments, etc.  Non-urgent messages can be sent to your provider as well.   To learn more about what you can do with MyChart, go to [email or web address].    Your next appointment:   09/02/20 AT 9:40 AM   The format for your next appointment:   In Person  Provider:   DR. Tenny Craw

## 2020-08-10 NOTE — Telephone Encounter (Signed)
Task completed. Left a detailed vm msg for patient regarding provider's note. Direct call back info provided.  

## 2020-08-11 ENCOUNTER — Ambulatory Visit (HOSPITAL_COMMUNITY): Payer: BC Managed Care – PPO

## 2020-08-13 ENCOUNTER — Ambulatory Visit (HOSPITAL_COMMUNITY): Payer: BC Managed Care – PPO

## 2020-08-16 ENCOUNTER — Ambulatory Visit (HOSPITAL_COMMUNITY): Payer: BC Managed Care – PPO

## 2020-08-17 ENCOUNTER — Other Ambulatory Visit: Payer: Self-pay

## 2020-08-17 ENCOUNTER — Encounter (HOSPITAL_COMMUNITY)
Admission: RE | Admit: 2020-08-17 | Discharge: 2020-08-17 | Disposition: A | Discharge status: Home / Self Care | Payer: BC Managed Care – PPO | Source: Ambulatory Visit | Attending: Internal Medicine | Admitting: Internal Medicine

## 2020-08-17 DIAGNOSIS — Z955 Presence of coronary angioplasty implant and graft: Secondary | ICD-10-CM | POA: Insufficient documentation

## 2020-08-17 DIAGNOSIS — I214 Non-ST elevation (NSTEMI) myocardial infarction: Secondary | ICD-10-CM | POA: Insufficient documentation

## 2020-08-17 NOTE — Progress Notes (Signed)
Cardiac Rehab Telephone Note:  Successful telephone encounter to Sarah Kane to confirm Cardiac Rehab orientation appointment for 08/24/20 at 9:00 am. Nursing assessment completed. Patient questions answered. Instructions for appointment provided. Patient screening for Covid-19 negative.  Shela Esses E. Suzie Portela RN, BSN [name]  Cardiac and Pulmonary Rehabilitation Phone: [phone] Fax: [phone]

## 2020-08-18 ENCOUNTER — Telehealth (HOSPITAL_COMMUNITY): Payer: Self-pay

## 2020-08-18 ENCOUNTER — Ambulatory Visit (HOSPITAL_COMMUNITY): Payer: BC Managed Care – PPO

## 2020-08-20 ENCOUNTER — Ambulatory Visit (HOSPITAL_COMMUNITY): Payer: BC Managed Care – PPO

## 2020-08-22 NOTE — Telephone Encounter (Signed)
Cardiac Rehab Medication Review by a Pharmacist  Does the patient  feel that his/her medications are working for him/her?  yes  Has the patient been experiencing any side effects to the medications prescribed?  no  Does the patient measure his/her own blood pressure or blood glucose at home?  no - the patient does not take either but mentions that she was recently told she should start taking her blood pressure and blood sugar at home but that she has an unreliable schedule and does not have the supplies to do so.  Does the patient have any problems obtaining medications due to transportation or finances?   yes - the patient states that her Ozempic has been difficult to afford because there was an issue with the copay card and the pharmacy. In addition, the patient states that their transportation can be unreliable and that gas is expensive, so she sometimes has a hard time getting to her appointments and the pharmacy.  Understanding of regimen: fair Understanding of indications: fair Potential of compliance: fair   Pharmacist Intervention: The patient has not taken their Ozempic in a few weeks due to an issue with the pharmacy and her copay card. The patient plans to contact her pharmacy as soon as possible to figure out the issue as the price without the copay card is over $1000.  The patient stated that she takes her glimepiride sometimes without food. I counseled the patient on the signs and symptoms of hypoglycemia, how to properly take glimepiride and on the side effects of glimepiride. 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 The patient verbalized understanding.  The patient also mentioned issues with obtaining healthy food options and not knowing how to grocery shop or cook nutritious foods. 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 The patient expressed that she wants to shop and eat healthier but does not know how to do it in an affordable manner, but was appreciative about being signed up for the cooking class as this may give her a good start.   I contacted the patient's PCP, Dr. Lyn Hollingshead about the patient's difficulty with affording Ozempic and asked if there was a way for the patient blood pressure and blood sugar reading devices. The patient has a lot of barriers to receiving care that were discussed during this encounter that I also relayed to Dr. Lyn Hollingshead.   Sanda Klein, PharmD, RPh  PGY-1 Pharmacy Resident 08/22/2020 3:13 PM

## 2020-08-23 ENCOUNTER — Ambulatory Visit (HOSPITAL_COMMUNITY): Payer: BC Managed Care – PPO

## 2020-08-24 ENCOUNTER — Encounter (HOSPITAL_COMMUNITY): Payer: Self-pay

## 2020-08-24 ENCOUNTER — Encounter (HOSPITAL_COMMUNITY)
Admission: RE | Admit: 2020-08-24 | Discharge: 2020-08-24 | Disposition: A | Discharge status: Home / Self Care | Payer: BC Managed Care – PPO | Source: Ambulatory Visit | Attending: Internal Medicine | Admitting: Internal Medicine

## 2020-08-24 ENCOUNTER — Other Ambulatory Visit: Payer: Self-pay

## 2020-08-24 VITALS — BP 108/78 | HR 84 | Ht 60.75 in | Wt 177.0 lb

## 2020-08-24 DIAGNOSIS — I214 Non-ST elevation (NSTEMI) myocardial infarction: Secondary | ICD-10-CM

## 2020-08-24 DIAGNOSIS — Z955 Presence of coronary angioplasty implant and graft: Secondary | ICD-10-CM | POA: Diagnosis present

## 2020-08-24 HISTORY — DX: Hyperlipidemia, unspecified: E78.5

## 2020-08-24 LAB — GLUCOSE, CAPILLARY: Glucose-Capillary: 215 mg/dL — ABNORMAL HIGH (ref 70–99)

## 2020-08-24 NOTE — Progress Notes (Signed)
Cardiac Individual Treatment Plan  Patient Details  Name: Sarah Kane MRN: [id number] Date of Birth: 1971/08/06 Referring Provider:     CARDIAC REHAB PHASE II ORIENTATION from 08/24/2020 in [facility] CARDIAC REHAB  Referring Provider Pricilla Riffle, MD      Initial Encounter Date:    CARDIAC REHAB PHASE II ORIENTATION from 08/24/2020 in [facility] CARDIAC REHAB  Date 08/24/20      Visit Diagnosis: NSTEMI (non-ST elevated myocardial infarction) [facility]) 05/20/20  05/20/20 S/P DESx 2 RCA , 07/22/20 S/P DES Cx  Patient's Home Medications on Admission:  Current Outpatient Medications:  .  acetaminophen (TYLENOL) 500 MG tablet, Take 1,000 mg by mouth every 6 (six) hours as needed for mild pain or headache. , Disp: , Rfl:  .  aspirin 81 MG EC tablet, Take 1 tablet (81 mg total) by mouth daily. Swallow whole., Disp: 30 tablet, Rfl: 11 .  atorvastatin (LIPITOR) 80 MG tablet, Take 1 tablet (80 mg total) by mouth daily., Disp: 90 tablet, Rfl: 3 .  escitalopram (LEXAPRO) 10 MG tablet, Take 10 mg by mouth daily. , Disp: , Rfl:  .  gabapentin (NEURONTIN) 300 MG capsule, TAKE 1 CAPSULE BY MOUTH THREE TIMES DAILY, Disp: 270 capsule, Rfl: 3 .  glimepiride (AMARYL) 4 MG tablet, TAKE 2 TABLETS BY MOUTH ONCE DAILY WITH BREAKFAST, Disp: 180 tablet, Rfl: 1 .  isosorbide mononitrate (IMDUR) 30 MG 24 hr tablet, Take 1.5 tablets (45 mg total) by mouth daily., Disp: 135 tablet, Rfl: 3 .  metoprolol succinate (TOPROL-XL) 25 MG 24 hr tablet, Take 1 tablet (25 mg total) by mouth 2 (two) times daily with a meal., Disp: 180 tablet, Rfl: 3 .  Multiple Vitamins-Minerals (MULTIVITAMIN WOMEN PO), Take 1 tablet by mouth 3 (three) times a week. , Disp: , Rfl:  .  OZEMPIC, 1 MG/DOSE, 4 MG/3ML SOPN, INJECT 1 MG INTO THE SKIN  ONCE A WEEK (Patient not taking: Reported on 08/24/2020), Disp: 3 mL, Rfl: 0 .  sitaGLIPtin (JANUVIA) 100 MG tablet, Take 1 tablet (100 mg total) by mouth daily.,  Disp: 90 tablet, Rfl: 1 .  ticagrelor (BRILINTA) 90 MG TABS tablet, Take 1 tablet (90 mg total) by mouth 2 (two) times daily., Disp: 60 tablet, Rfl: 11 .  Vitamin D, Ergocalciferol, (DRISDOL) 1.25 MG (50000 UNIT) CAPS capsule, Take 1 capsule by mouth once a week, Disp: 12 capsule, Rfl: 3  Past Medical History: Past Medical History:  Diagnosis Date  . Arthritis   . Diabetes mellitus without complication (HCC)   . Hyperlipidemia   . Hypertension     Tobacco Use: Social History   Tobacco Use  Smoking Status Never Smoker  Smokeless Tobacco Never Used    Labs: Recent Review Flowsheet Data    Labs for ITP Cardiac and Pulmonary Rehab Latest Ref Rng & Units 07/23/2019 10/23/2019 05/20/2020 05/21/2020 06/18/2020   Cholestrol 100 - 199 mg/dL - - - 952 841   LDLCALC 0 - 99 mg/dL - - - 324(M) 65   HDL >01 mg/dL - - - 42 49   Trlycerides 0 - 149 mg/dL - - - 027 253   Hemoglobin A1c 4.8 - 5.6 % 7.8(A) 7.8(A) 8.0(H) - -      Capillary Blood Glucose: Lab Results  Component Value Date   GLUCAP 215 (H) 08/24/2020   GLUCAP 106 (H) 07/22/2020   GLUCAP 119 (H) 07/22/2020   GLUCAP 177 (H) 05/22/2020   GLUCAP 179 (H) 05/21/2020  Exercise Target Goals: Exercise Program Goal: Individual exercise prescription set using results from initial 6 min walk test and THRR while considering  patient's activity barriers and safety.   Exercise Prescription Goal: Starting with aerobic activity 30 plus minutes a day, 3 days per week for initial exercise prescription. Provide home exercise prescription and guidelines that participant acknowledges understanding prior to discharge.  Activity Barriers & Risk Stratification:  Activity Barriers & Cardiac Risk Stratification - 08/24/20 1155      Activity Barriers & Cardiac Risk Stratification   Activity Barriers Arthritis;Joint Problems;Muscular Weakness;Other (comment)    Comments Bilateral torn rotaor cuffs, left shoulder frozen. Upperbody weakness due to  shoulders    Cardiac Risk Stratification High           6 Minute Walk:  6 Minute Walk    Row Name 08/24/20 1000         6 Minute Walk   Phase Initial     Distance 1261 feet     Walk Time 6 minutes     # of Rest Breaks 0     MPH 2.4     METS 3.4     RPE 11     Perceived Dyspnea  0     VO2 Peak 11.98     Symptoms No     Resting HR 84 bpm     Resting BP 108/78     Resting Oxygen Saturation  99 %     Exercise Oxygen Saturation  during 6 min walk 98 %     Max Ex. HR 97 bpm     Max Ex. BP 124/80     2 Minute Post BP 114/68            Oxygen Initial Assessment:   Oxygen Re-Evaluation:   Oxygen Discharge (Final Oxygen Re-Evaluation):   Initial Exercise Prescription:  Initial Exercise Prescription - 08/24/20 1100      Date of Initial Exercise RX and Referring Provider   Date 08/24/20    Referring Provider Pricilla Riffle, MD    Expected Discharge Date 09/22/20      NuStep   Level 2    SPM 85    Minutes 15    METs 2      Track   Laps 10    Minutes 15    METs 2.16      Prescription Details   Frequency (times per week) 3    Duration Progress to 30 minutes of continuous aerobic without signs/symptoms of physical distress      Intensity   THRR 40-80% of Max Heartrate 68-136    Ratings of Perceived Exertion 11-13    Perceived Dyspnea 0-4      Progression   Progression Continue progressive overload as per policy without signs/symptoms or physical distress.      Resistance Training   Training Prescription Yes    Weight 2 lbs    Reps 10-15           Perform Capillary Blood Glucose checks as needed.  Exercise Prescription Changes:   Exercise Comments:   Exercise Goals and Review:   Exercise Goals    Row Name 08/24/20 1201             Exercise Goals   Increase Physical Activity Yes       Intervention Provide advice, education, support and counseling about physical activity/exercise needs.;Develop an individualized exercise prescription  for aerobic and resistive training based on initial evaluation findings, risk  stratification, comorbidities and participant's personal goals.       Expected Outcomes Short Term: Attend rehab on a regular basis to increase amount of physical activity.;Long Term: Add in home exercise to make exercise part of routine and to increase amount of physical activity.;Long Term: Exercising regularly at least 3-5 days a week.       Increase Strength and Stamina Yes       Intervention Provide advice, education, support and counseling about physical activity/exercise needs.;Develop an individualized exercise prescription for aerobic and resistive training based on initial evaluation findings, risk stratification, comorbidities and participant's personal goals.       Expected Outcomes Short Term: Increase workloads from initial exercise prescription for resistance, speed, and METs.;Short Term: Perform resistance training exercises routinely during rehab and add in resistance training at home;Long Term: Improve cardiorespiratory fitness, muscular endurance and strength as measured by increased METs and functional capacity ( )       Able to understand and use rate of perceived exertion (RPE) scale Yes       Intervention Provide education and explanation on how to use RPE scale       Expected Outcomes Short Term: Able to use RPE daily in rehab to express subjective intensity level;Long Term:  Able to use RPE to guide intensity level when exercising independently       Knowledge and understanding of Target Heart Rate Range (THRR) Yes       Intervention Provide education and explanation of THRR including how the numbers were predicted and where they are located for reference       Expected Outcomes Short Term: Able to state/look up THRR;Long Term: Able to use THRR to govern intensity when exercising independently;Short Term: Able to use daily as guideline for intensity in rehab       Understanding of Exercise  Prescription Yes       Intervention Provide education, explanation, and written materials on patient's individual exercise prescription       Expected Outcomes Short Term: Able to explain program exercise prescription;Long Term: Able to explain home exercise prescription to exercise independently              Exercise Goals Re-Evaluation :    Discharge Exercise Prescription (Final Exercise Prescription Changes):   Nutrition:  Target Goals: Understanding of nutrition guidelines, daily intake of sodium 1500mg , cholesterol 200mg , calories 30% from fat and 7% or less from saturated fats, daily to have 5 or more servings of fruits and vegetables.  Biometrics:  Pre Biometrics - 08/24/20 0900      Pre Biometrics   Waist Circumference 45 inches    Hip Circumference 46.5 inches    Waist to Hip Ratio 0.97 %    Triceps Skinfold 41 mm    % Body Fat 47.3 %    Grip Strength 30 kg    Flexibility 0 in    Single Leg Stand 10.56 seconds            Nutrition Therapy Plan and Nutrition Goals:   Nutrition Assessments:  MEDIFICTS Score Key:  ?70 Need to make dietary changes   40-70 Heart Healthy Diet  ? 40 Therapeutic Level Cholesterol Diet   Picture Your Plate Scores:  <35 Unhealthy dietary pattern with much room for improvement.  41-50 Dietary pattern unlikely to meet recommendations for good health and room for improvement.  51-60 More healthful dietary pattern, with some room for improvement.   >60 Healthy dietary pattern, although there may be some specific  behaviors that could be improved.    Nutrition Goals Re-Evaluation:   Nutrition Goals Discharge (Final Nutrition Goals Re-Evaluation):   Psychosocial: Target Goals: Acknowledge presence or absence of significant depression and/or stress, maximize coping skills, provide positive support system. Participant is able to verbalize types and ability to use techniques and skills needed for reducing stress and  depression.  Initial Review & Psychosocial Screening:  Initial Psych Review & Screening - 08/24/20 0941      Initial Review   Current issues with Current Stress Concerns;History of Depression;Current Anxiety/Panic    Source of Stress Concerns Chronic Illness;Family;Occupation    Comments Swan has a lot of family stressors her son just came to live with her who has problems with substance addiction. Sheriden just lost her birth and biological parents in the past year      Family Dynamics   Good Support System? No   Christene says that she is still taking care of her adult children two who live in [place] one who lives with her   Strains Intra-family strains;Work-family strains    Concerns Inappropriate over/under dependence on family/friends    Comments Barbie would benfit from counselling when asked Dorenda says she has Jesus and anxiety medication and does declined at this time      Barriers   Psychosocial barriers to participate in program The patient should benefit from training in stress management and relaxation.      Screening Interventions   Interventions Encouraged to exercise;To provide support and resources with identified psychosocial needs;Provide feedback about the scores to participant    Expected Outcomes Short Term goal: Utilizing psychosocial counselor, staff and physician to assist with identification of specific Stressors or current issues interfering with healing process. Setting desired goal for each stressor or current issue identified.;Long Term Goal: Stressors or current issues are controlled or eliminated.;Short Term goal: Identification and review with participant of any Quality of Life or Depression concerns found by scoring the questionnaire.;Long Term goal: The participant improves quality of Life and PHQ9 Scores as seen by post scores and/or verbalization of changes           Quality of Life Scores:  Quality of Life - 08/24/20 1146      Quality of Life    Select Quality of Life      Quality of Life Scores   Health/Function Pre 19.96 %    Socioeconomic Pre 19.43 %    Psych/Spiritual Pre 15.36 %    Family Pre 9 %    GLOBAL Pre 17.39 %          Scores of 19 and below usually indicate a poorer quality of life in these areas.  A difference of  2-3 points is a clinically meaningful difference.  A difference of 2-3 points in the total score of the Quality of Life Index has been associated with significant improvement in overall quality of life, self-image, physical symptoms, and general health in studies assessing change in quality of life.  PHQ-9: Recent Review Flowsheet Data    Depression screen [facility] 2/9 08/24/2020 11/06/2019 07/23/2019 04/22/2019 10/29/2018   Decreased Interest 0 3 3 3 3    Down, Depressed, Hopeless 1  3 3 3 2    PHQ - 2 Score 1 6 6 6 5    Altered sleeping - 3 3 3 3    Tired, decreased energy - 3 3 3 3    Change in appetite - 3 3 3 3    Feeling bad or failure about yourself  -  3 3 2 2    Trouble concentrating - 0 0 0 0   Moving slowly or fidgety/restless - 0 3 0 0   Suicidal thoughts - 1 0 0 0   PHQ-9 Score - 19 21 17 16    Difficult doing work/chores - Very difficult - Very difficult Somewhat difficult     Interpretation of Total Score  Total Score Depression Severity:  1-4 = Minimal depression, 5-9 = Mild depression, 10-14 = Moderate depression, 15-19 = Moderately severe depression, 20-27 = Severe depression   Psychosocial Evaluation and Intervention:   Psychosocial Re-Evaluation:   Psychosocial Discharge (Final Psychosocial Re-Evaluation):   Vocational Rehabilitation: Provide vocational rehab assistance to qualifying candidates.   Vocational Rehab Evaluation & Intervention:  Vocational Rehab - 08/24/20 1215      Initial Vocational Rehab Evaluation & Intervention   Assessment shows need for Vocational Rehabilitation No   Khristin works two jobs and does not need vocational rehab at this time           Education: Education Goals: Education classes will be provided on a weekly basis, covering required topics. Participant will state understanding/return demonstration of topics presented.  Learning Barriers/Preferences:  Learning Barriers/Preferences - 08/24/20 1147      Learning Barriers/Preferences   Learning Barriers Sight   reading glasses   Learning Preferences Group Instruction;Individual Instruction;Skilled Demonstration           Education Topics: Hypertension, Hypertension Reduction -Define heart disease and high blood pressure. Discus how high blood pressure affects the body and ways to reduce high blood pressure.   Exercise and Your Heart -Discuss why it is important to exercise, the FITT principles of exercise, normal and abnormal responses to exercise, and how to exercise safely.   Angina -Discuss definition of angina, causes of angina, treatment of angina, and how to decrease risk of having angina.   Cardiac Medications -Review what the following cardiac medications are used for, how they affect the body, and side effects that may occur when taking the medications.  Medications include Aspirin, Beta blockers, calcium channel blockers, ACE Inhibitors, angiotensin receptor blockers, diuretics, digoxin, and antihyperlipidemics.   Congestive Heart Failure -Discuss the definition of CHF, how to live with CHF, the signs and symptoms of CHF, and how keep track of weight and sodium intake.   Heart Disease and Intimacy -Discus the effect sexual activity has on the heart, how changes occur during intimacy as we age, and safety during sexual activity.   Smoking Cessation / COPD -Discuss different methods to quit smoking, the health benefits of quitting smoking, and the definition of COPD.   Nutrition I: Fats -Discuss the types of cholesterol, what cholesterol does to the heart, and how cholesterol levels can be controlled.   Nutrition II: Labels -Discuss the  different components of food labels and how to read food label   Heart Parts/Heart Disease and PAD -Discuss the anatomy of the heart, the pathway of blood circulation through the heart, and these are affected by heart disease.   Stress I: Signs and Symptoms -Discuss the causes of stress, how stress may lead to anxiety and depression, and ways to limit stress.   Stress II: Relaxation -Discuss different types of relaxation techniques to limit stress.   Warning Signs of Stroke / TIA -Discuss definition of a stroke, what the signs and symptoms are of a stroke, and how to identify when someone is having stroke.   Knowledge Questionnaire Score:  Knowledge Questionnaire Score - 08/24/20 1152  Knowledge Questionnaire Score   Pre Score 19/24           Core Components/Risk Factors/Patient Goals at Admission:  Personal Goals and Risk Factors at Admission - 08/24/20 1153      Core Components/Risk Factors/Patient Goals on Admission    Weight Management Yes;Obesity;Weight Loss    Admit Weight 177 lb 0.5 oz (80.3 kg)    Expected Outcomes Short Term: Continue to assess and modify interventions until short term weight is achieved;Long Term: Adherence to nutrition and physical activity/exercise program aimed toward attainment of established weight goal;Weight Maintenance: Understanding of the daily nutrition guidelines, which includes 25-35% calories from fat, 7% or less cal from saturated fats, less than 200mg  cholesterol, less than 1.5gm of sodium, & 5 or more servings of fruits and vegetables daily;Weight Loss: Understanding of general recommendations for a balanced deficit meal plan, which promotes 1-2 lb weight loss per week and includes a negative energy balance of [phone] kcal/d;Understanding recommendations for meals to include 15-35% energy as protein, 25-35% energy from fat, 35-60% energy from carbohydrates, less than 200mg  of dietary cholesterol, 20-35 gm of total fiber  daily;Understanding of distribution of calorie intake throughout the day with the consumption of 4-5 meals/snacks    Diabetes Yes    Intervention Provide education about signs/symptoms and action to take for hypo/hyperglycemia.;Provide education about proper nutrition, including hydration, and aerobic/resistive exercise prescription along with prescribed medications to achieve blood glucose in normal ranges: Fasting glucose 65-99 mg/dL    Expected Outcomes Short Term: Participant verbalizes understanding of the signs/symptoms and immediate care of hyper/hypoglycemia, proper foot care and importance of medication, aerobic/resistive exercise and nutrition plan for blood glucose control.;Long Term: Attainment of HbA1C < 7%.    Hypertension Yes    Intervention Provide education on lifestyle modifcations including regular physical activity/exercise, weight management, moderate sodium restriction and increased consumption of fresh fruit, vegetables, and low fat dairy, alcohol moderation, and smoking cessation.;Monitor prescription use compliance.    Expected Outcomes Short Term: Continued assessment and intervention until BP is < 140/89mm HG in hypertensive participants. < 130/29mm HG in hypertensive participants with diabetes, heart failure or chronic kidney disease.;Long Term: Maintenance of blood pressure at goal levels.    Lipids Yes    Intervention Provide education and support for participant on nutrition & aerobic/resistive exercise along with prescribed medications to achieve LDL 70mg , HDL >40mg .    Expected Outcomes Short Term: Participant states understanding of desired cholesterol values and is compliant with medications prescribed. Participant is following exercise prescription and nutrition guidelines.;Long Term: Cholesterol controlled with medications as prescribed, with individualized exercise RX and with personalized nutrition plan. Value goals: LDL < 70mg , HDL > 40 mg.    Stress Yes     Intervention Offer individual and/or small group education and counseling on adjustment to heart disease, stress management and health-related lifestyle change. Teach and support self-help strategies.;Refer participants experiencing significant psychosocial distress to appropriate mental health specialists for further evaluation and treatment. When possible, include family members and significant others in education/counseling sessions.    Expected Outcomes Short Term: Participant demonstrates changes in health-related behavior, relaxation and other stress management skills, ability to obtain effective social support, and compliance with psychotropic medications if prescribed.;Long Term: Emotional wellbeing is indicated by absence of clinically significant psychosocial distress or social isolation.           Core Components/Risk Factors/Patient Goals Review:    Core Components/Risk Factors/Patient Goals at Discharge (Final Review):    ITP Comments:  ITP  Comments    Row Name 08/24/20 0940           ITP Comments Dr Armanda Magic MD, Medical Director              Comments: Latifa attended orientation on 08/24/2020 to review rules and guidelines for program.  Completed 6 minute walk test, Intitial ITP, and exercise prescription.  VSS. Telemetry-Sinus Rhythm.  Asymptomatic. Safety measures and social distancing in place per CDC guidelines.Gladstone Lighter, RN,BSN 08/24/2020 12:19 PM

## 2020-08-25 ENCOUNTER — Ambulatory Visit (HOSPITAL_COMMUNITY): Payer: BC Managed Care – PPO

## 2020-08-27 ENCOUNTER — Ambulatory Visit (HOSPITAL_COMMUNITY): Payer: BC Managed Care – PPO

## 2020-08-30 ENCOUNTER — Other Ambulatory Visit: Payer: Self-pay

## 2020-08-30 ENCOUNTER — Encounter (HOSPITAL_COMMUNITY)
Admission: RE | Admit: 2020-08-30 | Discharge: 2020-08-30 | Disposition: A | Discharge status: Home / Self Care | Payer: BC Managed Care – PPO | Source: Ambulatory Visit | Attending: Internal Medicine | Admitting: Internal Medicine

## 2020-08-30 ENCOUNTER — Ambulatory Visit (HOSPITAL_COMMUNITY): Payer: BC Managed Care – PPO

## 2020-08-30 DIAGNOSIS — I214 Non-ST elevation (NSTEMI) myocardial infarction: Secondary | ICD-10-CM | POA: Insufficient documentation

## 2020-08-30 DIAGNOSIS — Z955 Presence of coronary angioplasty implant and graft: Secondary | ICD-10-CM | POA: Insufficient documentation

## 2020-08-30 LAB — GLUCOSE, CAPILLARY
Glucose-Capillary: 218 mg/dL — ABNORMAL HIGH (ref 70–99)
Glucose-Capillary: 207 mg/dL — ABNORMAL HIGH (ref 70–99)

## 2020-08-30 NOTE — Progress Notes (Signed)
Daily Session Note  Patient Details  Name: Sarah Kane MRN: [id number] Date of Birth: Dec 28, 1970 Referring Provider:     [place] from 08/24/2020 in [place]  Referring Provider Sarah Records, MD      Encounter Date: 08/30/2020  Check In:  Session Check In - 08/30/20 0919      Check-In   Supervising physician immediately available to respond to emergencies [facility] immediately available    Physician(s) Dr. Cyndia Skeeters    Location [facility]    Staff Present Lesly Rubenstein, MS, EP-C, CCRP;Joal Eakle, RN, Deland Pretty, MS, ACSM CEP, Exercise Physiologist;Portia Rollene Rotunda, RN, BSN    Virtual Visit No    Medication changes reported     No    Fall or balance concerns reported    No    Tobacco Cessation No Change    Warm-up and Cool-down Performed on first and last piece of equipment    Resistance Training Performed Yes    VAD Patient? No    PAD/SET Patient? No      Pain Assessment   Currently in Pain? No/denies    Multiple Pain Sites No           Capillary Blood Glucose: Results for orders placed or performed during the hospital encounter of 08/30/20 (from the past 24 hour(s))  Glucose, capillary     Status: Abnormal   Collection Time: 08/30/20  9:10 AM  Result Value Ref Range   Glucose-Capillary 218 (H) 70 - 99 mg/dL  Glucose, capillary     Status: Abnormal   Collection Time: 08/30/20 10:18 AM  Result Value Ref Range   Glucose-Capillary 207 (H) 70 - 99 mg/dL      Social History   Tobacco Use  Smoking Status Never Smoker  Smokeless Tobacco Never Used    Goals Met:  Exercise tolerated well No report of cardiac concerns or symptoms Strength training completed today  Goals Unmet:  Not Applicable  Comments: Sarah Kane started cardiac rehab today.  Pt tolerated light exercise without difficulty. VSS, telemetry-Sinus Rhythm, asymptomatic.  Medication list reconciled. Pt denies  barriers to medicaiton compliance.  PSYCHOSOCIAL ASSESSMENT:  PHQ-1. Sarah Kane admits to having stress as she says she does not have the support she would like from her children. Sarah Kane helps watch her granddaughter in [place] and works third shift .    Pt enjoys spending time with her granddaughter.   Pt oriented to exercise equipment and routine.    Understanding verbalized.Sarah Pall, RN,BSN 08/31/2020 9:59 AM   Dr. Fransico Him is Medical Director for Cardiac Rehab at [facility]

## 2020-09-01 ENCOUNTER — Other Ambulatory Visit: Payer: Self-pay

## 2020-09-01 ENCOUNTER — Ambulatory Visit (HOSPITAL_COMMUNITY): Payer: BC Managed Care – PPO

## 2020-09-01 ENCOUNTER — Encounter (HOSPITAL_COMMUNITY)
Admission: RE | Admit: 2020-09-01 | Discharge: 2020-09-01 | Disposition: A | Discharge status: Home / Self Care | Payer: BC Managed Care – PPO | Source: Ambulatory Visit | Attending: Internal Medicine | Admitting: Internal Medicine

## 2020-09-01 DIAGNOSIS — Z955 Presence of coronary angioplasty implant and graft: Secondary | ICD-10-CM

## 2020-09-01 DIAGNOSIS — I214 Non-ST elevation (NSTEMI) myocardial infarction: Secondary | ICD-10-CM

## 2020-09-01 LAB — GLUCOSE, CAPILLARY
Glucose-Capillary: 193 mg/dL — ABNORMAL HIGH (ref 70–99)
Glucose-Capillary: 200 mg/dL — ABNORMAL HIGH (ref 70–99)

## 2020-09-01 NOTE — Progress Notes (Signed)
Sarah Kane 49 y.o. female Nutrition Note  05/20/20 NSTEMI, DES x2 RCA, 07/22/20 DES CFX  Past Medical History:  Diagnosis Date  . Arthritis   . Diabetes mellitus without complication (HCC)   . Hyperlipidemia   . Hypertension      Medications reviewed.   Current Outpatient Medications:  .  acetaminophen (TYLENOL) 500 MG tablet, Take 1,000 mg by mouth every 6 (six) hours as needed for mild pain or headache. , Disp: , Rfl:  .  aspirin 81 MG EC tablet, Take 1 tablet (81 mg total) by mouth daily. Swallow whole., Disp: 30 tablet, Rfl: 11 .  atorvastatin (LIPITOR) 80 MG tablet, Take 1 tablet (80 mg total) by mouth daily., Disp: 90 tablet, Rfl: 3 .  escitalopram (LEXAPRO) 10 MG tablet, Take 10 mg by mouth daily. , Disp: , Rfl:  .  gabapentin (NEURONTIN) 300 MG capsule, TAKE 1 CAPSULE BY MOUTH THREE TIMES DAILY, Disp: 270 capsule, Rfl: 3 .  glimepiride (AMARYL) 4 MG tablet, TAKE 2 TABLETS BY MOUTH ONCE DAILY WITH BREAKFAST, Disp: 180 tablet, Rfl: 1 .  isosorbide mononitrate (IMDUR) 30 MG 24 hr tablet, Take 1.5 tablets (45 mg total) by mouth daily., Disp: 135 tablet, Rfl: 3 .  metoprolol succinate (TOPROL-XL) 25 MG 24 hr tablet, Take 1 tablet (25 mg total) by mouth 2 (two) times daily with a meal., Disp: 180 tablet, Rfl: 3 .  Multiple Vitamins-Minerals (MULTIVITAMIN WOMEN PO), Take 1 tablet by mouth 3 (three) times a week. , Disp: , Rfl:  .  OZEMPIC, 1 MG/DOSE, 4 MG/3ML SOPN, INJECT 1 MG INTO THE SKIN  ONCE A WEEK (Patient not taking: Reported on 08/24/2020), Disp: 3 mL, Rfl: 0 .  sitaGLIPtin (JANUVIA) 100 MG tablet, Take 1 tablet (100 mg total) by mouth daily., Disp: 90 tablet, Rfl: 1 .  ticagrelor (BRILINTA) 90 MG TABS tablet, Take 1 tablet (90 mg total) by mouth 2 (two) times daily., Disp: 60 tablet, Rfl: 11 .  Vitamin D, Ergocalciferol, (DRISDOL) 1.25 MG (50000 UNIT) CAPS capsule, Take 1 capsule by mouth once a week, Disp: 12 capsule, Rfl: 3   Ht Readings from Last 1 Encounters:  08/24/20  5' 0.75" (1.543 m)     Wt Readings from Last 3 Encounters:  08/24/20 177 lb 0.5 oz (80.3 kg)  08/10/20 178 lb (80.7 kg)  07/22/20 173 lb (78.5 kg)     There is no height or weight on file to calculate BMI.   Social History   Tobacco Use  Smoking Status Never Smoker  Smokeless Tobacco Never Used     Lab Results  Component Value Date   CHOL 135 06/18/2020   Lab Results  Component Value Date   HDL 49 06/18/2020   Lab Results  Component Value Date   LDLCALC 65 06/18/2020   Lab Results  Component Value Date   TRIG 118 06/18/2020     Lab Results  Component Value Date   HGBA1C 8.0 (H) 05/20/2020     CBG (last 3)  Recent Labs    08/30/20 1018 09/01/20 0908 09/01/20 0959  GLUCAP 207* 193* 200*     Nutrition Note  Spoke with pt. Nutrition Plan and Nutrition Survey goals reviewed with pt.  Pt feels her diet could be improved. She is interested in learning basic cooking skills to help increase vegetable intake.  Sarah Kane works third shift at [employer] and [employer] during the day. Normal sleep time 3 pm - 9 pm.  Pt has Type  2 Diabetes. This Clinical research associate went over Diabetes Education test results.  Pt does not check CBGs. She works third shift and eats sporadically and is not sure how checking CBGs would be helpful. However, she is interested in trying to figure out a routine  Unable to obtain diet recall as patient does not eat on consistent schedule. She snacks on cheetos or pringles at work at night. Sometimes she gets a slim jim or boiled eggs and banana for "lunch break". She drinks water, diet coke, and almond or skim milk. Sometimes she cooks a meal in the evening. Recently she has been eating meals from "[employer]".  She reads labels and watches carbs and sugars. We reviewed label reading. Reviewed glycemic targets. Pt expressed understanding of the information reviewed.   Nutrition Diagnosis ? Excessive carbohydrate intake related to food  preferences and lack of food related knowledge as evidenced by diet recall of refined carbohydrates for snacks and meals and A1C 8.0 and CBGs at rehab >200 mg/dl   Nutrition Intervention ? Pt's individual nutrition plan reviewed with pt. ? Benefits of adopting Heart Healthy diet discussed when Medficts reviewed.   ? Continue client-centered nutrition education by RD, as part of interdisciplinary care.  Goal(s) ? CBG concentrations in the normal range or as close to normal as is safely possible. ? Improved blood glucose control as evidenced by pt's A1c trending from 8.0 toward less than 7.0. ? Pt to incorporate plate method into meal planning for balanced meals and increased nutrient density. Plan:   Will provide client-centered nutrition education as part of interdisciplinary care  Monitor and evaluate progress toward nutrition goal with team.   Andrey Campanile, MS, RDN, LDN

## 2020-09-02 ENCOUNTER — Ambulatory Visit (INDEPENDENT_AMBULATORY_CARE_PROVIDER_SITE_OTHER): Payer: BC Managed Care – PPO | Admitting: Internal Medicine

## 2020-09-02 ENCOUNTER — Encounter: Payer: Self-pay | Admitting: Internal Medicine

## 2020-09-02 VITALS — BP 110/90 | HR 79 | Ht 60.0 in | Wt 178.6 lb

## 2020-09-02 DIAGNOSIS — I2510 Atherosclerotic heart disease of native coronary artery without angina pectoris: Secondary | ICD-10-CM

## 2020-09-02 NOTE — Patient Instructions (Addendum)
Medication Instructions:  No changes *If you need a refill on your cardiac medications before your next appointment, please call your pharmacy*   Lab Work: none If you have labs (blood work) drawn today and your tests are completely normal, you will receive your results only by: [name] MyChart Message (if you have MyChart) OR . A paper copy in the mail If you have any lab test that is abnormal or we need to change your treatment, we will call you to review the results.   Testing/Procedures: none   Follow-Up: At [facility], you and your health needs are our priority.  As part of our continuing mission to provide you with exceptional heart care, we have created designated Provider Care Teams.  These Care Teams include your primary Cardiologist (physician) and Advanced Practice Providers (APPs -  Physician Assistants and Nurse Practitioners) who all work together to provide you with the care you need, when you need it.   Your next appointment:   4 month(s)  The format for your next appointment:   In Person  Provider:   You may see Dietrich Pates, MD or one of the following Advanced Practice Providers on your designated Care Team:    Tereso Newcomer, PA-C  Chelsea Aus, [place]    Other Instructions  Letter composed by Dr. Tenny Craw, faxed to [place] at [phone].

## 2020-09-02 NOTE — Progress Notes (Signed)
Cardiology Office Note   Date:  09/02/2020   ID:  Sarah Kane, DOB [birth year], MRN [id number]  PCP:  Sunnie Nielsen, DO  Cardiologist: Dr. Dietrich Pates  No chief complaint on file.   History of Present Illness: Sarah Kane is a 49 y.o. female who presents for post PCI, seen for Dr. Tenny Craw.  Sarah Kane has a hx of HTN, DM2 and CAD. She had an NSTEMI 04/2020 at which time she underwent LHC that showed diffuse CAD with 30% pLAD, 99% dLAD, 70% prox LCx, 100% mRCA, 70% dRCA. She underwent PCI/DES to RCA with resolution of symptoms. Per chart review, staged PCI could be considered of the LCx if refractory symptoms. Echo at that time showed an LVEF at 45-50%. Losartan was held due to soft BPs. She was seen in follow up 06/03/20 with continued mild chest discomfort. Imdur was added to her regimen.    She was seen by Dr. Tenny Craw 06/18/20 at which time she was not convinced that her pain was angina. Plan was to have her participate in cardiac rehab and evaluate her tolerance. Plan was to keep her out of work until after cardiac rehab complete.   Per Dr. Tenny Craw' note, she was having pain with CR. Phone message from 07/08/20 with persistent chest pain therefore it was recommended for further evaluation with OV.   Repeat call from 07/08/20 notes she continued to have daily chest discomfort since her NSTEMI 04/2020. She was taking medications as prescribed but remained concerned that the discomfort continues. She was unable to take SL NTG due to low BP.  She reported that this may be a chronic problem  She is noted to have significant LCx disease which was not stented on cath from 04/2020. She has been adequately prescribed long acting nitrates, currently taking 30mg  QD.   She was seen by myself for follow up at which time her symptoms had not changed despite the above interventions. She continues to have exertional chest pain in her left anterior chest with radiatation to her left breast and her back.  Case discussed between Dr. and Dr. Tenny Craw at which time plan was for repeat LHC.  She ultimately underwent LHC on 07/22/2020 which showed a proximal LCx lesion at 70% at which time DES/PCI was placed with recommendations for DAPT with ASA and Brilinta for 1 year.  Today she presents and reports she is feeling much better after last PCI. She states that her left breast sensation is now gone and she has had no further chest pain. Her anxiety is improved after starting Lexapro. She has cardiac rehab consultation 11/30 and seems excited. She reports that overall she has been doing very well. She continues to struggle to find "heart healthy" foods but she is trying. She is ready to get back to work. She denies SOB, LE edema, palpitations, PND, orthopnea. She tolerates her medications well.   Since the last intervention she says she feels much better  She no longer has tightness in chest   Breathing is good   Past Medical History:  Diagnosis Date  . Arthritis   . Diabetes mellitus without complication (HCC)   . Hyperlipidemia   . Hypertension     Past Surgical History:  Procedure Laterality Date  . CARDIAC CATHETERIZATION    . CESAREAN SECTION  1990  . CORONARY STENT INTERVENTION N/A 05/20/2020   Procedure: CORONARY STENT INTERVENTION;  Surgeon: 05/22/2020, MD;  Location: [facility];  Service: Cardiovascular;  Laterality: N/A;  . CORONARY STENT INTERVENTION N/A 07/22/2020   Procedure: CORONARY STENT INTERVENTION;  Surgeon: Kathleene Hazel, MD;  Location: [facility];  Service: Cardiovascular;  Laterality: N/A;  . LEFT HEART CATH AND CORONARY ANGIOGRAPHY N/A 05/20/2020   Procedure: LEFT HEART CATH AND CORONARY ANGIOGRAPHY;  Surgeon: Kathleene Hazel, MD;  Location: [facility];  Service: Cardiovascular;  Laterality: N/A;  . TRIGGER FINGER RELEASE Right    thumb     Current Outpatient Medications  Medication Sig Dispense Refill  .  acetaminophen (TYLENOL) 500 MG tablet Take 1,000 mg by mouth every 6 (six) hours as needed for mild pain or headache.     [name] aspirin 81 MG EC tablet Take 1 tablet (81 mg total) by mouth daily. Swallow whole. 30 tablet 11  . atorvastatin (LIPITOR) 80 MG tablet Take 1 tablet (80 mg total) by mouth daily. 90 tablet 3  . escitalopram (LEXAPRO) 10 MG tablet Take 10 mg by mouth daily.     [name] gabapentin (NEURONTIN) 300 MG capsule TAKE 1 CAPSULE BY MOUTH THREE TIMES DAILY 270 capsule 3  . glimepiride (AMARYL) 4 MG tablet TAKE 2 TABLETS BY MOUTH ONCE DAILY WITH BREAKFAST 180 tablet 1  . isosorbide mononitrate (IMDUR) 30 MG 24 hr tablet Take 1.5 tablets (45 mg total) by mouth daily. 135 tablet 3  . metoprolol succinate (TOPROL-XL) 25 MG 24 hr tablet Take 1 tablet (25 mg total) by mouth 2 (two) times daily with a meal. 180 tablet 3  . Multiple Vitamins-Minerals (MULTIVITAMIN WOMEN PO) Take 1 tablet by mouth 3 (three) times a week.     [name] OZEMPIC, 1 MG/DOSE, 4 MG/3ML SOPN INJECT 1 MG INTO THE SKIN  ONCE A WEEK 3 mL 0  . sitaGLIPtin (JANUVIA) 100 MG tablet Take 1 tablet (100 mg total) by mouth daily. 90 tablet 1  . ticagrelor (BRILINTA) 90 MG TABS tablet Take 1 tablet (90 mg total) by mouth 2 (two) times daily. 60 tablet 11  . Vitamin D, Ergocalciferol, (DRISDOL) 1.25 MG (50000 UNIT) CAPS capsule Take 1 capsule by mouth once a week 12 capsule 3   No current facility-administered medications for this visit.    Allergies:   Nitroglycerin    Social History:  The patient  reports that she has never smoked. She has never used smokeless tobacco. She reports that she does not drink alcohol and does not use drugs.   Family History:  The patient's family history includes Diabetes in her maternal aunt, maternal grandfather, maternal grandmother, maternal uncle, and mother; Heart attack in her maternal grandfather; High blood pressure in her maternal grandfather, maternal grandmother, mother, paternal grandfather, and  paternal grandmother; Skin cancer in her maternal uncle.    ROS:  Please see the history of present illness. Otherwise, review of systems are positive for none.   All other systems are reviewed and negative.    PHYSICAL EXAM: VS:  BP 110/90   Pulse 79   Ht 5' (1.524 m)   Wt 178 lb 9.6 oz (81 kg)   SpO2 99%   BMI 34.88 kg/m  , BMI Body mass index is 34.88 kg/m.   General: Well developed, well nourished, NAD Neck: Negative for carotid bruits. No JVD Lungs:Clear to ausculation bilaterally. No wheezes, rales,  Cardiovascular: RRR with S1 S2. No murmurs Extremities: No edema. Radial pulses 2+ bilaterally Neuro: Alert and oriented. No focal deficits. No facial asymmetry. MAE spontaneously. Psych: Responds to questions  appropriately with normal affect.     EKG:  EKG is not ordered today.   Recent Labs: 05/20/2020: TSH 1.494 07/22/2020: BUN 14; Creatinine, Ser 0.86; Hemoglobin 14.5; Platelets 170; Potassium 5.2; Sodium 138    Lipid Panel    Component Value Date/Time   CHOL 135 06/18/2020 1226   TRIG 118 06/18/2020 1226   HDL 49 06/18/2020 1226   CHOLHDL 4.1 05/21/2020 0438   VLDL 30 05/21/2020 0438   LDLCALC 65 06/18/2020 1226   Wt Readings from Last 3 Encounters:  09/02/20 178 lb 9.6 oz (81 kg)  08/24/20 177 lb 0.5 oz (80.3 kg)  08/10/20 178 lb (80.7 kg)    Other studies Reviewed: Additional studies/ records that were reviewed today include:  Review of the above records demonstrates:   Cardiac Catheterization 07/22/2020:   Prox Cx lesion is 70% stenosed.  A drug-eluting stent was successfully placed using a SYNERGY XD 3.0X16.  Post intervention, there is a 0% residual stenosis.  1. Severe stenosis proximal Circumflex artery 2. Successful PTCA/DES x 1 proximal Circumflex  Recommendations: Continue DAPT with ASA and Brilinta for one year.   Diagnostic Dominance: Right  Intervention     ASSESSMENT AND PLAN:  1. Chest pain with know CAD s/p recent  PCI and known residual LCx disease: -Underwent LHC with PCI to RCA 04/2020>>placed on ASA and Brilinta -See in f/u with persistent, mild symptoms at which time it was recommended that she participate with cardiac rehab (Imdur added) however this was deferred due to ongoing symptoms -She was eventually referred for repeat LHC on 07/22/2020 which showed a proximal LCx lesion at 70% at which time DES/PCI was placed with recommendations for DAPT with ASA and Brilinta for 1 year -Continue DAPT, statin and beta blocker  Feels good  No tightness   Continue cardiac rehab  2. HLD: -LDL, 65>>>at goal of <70 -Continue high intensity statin  WIll see about getting pt in [facility]   3. DM2: -Uncontrolled, 9.3 in 03/2019 -Currently on Januvia  -Needs close follow up with PCP   4. HTN:  -Diastolic mildly increased  Follow    5. Cardiomyopathy  Mild LV dysfunction on echo   -Per echo with LVEF at 45-50% -   Signed, Dietrich Pates, MD  09/02/2020 10:15 AM    [facility] [address] [place], [place], [place]  [address] Phone: [phone]; Fax: [phone]

## 2020-09-03 ENCOUNTER — Other Ambulatory Visit: Payer: Self-pay

## 2020-09-03 ENCOUNTER — Encounter (HOSPITAL_COMMUNITY)
Admission: RE | Admit: 2020-09-03 | Discharge: 2020-09-03 | Disposition: A | Discharge status: Home / Self Care | Payer: BC Managed Care – PPO | Source: Ambulatory Visit | Attending: Internal Medicine | Admitting: Internal Medicine

## 2020-09-03 ENCOUNTER — Ambulatory Visit (HOSPITAL_COMMUNITY): Payer: BC Managed Care – PPO

## 2020-09-03 DIAGNOSIS — Z955 Presence of coronary angioplasty implant and graft: Secondary | ICD-10-CM

## 2020-09-03 DIAGNOSIS — I214 Non-ST elevation (NSTEMI) myocardial infarction: Secondary | ICD-10-CM | POA: Diagnosis not present

## 2020-09-03 LAB — GLUCOSE, CAPILLARY: Glucose-Capillary: 270 mg/dL — ABNORMAL HIGH (ref 70–99)

## 2020-09-06 ENCOUNTER — Other Ambulatory Visit: Payer: Self-pay | Admitting: Nurse Practitioner

## 2020-09-06 ENCOUNTER — Other Ambulatory Visit: Payer: Self-pay | Admitting: Osteopathic Medicine

## 2020-09-06 ENCOUNTER — Other Ambulatory Visit: Payer: Self-pay

## 2020-09-06 ENCOUNTER — Encounter (HOSPITAL_COMMUNITY)
Admission: RE | Admit: 2020-09-06 | Discharge: 2020-09-06 | Disposition: A | Discharge status: Home / Self Care | Payer: BC Managed Care – PPO | Source: Ambulatory Visit | Attending: Internal Medicine | Admitting: Internal Medicine

## 2020-09-06 DIAGNOSIS — E1165 Type 2 diabetes mellitus with hyperglycemia: Secondary | ICD-10-CM

## 2020-09-06 DIAGNOSIS — Z955 Presence of coronary angioplasty implant and graft: Secondary | ICD-10-CM

## 2020-09-06 DIAGNOSIS — I214 Non-ST elevation (NSTEMI) myocardial infarction: Secondary | ICD-10-CM

## 2020-09-06 NOTE — Progress Notes (Signed)
QUALITY OF LIFE SCORE REVIEW  Pt completed Quality of Life survey as a participant in Cardiac Rehab. Scores 21.0 or below are considered low. Pt score very low in several areas Overall 17.39, Health and Function 19.96, socioeconomic 19.43, physiological and spiritual 15.36, family 9.0. Patient quality of life slightly altered by physical constraints which limits ability to perform as prior to recent cardiac illness. 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 Sarah Kane's two adult sons are currently living with her.  Offered emotional support and reassurance.  Will continue to monitor and intervene as necessary.  Sarah Kane is currently taking an antidepressant and does not want her quality of life forwarded to her primary care physician at this time.  Sarah Kane says that she has received counseling previously through her church and is not interested at this time. Reviewed stress management techniques with the patient.Gladstone Lighter, RN,BSN 09/06/2020 10:23 AM

## 2020-09-06 NOTE — Progress Notes (Addendum)
Nutrition Note Pt works 3rd shift and sleeps during the day.  Pt woke up at 8 pm with CBG 94 mg/dl with low CBG symptoms (shakiness, sweating). She had taken meds yesterday morning and did not eat anything before going to bed 2 pm-8 pm. She treated it by eating hard candy. Symptoms resolved. Reviewed hypoglycemia protocol.   Recommended eating every 3-4 hours to reduce risk of hypoglycemia due to pt taking sulfonylurea. Provided individualized meal timing recommendations. Will continue to monitor pt during cardiac rehab.  Andrey Campanile, MS, RDN, LDN

## 2020-09-07 ENCOUNTER — Encounter: Payer: Self-pay | Admitting: Osteopathic Medicine

## 2020-09-07 NOTE — Progress Notes (Signed)
Cardiac Individual Treatment Plan  Patient Details  Name: Sarah Kane MRN: [id number] Date of Birth: 1971/08/17 Referring Provider:   Flowsheet Row CARDIAC REHAB PHASE II ORIENTATION from 08/24/2020 in [facility] CARDIAC REHAB  Referring Provider Pricilla Riffle, MD      Initial Encounter Date:  Flowsheet Row CARDIAC REHAB PHASE II ORIENTATION from 08/24/2020 in [facility] CARDIAC REHAB  Date 08/24/20      Visit Diagnosis: NSTEMI (non-ST elevated myocardial infarction) [facility]) 05/20/20  05/20/20 S/P DESx 2 RCA , 07/22/20 S/P DES Cx  Patient's Home Medications on Admission:  Current Outpatient Medications:    acetaminophen (TYLENOL) 500 MG tablet, Take 1,000 mg by mouth every 6 (six) hours as needed for mild pain or headache. , Disp: , Rfl:    aspirin 81 MG EC tablet, Take 1 tablet (81 mg total) by mouth daily. Swallow whole., Disp: 30 tablet, Rfl: 11   atorvastatin (LIPITOR) 80 MG tablet, Take 1 tablet (80 mg total) by mouth daily., Disp: 90 tablet, Rfl: 3   escitalopram (LEXAPRO) 10 MG tablet, Take 1 tablet by mouth once daily, Disp: 30 tablet, Rfl: 0   gabapentin (NEURONTIN) 300 MG capsule, TAKE 1 CAPSULE BY MOUTH THREE TIMES DAILY, Disp: 270 capsule, Rfl: 3   glimepiride (AMARYL) 4 MG tablet, TAKE 2 TABLETS BY MOUTH ONCE DAILY WITH BREAKFAST, Disp: 180 tablet, Rfl: 1   isosorbide mononitrate (IMDUR) 30 MG 24 hr tablet, Take 1.5 tablets (45 mg total) by mouth daily., Disp: 135 tablet, Rfl: 3   metoprolol succinate (TOPROL-XL) 25 MG 24 hr tablet, Take 1 tablet (25 mg total) by mouth 2 (two) times daily with a meal., Disp: 180 tablet, Rfl: 3   Multiple Vitamins-Minerals (MULTIVITAMIN WOMEN PO), Take 1 tablet by mouth 3 (three) times a week. , Disp: , Rfl:    OZEMPIC, 1 MG/DOSE, 4 MG/3ML SOPN, INJECT 1 MG INTO THE SKIN  ONCE A WEEK, Disp: 3 mL, Rfl: 0   sitaGLIPtin (JANUVIA) 100 MG tablet, Take 1 tablet (100 mg total) by mouth daily., Disp: 90  tablet, Rfl: 1   ticagrelor (BRILINTA) 90 MG TABS tablet, Take 1 tablet (90 mg total) by mouth 2 (two) times daily., Disp: 60 tablet, Rfl: 11   Vitamin D, Ergocalciferol, (DRISDOL) 1.25 MG (50000 UNIT) CAPS capsule, Take 1 capsule by mouth once a week, Disp: 12 capsule, Rfl: 3  Past Medical History: Past Medical History:  Diagnosis Date   Arthritis    Diabetes mellitus without complication (HCC)    Hyperlipidemia    Hypertension     Tobacco Use: Social History   Tobacco Use  Smoking Status Never Smoker  Smokeless Tobacco Never Used    Labs: Recent Review Flowsheet Data    Labs for ITP Cardiac and Pulmonary Rehab Latest Ref Rng & Units 07/23/2019 10/23/2019 05/20/2020 05/21/2020 06/18/2020   Cholestrol 100 - 199 mg/dL - - - 409 811   LDLCALC 0 - 99 mg/dL - - - 914(N) 65   HDL >82 mg/dL - - - 42 49   Trlycerides 0 - 149 mg/dL - - - 956 213   Hemoglobin A1c 4.8 - 5.6 % 7.8(A) 7.8(A) 8.0(H) - -      Capillary Blood Glucose: Lab Results  Component Value Date   GLUCAP 270 (H) 09/03/2020   GLUCAP 200 (H) 09/01/2020   GLUCAP 193 (H) 09/01/2020   GLUCAP 207 (H) 08/30/2020   GLUCAP 218 (H) 08/30/2020     Exercise Target  Goals: Exercise Program Goal: Individual exercise prescription set using results from initial 6 min walk test and THRR while considering  patients activity barriers and safety.   Exercise Prescription Goal: Starting with aerobic activity 30 plus minutes a day, 3 days per week for initial exercise prescription. Provide home exercise prescription and guidelines that participant acknowledges understanding prior to discharge.  Activity Barriers & Risk Stratification:  Activity Barriers & Cardiac Risk Stratification - 08/24/20 1155      Activity Barriers & Cardiac Risk Stratification   Activity Barriers Arthritis;Joint Problems;Muscular Weakness;Other (comment)    Comments Bilateral torn rotaor cuffs, left shoulder frozen. Upperbody weakness due to shoulders     Cardiac Risk Stratification High           6 Minute Walk:  6 Minute Walk    Row Name 08/24/20 1000         6 Minute Walk   Phase Initial     Distance 1261 feet     Walk Time 6 minutes     # of Rest Breaks 0     MPH 2.4     METS 3.4     RPE 11     Perceived Dyspnea  0     VO2 Peak 11.98     Symptoms No     Resting HR 84 bpm     Resting BP 108/78     Resting Oxygen Saturation  99 %     Exercise Oxygen Saturation  during 6 min walk 98 %     Max Ex. HR 97 bpm     Max Ex. BP 124/80     2 Minute Post BP 114/68            Oxygen Initial Assessment:   Oxygen Re-Evaluation:   Oxygen Discharge (Final Oxygen Re-Evaluation):   Initial Exercise Prescription:  Initial Exercise Prescription - 08/24/20 1100      Date of Initial Exercise RX and Referring Provider   Date 08/24/20    Referring Provider Pricilla Riffle, MD    Expected Discharge Date 09/22/20      NuStep   Level 2    SPM 85    Minutes 15    METs 2      Track   Laps 10    Minutes 15    METs 2.16      Prescription Details   Frequency (times per week) 3    Duration Progress to 30 minutes of continuous aerobic without signs/symptoms of physical distress      Intensity   THRR 40-80% of Max Heartrate 68-136    Ratings of Perceived Exertion 11-13    Perceived Dyspnea 0-4      Progression   Progression Continue progressive overload as per policy without signs/symptoms or physical distress.      Resistance Training   Training Prescription Yes    Weight 2 lbs    Reps 10-15           Perform Capillary Blood Glucose checks as needed.  Exercise Prescription Changes:   Exercise Prescription Changes    Row Name 08/30/20 1100             Response to Exercise   Blood Pressure (Admit) 102/82       Blood Pressure (Exercise) 122/74       Blood Pressure (Exit) 100/68       Heart Rate (Admit) 86 bpm       Heart Rate (Exercise) 102 bpm  Heart Rate (Exit) 86 bpm       Rating of  Perceived Exertion (Exercise) 13       Perceived Dyspnea (Exercise) 0       Symptoms Fatigue with walking track, required rest break       Comments Pt's first day of exercise in the CRP2 program       Duration Progress to 30 minutes of  aerobic without signs/symptoms of physical distress       Intensity THRR unchanged               Progression   Progression Continue to progress workloads to maintain intensity without signs/symptoms of physical distress.       Average METs 2               Resistance Training   Training Prescription Yes       Weight 2 lbs       Reps 10-15       Time 10 Minutes               Interval Training   Interval Training No               NuStep   Level 2       SPM 75       Minutes 15       METs 1.8               Track   Laps 10       Minutes 15       METs 2.16              Exercise Comments:   Exercise Comments    Row Name 08/30/20 1246           Exercise Comments Pt's first day of exercise in the CRP2 program. Tolerated exercise session with only complaint of fatigue while walking the track.              Exercise Goals and Review:   Exercise Goals    Row Name 08/24/20 1201             Exercise Goals   Increase Physical Activity Yes       Intervention Provide advice, education, support and counseling about physical activity/exercise needs.;Develop an individualized exercise prescription for aerobic and resistive training based on initial evaluation findings, risk stratification, comorbidities and participant's personal goals.       Expected Outcomes Short Term: Attend rehab on a regular basis to increase amount of physical activity.;Long Term: Add in home exercise to make exercise part of routine and to increase amount of physical activity.;Long Term: Exercising regularly at least 3-5 days a week.       Increase Strength and Stamina Yes       Intervention Provide advice, education, support and counseling about physical  activity/exercise needs.;Develop an individualized exercise prescription for aerobic and resistive training based on initial evaluation findings, risk stratification, comorbidities and participant's personal goals.       Expected Outcomes Short Term: Increase workloads from initial exercise prescription for resistance, speed, and METs.;Short Term: Perform resistance training exercises routinely during rehab and add in resistance training at home;Long Term: Improve cardiorespiratory fitness, muscular endurance and strength as measured by increased METs and functional capacity ( )       Able to understand and use rate of perceived exertion (RPE) scale Yes       Intervention Provide education and explanation on how to  use RPE scale       Expected Outcomes Short Term: Able to use RPE daily in rehab to express subjective intensity level;Long Term:  Able to use RPE to guide intensity level when exercising independently       Knowledge and understanding of Target Heart Rate Range (THRR) Yes       Intervention Provide education and explanation of THRR including how the numbers were predicted and where they are located for reference       Expected Outcomes Short Term: Able to state/look up THRR;Long Term: Able to use THRR to govern intensity when exercising independently;Short Term: Able to use daily as guideline for intensity in rehab       Understanding of Exercise Prescription Yes       Intervention Provide education, explanation, and written materials on patient's individual exercise prescription       Expected Outcomes Short Term: Able to explain program exercise prescription;Long Term: Able to explain home exercise prescription to exercise independently              Exercise Goals Re-Evaluation :  Exercise Goals Re-Evaluation    Row Name 08/30/20 1243             Exercise Goal Re-Evaluation   Exercise Goals Review Increase Physical Activity;Increase Strength and Stamina;Able to understand  and use rate of perceived exertion (RPE) scale;Knowledge and understanding of Target Heart Rate Range (THRR);Understanding of Exercise Prescription       Comments Pt's first day of exercise in the CRP2 program. Pt did c/o fatigue when walking and did take rest break. No other complaints voiced. Pt understands RPE scale, THRR, and Exercise Rx.       Expected Outcomes Will continue to monitor patient and progress exercise workloads as tolerated.               Discharge Exercise Prescription (Final Exercise Prescription Changes):  Exercise Prescription Changes - 08/30/20 1100      Response to Exercise   Blood Pressure (Admit) 102/82    Blood Pressure (Exercise) 122/74    Blood Pressure (Exit) 100/68    Heart Rate (Admit) 86 bpm    Heart Rate (Exercise) 102 bpm    Heart Rate (Exit) 86 bpm    Rating of Perceived Exertion (Exercise) 13    Perceived Dyspnea (Exercise) 0    Symptoms Fatigue with walking track, required rest break    Comments Pt's first day of exercise in the CRP2 program    Duration Progress to 30 minutes of  aerobic without signs/symptoms of physical distress    Intensity THRR unchanged      Progression   Progression Continue to progress workloads to maintain intensity without signs/symptoms of physical distress.    Average METs 2      Resistance Training   Training Prescription Yes    Weight 2 lbs    Reps 10-15    Time 10 Minutes      Interval Training   Interval Training No      NuStep   Level 2    SPM 75    Minutes 15    METs 1.8      Track   Laps 10    Minutes 15    METs 2.16           Nutrition:  Target Goals: Understanding of nutrition guidelines, daily intake of sodium 1500mg , cholesterol 200mg , calories 30% from fat and 7% or less from saturated fats, daily to have  5 or more servings of fruits and vegetables.  Biometrics:  Pre Biometrics - 08/24/20 0900      Pre Biometrics   Waist Circumference 45 inches    Hip Circumference 46.5  inches    Waist to Hip Ratio 0.97 %    Triceps Skinfold 41 mm    % Body Fat 47.3 %    Grip Strength 30 kg    Flexibility 0 in    Single Leg Stand 10.56 seconds            Nutrition Therapy Plan and Nutrition Goals:  Nutrition Therapy & Goals - 09/03/20 1541      Nutrition Therapy   Diet TLC, Carb mod    Drug/Food Interactions Statins/Certain Fruits      Personal Nutrition Goals   Nutrition Goal Improved blood glucose control as evidenced by pt's A1c trending from 8.0 toward less than 7.0.    Personal Goal #2 CBG concentrations in the normal range or as close to normal as is safely possible.    Personal Goal #3 Pt to incorporate plate method into meal planning for balanced meals and increased nutrient density.      Intervention Plan   Intervention Prescribe, educate and counsel regarding individualized specific dietary modifications aiming towards targeted core components such as weight, hypertension, lipid management, diabetes, heart failure and other comorbidities.    Expected Outcomes Short Term Goal: A plan has been developed with personal nutrition goals set during dietitian appointment.;Long Term Goal: Adherence to prescribed nutrition plan.           Nutrition Assessments:  MEDIFICTS Score Key:  ?70 Need to make dietary changes   40-70 Heart Healthy Diet  ? 40 Therapeutic Level Cholesterol Diet  Flowsheet Row CARDIAC REHAB PHASE II EXERCISE from 09/01/2020 in [facility] CARDIAC REHAB  Picture Your Plate Total Score on Admission 53     Picture Your Plate Scores:  <29 Unhealthy dietary pattern with much room for improvement.  41-50 Dietary pattern unlikely to meet recommendations for good health and room for improvement.  51-60 More healthful dietary pattern, with some room for improvement.   >60 Healthy dietary pattern, although there may be some specific behaviors that could be improved.    Nutrition Goals Re-Evaluation:  Nutrition  Goals Re-Evaluation    Row Name 09/03/20 1542             Goals   Current Weight 178 lb (80.7 kg)       Nutrition Goal Improved blood glucose control as evidenced by pt's A1c trending from 8.0 toward less than 7.0.               Personal Goal #2 Re-Evaluation   Personal Goal #2 CBG concentrations in the normal range or as close to normal as is safely possible.               Personal Goal #3 Re-Evaluation   Personal Goal #3 Pt to incorporate plate method into meal planning for balanced meals and increased nutrient density.              Nutrition Goals Discharge (Final Nutrition Goals Re-Evaluation):  Nutrition Goals Re-Evaluation - 09/03/20 1542      Goals   Current Weight 178 lb (80.7 kg)    Nutrition Goal Improved blood glucose control as evidenced by pt's A1c trending from 8.0 toward less than 7.0.      Personal Goal #2 Re-Evaluation   Personal Goal #2 CBG  concentrations in the normal range or as close to normal as is safely possible.      Personal Goal #3 Re-Evaluation   Personal Goal #3 Pt to incorporate plate method into meal planning for balanced meals and increased nutrient density.           Psychosocial: Target Goals: Acknowledge presence or absence of significant depression and/or stress, maximize coping skills, provide positive support system. Participant is able to verbalize types and ability to use techniques and skills needed for reducing stress and depression.  Initial Review & Psychosocial Screening:  Initial Psych Review & Screening - 08/24/20 0941      Initial Review   Current issues with Current Stress Concerns;History of Depression;Current Anxiety/Panic    Source of Stress Concerns Chronic Illness;Family;Occupation    Comments Leoni has a lot of family stressors her son just came to live with her who has problems with substance addiction. Tamlyn just lost her birth and biological parents in the past year      Family Dynamics   Good Support  System? 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      Screening Interventions   Interventions Encouraged to exercise;To provide support and resources with identified psychosocial needs;Provide feedback about the scores to participant    Expected Outcomes Short Term goal: Utilizing psychosocial counselor, staff and physician to assist with identification of specific Stressors or current issues interfering with healing process. Setting desired goal for each stressor or current issue identified.;Long Term Goal: Stressors or current issues are controlled or eliminated.;Short Term goal: Identification and review with participant of any Quality of Life or Depression concerns found by scoring the questionnaire.;Long Term goal: The participant improves quality of Life and PHQ9 Scores as seen by post scores and/or verbalization of changes           Quality of Life Scores:  Quality of Life - 08/24/20 1146      Quality of Life   Select Quality of Life      Quality of Life Scores   Health/Function Pre 19.96 %    Socioeconomic Pre 19.43 %    Psych/Spiritual Pre 15.36 %    Family Pre 9 %    GLOBAL Pre 17.39 %          Scores of 19 and below usually indicate a poorer quality of life in these areas.  A difference of  2-3 points is a clinically meaningful difference.  A difference of 2-3 points in the total score of the Quality of Life Index has been associated with significant improvement in overall quality of life, self-image,  physical symptoms, and general health in studies assessing change in quality of life.  PHQ-9: Recent Review Flowsheet Data    Depression screen [facility] 2/9 08/24/2020 11/06/2019 07/23/2019 04/22/2019 10/29/2018   Decreased Interest 0 3 3 3 3    Down, Depressed, Hopeless 1  3 3 3 2    PHQ - 2 Score 1 6 6 6 5    Altered sleeping - 3  3 3 3    Tired, decreased energy - 3 3 3 3    Change in appetite - 3 3 3 3    Feeling bad or failure about yourself  - 3 3 2 2    Trouble concentrating - 0 0 0 0   Moving slowly or fidgety/restless - 0 3 0 0   Suicidal thoughts - 1 0 0 0   PHQ-9 Score - 19 21 17 16    Difficult doing work/chores - Very difficult - Very difficult Somewhat difficult     Interpretation of Total Score  Total Score Depression Severity:  1-4 = Minimal depression, 5-9 = Mild depression, 10-14 = Moderate depression, 15-19 = Moderately severe depression, 20-27 = Severe depression   Psychosocial Evaluation and Intervention:   Psychosocial Re-Evaluation:  Psychosocial Re-Evaluation    Row Name 09/07/20 0821             Psychosocial Re-Evaluation   Current issues with Current Stress Concerns;History of Depression;Current Anxiety/Panic       Comments Jesika has signifigant stress regarding health and family.       Expected Outcomes Patient will find ways to manage stress. Declined the need for counseling currently       Interventions Encouraged to attend Cardiac Rehabilitation for the exercise;Stress management education;Relaxation education       Continue Psychosocial Services  Follow up required by staff       Comments Srah has a lot of family stressors her son just came to live with her who has problems with substance addiction. Simon just lost her birth and biological parents in the past year               Initial Review   Source of Stress Concerns Chronic Illness;Family;Occupation              Psychosocial Discharge (Final Psychosocial Re-Evaluation):  Psychosocial Re-Evaluation  - 09/07/20 0821      Psychosocial Re-Evaluation   Current issues with Current Stress Concerns;History of Depression;Current Anxiety/Panic    Comments Wannetta has signifigant stress regarding health and family.    Expected Outcomes Patient will find ways to manage stress. Declined the need for counseling currently    Interventions Encouraged to attend Cardiac Rehabilitation for the exercise;Stress management education;Relaxation education    Continue Psychosocial Services  Follow up required by staff    Comments Osa has a lot of family stressors her son just came to live with her who has problems with substance addiction. Janelis just lost her birth and biological parents in the past year      Initial Review   Source of Stress Concerns Chronic Illness;Family;Occupation           Vocational Rehabilitation: Provide vocational rehab assistance to qualifying candidates.   Vocational Rehab Evaluation & Intervention:  Vocational Rehab - 08/24/20 1215      Initial Vocational Rehab Evaluation & Intervention   Assessment shows need for Vocational Rehabilitation No   Cythnia works two jobs and does not need vocational rehab at this time          Education: Education Goals: Education classes will be provided on a weekly basis, covering required topics. Participant will state understanding/return demonstration of topics presented.  Learning Barriers/Preferences:  Learning Barriers/Preferences - 08/24/20 1147      Learning Barriers/Preferences   Learning Barriers Sight   reading glasses   Learning Preferences Group Instruction;Individual Instruction;Skilled Demonstration           Education Topics: Hypertension, Hypertension Reduction -  Define heart disease and high blood pressure. Discus how high blood pressure affects the body and ways to reduce high blood pressure.   Exercise and Your Heart -Discuss why it is important to exercise, the FITT principles of exercise, normal and  abnormal responses to exercise, and how to exercise safely.   Angina -Discuss definition of angina, causes of angina, treatment of angina, and how to decrease risk of having angina.   Cardiac Medications -Review what the following cardiac medications are used for, how they affect the body, and side effects that may occur when taking the medications.  Medications include Aspirin, Beta blockers, calcium channel blockers, ACE Inhibitors, angiotensin receptor blockers, diuretics, digoxin, and antihyperlipidemics.   Congestive Heart Failure -Discuss the definition of CHF, how to live with CHF, the signs and symptoms of CHF, and how keep track of weight and sodium intake.   Heart Disease and Intimacy -Discus the effect sexual activity has on the heart, how changes occur during intimacy as we age, and safety during sexual activity.   Smoking Cessation / COPD -Discuss different methods to quit smoking, the health benefits of quitting smoking, and the definition of COPD.   Nutrition I: Fats -Discuss the types of cholesterol, what cholesterol does to the heart, and how cholesterol levels can be controlled.   Nutrition II: Labels -Discuss the different components of food labels and how to read food label   Heart Parts/Heart Disease and PAD -Discuss the anatomy of the heart, the pathway of blood circulation through the heart, and these are affected by heart disease.   Stress I: Signs and Symptoms -Discuss the causes of stress, how stress may lead to anxiety and depression, and ways to limit stress.   Stress II: Relaxation -Discuss different types of relaxation techniques to limit stress.   Warning Signs of Stroke / TIA -Discuss definition of a stroke, what the signs and symptoms are of a stroke, and how to identify when someone is having stroke.   Knowledge Questionnaire Score:  Knowledge Questionnaire Score - 08/24/20 1152      Knowledge Questionnaire Score   Pre Score 19/24            Core Components/Risk Factors/Patient Goals at Admission:  Personal Goals and Risk Factors at Admission - 08/24/20 1153      Core Components/Risk Factors/Patient Goals on Admission    Weight Management Yes;Obesity;Weight Loss    Admit Weight 177 lb 0.5 oz (80.3 kg)    Expected Outcomes Short Term: Continue to assess and modify interventions until short term weight is achieved;Long Term: Adherence to nutrition and physical activity/exercise program aimed toward attainment of established weight goal;Weight Maintenance: Understanding of the daily nutrition guidelines, which includes 25-35% calories from fat, 7% or less cal from saturated fats, less than 200mg  cholesterol, less than 1.5gm of sodium, & 5 or more servings of fruits and vegetables daily;Weight Loss: Understanding of general recommendations for a balanced deficit meal plan, which promotes 1-2 lb weight loss per week and includes a negative energy balance of [phone] kcal/d;Understanding recommendations for meals to include 15-35% energy as protein, 25-35% energy from fat, 35-60% energy from carbohydrates, less than 200mg  of dietary cholesterol, 20-35 gm of total fiber daily;Understanding of distribution of calorie intake throughout the day with the consumption of 4-5 meals/snacks    Diabetes Yes    Intervention Provide education about signs/symptoms and action to take for hypo/hyperglycemia.;Provide education about proper nutrition, including hydration, and aerobic/resistive exercise prescription along with prescribed medications to achieve  blood glucose in normal ranges: Fasting glucose 65-99 mg/dL    Expected Outcomes Short Term: Participant verbalizes understanding of the signs/symptoms and immediate care of hyper/hypoglycemia, proper foot care and importance of medication, aerobic/resistive exercise and nutrition plan for blood glucose control.;Long Term: Attainment of HbA1C < 7%.    Hypertension Yes    Intervention Provide  education on lifestyle modifcations including regular physical activity/exercise, weight management, moderate sodium restriction and increased consumption of fresh fruit, vegetables, and low fat dairy, alcohol moderation, and smoking cessation.;Monitor prescription use compliance.    Expected Outcomes Short Term: Continued assessment and intervention until BP is < 140/1mm HG in hypertensive participants. < 130/23mm HG in hypertensive participants with diabetes, heart failure or chronic kidney disease.;Long Term: Maintenance of blood pressure at goal levels.    Lipids Yes    Intervention Provide education and support for participant on nutrition & aerobic/resistive exercise along with prescribed medications to achieve LDL 70mg , HDL >40mg .    Expected Outcomes Short Term: Participant states understanding of desired cholesterol values and is compliant with medications prescribed. Participant is following exercise prescription and nutrition guidelines.;Long Term: Cholesterol controlled with medications as prescribed, with individualized exercise RX and with personalized nutrition plan. Value goals: LDL < 70mg , HDL > 40 mg.    Stress Yes    Intervention Offer individual and/or small group education and counseling on adjustment to heart disease, stress management and health-related lifestyle change. Teach and support self-help strategies.;Refer participants experiencing significant psychosocial distress to appropriate mental health specialists for further evaluation and treatment. When possible, include family members and significant others in education/counseling sessions.    Expected Outcomes Short Term: Participant demonstrates changes in health-related behavior, relaxation and other stress management skills, ability to obtain effective social support, and compliance with psychotropic medications if prescribed.;Long Term: Emotional wellbeing is indicated by absence of clinically significant psychosocial  distress or social isolation.           Core Components/Risk Factors/Patient Goals Review:   Goals and Risk Factor Review    Row Name 08/31/20 1002 08/31/20 1003 09/07/20 0823         Core Components/Risk Factors/Patient Goals Review   Personal Goals Review Weight Management/Obesity Stress;Hypertension;Diabetes;Lipids Stress;Hypertension;Diabetes;Lipids     Review -- Suhana started exercise on 08/31/20 and did well with exercise. Tanekia is doing well with exercise.Zonda just returned to work this week     Expected Outcomes -- Ravinder will continue to participate in phase 2 cardiac rehab for exercise nutrition and lifestyle modifications. Kanako will continue to participate in phase 2 cardiac rehab for exercise nutrition and lifestyle modifications.            Core Components/Risk Factors/Patient Goals at Discharge (Final Review):   Goals and Risk Factor Review - 09/07/20 0823      Core Components/Risk Factors/Patient Goals Review   Personal Goals Review Stress;Hypertension;Diabetes;Lipids    Review Tabaitha is doing well with exercise.Noelle just returned to work this week    Expected Outcomes Chelci will continue to participate in phase 2 cardiac rehab for exercise nutrition and lifestyle modifications.           ITP Comments:  ITP Comments    Row Name 08/24/20 0940 09/07/20 0818         ITP Comments Dr Armanda Magic MD, Medical Director 30 Day ITP Review. Konnie has good attendance and participation in phase 2 cardiac rehab.             Comments: See ITP comments. Lupita Leash  is participating in cardiac rehab until the end of the month due to insurance.Gladstone Lighter, RN,BSN 09/07/2020 11:42 AM

## 2020-09-08 ENCOUNTER — Ambulatory Visit (INDEPENDENT_AMBULATORY_CARE_PROVIDER_SITE_OTHER): Payer: BC Managed Care – PPO | Admitting: Sports Medicine

## 2020-09-08 ENCOUNTER — Other Ambulatory Visit: Payer: Self-pay

## 2020-09-08 ENCOUNTER — Encounter (HOSPITAL_COMMUNITY)
Admission: RE | Admit: 2020-09-08 | Discharge: 2020-09-08 | Disposition: A | Discharge status: Home / Self Care | Payer: BC Managed Care – PPO | Source: Ambulatory Visit | Attending: Internal Medicine | Admitting: Internal Medicine

## 2020-09-08 DIAGNOSIS — Z955 Presence of coronary angioplasty implant and graft: Secondary | ICD-10-CM

## 2020-09-08 DIAGNOSIS — M25511 Pain in right shoulder: Secondary | ICD-10-CM

## 2020-09-08 DIAGNOSIS — M25512 Pain in left shoulder: Secondary | ICD-10-CM | POA: Diagnosis not present

## 2020-09-08 DIAGNOSIS — I214 Non-ST elevation (NSTEMI) myocardial infarction: Secondary | ICD-10-CM

## 2020-09-08 DIAGNOSIS — G8929 Other chronic pain: Secondary | ICD-10-CM

## 2020-09-08 LAB — GLUCOSE, CAPILLARY: Glucose-Capillary: 201 mg/dL — ABNORMAL HIGH (ref 70–99)

## 2020-09-08 NOTE — Assessment & Plan Note (Addendum)
Sarah Kane is a pleasant 49 year old female, she has a long history of chronic bilateral shoulder pain, she has had some injections, and an MRI of the shoulder on the left in the past. She has bilateral Neer's, Hawkins, empty can signs as well as limitations in range of motion. She is here with a goal of getting bilateral shoulder MRIs, I did advise her that it was unlikely these would be covered without 6 weeks of conservative treatment, as well as x-rays prior. She would like me to order them and she understands there will likely not get approved. If not improved we will continue with conservative treatment, she is for the most part hoping that she can get them covered while her deductible is met.

## 2020-09-08 NOTE — Assessment & Plan Note (Signed)
>>  ASSESSMENT AND PLAN FOR BILATERAL SHOULDER PAIN WRITTEN ON 09/08/2020  2:07 PM BY Benjamin Stain, Ihor Austin, MD  Sarah Kane is a pleasant 49 year old female, she has a long history of chronic bilateral shoulder pain, she has had some injections, and an MRI of the shoulder on the left in the past. She has bilateral Neer's, Hawkins, empty can signs as well as limitations in range of motion. She is here with a goal of getting bilateral shoulder MRIs, I did advise her that it was unlikely these would be covered without 6 weeks of conservative treatment, as well as x-rays prior. She would like me to order them and she understands there will likely not get approved. If not improved we will continue with conservative treatment, she is for the most part hoping that she can get them covered while her deductible is met.

## 2020-09-08 NOTE — Progress Notes (Signed)
Nutrition Note Follow Up  Pt reports taking last glimeperide today. She has already called the pharmacy. Encouraged pt to get to pharmacy today to pick up medications. She has been checking blood sugars 1-2 times per day. Provided her with more diabetes supplies for [employer] meter. Recommended she call her PCP and ask for a prescription for a glucometer that is covered by her insurance.  She reports CBGs 94-130 mg/dl fasting and during night shift. Congratulated pt on CBGs in target range.   Will continue to monitor during cardiac rehab.   Andrey Campanile, MS, RDN, LDN

## 2020-09-08 NOTE — Progress Notes (Signed)
    Procedures performed today:    None.  Independent interpretation of notes and tests performed by another provider:   None.  Brief History, Exam, Impression, and Recommendations:    Bilateral shoulder pain Rayleen is a pleasant 49 year old female, she has a long history of chronic bilateral shoulder pain, she has had some injections, and an MRI of the shoulder on the left in the past. She has bilateral Neer's, Hawkins, empty can signs as well as limitations in range of motion. She is here with a goal of getting bilateral shoulder MRIs, I did advise her that it was unlikely these would be covered without 6 weeks of conservative treatment, as well as x-rays prior. She would like me to order them and she understands there will likely not get approved. If not improved we will continue with conservative treatment, she is for the most part hoping that she can get them covered while her deductible is met.    ___________________________________________ Gwen Her. Dianah Field, M.D., ABFM., CAQSM. Primary Care and [place] Instructor of [place] [facility]

## 2020-09-09 ENCOUNTER — Other Ambulatory Visit: Payer: Self-pay

## 2020-09-09 DIAGNOSIS — E1165 Type 2 diabetes mellitus with hyperglycemia: Secondary | ICD-10-CM

## 2020-09-09 MED ORDER — GLIMEPIRIDE 4 MG PO TABS: ORAL_TABLET | ORAL | 1 refills | Status: DC

## 2020-09-10 ENCOUNTER — Other Ambulatory Visit: Payer: Self-pay

## 2020-09-10 ENCOUNTER — Encounter (HOSPITAL_COMMUNITY)
Admission: RE | Admit: 2020-09-10 | Discharge: 2020-09-10 | Disposition: A | Discharge status: Home / Self Care | Payer: BC Managed Care – PPO | Source: Ambulatory Visit | Attending: Internal Medicine | Admitting: Internal Medicine

## 2020-09-10 ENCOUNTER — Telehealth: Payer: Self-pay | Admitting: Sports Medicine

## 2020-09-10 DIAGNOSIS — I214 Non-ST elevation (NSTEMI) myocardial infarction: Secondary | ICD-10-CM

## 2020-09-10 DIAGNOSIS — Z955 Presence of coronary angioplasty implant and graft: Secondary | ICD-10-CM

## 2020-09-10 LAB — GLUCOSE, CAPILLARY: Glucose-Capillary: 190 mg/dL — ABNORMAL HIGH (ref 70–99)

## 2020-09-10 NOTE — Progress Notes (Signed)
Nutrition Note  Reviewed [employer] app with pt for self monitoring.  Pt was signed up for app.  She was able to add food and delete food. She was able to use barcode to add food to diary. She showed good understanding of how to navigate the app.  Will continue to monitor during cardiac rehab.  Andrey Campanile, MS, RDN, LDN

## 2020-09-10 NOTE — Telephone Encounter (Signed)
Surprisingly left and right shoulder MRIs are both approved, approval #[id number] expires 11/07/2020.

## 2020-09-13 ENCOUNTER — Telehealth: Payer: Self-pay | Admitting: Student

## 2020-09-13 ENCOUNTER — Encounter (HOSPITAL_COMMUNITY)
Admission: RE | Admit: 2020-09-13 | Discharge: 2020-09-13 | Disposition: A | Discharge status: Home / Self Care | Payer: BC Managed Care – PPO | Source: Ambulatory Visit | Attending: Internal Medicine | Admitting: Internal Medicine

## 2020-09-13 ENCOUNTER — Other Ambulatory Visit: Payer: Self-pay

## 2020-09-13 ENCOUNTER — Ambulatory Visit (HOSPITAL_COMMUNITY)
Admission: RE | Admit: 2020-09-13 | Discharge: 2020-09-13 | Disposition: A | Discharge status: Home / Self Care | Payer: BC Managed Care – PPO | Source: Ambulatory Visit | Attending: Internal Medicine | Admitting: Internal Medicine

## 2020-09-13 DIAGNOSIS — I214 Non-ST elevation (NSTEMI) myocardial infarction: Secondary | ICD-10-CM | POA: Diagnosis not present

## 2020-09-13 DIAGNOSIS — Z955 Presence of coronary angioplasty implant and graft: Secondary | ICD-10-CM | POA: Diagnosis not present

## 2020-09-13 NOTE — Telephone Encounter (Signed)
Imaging number was given to patient.

## 2020-09-13 NOTE — Telephone Encounter (Signed)
   Patient presented for cardiac rehab today. She noted intermittent chest pain last night while working at [employer] so Cardiac Rehab RN paged inpatient team. She ranked the pain as 2/10 last night. She states she has to move a lot of boxes at [facility] and pain is reproducible with palpation of chest wall so suspect musculoskeletal component. She is currently chest pain free. EKG shows no acute ischemic changes. 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 Given patient is chest pain free and EKG unremarkable, OK to proceed with Cardiac Rehab today. Recommended Tylenol for suspected musculoskeletal pain. Patient can go ahead and increase Imdur to 60mg  daily (currently on 45mg  daily) to see if that helps. If she continues to have intermittent pain, patient advised to call our office. Cardiac Rehab RN will notify patient of this plan.  , PA-C 09/13/2020 10:00 AM

## 2020-09-13 NOTE — Progress Notes (Signed)
Sarah Kane presented to Cardiac rehab for her scheduled appt reporting initially that she was had chest discomfort off an on during the night while she was at work at [employer]. Presently not having any discomfort.  It is difficult for Sarah Kane to articulate what she is feeling and whether it is worthy to mention.  Upon further probing and  Repeat clarification of what I am hearing her verbalize.  Sarah Kane has noticed that now that she has return to [employer] she has what she terms "uncomfortable" but not pain. Pt endorses pain as being her rotator cuff, frozen shoulder, broken wrist and broken toes.  This sensation is different and does not resemble symptoms she was having prior to the second intervention.  Pt reports she was unaware she was having a heart attack prior to her first event.  Pt remarks over and over she does not know how to distinguish between cardiac discomfort and whether she will continue to have some level of discomfort because of the 99% lesion that is being treated medically. Sarah Kane uncomfortable feeling occurs on exertion and not ar rest.  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 Bp 120/80 with SR.  12 lead ekg obtained and Sarah Kane/[facility] paged. Advised to page Sarah Skiff PA with Dr. Tenny Craw.  Relayed pt discomfort and review of 12 lead ekg - SR  Mentioned that in [place] follow up from first intervention that her imdur dose could increase from 45 to 60 mg daily. As pt continues to describe which is difficult for her,  the uncomfortable sensation I am able to reproduce this by pressing on her upper left chest which is where the uncomfortable feeling is located. Pt lifts boxes with her right hand but carries the box on her left shoulder.  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  Advised to take Tylenol and upon review of her cath report - may increase  Imdur to 60 mg daily.  Pt verbalized understanding and has a better understanding. Based upon our conversation and her level of anxiety and feeling overwhelmed, I encourage Sarah Kane to contact her primary MD for increasing her dosage of lexapro which was started a few weeks ago and ask for counseling support.  Sarah Kane states that since has started Lexapro she has noticed that she doesn't have night terrors but continues to have vivid dreams.  She feels the lexapro is helping as others in the past have not.  Sarah Kane had a difficult childhood and lost family within a short period of time - "It has been hard to process it all".  Further reiterated that counseling support would be a benefit for her.  Sarah Kane talks about reading the bible and that helps as well as talking to her friends.  Further pressed that I certainly do understand and believe as she does but there are people who have the gift of counseling. Sarah Kane to consider. Okay for pt return on Wednesday for Cardiac rehab, will check in with her to see how she is feeling and followed through on my instructions. Alanson Aly, BSN Cardiac and Emergency planning/management officer

## 2020-09-15 ENCOUNTER — Encounter (HOSPITAL_COMMUNITY)
Admission: RE | Admit: 2020-09-15 | Discharge: 2020-09-15 | Disposition: A | Discharge status: Home / Self Care | Payer: BC Managed Care – PPO | Source: Ambulatory Visit | Attending: Internal Medicine | Admitting: Internal Medicine

## 2020-09-15 ENCOUNTER — Other Ambulatory Visit: Payer: Self-pay

## 2020-09-15 DIAGNOSIS — Z955 Presence of coronary angioplasty implant and graft: Secondary | ICD-10-CM

## 2020-09-15 DIAGNOSIS — I214 Non-ST elevation (NSTEMI) myocardial infarction: Secondary | ICD-10-CM | POA: Diagnosis not present

## 2020-09-19 ENCOUNTER — Ambulatory Visit (INDEPENDENT_AMBULATORY_CARE_PROVIDER_SITE_OTHER): Payer: BC Managed Care – PPO

## 2020-09-19 ENCOUNTER — Other Ambulatory Visit: Payer: Self-pay

## 2020-09-19 DIAGNOSIS — M25511 Pain in right shoulder: Secondary | ICD-10-CM | POA: Diagnosis not present

## 2020-09-19 DIAGNOSIS — G8929 Other chronic pain: Secondary | ICD-10-CM

## 2020-09-19 DIAGNOSIS — S46912A Strain of unspecified muscle, fascia and tendon at shoulder and upper arm level, left arm, initial encounter: Secondary | ICD-10-CM | POA: Diagnosis not present

## 2020-09-20 ENCOUNTER — Encounter (HOSPITAL_COMMUNITY)
Admission: RE | Admit: 2020-09-20 | Discharge: 2020-09-20 | Disposition: A | Discharge status: Home / Self Care | Payer: BC Managed Care – PPO | Source: Ambulatory Visit | Attending: Internal Medicine | Admitting: Internal Medicine

## 2020-09-20 DIAGNOSIS — I214 Non-ST elevation (NSTEMI) myocardial infarction: Secondary | ICD-10-CM | POA: Diagnosis not present

## 2020-09-20 DIAGNOSIS — Z955 Presence of coronary angioplasty implant and graft: Secondary | ICD-10-CM

## 2020-09-22 ENCOUNTER — Encounter (HOSPITAL_COMMUNITY)
Admission: RE | Admit: 2020-09-22 | Discharge: 2020-09-22 | Disposition: A | Discharge status: Home / Self Care | Payer: BC Managed Care – PPO | Source: Ambulatory Visit | Attending: Internal Medicine | Admitting: Internal Medicine

## 2020-09-22 ENCOUNTER — Other Ambulatory Visit: Payer: Self-pay

## 2020-09-22 DIAGNOSIS — I214 Non-ST elevation (NSTEMI) myocardial infarction: Secondary | ICD-10-CM | POA: Diagnosis not present

## 2020-09-22 DIAGNOSIS — Z955 Presence of coronary angioplasty implant and graft: Secondary | ICD-10-CM

## 2020-09-22 NOTE — Progress Notes (Signed)
Nutrition Note  Reviewed heart healthy diet (label reading, lipid management). Pt asked appropriate questions and understood information reviewed. Pt is actively making changes to her diet as she learns new information. She continues to experience fear associated with her diet and heart condition. We reviewed ways to incorporate healthy fats (tuna, olive oil, peanuts, cashews) and reduced refined carbohydrates. Reviewed snack ideas for work and ways to cook at home more often.  Pt continues to check CBGs with free/limited supplies from [employer]. She consistently runs in the 200's mg/dl.  Pt needs prescription for glucometer and supplies. Recommended she contact her PCP. Pt had no further questions. Provided contact informations for future questions she may have.  Andrey Campanile, MS, RDN, LDN

## 2020-09-27 ENCOUNTER — Encounter (HOSPITAL_COMMUNITY): Payer: BC Managed Care – PPO

## 2020-09-29 ENCOUNTER — Encounter (HOSPITAL_COMMUNITY): Payer: BC Managed Care – PPO

## 2020-10-01 ENCOUNTER — Encounter (HOSPITAL_COMMUNITY): Payer: BC Managed Care – PPO

## 2020-10-04 ENCOUNTER — Encounter (HOSPITAL_COMMUNITY): Payer: BC Managed Care – PPO

## 2020-10-05 ENCOUNTER — Ambulatory Visit: Payer: BC Managed Care – PPO | Admitting: Sports Medicine

## 2020-10-06 ENCOUNTER — Encounter (HOSPITAL_COMMUNITY): Payer: BC Managed Care – PPO

## 2020-10-08 ENCOUNTER — Encounter (HOSPITAL_COMMUNITY): Payer: BC Managed Care – PPO

## 2020-10-08 ENCOUNTER — Other Ambulatory Visit: Payer: Self-pay | Admitting: Osteopathic Medicine

## 2020-10-11 ENCOUNTER — Ambulatory Visit: Payer: BC Managed Care – PPO | Admitting: Internal Medicine

## 2020-10-11 ENCOUNTER — Encounter (HOSPITAL_COMMUNITY): Payer: BC Managed Care – PPO

## 2020-10-12 NOTE — Progress Notes (Signed)
Discharge Progress Report  Patient Details  Name: Sarah Kane MRN: [id number] Date of Birth: 1970/12/22 Referring Provider:   Flowsheet Row CARDIAC REHAB PHASE II ORIENTATION from 08/24/2020 in [facility] [place] [facility]  Referring Provider Fay Records, MD       Number of Visits: 9  Reason for Discharge:  Early Exit:  Insurance  Smoking History:  Social History   Tobacco Use  Smoking Status Never Smoker  Smokeless Tobacco Never Used    Diagnosis:  NSTEMI (non-ST elevated myocardial infarction) ([place]) 05/20/20  05/20/20 S/P DESx 2 RCA , 07/22/20 S/P DES Cx  ADL [facility]SD:   Initial Exercise Prescription:  Initial Exercise Prescription - 08/24/20 1100      Date of Initial Exercise RX and Referring Provider   Date 08/24/20    Referring Provider Fay Records, MD    Expected Discharge Date 09/22/20      NuStep   Level 2    SPM 85    Minutes 15    METs 2      Track   Laps 10    Minutes 15    METs 2.16      Prescription Details   Frequency (times per week) 3    Duration Progress to 30 minutes of continuous aerobic without signs/symptoms of physical distress      Intensity   THRR 40-80% of Max Heartrate 68-136    Ratings of Perceived Exertion 11-13    Perceived Dyspnea 0-4      Progression   Progression Continue progressive overload as per policy without signs/symptoms or physical distress.      Resistance Training   Training Prescription Yes    Weight 2 lbs    Reps 10-15           Discharge Exercise Prescription (Final Exercise Prescription Changes):  Exercise Prescription Changes - 09/22/20 1023      Response to Exercise   Blood Pressure (Admit) 122/76    Blood Pressure (Exercise) 108/74    Blood Pressure (Exit) 110/80    Heart Rate (Admit) 82 bpm    Heart Rate (Exercise) 99 bpm    Heart Rate (Exit) 79 bpm    Rating of Perceived Exertion (Exercise) 11    Symptoms None    Comments Reviewed home exercise Rx, Pt completed CRP2  program today.    Duration Continue with 30 min of aerobic exercise without signs/symptoms of physical distress.    Intensity THRR unchanged      Progression   Progression Continue to progress workloads to maintain intensity without signs/symptoms of physical distress.    Average METs 1.8      Resistance Training   Training Prescription No    Weight No weights done on Wednesday      Interval Training   Interval Training No      NuStep   Level 3    SPM 75    Minutes 30    METs 1.8      Home Exercise Plan   Plans to continue exercise at Home (comment)    Frequency Add 3 additional days to program exercise sessions.    Initial Home Exercises Provided 09/22/20           Functional Capacity:  6 Minute Walk    Row Name 08/24/20 1000         6 Minute Walk   Phase Initial     Distance 1261 feet     Walk Time 6  minutes     # of Rest Breaks 0     MPH 2.4     METS 3.4     RPE 11     Perceived Dyspnea  0     VO2 Peak 11.98     Symptoms No     Resting HR 84 bpm     Resting BP 108/78     Resting Oxygen Saturation  99 %     Exercise Oxygen Saturation  during 6 min walk 98 %     Max Ex. HR 97 bpm     Max Ex. BP 124/80     2 Minute Post BP 114/68            Psychological, QOL, Others - Outcomes: PHQ 2/9: Depression screen [facility] [place] 2/9 08/24/2020 11/06/2019 07/23/2019 04/22/2019 10/29/2018  Decreased Interest 0 3 3 3 3   Down, Depressed, Hopeless 1 3 3 3 2   PHQ - 2 Score 1 6 6 6 5   Altered sleeping - 3 3 3 3   Tired, decreased energy - 3 3 3 3   Change in appetite - 3 3 3 3   Feeling bad or failure about yourself  - 3 3 2 2   Trouble concentrating - 0 0 0 0  Moving slowly or fidgety/restless - 0 3 0 0  Suicidal thoughts - 1 0 0 0  PHQ-9 Score - 19 21 17 16   Difficult doing work/chores - Very difficult - Very difficult Somewhat difficult    Quality of Life:  Quality of Life - 08/24/20 1146      Quality of Life   Select Quality of Life      Quality of Life Scores    Health/Function Pre 19.96 %    Socioeconomic Pre 19.43 %    Psych/Spiritual Pre 15.36 %    Family Pre 9 %    GLOBAL Pre 17.39 %           Personal Goals: Goals established at orientation with interventions provided to work toward goal.  Personal Goals and Risk Factors at Admission - 08/24/20 1153      Core Components/Risk Factors/Patient Goals on Admission    Weight Management Yes;Obesity;Weight Loss    Admit Weight 177 lb 0.5 oz (80.3 kg)    Expected Outcomes Short Term: Continue to assess and modify interventions until short term weight is achieved;Long Term: Adherence to nutrition and physical activity/exercise program aimed toward attainment of established weight goal;Weight Maintenance: Understanding of the daily nutrition guidelines, which includes 25-35% calories from fat, 7% or less cal from saturated fats, less than 268m cholesterol, less than 1.5gm of sodium, & 5 or more servings of fruits and vegetables daily;Weight Loss: Understanding of general recommendations for a balanced deficit meal plan, which promotes 1-2 lb weight loss per week and includes a negative energy balance of [phone] kcal/d;Understanding recommendations for meals to include 15-35% energy as protein, 25-35% energy from fat, 35-60% energy from carbohydrates, less than 2018mof dietary cholesterol, 20-35 gm of total fiber daily;Understanding of distribution of calorie intake throughout the day with the consumption of 4-5 meals/snacks    Diabetes Yes    Intervention Provide education about signs/symptoms and action to take for hypo/hyperglycemia.;Provide education about proper nutrition, including hydration, and aerobic/resistive exercise prescription along with prescribed medications to achieve blood glucose in normal ranges: Fasting glucose 65-99 mg/dL    Expected Outcomes Short Term: Participant verbalizes understanding of the signs/symptoms and immediate care of hyper/hypoglycemia, proper foot care and importance  of  medication, aerobic/resistive exercise and nutrition plan for blood glucose control.;Long Term: Attainment of HbA1C < 7%.    Hypertension Yes    Intervention Provide education on lifestyle modifcations including regular physical activity/exercise, weight management, moderate sodium restriction and increased consumption of fresh fruit, vegetables, and low fat dairy, alcohol moderation, and smoking cessation.;Monitor prescription use compliance.    Expected Outcomes Short Term: Continued assessment and intervention until BP is < 140/15m HG in hypertensive participants. < 130/838mHG in hypertensive participants with diabetes, heart failure or chronic kidney disease.;Long Term: Maintenance of blood pressure at goal levels.    Lipids Yes    Intervention Provide education and support for participant on nutrition & aerobic/resistive exercise along with prescribed medications to achieve LDL <7086mHDL >71m28m  Expected Outcomes Short Term: Participant states understanding of desired cholesterol values and is compliant with medications prescribed. Participant is following exercise prescription and nutrition guidelines.;Long Term: Cholesterol controlled with medications as prescribed, with individualized exercise RX and with personalized nutrition plan. Value goals: LDL < 70mg72mL > 40 mg.    Stress Yes    Intervention Offer individual and/or small group education and counseling on adjustment to heart disease, stress management and health-related lifestyle change. Teach and support self-help strategies.;Refer participants experiencing significant psychosocial distress to appropriate mental health specialists for further evaluation and treatment. When possible, include family members and significant others in education/counseling sessions.    Expected Outcomes Short Term: Participant demonstrates changes in health-related behavior, relaxation and other stress management skills, ability to obtain effective  social support, and compliance with psychotropic medications if prescribed.;Long Term: Emotional wellbeing is indicated by absence of clinically significant psychosocial distress or social isolation.            Personal Goals Discharge:  Goals and Risk Factor Review    Row Name 08/31/20 1002 08/31/20 1003 09/07/20 0823 09/22/20 1009       Core Components/Risk Factors/Patient Goals Review   Personal Goals Review Weight Management/Obesity Stress;Hypertension;Diabetes;Lipids Stress;Hypertension;Diabetes;Lipids Stress;Hypertension;Diabetes;Lipids    Review -- DonnaAniciated exercise on 08/31/20 and did well with exercise. DonnaJacaylaoing well with exercise.DonnaBrittiney returned to work this week DonnaPeter [employer] and continues to work on managing her diabetes - coaching from RD.  [place].  Lipids mangaged through medication, diet and exercise.  Stress remains an issue of concern for her but she feels she is on track and in a better position to seek and receive help.    Expected Outcomes -- DonnaDulcy continue to participate in phase 2 cardiac rehab for exercise nutrition and lifestyle modifications. DonnaZareth continue to participate in phase 2 cardiac rehab for exercise nutrition and lifestyle modifications. DonnaBetsiinues to be an active participant in her healthy care through lifestyle modifications.           Exercise Goals and Review:  Exercise Goals    Row Name 08/24/20 1201             Exercise Goals   Increase Physical Activity Yes       Intervention Provide advice, education, support and counseling about physical activity/exercise needs.;Develop an individualized exercise prescription for aerobic and resistive training based on initial evaluation findings, risk stratification, comorbidities and participant's personal goals.       Expected Outcomes Short Term: Attend rehab on a regular basis to increase amount of physical activity.;Long Term: Add in home exercise to  make exercise part of routine and to increase amount of physical activity.;Long Term: Exercising  regularly at least 3-5 days a week.       Increase Strength and Stamina Yes       Intervention Provide advice, education, support and counseling about physical activity/exercise needs.;Develop an individualized exercise prescription for aerobic and resistive training based on initial evaluation findings, risk stratification, comorbidities and participant's personal goals.       Expected Outcomes Short Term: Increase workloads from initial exercise prescription for resistance, speed, and METs.;Short Term: Perform resistance training exercises routinely during rehab and add in resistance training at home;Long Term: Improve cardiorespiratory fitness, muscular endurance and strength as measured by increased METs and functional capacity (6MWT)       Able to understand and use rate of perceived exertion (RPE) scale Yes       Intervention Provide education and explanation on how to use RPE scale       Expected Outcomes Short Term: Able to use RPE daily in rehab to express subjective intensity level;Long Term:  Able to use RPE to guide intensity level when exercising independently       Knowledge and understanding of Target Heart Rate Range (THRR) Yes       Intervention Provide education and explanation of THRR including how the numbers were predicted and where they are located for reference       Expected Outcomes Short Term: Able to state/look up THRR;Long Term: Able to use THRR to govern intensity when exercising independently;Short Term: Able to use daily as guideline for intensity in rehab       Understanding of Exercise Prescription Yes       Intervention Provide education, explanation, and written materials on patient's individual exercise prescription       Expected Outcomes Short Term: Able to explain program exercise prescription;Long Term: Able to explain home exercise prescription to exercise  independently              Exercise Goals Re-Evaluation:  Exercise Goals Re-Evaluation    Row Name 08/30/20 1243 09/23/20 1030           Exercise Goal Re-Evaluation   Exercise Goals Review Increase Physical Activity;Increase Strength and Stamina;Able to understand and use rate of perceived exertion (RPE) scale;Knowledge and understanding of Target Heart Rate Range (THRR);Understanding of Exercise Prescription Increase Physical Activity;Increase Strength and Stamina;Able to understand and use rate of perceived exertion (RPE) scale;Knowledge and understanding of Target Heart Rate Range (THRR);Able to check pulse independently;Understanding of Exercise Prescription      Comments Pt's first day of exercise in the CRP2 program. Pt did c/o fatigue when walking and did take rest break. No other complaints voiced. Pt understands RPE scale, THRR, and Exercise Rx. Pt completed the CRP2 program due to insurance reasons. Reviewed home exercise Rx. Pt had an average MET level of 1.8. Pt will exercise at home 5x/week by walking.      Expected Outcomes Will continue to monitor patient and progress exercise workloads as tolerated. Pt will continue to exercise by walking at home.             Nutrition & Weight - Outcomes:  Pre Biometrics - 08/24/20 0900      Pre Biometrics   Waist Circumference 45 inches    Hip Circumference 46.5 inches    Waist to Hip Ratio 0.97 %    Triceps Skinfold 41 mm    % Body Fat 47.3 %    Grip Strength 30 kg    Flexibility 0 in    Single Leg Stand 10.56  seconds            Nutrition:  Nutrition Therapy & Goals - 09/03/20 1541      Nutrition Therapy   Diet TLC, Carb mod    Drug/Food Interactions Statins/Certain Fruits      Personal Nutrition Goals   Nutrition Goal Improved blood glucose control as evidenced by pt's A1c trending from 8.0 toward less than 7.0.    Personal Goal #2 CBG concentrations in the normal range or as close to normal as is safely possible.     Personal Goal #3 Pt to incorporate plate method into meal planning for balanced meals and increased nutrient density.      Intervention Plan   Intervention Prescribe, educate and counsel regarding individualized specific dietary modifications aiming towards targeted core components such as weight, hypertension, lipid management, diabetes, heart failure and other comorbidities.    Expected Outcomes Short Term Goal: A plan has been developed with personal nutrition goals set during dietitian appointment.;Long Term Goal: Adherence to prescribed nutrition plan.           Nutrition Discharge:   Education Questionnaire Score:  Knowledge Questionnaire Score - 08/24/20 1152      Knowledge Questionnaire Score   Pre Score 19/24           Goals reviewed with patient; copy given to patient.Pt graduated from cardiac rehab program on 09/22/30 with completion of 9 exercise sessions in Phase II. Pt maintained good attendance and progressed nicely during her participation in rehab as evidenced by increased MET level.   Medication list reconciled.  Pt feels she has achieved her goals during cardiac rehab.   Pt plans to continue exercise by walking at home. Tasnim completed the program early due to insurance/ financial reasons.Barnet Pall, RN,BSN 10/12/2020 9:30 AM

## 2020-10-13 ENCOUNTER — Other Ambulatory Visit: Payer: Self-pay | Admitting: Nurse Practitioner

## 2020-10-13 ENCOUNTER — Encounter (HOSPITAL_COMMUNITY): Payer: BC Managed Care – PPO

## 2020-10-14 ENCOUNTER — Other Ambulatory Visit: Payer: Self-pay

## 2020-10-14 DIAGNOSIS — F418 Other specified anxiety disorders: Secondary | ICD-10-CM

## 2020-10-14 MED ORDER — ESCITALOPRAM OXALATE 10 MG PO TABS: 10.0000 mg | ORAL_TABLET | Freq: Every day | ORAL | 0 refills | Status: DC

## 2020-10-15 ENCOUNTER — Encounter (HOSPITAL_COMMUNITY): Payer: BC Managed Care – PPO

## 2020-10-18 ENCOUNTER — Encounter (HOSPITAL_COMMUNITY): Payer: BC Managed Care – PPO

## 2020-10-20 ENCOUNTER — Encounter (HOSPITAL_COMMUNITY): Payer: BC Managed Care – PPO

## 2020-10-22 ENCOUNTER — Encounter (HOSPITAL_COMMUNITY): Payer: BC Managed Care – PPO

## 2020-10-26 NOTE — Progress Notes (Signed)
[facility] - Clinic Note  10/27/2020     CHIEF COMPLAINT Patient presents for Diabetic Eye Exam   HISTORY OF PRESENT ILLNESS: Sarah Kane is a 50 y.o. female who presents to the clinic today for:   HPI    Diabetic Eye Exam    Vision is blurred for near and is blurred for distance.  Diabetes characteristics include Type 2.  This started 20 years ago.  Blood sugar level is controlled.  Associated Diagnosis Neuropathy.  I, the attending physician,  performed the HPI with the patient and updated documentation appropriately.          Comments    Retina eval per Dr. Karleen Hampshire for Diabetic Retinopathy with DME-  Pt thought she needed glasses.  Went to see Dr. Karleen Hampshire and he found the retina problem.  Once she is stable she will be able to get glasses per patient.        Last edited by Rennis Chris, MD on 10/27/2020 12:24 PM. (History)    pt is here on the referral of Dr. Karleen Hampshire for concern of DME OU, pt states she went to see Dr. 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 [place], [place] [address]  HISTORICAL INFORMATION:   Selected notes from the MEDICAL RECORD NUMBER Referred by Dr. Karleen Hampshire for diabetic eye exam LEE: [phone], BCVA OD: 20/50 OS: 20/70 Ocular Hx- DM, cataracts PMH- DM, cholesterol    CURRENT MEDICATIONS: No current outpatient medications on file. (Ophthalmic Drugs)   No current facility-administered medications for this visit. (Ophthalmic Drugs)   Current Outpatient Medications (Other)  Medication Sig  . acetaminophen (TYLENOL) 500 MG tablet Take 1,000 mg by mouth every 6 (six) hours as needed for mild pain or headache.   [name] aspirin 81 MG EC tablet Take 1 tablet (81 mg total) by mouth daily. Swallow whole.  [name] atorvastatin (LIPITOR) 80 MG tablet Take 1 tablet (80 mg total) by mouth daily.  [name] escitalopram (LEXAPRO) 10 MG tablet Take 1 tablet (10 mg total) by mouth daily. **PATIENT NEEDS OFFICE VISIT FOR ADDITIONAL REFILLS**  . gabapentin (NEURONTIN) 300 MG capsule TAKE 1 CAPSULE BY MOUTH THREE TIMES DAILY  . glimepiride (AMARYL) 4 MG tablet TAKE 2 TABLETS BY MOUTH ONCE DAILY WITH BREAKFAST  . isosorbide mononitrate (IMDUR) 30 MG 24 hr tablet Take 1.5 tablets (45 mg total) by mouth daily. (Patient taking differently: Take 30 mg by mouth daily.  12/20  Advised to increase dosage to 60 mg daily  - Marjie Skiff PA Takes (2)  30 mg tablets daily)  . metoprolol succinate (TOPROL-XL) 25 MG 24 hr tablet Take 1 tablet (25 mg total) by mouth 2 (two) times daily with a meal.  . Multiple Vitamins-Minerals (MULTIVITAMIN WOMEN PO) Take 1 tablet by mouth 3 (three) times a week.   . Semaglutide, 1 MG/DOSE, (OZEMPIC, 1 MG/DOSE,) 4 MG/3ML SOPN Inject 1 mg into the skin once a week. **PATIENT NEEDS OFFICE VISIT FOR ADDITIONAL REFILLS**  . sitaGLIPtin (JANUVIA) 100 MG tablet Take 1 tablet (100 mg total) by mouth daily.  . ticagrelor (BRILINTA) 90 MG TABS tablet Take 1 tablet (90 mg total) by mouth 2 (two) times daily.  . Vitamin D, Ergocalciferol,  (DRISDOL) 1.25 MG (50000 UNIT) CAPS capsule Take 1 capsule by mouth once a week   No current facility-administered medications for this visit. (Other)      REVIEW OF SYSTEMS: ROS    Positive for: Musculoskeletal, Endocrine, Cardiovascular, Eyes, Psychiatric   Negative for: Constitutional, Gastrointestinal, Neurological, Skin, Genitourinary, HENT, Respiratory, Allergic/Imm, Heme/Lymph   Last edited by Joni Reining, COA on 10/27/2020  9:03 AM. (History)       ALLERGIES Allergies  Allergen Reactions  . Nitroglycerin     Blood pressure dropped, was hospitalized     PAST MEDICAL HISTORY Past Medical History:  Diagnosis Date  . Arthritis   . Diabetes mellitus without complication (HCC)   . Hyperlipidemia   . Hypertension    Past Surgical History:  Procedure Laterality Date  . CARDIAC CATHETERIZATION    . CESAREAN SECTION  1990  . CORONARY STENT INTERVENTION N/A 05/20/2020   Procedure: CORONARY STENT INTERVENTION;  Surgeon: Kathleene Hazel, MD;  Location: [facility]VAS[facility];  Service: Cardiovascular;  Laterality: N/A;  . CORONARY STENT INTERVENTION N/A 07/22/2020   Procedure: CORONARY STENT INTERVENTION;  Surgeon: Kathleene Hazel, MD;  Location: [facility]AS[facility] CV [facility];  Service: Cardiovascular;  Laterality: N/A;  . LEFT HEART CATH AND CORONARY ANGIOGRAPHY N/A 05/20/2020   Procedure: LEFT HEART CATH AND CORONARY ANGIOGRAPHY;  Surgeon: Kathleene Hazel, MD;  Location: [facility]AS[facility] CV [facility];  Service: Cardiovascular;  Laterality: N/A;  . TRIGGER FINGER RELEASE Right    thumb    FAMILY HISTORY Family History  Problem Relation Age of Onset  . High blood pressure Mother   . Diabetes Mother   . Diabetes Maternal Grandmother   . High blood pressure Maternal Grandmother   . Heart attack Maternal Grandfather   . Diabetes Maternal Grandfather   . High blood pressure Maternal Grandfather   . High blood pressure Paternal Grandmother   . High blood pressure Paternal  Grandfather   . Diabetes Maternal Aunt   . Diabetes Maternal Uncle   . Skin cancer Maternal Uncle     SOCIAL HISTORY Social History   Tobacco Use  . Smoking status: Never Smoker  . Smokeless tobacco: Never Used  Vaping Use  . Vaping Use: Never used  Substance Use Topics  . Alcohol use: No  . Drug use: No         OPHTHALMIC EXAM:  Base Eye Exam    Visual Acuity (Snellen - Linear)      Right Left   Dist [place] 20/50 -2 20/50 -1   Dist ph  20/30 -2 20/40       Tonometry (Tonopen, 9:23 AM)      Right Left   Pressure 16 16  Pupils      Dark Light Shape React APD   Right 4 3 Round Brisk None   Left 4 3 Round Brisk None       Visual Fields (Counting fingers)      Left Right    Full Full       Extraocular Movement      Right Left    Full Full       Neuro/Psych    Oriented x3: Yes   Mood/Affect: Normal       Dilation    Both eyes: 1.0% Mydriacyl, 2.5% Phenylephrine @ 9:23 AM        Slit Lamp and Fundus Exam    Slit Lamp Exam      Right Left   Lids/Lashes Dermatochalasis - upper lid, Meibomian gland dysfunction, mild Telangiectasia Dermatochalasis - upper lid, Meibomian gland dysfunction, mild Telangiectasia   Conjunctiva/Sclera White and quiet White and quiet   Cornea Trace inferior Punctate epithelial erosions, tear film debris Trace inferior Punctate epithelial erosions, tear film debris   Anterior Chamber deep, clear, narrow temporal angle deep, clear, narrow temporal angle   Iris Round and dilated, No NVI Round and dilated, No NVI   Lens Clear, mild Cortical spokes Clear, mild Cortical spokes   Vitreous trace Vitreous syneresis trace Vitreous syneresis       Fundus Exam      Right Left   Disc Pink and Sharp Pink and Sharp, Compact   C/D Ratio 0.2 0.2   Macula Flat, Blunted foveal reflex, scattered MA/focal exudates, scattered cystic changes Blunted foveal reflex, scattered IRH/DBH, +edema temporal macula   Vessels Tortuous Tortuous, early  NV nasal to disc   Periphery Attached, scattered MA greatest posteriorly    Attached, scattered MA/DBH greatest posteriorly and nasally        Refraction    Manifest Refraction      Sphere Cylinder Dist VA   Right -0.75 Sphere 20/25-2   Left -0.75 Sphere 20/30-1          IMAGING AND PROCEDURES  Imaging and Procedures for 10/27/2020  OCT, Retina - OU - Both Eyes       Right Eye Quality was good. Central Foveal Thickness: 230. Progression has no prior data. Findings include normal foveal contour, intraretinal fluid, no SRF, intraretinal hyper-reflective material (Scattered non-central cystic changes).   Left Eye Quality was good. Central Foveal Thickness: 218. Progression has no prior data. Findings include normal foveal contour, intraretinal fluid, no SRF (+DME greatest temporal macula).   Notes *Images captured and stored on drive  Diagnosis / Impression:  DME OU (OS>OD)  Clinical management:  See below  Abbreviations: NFP - Normal foveal profile. CME - cystoid macular edema. PED - pigment epithelial detachment. IRF - intraretinal fluid. SRF - subretinal fluid. EZ - ellipsoid zone. ERM - epiretinal membrane. ORA - outer retinal atrophy. ORT - outer retinal tubulation. SRHM - subretinal hyper-reflective material. IRHM - intraretinal hyper-reflective material        Fluorescein Angiography Optos (Transit OS)       Right Eye   Progression has no prior data. Early phase findings include vascular perfusion defect, microaneurysm. Mid/Late phase findings include vascular perfusion defect, pigment epithelial detachment, leakage.   Left Eye   Progression has no prior data. Early phase findings include retinal neovascularization, microaneurysm, vascular perfusion defect. Mid/Late phase findings include leakage, microaneurysm, retinal neovascularization, vascular perfusion defect.   Notes **Images stored on drive**  Impression: OD: severe NPDR -- no  NV OS: PDR -- focal  NV nasal periphery Late leaking MA OU Vascular perfusion defects OU         Intravitreal Injection, Pharmacologic Agent - OS - Left Eye       Time Out 10/27/2020. 10:25 AM. Confirmed correct patient, procedure, site, and patient consented.   Anesthesia Topical anesthesia was used. Anesthetic medications included Lidocaine 2%, Proparacaine 0.5%.   Procedure Preparation included eyelid speculum, 5% betadine to ocular surface. A supplied needle was used.   Injection:  1.25 mg Bevacizumab (AVASTIN) 1.25mg /0.72mL SOLN   NDC: 57846-962-95, Lot: [id number] , Expiration date: 12/01/2020   Route: Intravitreal, Site: Left Eye, Waste: 0 mL  Post-op Post injection exam found visual acuity of at least counting fingers. The patient tolerated the procedure well. There were no complications. The patient received written and verbal post procedure care education.                 ASSESSMENT/PLAN:    ICD-10-CM   1. 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 Retinal edema  H35.81 OCT, Retina - OU - Both Eyes  3. 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 Essential hypertension  I10   5. Hypertensive retinopathy of both eyes  H35.033 Fluorescein Angiography Optos (Transit OS)    1,2. Proliferative diabetic retinopathy, OS - The incidence, risk factors for progression, natural history and treatment options for diabetic retinopathy were discussed with patient.   - The need for close monitoring of blood glucose, blood pressure, and serum lipids, avoiding cigarette or any type of tobacco, and the need for long term follow up was also discussed with patient. - exam shows scattered IRH, macular edema and early NV nasal to  disc - BCVA 20/40 OS - FA (02.02.22) shows focal NV nasal periphery OD - OCT shows diabetic macular edema, OU (OS>OD) - The natural history, pathology, and characteristics of diabetic macular edema discussed with patient.  A generalized discussion of the major clinical trials concerning treatment of diabetic macular edema (ETDRS, DCT, SCORE, RISE / RIDE, and ongoing DRCR net studies) was completed.  This discussion included mention of the various approaches to treating diabetic macular edema (observation, laser photocoagulation, anti-VEGF injections with lucentis / Avastin / Eylea, steroid injections with Kenalog / Ozurdex, and intraocular surgery with vitrectomy).  The goal hemoglobin A1C of 6-7 was discussed, as well as importance of smoking cessation and hypertension control.  Need for ongoing treatment and monitoring were specifically discussed with reference to chronic nature of diabetic macular edema. - recommend IVA OS #1 today, 02.02.22 for DME and PDR - pt wishes to proceed with injection - RBA of procedure discussed, questions answered - IVA informed consent obtained and signed, 02.02.22 (OS) - see procedure note - f/u in 1-2 wks -- DFE/OCT, PRP OS  3. Severe Non-proliferative diabetic retinopathy, OD - exam with scattered IRH - BCVA 20/25 OD - FA (02.02.22) shows no NV OS - OCT shows diabetic macular edema, OU (OS>OD) -- OD noncentral  - discussed findings - no intervention recommended at this time for OD  4,5. Hypertensive retinopathy OU - discussed importance of tight BP control - history of MI 04/2020 s/p stents - monitor  Ophthalmic Meds Ordered this visit:  Meds ordered this  encounter  Medications  . Bevacizumab (AVASTIN) SOLN 1.25 mg       Return for f/u 1-2 weeks, PDR OS, DFE, OCT.  There are no Patient Instructions on file for this visit.   Explained the diagnoses, plan, and follow up with the patient and they expressed understanding.  Patient expressed  understanding of the importance of proper follow up care.   This document serves as a record of services personally performed by Karie Chimera, MD, PhD. It was created on their behalf by Annalee Genta, COMT. The creation of this record is the provider's dictation and/or activities during the visit.  Electronically signed by: Annalee Genta, COMT 10/27/20 12:31 PM  Karie Chimera, M.D., Ph.D. Diseases & Surgery of the Retina and Vitreous [facility]  I have reviewed the above documentation for accuracy and completeness, and I agree with the above. Karie Chimera, M.D., Ph.D. 10/27/20 12:31 PM   Abbreviations: M myopia (nearsighted); A astigmatism; H hyperopia (farsighted); P presbyopia; Mrx spectacle prescription;  CTL contact lenses; OD right eye; OS left eye; OU both eyes  XT exotropia; ET esotropia; PEK punctate epithelial keratitis; PEE punctate epithelial erosions; DES dry eye syndrome; MGD meibomian gland dysfunction; ATs artificial tears; PFAT's preservative free artificial tears; NSC nuclear sclerotic cataract; PSC posterior subcapsular cataract; ERM epi-retinal membrane; PVD posterior vitreous detachment; RD retinal detachment; DM diabetes mellitus; DR diabetic retinopathy; NPDR non-proliferative diabetic retinopathy; PDR proliferative diabetic retinopathy; CSME clinically significant macular edema; DME diabetic macular edema; dbh dot blot hemorrhages; CWS cotton wool spot; POAG primary open angle glaucoma; C/D cup-to-disc ratio; HVF humphrey visual field; GVF goldmann visual field; OCT optical coherence tomography; IOP intraocular pressure; BRVO Branch retinal vein occlusion; CRVO central retinal vein occlusion; CRAO central retinal artery occlusion; BRAO branch retinal artery occlusion; RT retinal tear; SB scleral buckle; PPV pars plana vitrectomy; VH Vitreous hemorrhage; PRP panretinal laser photocoagulation; IVK intravitreal kenalog; VMT vitreomacular traction; MH  Macular hole;  NVD neovascularization of the disc; NVE neovascularization elsewhere; AREDS age related eye disease study; ARMD age related macular degeneration; POAG primary open angle glaucoma; EBMD epithelial/anterior basement membrane dystrophy; ACIOL anterior chamber intraocular lens; IOL intraocular lens; PCIOL posterior chamber intraocular lens; Phaco/IOL phacoemulsification with intraocular lens placement; PRK photorefractive keratectomy; LASIK laser assisted in situ keratomileusis; HTN hypertension; DM diabetes mellitus; COPD chronic obstructive pulmonary disease

## 2020-10-27 ENCOUNTER — Encounter (INDEPENDENT_AMBULATORY_CARE_PROVIDER_SITE_OTHER): Payer: Self-pay | Admitting: Ophthalmology

## 2020-10-27 ENCOUNTER — Other Ambulatory Visit: Payer: Self-pay

## 2020-10-27 ENCOUNTER — Ambulatory Visit (INDEPENDENT_AMBULATORY_CARE_PROVIDER_SITE_OTHER): Payer: BC Managed Care – PPO | Admitting: Ophthalmology

## 2020-10-27 DIAGNOSIS — H3581 Retinal edema: Secondary | ICD-10-CM | POA: Diagnosis not present

## 2020-10-27 DIAGNOSIS — E113512 Type 2 diabetes mellitus with proliferative diabetic retinopathy with macular edema, left eye: Secondary | ICD-10-CM

## 2020-10-27 DIAGNOSIS — I10 Essential (primary) hypertension: Secondary | ICD-10-CM | POA: Diagnosis not present

## 2020-10-27 DIAGNOSIS — E113311 Type 2 diabetes mellitus with moderate nonproliferative diabetic retinopathy with macular edema, right eye: Secondary | ICD-10-CM

## 2020-10-27 DIAGNOSIS — H35033 Hypertensive retinopathy, bilateral: Secondary | ICD-10-CM

## 2020-10-27 MED ORDER — BEVACIZUMAB CHEMO INJECTION 1.25MG/0.05ML SYRINGE FOR KALEIDOSCOPE
1.2500 mg | INTRAVITREAL | Status: AC | PRN
  Administered 2020-10-27: 1.25 mg via INTRAVITREAL

## 2020-11-09 NOTE — Progress Notes (Signed)
[facility] - Clinic Note  11/12/2020     CHIEF COMPLAINT Patient presents for Retina Follow Up   HISTORY OF PRESENT ILLNESS: Sarah Kane is a 50 y.o. female who presents to the clinic today for:   HPI    Retina Follow Up    Patient presents with  Diabetic Retinopathy.  In both eyes.  Duration of 2 weeks.  Since onset it is stable.  I, the attending physician,  performed the HPI with the patient and updated documentation appropriately.          Comments    Pt states vision has been stable since last visit, she is prepared for PRP OS today, pt has been out of Januvia, she has an appt with her PCP on March 3       Last edited by Rennis Chris, MD on 11/12/2020 10:13 AM. (History)    pt is   Referring physician: Aura Camps MD [address] [place], [place] [address]  HISTORICAL INFORMATION:   Selected notes from the MEDICAL RECORD NUMBER Referred by Dr. Karleen Hampshire for diabetic eye exam LEE: [phone], BCVA OD: 20/50 OS: 20/70 Ocular Hx- DM, cataracts PMH- DM, cholesterol    CURRENT MEDICATIONS: Current Outpatient Medications (Ophthalmic Drugs)  Medication Sig  . prednisoLONE acetate (PRED FORTE) 1 % ophthalmic suspension Place 1 drop into the left eye 4 (four) times daily for 7 days.   No current facility-administered medications for this visit. (Ophthalmic Drugs)   Current Outpatient Medications (Other)  Medication Sig  . acetaminophen (TYLENOL) 500 MG tablet Take 1,000 mg by mouth every 6 (six) hours as needed for mild pain or headache.   [name] aspirin 81 MG EC tablet Take 1 tablet (81 mg total) by mouth daily. Swallow whole.  [name] atorvastatin (LIPITOR) 80 MG tablet Take 1 tablet (80 mg total) by mouth daily.  [name] escitalopram (LEXAPRO) 10 MG tablet TAKE 1 TABLET BY MOUTH ONCE DAILY . APPOINTMENT REQUIRED FOR FUTURE REFILLS  . gabapentin (NEURONTIN) 300 MG capsule TAKE 1 CAPSULE BY MOUTH THREE TIMES DAILY  . glimepiride (AMARYL) 4 MG tablet TAKE 2  TABLETS BY MOUTH ONCE DAILY WITH BREAKFAST  . isosorbide mononitrate (IMDUR) 30 MG 24 hr tablet Take 1.5 tablets (45 mg total) by mouth daily. (Patient taking differently: Take 30 mg by mouth daily. 12/20  Advised to increase dosage to 60 mg daily  - Marjie Skiff PA Takes (2)  30 mg tablets daily)  . JANUVIA 100 MG tablet Take 1 tablet by mouth once daily  . metoprolol succinate (TOPROL-XL) 25 MG 24 hr tablet Take 1 tablet (25 mg total) by mouth 2 (two) times daily with a meal.  . Multiple Vitamins-Minerals (MULTIVITAMIN WOMEN PO) Take 1 tablet by mouth 3 (three) times a week.   . Semaglutide, 1 MG/DOSE, (OZEMPIC, 1 MG/DOSE,) 4 MG/3ML SOPN Inject 1 mg into the skin once a week. **PATIENT NEEDS OFFICE VISIT FOR ADDITIONAL REFILLS**  . ticagrelor (BRILINTA) 90 MG TABS tablet Take 1 tablet (90 mg total) by mouth 2 (two) times daily.  . Vitamin D, Ergocalciferol, (DRISDOL) 1.25 MG (50000 UNIT) CAPS capsule Take 1 capsule by mouth once a week   No current facility-administered medications for this visit. (Other)      REVIEW OF SYSTEMS: ROS    Positive for: Endocrine, Cardiovascular, Eyes   Negative for: Constitutional, Gastrointestinal, Neurological, Skin, Genitourinary, Musculoskeletal, HENT, Respiratory, Psychiatric, Allergic/Imm, Heme/Lymph   Last edited by Posey Boyer, COT on  11/12/2020  8:58 AM. (History)       ALLERGIES Allergies  Allergen Reactions  . Nitroglycerin     Blood pressure dropped, was hospitalized     PAST MEDICAL HISTORY Past Medical History:  Diagnosis Date  . Arthritis   . Diabetes mellitus without complication (HCC)   . Hyperlipidemia   . Hypertension    Past Surgical History:  Procedure Laterality Date  . CARDIAC CATHETERIZATION    . CESAREAN SECTION  1990  . CORONARY STENT INTERVENTION N/A 05/20/2020   Procedure: CORONARY STENT INTERVENTION;  Surgeon: Kathleene Hazel, MD;  Location: [facility];  Service: Cardiovascular;  Laterality:  N/A;  . CORONARY STENT INTERVENTION N/A 07/22/2020   Procedure: CORONARY STENT INTERVENTION;  Surgeon: Kathleene Hazel, MD;  Location: [facility];  Service: Cardiovascular;  Laterality: N/A;  . LEFT HEART CATH AND CORONARY ANGIOGRAPHY N/A 05/20/2020   Procedure: LEFT HEART CATH AND CORONARY ANGIOGRAPHY;  Surgeon: Kathleene Hazel, MD;  Location: [facility];  Service: Cardiovascular;  Laterality: N/A;  . TRIGGER FINGER RELEASE Right    thumb    FAMILY HISTORY Family History  Problem Relation Age of Onset  . High blood pressure Mother   . Diabetes Mother   . Diabetes Maternal Grandmother   . High blood pressure Maternal Grandmother   . Heart attack Maternal Grandfather   . Diabetes Maternal Grandfather   . High blood pressure Maternal Grandfather   . High blood pressure Paternal Grandmother   . High blood pressure Paternal Grandfather   . Diabetes Maternal Aunt   . Diabetes Maternal Uncle   . Skin cancer Maternal Uncle     SOCIAL HISTORY Social History   Tobacco Use  . Smoking status: Never Smoker  . Smokeless tobacco: Never Used  Vaping Use  . Vaping Use: Never used  Substance Use Topics  . Alcohol use: No  . Drug use: No         OPHTHALMIC EXAM:  Base Eye Exam    Visual Acuity (Snellen - Linear)      Right Left   Dist [place] 20/25 -2 20/40   Dist ph [place] 20/25 +2 20/40       Tonometry (Tonopen, 9:10 AM)      Right Left   Pressure 15 11       Pupils      Dark Light Shape React APD   Right 4 2 Round Brisk None   Left 4 2 Round Brisk None       Visual Fields (Counting fingers)      Left Right    Full Full       Extraocular Movement      Right Left    Full, Ortho Full, Ortho       Neuro/Psych    Oriented x3: Yes   Mood/Affect: Normal       Dilation    Left eye: 1.0% Mydriacyl, 2.5% Phenylephrine @ 9:10 AM        Slit Lamp and Fundus Exam    Slit Lamp Exam      Right Left   Lids/Lashes Dermatochalasis - upper lid,  Meibomian gland dysfunction, mild Telangiectasia Dermatochalasis - upper lid, Meibomian gland dysfunction, mild Telangiectasia   Conjunctiva/Sclera White and quiet White and quiet   Cornea Trace inferior Punctate epithelial erosions, tear film debris Trace inferior Punctate epithelial erosions, tear film debris   Anterior Chamber deep, clear, narrow temporal angle deep, clear, narrow temporal  angle   Iris Round and dilated, No NVI Round and dilated, No NVI   Lens Clear, mild Cortical spokes Clear, mild Cortical spokes   Vitreous trace Vitreous syneresis trace Vitreous syneresis       Fundus Exam      Right Left   Disc Pink and Sharp Pink and Sharp, Compact   C/D Ratio 0.2 0.2   Macula Flat, Blunted foveal reflex, scattered MA/focal exudates, scattered cystic changes Blunted foveal reflex, scattered IRH/DBH, +edema temporal macula   Vessels Tortuous Tortuous, early NV nasal to disc   Periphery Attached, scattered MA greatest posteriorly    Attached, scattered MA/DBH greatest posteriorly and nasally          IMAGING AND PROCEDURES  Imaging and Procedures for 11/12/2020  OCT, Retina - OU - Both Eyes       Right Eye Quality was good. Central Foveal Thickness: 224. Progression has improved. Findings include normal foveal contour, intraretinal fluid, no SRF, intraretinal hyper-reflective material (Mild interval improvement in cystic changes temporal and superior macula).   Left Eye Quality was good. Central Foveal Thickness: 214. Progression has improved. Findings include normal foveal contour, intraretinal fluid, no SRF (Interval improvement in temporal IRF/edema).   Notes *Images captured and stored on drive  Diagnosis / Impression:  DME OU (OS>OD) OD: Mild interval improvement in cystic changes temporal and superior macula OS: Interval improvement in temporal IRF/edema  Clinical management:  See below  Abbreviations: NFP - Normal foveal profile. CME - cystoid macular edema.  PED - pigment epithelial detachment. IRF - intraretinal fluid. SRF - subretinal fluid. EZ - ellipsoid zone. ERM - epiretinal membrane. ORA - outer retinal atrophy. ORT - outer retinal tubulation. SRHM - subretinal hyper-reflective material. IRHM - intraretinal hyper-reflective material        Panretinal Photocoagulation - OS - Left Eye       LASER PROCEDURE NOTE  Diagnosis:   Proliferative Diabetic Retinopathy, LEFT EYE  Procedure:  Pan-retinal photocoagulation using slit lamp laser, LEFT EYE  Anesthesia:  Topical  Surgeon: Rennis Chris, MD, PhD   Informed consent obtained, operative eye marked, and time out performed prior to initiation of laser.   Lumenis [id number] slit lamp laser Pattern: 3x3 square Power: 250-260 mW Duration: 30 msec  Spot size: 200 microns  # spots: 1848 spots  Complications: None.  RTC: Mar 2 or later for DFE/OCT, possible injection(s)  Patient tolerated the procedure well and received written and verbal post-procedure care information/education.                  ASSESSMENT/PLAN:    ICD-10-CM   1. 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 Retinal edema  H35.81 OCT, Retina - OU - Both Eyes  3. 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 Essential hypertension  I10   5. Hypertensive retinopathy of both eyes  H35.033     1,2. Proliferative diabetic retinopathy, OS  - s/p IVA OS #1 (02.02.22) - exam shows scattered IRH, macular edema and early NV nasal to disc - BCVA 20/40 OS - FA (02.02.22) shows focal NV nasal periphery OD - OCT shows diabetic macular edema, OU (OS>OD) - recommend PRP OS today, 02.18.22 for PDR and NVE OS - pt wishes to proceed with laser - RBA of procedure discussed, questions  answered - start  PF QID OS x7 days - IVA informed consent obtained and signed, 02.02.22 (OS) - see procedure note - f/u Mar 2 or later -- DFE, OCT, possible injection  3. Severe Non-proliferative diabetic retinopathy, OD - exam with scattered IRH - BCVA 20/25 OD - FA (02.02.22) shows no NV OS - OCT shows diabetic macular edema, OU (OS>OD) -- OD noncentral  - discussed findings - no intervention recommended at this time for OD  4,5. Hypertensive retinopathy OU - discussed importance of tight BP control - history of MI 04/2020 s/p stents - monitor  Ophthalmic Meds Ordered this visit:  Meds ordered this encounter  Medications  . prednisoLONE acetate (PRED FORTE) 1 % ophthalmic suspension    Sig: Place 1 drop into the left eye 4 (four) times daily for 7 days.    Dispense:  10 mL    Refill:  0       Return in about 12 days (around 11/24/2020) for PDR w/ DME OS - Dilated Exam, OCT, Possible Injxn.  There are no Patient Instructions on file for this visit.   Explained the diagnoses, plan, and follow up with the patient and they expressed understanding.  Patient expressed understanding of the importance of proper follow up care.   This document serves as a record of services personally performed by Karie Chimera, MD, PhD. It was created on their behalf by Glee Arvin. Manson Passey, OA an ophthalmic technician. The creation of this record is the provider's dictation and/or activities during the visit.    Electronically signed by: Glee Arvin. Manson Passey, [place] 02.15.2022 10:18 AM  Karie Chimera, M.D., Ph.D. Diseases & Surgery of the Retina and Vitreous [facility]  I have reviewed the above documentation for accuracy and completeness, and I agree with the above. Karie Chimera, M.D., Ph.D. 11/12/20 10:18 AM  Abbreviations: M myopia (nearsighted); A astigmatism; H hyperopia (farsighted); P presbyopia; Mrx spectacle prescription;  CTL contact lenses; OD right eye; OS left eye; OU both eyes  XT  exotropia; ET esotropia; PEK punctate epithelial keratitis; PEE punctate epithelial erosions; DES dry eye syndrome; MGD meibomian gland dysfunction; ATs artificial tears; PFAT's preservative free artificial tears; NSC nuclear sclerotic cataract; PSC posterior subcapsular cataract; ERM epi-retinal membrane; PVD posterior vitreous detachment; RD retinal detachment; DM diabetes mellitus; DR diabetic retinopathy; NPDR non-proliferative diabetic retinopathy; PDR proliferative diabetic retinopathy; CSME clinically significant macular edema; DME diabetic macular edema; dbh dot blot hemorrhages; CWS cotton wool spot; POAG primary open angle glaucoma; C/D cup-to-disc ratio; HVF humphrey visual field; GVF goldmann visual field; OCT optical coherence tomography; IOP intraocular pressure; BRVO Branch retinal vein occlusion; CRVO central retinal vein occlusion; CRAO central retinal artery occlusion; BRAO branch retinal artery occlusion; RT retinal tear; SB scleral buckle; PPV pars plana vitrectomy; VH Vitreous hemorrhage; PRP panretinal laser photocoagulation; IVK intravitreal kenalog; VMT vitreomacular traction; MH Macular hole;  NVD neovascularization of the disc; NVE neovascularization elsewhere; AREDS age related eye disease study; ARMD age related macular degeneration; POAG primary open angle glaucoma; EBMD epithelial/anterior basement membrane dystrophy; ACIOL anterior chamber intraocular lens; IOL intraocular lens; PCIOL posterior chamber intraocular lens; Phaco/IOL phacoemulsification with intraocular lens placement; PRK photorefractive keratectomy; LASIK laser assisted in situ keratomileusis; HTN hypertension; DM diabetes mellitus; COPD chronic obstructive pulmonary disease

## 2020-11-11 ENCOUNTER — Other Ambulatory Visit: Payer: Self-pay | Admitting: Osteopathic Medicine

## 2020-11-11 DIAGNOSIS — F418 Other specified anxiety disorders: Secondary | ICD-10-CM

## 2020-11-12 ENCOUNTER — Other Ambulatory Visit: Payer: Self-pay

## 2020-11-12 ENCOUNTER — Encounter (INDEPENDENT_AMBULATORY_CARE_PROVIDER_SITE_OTHER): Payer: Self-pay | Admitting: Ophthalmology

## 2020-11-12 ENCOUNTER — Ambulatory Visit (INDEPENDENT_AMBULATORY_CARE_PROVIDER_SITE_OTHER): Payer: BC Managed Care – PPO | Admitting: Ophthalmology

## 2020-11-12 DIAGNOSIS — E113311 Type 2 diabetes mellitus with moderate nonproliferative diabetic retinopathy with macular edema, right eye: Secondary | ICD-10-CM

## 2020-11-12 DIAGNOSIS — H35033 Hypertensive retinopathy, bilateral: Secondary | ICD-10-CM

## 2020-11-12 DIAGNOSIS — H3581 Retinal edema: Secondary | ICD-10-CM

## 2020-11-12 DIAGNOSIS — I10 Essential (primary) hypertension: Secondary | ICD-10-CM

## 2020-11-12 DIAGNOSIS — E113512 Type 2 diabetes mellitus with proliferative diabetic retinopathy with macular edema, left eye: Secondary | ICD-10-CM

## 2020-11-12 MED ORDER — PREDNISOLONE ACETATE 1 % OP SUSP: 1.0000 [drp] | Freq: Four times a day (QID) | OPHTHALMIC | 0 refills | Status: AC

## 2020-11-17 NOTE — Progress Notes (Signed)
[facility] - Clinic Note  11/26/2020     CHIEF COMPLAINT Patient presents for Retina Follow Up   HISTORY OF PRESENT ILLNESS: Sarah Kane is a 50 y.o. female who presents to the clinic today for:   HPI    Retina Follow Up    Patient presents with  Diabetic Retinopathy.  In both eyes.  Severity is moderate.  Duration of 2 weeks.  Since onset it is stable.  I, the attending physician,  performed the HPI with the patient and updated documentation appropriately.          Comments    Pt here for 2 week f/u for PDR OU, pt states no problems after laser procedure at last visit except for a headache, she used the drops as directed, she states vision is the same as last time       Last edited by Rennis Chris, MD on 11/26/2020  9:07 AM. (History)    pt is   Referring physician: Aura Camps MD [address] [place], [place] [address]  HISTORICAL INFORMATION:   Selected notes from the MEDICAL RECORD NUMBER Referred by Dr. Karleen Hampshire for diabetic eye exam LEE: [phone], BCVA OD: 20/50 OS: 20/70 Ocular Hx- DM, cataracts PMH- DM, cholesterol    CURRENT MEDICATIONS: No current outpatient medications on file. (Ophthalmic Drugs)   No current facility-administered medications for this visit. (Ophthalmic Drugs)   Current Outpatient Medications (Other)  Medication Sig  . acetaminophen (TYLENOL) 500 MG tablet Take 1,000 mg by mouth every 6 (six) hours as needed for mild pain or headache.   [name] aspirin 81 MG EC tablet Take 1 tablet (81 mg total) by mouth daily. Swallow whole.  [name] atorvastatin (LIPITOR) 80 MG tablet Take 1 tablet (80 mg total) by mouth daily.  [name] escitalopram (LEXAPRO) 10 MG tablet TAKE 1 TABLET BY MOUTH ONCE DAILY . APPOINTMENT REQUIRED FOR FUTURE REFILLS  . gabapentin (NEURONTIN) 300 MG capsule TAKE 1 CAPSULE BY MOUTH THREE TIMES DAILY  . glimepiride (AMARYL) 4 MG tablet TAKE 2 TABLETS BY MOUTH ONCE DAILY WITH BREAKFAST  . isosorbide mononitrate  (IMDUR) 30 MG 24 hr tablet Take 1.5 tablets (45 mg total) by mouth daily. (Patient taking differently: Take 30 mg by mouth daily. 12/20  Advised to increase dosage to 60 mg daily  - Marjie Skiff PA Takes (2)  30 mg tablets daily)  . metoprolol succinate (TOPROL-XL) 25 MG 24 hr tablet Take 1 tablet (25 mg total) by mouth 2 (two) times daily with a meal.  . Multiple Vitamins-Minerals (MULTIVITAMIN WOMEN PO) Take 1 tablet by mouth 3 (three) times a week.   . prazosin (MINIPRESS) 2 MG capsule Take 1 capsule (2 mg total) by mouth at bedtime.  . Semaglutide, 1 MG/DOSE, (OZEMPIC, 1 MG/DOSE,) 4 MG/3ML SOPN Inject 1 mg into the skin once a week. **PATIENT NEEDS OFFICE VISIT FOR ADDITIONAL REFILLS**  . sitaGLIPtin (JANUVIA) 100 MG tablet Take 1 tablet (100 mg total) by mouth daily.  . ticagrelor (BRILINTA) 90 MG TABS tablet Take 1 tablet (90 mg total) by mouth 2 (two) times daily.  . Vitamin D, Ergocalciferol, (DRISDOL) 1.25 MG (50000 UNIT) CAPS capsule Take 1 capsule by mouth once a week   No current facility-administered medications for this visit. (Other)      REVIEW OF SYSTEMS: ROS    Positive for: Endocrine, Cardiovascular, Eyes   Negative for: Constitutional, Gastrointestinal, Neurological, Skin, Genitourinary, Musculoskeletal, HENT, Respiratory, Psychiatric, Allergic/Imm, Heme/Lymph   Last  edited by Posey Boyer, COT on 11/26/2020  8:05 AM. (History)       ALLERGIES Allergies  Allergen Reactions  . Nitroglycerin     Blood pressure dropped, was hospitalized     PAST MEDICAL HISTORY Past Medical History:  Diagnosis Date  . Arthritis   . Diabetes mellitus without complication (HCC)   . Hyperlipidemia   . Hypertension    Past Surgical History:  Procedure Laterality Date  . CARDIAC CATHETERIZATION    . CESAREAN SECTION  1990  . CORONARY STENT INTERVENTION N/A 05/20/2020   Procedure: CORONARY STENT INTERVENTION;  Surgeon: Kathleene Hazel, MD;  [facility];  Service: Cardiovascular;  Laterality: N/A;  . CORONARY STENT INTERVENTION N/A 07/22/2020   Procedure: CORONARY STENT INTERVENTION;  Surgeon: Kathleene Hazel, MD;  Location: [facility];  Service: Cardiovascular;  Laterality: N/A;  . LEFT HEART CATH AND CORONARY ANGIOGRAPHY N/A 05/20/2020   Procedure: LEFT HEART CATH AND CORONARY ANGIOGRAPHY;  Surgeon: Kathleene Hazel, MD;  [facility]: [facility];  Service: Cardiovascular;  Laterality: N/A;  . TRIGGER FINGER RELEASE Right    thumb    FAMILY HISTORY Family History  Problem Relation Age of Onset  . High blood pressure Mother   . Diabetes Mother   . Diabetes Maternal Grandmother   . High blood pressure Maternal Grandmother   . Heart attack Maternal Grandfather   . Diabetes Maternal Grandfather   . High blood pressure Maternal Grandfather   . High blood pressure Paternal Grandmother   . High blood pressure Paternal Grandfather   . Diabetes Maternal Aunt   . Diabetes Maternal Uncle   . Skin cancer Maternal Uncle     SOCIAL HISTORY Social History   Tobacco Use  . Smoking status: Never Smoker  . Smokeless tobacco: Never Used  Vaping Use  . Vaping Use: Never used  Substance Use Topics  . Alcohol use: No  . Drug use: No         OPHTHALMIC EXAM:  Base Eye Exam    Visual Acuity (Snellen - Linear)      Right Left   Dist [place] 20/25 20/30 +1   Dist ph [place] 20/20 -2 20/30 +2       Tonometry (Tonopen, 8:16 AM)      Right Left   Pressure 19 14       Pupils      Dark Light Shape React APD   Right 4 2 Round Brisk None   Left 4 2 Round Brisk None       Visual Fields (Counting fingers)      Left Right    Full Full       Extraocular Movement      Right Left    Full, Ortho Full, Ortho       Neuro/Psych    Oriented x3: Yes   Mood/Affect: Normal       Dilation    Right eye:         Slit Lamp and Fundus Exam    Slit Lamp Exam      Right Left   Lids/Lashes Dermatochalasis - upper  lid, Meibomian gland dysfunction, mild Telangiectasia Dermatochalasis - upper lid, Meibomian gland dysfunction, mild Telangiectasia   Conjunctiva/Sclera White and quiet White and quiet   Cornea Trace inferior Punctate epithelial erosions, tear film debris Trace inferior Punctate epithelial erosions, tear film debris   Anterior Chamber deep, clear, narrow temporal angle deep, clear,  narrow temporal angle   Iris Round and dilated, No NVI Round and dilated, No NVI   Lens Cortical spokes Cortical spokes   Vitreous trace Vitreous syneresis trace Vitreous syneresis       Fundus Exam      Right Left   Disc Pink and Sharp, Compact Pink and Sharp, Compact   C/D Ratio 0.2 0.2   Macula Flat, good foveal reflex, scattered MA/DBH greatest temporal macula Blunted foveal reflex, scattered IRH/DBH, +edema temporal macula   Vessels attenuated, Tortuous early NV nasal to disc - regressing, attenuated, mild tortuousity   Periphery Attached, scattered MA/DBH greatest posteriorly    Attached, scattered MA/DBH greatest posteriorly and nasally, good early 360 PRP changes          IMAGING AND PROCEDURES  Imaging and Procedures for 11/26/2020  OCT, Retina - OU - Both Eyes       Right Eye Quality was good. Central Foveal Thickness: 229. Progression has been stable. Findings include normal foveal contour, intraretinal fluid, no SRF, intraretinal hyper-reflective material (Persistent non-central cystic changes ).   Left Eye Quality was good. Central Foveal Thickness: 223. Progression has worsened. Findings include normal foveal contour, intraretinal fluid, no SRF (Mild interval increase in temporal edema).   Notes *Images captured and stored on drive  Diagnosis / Impression:  DME OU (OS>OD) OD: Persistent non-central cystic changes  OS: Mild interval increase in temporal edema  Clinical management:  See below  Abbreviations: NFP - Normal foveal profile. CME - cystoid macular edema. PED - pigment  epithelial detachment. IRF - intraretinal fluid. SRF - subretinal fluid. EZ - ellipsoid zone. ERM - epiretinal membrane. ORA - outer retinal atrophy. ORT - outer retinal tubulation. SRHM - subretinal hyper-reflective material. IRHM - intraretinal hyper-reflective material        Intravitreal Injection, Pharmacologic Agent - OS - Left Eye       Time Out 11/26/2020. 8:24 AM. Confirmed correct patient, procedure, site, and patient consented.   Anesthesia Topical anesthesia was used. Anesthetic medications included Lidocaine 2%, Proparacaine 0.5%.   Procedure Preparation included eyelid speculum, 5% betadine to ocular surface. A supplied needle was used.   Injection:  1.25 mg Bevacizumab (AVASTIN) 1.25mg /0.3mL SOLN   NDC: 16109-604-54, Lot: [id number] , Expiration date: 01/05/2021   Route: Intravitreal, Site: Left Eye, Waste: 0 mL  Post-op Post injection exam found visual acuity of at least counting fingers. The patient tolerated the procedure well. There were no complications. The patient received written and verbal post procedure care education.                 ASSESSMENT/PLAN:    ICD-10-CM   1. Proliferative diabetic retinopathy of left eye with macular edema associated with type 2 diabetes mellitus (HCC)  W23.7628 Intravitreal Injection, Pharmacologic Agent - OS - Left Eye    Bevacizumab (AVASTIN) SOLN 1.25 mg  2. Retinal edema  H35.81 OCT, Retina - OU - Both Eyes  3. 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 Essential hypertension  I10   5. Hypertensive retinopathy of both eyes  H35.033     1,2. Proliferative diabetic retinopathy, OS  - s/p IVA OS #1 (02.02.22)  - s/p PRP OS (02.18.22) - exam shows scattered IRH, macular edema and early NV nasal to disc - BCVA 20/40 OS - FA (02.02.22) shows focal NV nasal periphery OD  - OCT shows OD: Persistent non-central cystic  changes; OS:  Mild interval increase in temporal edema - recommend IVA OS #2 today, 03.04.22 for PDR and NVE OS - pt wishes to proceed with injection - RBA of procedure discussed, questions answered - IVA informed consent obtained and signed, 02.02.22 (OS) - see procedure note - f/u 4 weeks -- DFE, OCT, possible injection  3. Severe Non-proliferative diabetic retinopathy, OD - exam with scattered IRH - BCVA 20/25 OD - FA (02.02.22) shows no NV OS - OCT shows diabetic macular edema, OU (OS>OD) -- OD noncentral  - discussed findings - no intervention recommended at this time for OD  4,5. Hypertensive retinopathy OU - discussed importance of tight BP control - history of MI 04/2020 s/p stents - monitor  Ophthalmic Meds Ordered this visit:  Meds ordered this encounter  Medications  . Bevacizumab (AVASTIN) SOLN 1.25 mg       Return in about 4 weeks (around 12/24/2020) for f/u PDR OU, DFE, OCT.  There are no Patient Instructions on file for this visit.   Explained the diagnoses, plan, and follow up with the patient and they expressed understanding.  Patient expressed understanding of the importance of proper follow up care.   This document serves as a record of services personally performed by Karie Chimera, MD, PhD. It was created on their behalf by Glee Arvin. Manson Passey, OA an ophthalmic technician. The creation of this record is the provider's dictation and/or activities during the visit.    Electronically signed by: Glee Arvin. Manson Passey, [place] 02.23.2022 9:14 AM  Karie Chimera, M.D., Ph.D. Diseases & Surgery of the Retina and Vitreous [facility]  I have reviewed the above documentation for accuracy and completeness, and I agree with the above. Karie Chimera, M.D., Ph.D. 11/26/20 9:14 AM   Abbreviations: M myopia (nearsighted); A astigmatism; H hyperopia (farsighted); P presbyopia; Mrx spectacle prescription;  CTL contact lenses; OD right eye; OS left eye; OU  both eyes  XT exotropia; ET esotropia; PEK punctate epithelial keratitis; PEE punctate epithelial erosions; DES dry eye syndrome; MGD meibomian gland dysfunction; ATs artificial tears; PFAT's preservative free artificial tears; NSC nuclear sclerotic cataract; PSC posterior subcapsular cataract; ERM epi-retinal membrane; PVD posterior vitreous detachment; RD retinal detachment; DM diabetes mellitus; DR diabetic retinopathy; NPDR non-proliferative diabetic retinopathy; PDR proliferative diabetic retinopathy; CSME clinically significant macular edema; DME diabetic macular edema; dbh dot blot hemorrhages; CWS cotton wool spot; POAG primary open angle glaucoma; C/D cup-to-disc ratio; HVF humphrey visual field; GVF goldmann visual field; OCT optical coherence tomography; IOP intraocular pressure; BRVO Branch retinal vein occlusion; CRVO central retinal vein occlusion; CRAO central retinal artery occlusion; BRAO branch retinal artery occlusion; RT retinal tear; SB scleral buckle; PPV pars plana vitrectomy; VH Vitreous hemorrhage; PRP panretinal laser photocoagulation; IVK intravitreal kenalog; VMT vitreomacular traction; MH Macular hole;  NVD neovascularization of the disc; NVE neovascularization elsewhere; AREDS age related eye disease study; ARMD age related macular degeneration; POAG primary open angle glaucoma; EBMD epithelial/anterior basement membrane dystrophy; ACIOL anterior chamber intraocular lens; IOL intraocular lens; PCIOL posterior chamber intraocular lens; Phaco/IOL phacoemulsification with intraocular lens placement; PRK photorefractive keratectomy; LASIK laser assisted in situ keratomileusis; HTN hypertension; DM diabetes mellitus; COPD chronic obstructive pulmonary disease

## 2020-11-25 ENCOUNTER — Ambulatory Visit (INDEPENDENT_AMBULATORY_CARE_PROVIDER_SITE_OTHER): Payer: BC Managed Care – PPO | Admitting: Osteopathic Medicine

## 2020-11-25 ENCOUNTER — Encounter: Payer: Self-pay | Admitting: Osteopathic Medicine

## 2020-11-25 ENCOUNTER — Other Ambulatory Visit: Payer: Self-pay

## 2020-11-25 VITALS — BP 122/85 | HR 79 | Temp 97.6°F | Wt 178.0 lb

## 2020-11-25 DIAGNOSIS — E785 Hyperlipidemia, unspecified: Secondary | ICD-10-CM

## 2020-11-25 DIAGNOSIS — F515 Nightmare disorder: Secondary | ICD-10-CM

## 2020-11-25 DIAGNOSIS — I10 Essential (primary) hypertension: Secondary | ICD-10-CM | POA: Diagnosis not present

## 2020-11-25 DIAGNOSIS — E1165 Type 2 diabetes mellitus with hyperglycemia: Secondary | ICD-10-CM

## 2020-11-25 DIAGNOSIS — F418 Other specified anxiety disorders: Secondary | ICD-10-CM | POA: Diagnosis not present

## 2020-11-25 MED ORDER — SITAGLIPTIN PHOSPHATE 100 MG PO TABS
100.0000 mg | ORAL_TABLET | Freq: Every day | ORAL | 3 refills | Status: DC
Start: 2020-11-25 — End: 2021-09-30

## 2020-11-25 MED ORDER — PRAZOSIN HCL 2 MG PO CAPS: 2.0000 mg | ORAL_CAPSULE | Freq: Every day | ORAL | 0 refills | Status: DC

## 2020-11-25 NOTE — Patient Instructions (Signed)
Starting Prazosin Rx at bedtime for sleep/nightmares If helping, can continue this for at least a few months If not helping after 1 week, can increase to 2 capsules (4 mg total) at bedtime If higher dose not helping, will stop this Rx and talk about increasing Lexapro   Due for labs!

## 2020-11-25 NOTE — Progress Notes (Signed)
Sarah Kane is a 50 y.o. female who presents to  [facility] at [facility]  today, 11/25/20, seeking care for the following:  [name] Mental health: having nightmares again since going back to work, having anxiety during her work day and nervous about cardiac issues. Thinks lexapro not helping.  . DM2: needs refill on Januvia  . HTN: BP not at goal      ASSESSMENT & PLAN with other pertinent findings:  The primary encounter diagnosis was Nightmares. Diagnoses of Anxiety with depression, Hyperlipidemia LDL goal <70, Essential hypertension, and Type 2 diabetes mellitus with hyperglycemia, without long-term current use of insulin (HCC) were also pertinent to this visit.    Patient Instructions  Starting Prazosin Rx at bedtime for sleep/nightmares If helping, can continue this for at least a few months If not helping after 1 week, can increase to 2 capsules (4 mg total) at bedtime If higher dose not helping, will stop this Rx and talk about increasing Lexapro   Due for labs!     Orders Placed This Encounter  Procedures  . CBC  . COMPLETE METABOLIC PANEL WITH GFR  . Lipid panel  . Hemoglobin A1c    Meds ordered this encounter  Medications  . prazosin (MINIPRESS) 2 MG capsule    Sig: Take 1 capsule (2 mg total) by mouth at bedtime.    Dispense:  90 capsule    Refill:  0  . sitaGLIPtin (JANUVIA) 100 MG tablet    Sig: Take 1 tablet (100 mg total) by mouth daily.    Dispense:  90 tablet    Refill:  3     See below for relevant physical exam findings  See below for recent lab and imaging results reviewed  Medications, allergies, PMH, PSH, SocH, FamH reviewed below    Follow-up instructions: Return in about 2 weeks (around 12/09/2020) for REVIEW LAB RESULTS, RECHECK MENTAL HEALTH .                                        Exam:  BP 122/85 (BP Location: Left Arm, Patient Position: Sitting, Cuff Size:  Normal)   Pulse 79   Temp 97.6 F (36.4 C) (Oral)   Wt 178 lb 0.6 oz (80.8 kg)   BMI 34.77 kg/m   Constitutional: VS see above. General Appearance: alert, well-developed, well-nourished, NAD  Neck: No masses, trachea midline.   Respiratory: Normal respiratory effort. no wheeze, no rhonchi, no rales  Cardiovascular: S1/S2 normal, no murmur, no rub/gallop auscultated. RRR.   Musculoskeletal: Gait normal. Symmetric and independent movement of all extremities  Neurological: Normal balance/coordination. No tremor.  Skin: warm, dry, intact.   Psychiatric: Normal judgment/insight. Normal mood and affect. Oriented x3.   Depression screen [facility] 2/9 08/24/2020 11/06/2019 07/23/2019  Decreased Interest 0 3 3  Down, Depressed, Hopeless PHQ - 2 Score Altered sleeping - 3 3  Tired, decreased energy - 3 3  Change in appetite - 3 3  Feeling bad or failure about yourself  - 3 3  Trouble concentrating - 0 0  Moving slowly or fidgety/restless - 0 3  Suicidal thoughts - 1 0  PHQ-9 Score - 19 21  Difficult doing work/chores - Very difficult -    Current Meds  Medication Sig  . acetaminophen (TYLENOL) 500 MG tablet Take 1,000  mg by mouth every 6 (six) hours as needed for mild pain or headache.   [name] aspirin 81 MG EC tablet Take 1 tablet (81 mg total) by mouth daily. Swallow whole.  [name] atorvastatin (LIPITOR) 80 MG tablet Take 1 tablet (80 mg total) by mouth daily.  [name] escitalopram (LEXAPRO) 10 MG tablet TAKE 1 TABLET BY MOUTH ONCE DAILY . APPOINTMENT REQUIRED FOR FUTURE REFILLS  . gabapentin (NEURONTIN) 300 MG capsule TAKE 1 CAPSULE BY MOUTH THREE TIMES DAILY  . glimepiride (AMARYL) 4 MG tablet TAKE 2 TABLETS BY MOUTH ONCE DAILY WITH BREAKFAST  . isosorbide mononitrate (IMDUR) 30 MG 24 hr tablet Take 1.5 tablets (45 mg total) by mouth daily. (Patient taking differently: Take 30 mg by mouth daily. 12/20  Advised to increase dosage to 60 mg daily  - Marjie Skiff PA Takes (2)  30 mg  tablets daily)  . metoprolol succinate (TOPROL-XL) 25 MG 24 hr tablet Take 1 tablet (25 mg total) by mouth 2 (two) times daily with a meal.  . Multiple Vitamins-Minerals (MULTIVITAMIN WOMEN PO) Take 1 tablet by mouth 3 (three) times a week.   . prazosin (MINIPRESS) 2 MG capsule Take 1 capsule (2 mg total) by mouth at bedtime.  . Semaglutide, 1 MG/DOSE, (OZEMPIC, 1 MG/DOSE,) 4 MG/3ML SOPN Inject 1 mg into the skin once a week. **PATIENT NEEDS OFFICE VISIT FOR ADDITIONAL REFILLS**  . ticagrelor (BRILINTA) 90 MG TABS tablet Take 1 tablet (90 mg total) by mouth 2 (two) times daily.  . Vitamin D, Ergocalciferol, (DRISDOL) 1.25 MG (50000 UNIT) CAPS capsule Take 1 capsule by mouth once a week  . [DISCONTINUED] JANUVIA 100 MG tablet Take 1 tablet by mouth once daily    Allergies  Allergen Reactions  . Nitroglycerin     Blood pressure dropped, was hospitalized     Patient Active Problem List   Diagnosis Date Noted  . Bilateral shoulder pain 09/08/2020  . Unstable angina (HCC)   . Hospital discharge follow-up 06/10/2020  . Anxiety with depression 06/10/2020  . Dehydration 06/10/2020  . Hyperlipidemia LDL goal <70 05/22/2020  . CAD (coronary artery disease) 05/22/2020  . Ischemic cardiomyopathy 05/22/2020  . NSTEMI (non-ST elevated myocardial infarction) (HCC) 05/20/2020  . Type 2 diabetes mellitus with hyperglycemia, without long-term current use of insulin (HCC) 10/23/2019  . Pain of skin 08/23/2019  . Essential hypertension 10/29/2018  . Tear of left supraspinatus tendon 08/14/2018  . 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  . Dupuytren's contracture of both hands 07/26/2018  . Chronic pain 06/28/2018  . ANA positive 06/28/2018  . Arthritis of left acromioclavicular joint 10/03/2017  . Subacromial bursitis 10/03/2017  . Carpal tunnel syndrome 03/06/2017  . Right hand pain 10/02/2016  . Complex regional pain syndrome 06/16/2016  . Trigger finger of right thumb 03/03/2016  .  Joint synovitis 02/07/2016    Family History  Problem Relation Age of Onset  . High blood pressure Mother   . Diabetes Mother   . Diabetes Maternal Grandmother   . High blood pressure Maternal Grandmother   . Heart attack Maternal Grandfather   . Diabetes Maternal Grandfather   . High blood pressure Maternal Grandfather   . High blood pressure Paternal Grandmother   . High blood pressure Paternal Grandfather   . Diabetes Maternal Aunt   . Diabetes Maternal Uncle   . Skin cancer Maternal Uncle     Social History   Tobacco Use  Smoking Status Never Smoker  Smokeless Tobacco Never Used  Past Surgical History:  Procedure Laterality Date  . CARDIAC CATHETERIZATION    . CESAREAN SECTION  1990  . CORONARY STENT INTERVENTION N/A 05/20/2020   Procedure: CORONARY STENT INTERVENTION;  Surgeon: Kathleene Hazel, MD;  [facility]: [facility];  Service: Cardiovascular;  Laterality: N/A;  . CORONARY STENT INTERVENTION N/A 07/22/2020   Procedure: CORONARY STENT INTERVENTION;  Surgeon: Kathleene Hazel, MD;  Location: [facility];  Service: Cardiovascular;  Laterality: N/A;  . LEFT HEART CATH AND CORONARY ANGIOGRAPHY N/A 05/20/2020   Procedure: LEFT HEART CATH AND CORONARY ANGIOGRAPHY;  Surgeon: Kathleene Hazel, MD;  Location: [facility];  Service: Cardiovascular;  Laterality: N/A;  . TRIGGER FINGER RELEASE Right    thumb    Immunization History  Administered Date(s) Administered  . Pneumococcal Polysaccharide-23 10/02/2016    Recent Results (from the past 2160 hour(s))  Glucose, capillary     Status: Abnormal   Collection Time: 08/30/20  9:10 AM  Result Value Ref Range   Glucose-Capillary 218 (H) 70 - 99 mg/dL    Comment: Glucose reference range applies only to samples taken after fasting for at least 8 hours.  Glucose, capillary     Status: Abnormal   Collection Time: 08/30/20 10:18 AM  Result Value Ref Range   Glucose-Capillary 207 (H)  70 - 99 mg/dL    Comment: Glucose reference range applies only to samples taken after fasting for at least 8 hours.  Glucose, capillary     Status: Abnormal   Collection Time: 09/01/20  9:08 AM  Result Value Ref Range   Glucose-Capillary 193 (H) 70 - 99 mg/dL    Comment: Glucose reference range applies only to samples taken after fasting for at least 8 hours.  Glucose, capillary     Status: Abnormal   Collection Time: 09/01/20  9:59 AM  Result Value Ref Range   Glucose-Capillary 200 (H) 70 - 99 mg/dL    Comment: Glucose reference range applies only to samples taken after fasting for at least 8 hours.  Glucose, capillary     Status: Abnormal   Collection Time: 09/03/20  9:13 AM  Result Value Ref Range   Glucose-Capillary 270 (H) 70 - 99 mg/dL    Comment: Glucose reference range applies only to samples taken after fasting for at least 8 hours.  Glucose, capillary     Status: Abnormal   Collection Time: 09/08/20  9:00 AM  Result Value Ref Range   Glucose-Capillary 201 (H) 70 - 99 mg/dL    Comment: Glucose reference range applies only to samples taken after fasting for at least 8 hours.  Glucose, capillary     Status: Abnormal   Collection Time: 09/10/20  9:06 AM  Result Value Ref Range   Glucose-Capillary 190 (H) 70 - 99 mg/dL    Comment: Glucose reference range applies only to samples taken after fasting for at least 8 hours.    No results found.     All questions at time of visit were answered - patient instructed to contact office with any additional concerns or updates. ER/RTC precautions were reviewed with the patient as applicable.   Please note: manual typing as well as voice recognition software may have been used to produce this document - typos may escape review. Please contact Dr. Lyn Hollingshead for any needed clarifications.

## 2020-11-26 ENCOUNTER — Encounter (INDEPENDENT_AMBULATORY_CARE_PROVIDER_SITE_OTHER): Payer: Self-pay | Admitting: Ophthalmology

## 2020-11-26 ENCOUNTER — Ambulatory Visit (INDEPENDENT_AMBULATORY_CARE_PROVIDER_SITE_OTHER): Payer: BC Managed Care – PPO | Admitting: Ophthalmology

## 2020-11-26 ENCOUNTER — Other Ambulatory Visit: Payer: Self-pay

## 2020-11-26 DIAGNOSIS — I10 Essential (primary) hypertension: Secondary | ICD-10-CM | POA: Diagnosis not present

## 2020-11-26 DIAGNOSIS — E113512 Type 2 diabetes mellitus with proliferative diabetic retinopathy with macular edema, left eye: Secondary | ICD-10-CM

## 2020-11-26 DIAGNOSIS — H3581 Retinal edema: Secondary | ICD-10-CM | POA: Diagnosis not present

## 2020-11-26 DIAGNOSIS — E113311 Type 2 diabetes mellitus with moderate nonproliferative diabetic retinopathy with macular edema, right eye: Secondary | ICD-10-CM

## 2020-11-26 DIAGNOSIS — H35033 Hypertensive retinopathy, bilateral: Secondary | ICD-10-CM

## 2020-11-26 MED ORDER — BEVACIZUMAB CHEMO INJECTION 1.25MG/0.05ML SYRINGE FOR KALEIDOSCOPE
1.2500 mg | INTRAVITREAL | Status: AC | PRN
  Administered 2020-11-26: 1.25 mg via INTRAVITREAL

## 2020-11-29 ENCOUNTER — Other Ambulatory Visit: Payer: Self-pay | Admitting: Medical-Surgical

## 2020-11-29 NOTE — Telephone Encounter (Signed)
Routing to PCP

## 2020-12-09 ENCOUNTER — Ambulatory Visit: Payer: BC Managed Care – PPO | Admitting: Osteopathic Medicine

## 2020-12-13 ENCOUNTER — Other Ambulatory Visit: Payer: Self-pay

## 2020-12-13 ENCOUNTER — Encounter: Payer: Self-pay | Admitting: Osteopathic Medicine

## 2020-12-13 ENCOUNTER — Ambulatory Visit: Payer: BC Managed Care – PPO | Admitting: Osteopathic Medicine

## 2020-12-13 VITALS — BP 126/84 | HR 71 | Temp 98.0°F | Resp 20 | Ht 60.0 in | Wt 178.0 lb

## 2020-12-13 DIAGNOSIS — F515 Nightmare disorder: Secondary | ICD-10-CM

## 2020-12-13 DIAGNOSIS — H6122 Impacted cerumen, left ear: Secondary | ICD-10-CM

## 2020-12-13 DIAGNOSIS — E1165 Type 2 diabetes mellitus with hyperglycemia: Secondary | ICD-10-CM

## 2020-12-13 DIAGNOSIS — E785 Hyperlipidemia, unspecified: Secondary | ICD-10-CM

## 2020-12-13 DIAGNOSIS — I10 Essential (primary) hypertension: Secondary | ICD-10-CM

## 2020-12-13 DIAGNOSIS — F418 Other specified anxiety disorders: Secondary | ICD-10-CM

## 2020-12-13 NOTE — Progress Notes (Signed)
Sarah Kane is a 50 y.o. female who presents to  [facility] at [facility]  today, 12/13/20, seeking care for the following:  [name] Mental health: started Prazosin last visit to help nightmares. Discussion at that point of increasing dose of that vs increasing Lexapro if no better. Today reports not really helping the nightmares . Labs: ordered last visit, drawn this AM, no results back yet  . HTN: BP at goal now  . New concern: ear pressure on L ongoing about a week, nonpainful but feels "stuffed"       ASSESSMENT & PLAN with other pertinent findings:  The primary encounter diagnosis was Nightmares. Diagnoses of Anxiety with depression, Impacted cerumen of left ear, Type 2 diabetes mellitus with hyperglycemia, without long-term current use of insulin (HCC), Hyperlipidemia LDL goal <70, and Essential hypertension were also pertinent to this visit.    Patient Instructions  1. Nightmares 2. Anxiety with depression Increase Prazosin from 2 mg (1 pill) to 4 mg (2 pills) at bedtime  Try this for a week If better, continue at this dose! Call/message and I'll send refills.  If no better.Marland KitchenMarland Kitchen 1) STOP Prazosin and 2)  INCREASE Lexapro form 10 mg (1 pill) to 20 mg (2 pills)   Follow up in 1 month after increase Lexapro dose   3. Impacted cerumen (ear wax) of left ear Removed today, return as needed  4. Type 2 diabetes mellitus with hyperglycemia, without long-term current use of insulin (HCC) 5. Hyperlipidemia LDL goal <70 6. Essential hypertension Waiting on lab results, should have these back soon!      Indication: Cerumen impaction of the ear(s) Medical necessity statement: On physical examination, cerumen impairs clinically significant portions of the external auditory canal, and tympanic membrane. Noted obstructive, copious cerumen that cannot be removed without magnification and instrumentations requiring physician skills Consent:  Discussed benefits and risks of procedure and verbal consent obtained Procedure: Patient was prepped for the procedure. Utilized an otoscope to assess and take note of the ear canal, the tympanic membrane, and the presence, amount, and placement of the cerumen. Gentle water irrigation and soft plastic curette was utilized to remove cerumen.  Post procedure examination: shows cerumen was completely removed. Patient tolerated procedure well. The patient is made aware that they may experience temporary vertigo, temporary hearing loss, and temporary discomfort. If these symptom last for more than 24 hours to call the clinic or proceed to the ED.  No orders of the defined types were placed in this encounter.   No orders of the defined types were placed in this encounter.    See below for relevant physical exam findings  See below for recent lab and imaging results reviewed  Medications, allergies, PMH, PSH, SocH, FamH reviewed below    Follow-up instructions: Return for RECHECK PENDING RESULTS / IF WORSE OR CHANGE, A1C FOLLOWUP IN 3-4 MONTHS .                                        Exam:  BP 126/84 (BP Location: Left Arm, Patient Position: Sitting, Cuff Size: Normal)   Pulse 71   Temp 98 F (36.7 C) (Oral)   Resp 20   Ht 5' (1.524 m)   Wt 178 lb (80.7 kg)   SpO2 100%   BMI 34.76 kg/m   Constitutional: VS see above. General Appearance: alert,  well-developed, well-nourished, NAD  Neck: No masses, trachea midline.   Ears: TM WNL bilaterally post removal cerumen from L   Respiratory: Normal respiratory effort. no wheeze, no rhonchi, no rales  Cardiovascular: S1/S2 normal, no murmur, no rub/gallop auscultated. RRR.   Musculoskeletal: Gait normal. Symmetric and independent movement of all extremiti  Neurological: Normal balance/coordination. No tremor.  Skin: warm, dry, intact.   Psychiatric: Normal judgment/insight. Normal mood and affect.  Oriented x3.   Current Meds  Medication Sig  . acetaminophen (TYLENOL) 500 MG tablet Take 1,000 mg by mouth every 6 (six) hours as needed for mild pain or headache.   [name] aspirin 81 MG EC tablet Take 1 tablet (81 mg total) by mouth daily. Swallow whole.  [name] atorvastatin (LIPITOR) 80 MG tablet Take 1 tablet (80 mg total) by mouth daily.  [name] escitalopram (LEXAPRO) 10 MG tablet TAKE 1 TABLET BY MOUTH ONCE DAILY . APPOINTMENT REQUIRED FOR FUTURE REFILLS  . gabapentin (NEURONTIN) 300 MG capsule TAKE 1 CAPSULE BY MOUTH THREE TIMES DAILY  . glimepiride (AMARYL) 4 MG tablet TAKE 2 TABLETS BY MOUTH ONCE DAILY WITH BREAKFAST  . isosorbide mononitrate (IMDUR) 30 MG 24 hr tablet Take 1.5 tablets (45 mg total) by mouth daily. (Patient taking differently: Take 30 mg by mouth daily. 12/20  Advised to increase dosage to 60 mg daily  - Marjie Skiff PA Takes (2)  30 mg tablets daily)  . metoprolol succinate (TOPROL-XL) 25 MG 24 hr tablet Take 1 tablet (25 mg total) by mouth 2 (two) times daily with a meal.  . Multiple Vitamins-Minerals (MULTIVITAMIN WOMEN PO) Take 1 tablet by mouth 3 (three) times a week.   [name] OZEMPIC, 1 MG/DOSE, 4 MG/3ML SOPN INJECT 1MG  INTO THE SKIN ONCE A WEEK  . prazosin (MINIPRESS) 2 MG capsule Take 1 capsule (2 mg total) by mouth at bedtime.  . sitaGLIPtin (JANUVIA) 100 MG tablet Take 1 tablet (100 mg total) by mouth daily.  . ticagrelor (BRILINTA) 90 MG TABS tablet Take 1 tablet (90 mg total) by mouth 2 (two) times daily.  . Vitamin D, Ergocalciferol, (DRISDOL) 1.25 MG (50000 UNIT) CAPS capsule Take 1 capsule by mouth once a week    Allergies  Allergen Reactions  . Nitroglycerin     Blood pressure dropped, was hospitalized     Patient Active Problem List   Diagnosis Date Noted  . Bilateral shoulder pain 09/08/2020  . Unstable angina (HCC)   . Hospital discharge follow-up 06/10/2020  . Anxiety with depression 06/10/2020  . Dehydration 06/10/2020  . Hyperlipidemia LDL goal <70  05/22/2020  . CAD (coronary artery disease) 05/22/2020  . Ischemic cardiomyopathy 05/22/2020  . NSTEMI (non-ST elevated myocardial infarction) (HCC) 05/20/2020  . Type 2 diabetes mellitus with hyperglycemia, without long-term current use of insulin (HCC) 10/23/2019  . Pain of skin 08/23/2019  . Essential hypertension 10/29/2018  . Tear of left supraspinatus tendon 08/14/2018  . 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  . Dupuytren's contracture of both hands 07/26/2018  . Chronic pain 06/28/2018  . ANA positive 06/28/2018  . Arthritis of left acromioclavicular joint 10/03/2017  . Subacromial bursitis 10/03/2017  . Carpal tunnel syndrome 03/06/2017  . Right hand pain 10/02/2016  . Complex regional pain syndrome 06/16/2016  . Trigger finger of right thumb 03/03/2016  . Joint synovitis 02/07/2016    Family History  Problem Relation Age of Onset  . High blood pressure Mother   . Diabetes Mother   . Diabetes Maternal Grandmother   .  High blood pressure Maternal Grandmother   . Heart attack Maternal Grandfather   . Diabetes Maternal Grandfather   . High blood pressure Maternal Grandfather   . High blood pressure Paternal Grandmother   . High blood pressure Paternal Grandfather   . Diabetes Maternal Aunt   . Diabetes Maternal Uncle   . Skin cancer Maternal Uncle     Social History   Tobacco Use  Smoking Status Never Smoker  Smokeless Tobacco Never Used    Past Surgical History:  Procedure Laterality Date  . CARDIAC CATHETERIZATION    . CESAREAN SECTION  1990  . CORONARY STENT INTERVENTION N/A 05/20/2020   Procedure: CORONARY STENT INTERVENTION;  Surgeon: Kathleene Hazel, MD;  Location: [facility];  Service: Cardiovascular;  Laterality: N/A;  . CORONARY STENT INTERVENTION N/A 07/22/2020   Procedure: CORONARY STENT INTERVENTION;  Surgeon: Kathleene Hazel, MD;  Location: [facility];  Service: Cardiovascular;  Laterality: N/A;  . LEFT  HEART CATH AND CORONARY ANGIOGRAPHY N/A 05/20/2020   Procedure: LEFT HEART CATH AND CORONARY ANGIOGRAPHY;  Surgeon: Kathleene Hazel, MD;  Location: [facility];  Service: Cardiovascular;  Laterality: N/A;  . TRIGGER FINGER RELEASE Right    thumb    Immunization History  Administered Date(s) Administered  . PFIZER(Purple Top)SARS-COV-2 Vaccination 01/06/2020, 01/27/2020  . Pneumococcal Polysaccharide-23 10/02/2016     No results found.     All questions at time of visit were answered - patient instructed to contact office with any additional concerns or updates. ER/RTC precautions were reviewed with the patient as applicable.   Please note: manual typing as well as voice recognition software may have been used to produce this document - typos may escape review. Please contact Dr. Lyn Hollingshead for any needed clarifications.

## 2020-12-13 NOTE — Patient Instructions (Signed)
1. Nightmares 2. Anxiety with depression Increase Prazosin from 2 mg (1 pill) to 4 mg (2 pills) at bedtime  Try this for a week If better, continue at this dose! Call/message and I'll send refills.  If no better.Marland KitchenMarland Kitchen 1) STOP Prazosin and 2)  INCREASE Lexapro form 10 mg (1 pill) to 20 mg (2 pills)   Follow up in 1 month after increase Lexapro dose   3. Impacted cerumen (ear wax) of left ear Removed today, return as needed  4. Type 2 diabetes mellitus with hyperglycemia, without long-term current use of insulin (HCC) 5. Hyperlipidemia LDL goal <70 6. Essential hypertension Waiting on lab results, should have these back soon!

## 2020-12-14 LAB — CBC
HCT: 40 % (ref 35.0–45.0)
Hemoglobin: 13.3 g/dL (ref 11.7–15.5)
MCH: 29.5 pg (ref 27.0–33.0)
MCHC: 33.3 g/dL (ref 32.0–36.0)
MCV: 88.7 fL (ref 80.0–100.0)
MPV: 13.2 fL — ABNORMAL HIGH (ref 7.5–12.5)
Platelets: 184 10*3/uL (ref 140–400)
RBC: 4.51 10*6/uL (ref 3.80–5.10)
RDW: 12.5 % (ref 11.0–15.0)
WBC: 8.4 10*3/uL (ref 3.8–10.8)

## 2020-12-14 LAB — COMPLETE METABOLIC PANEL WITH GFR
AG Ratio: 1.8 (calc) (ref 1.0–2.5)
ALT: 23 U/L (ref 6–29)
AST: 14 U/L (ref 10–35)
Albumin: 4.1 g/dL (ref 3.6–5.1)
Alkaline phosphatase (APISO): 70 U/L (ref 37–153)
BUN: 16 mg/dL (ref 7–25)
CO2: 26 mmol/L (ref 20–32)
Calcium: 9.2 mg/dL (ref 8.6–10.4)
Chloride: 104 mmol/L (ref 98–110)
Creat: 0.78 mg/dL (ref 0.50–1.05)
GFR, Est African American: 102 mL/min/{1.73_m2} (ref >=60)
GFR, Est Non African American: 88 mL/min/{1.73_m2} (ref >=60)
Globulin: 2.3 g/dL (calc) (ref 1.9–3.7)
Glucose, Bld: 213 mg/dL — ABNORMAL HIGH (ref 65–139)
Potassium: 4.2 mmol/L (ref 3.5–5.3)
Sodium: 140 mmol/L (ref 135–146)
Total Bilirubin: 0.4 mg/dL (ref 0.2–1.2)
Total Protein: 6.4 g/dL (ref 6.1–8.1)

## 2020-12-14 LAB — HEMOGLOBIN A1C
Hgb A1c MFr Bld: 7.7 % of total Hgb — ABNORMAL HIGH (ref <5.7)
Mean Plasma Glucose: 174 mg/dL
eAG (mmol/L): 9.7 mmol/L

## 2020-12-14 LAB — LIPID PANEL
Cholesterol: 129 mg/dL (ref <200)
HDL: 57 mg/dL (ref >=50)
LDL Cholesterol (Calc): 45 mg/dL (calc)
Non-HDL Cholesterol (Calc): 72 mg/dL (calc) (ref <130)
Total CHOL/HDL Ratio: 2.3 (calc) (ref <5.0)
Triglycerides: 194 mg/dL — ABNORMAL HIGH (ref <150)

## 2020-12-22 ENCOUNTER — Other Ambulatory Visit: Payer: Self-pay | Admitting: Osteopathic Medicine

## 2020-12-22 DIAGNOSIS — F418 Other specified anxiety disorders: Secondary | ICD-10-CM

## 2020-12-22 NOTE — Progress Notes (Signed)
[facility] Note  12/24/2020     CHIEF COMPLAINT Patient presents for Retina Follow Up   HISTORY OF PRESENT ILLNESS: Sarah Kane is a 50 y.o. female who presents to the clinic today for:   HPI    Retina Follow Up    Patient presents with  Diabetic Retinopathy.  In left eye.  This started weeks ago.  Severity is moderate.  Duration of weeks.  Since onset it is stable.  I, the attending physician,  performed the HPI with the patient and updated documentation appropriately.          Comments    Pt states vision is the same OU.  Pt states OS was red, swollen, and somewhat painful, following last injection due to "jumping" at time of injection.       Last edited by Bernarda Caffey, MD on 12/24/2020  9:32 AM. (History)    pt is   Referring physician: Gevena Cotton MD [place], [place] [id number]  HISTORICAL INFORMATION:   Selected notes from the MEDICAL RECORD NUMBER Referred by Dr. Frederico Hamman for diabetic eye exam LEE: [phone], BCVA OD: 20/50 OS: 20/70 Ocular Hx- DM, cataracts PMH- DM, cholesterol    CURRENT MEDICATIONS: No current outpatient medications on file. (Ophthalmic Drugs)   No current facility-administered medications for this visit. (Ophthalmic Drugs)   Current Outpatient Medications (Other)  Medication Sig  . acetaminophen (TYLENOL) 500 MG tablet Take 1,000 mg by mouth every 6 (six) hours as needed for mild pain or headache.   [name] aspirin 81 MG EC tablet Take 1 tablet (81 mg total) by mouth daily. Swallow whole.  [name] atorvastatin (LIPITOR) 80 MG tablet Take 1 tablet (80 mg total) by mouth daily.  [name] escitalopram (LEXAPRO) 20 MG tablet Take 1 tablet (20 mg total) by mouth daily.  [name] gabapentin (NEURONTIN) 300 MG capsule TAKE 1 CAPSULE BY MOUTH THREE TIMES DAILY  . glimepiride (AMARYL) 4 MG tablet TAKE 2 TABLETS BY MOUTH ONCE DAILY WITH BREAKFAST  . isosorbide mononitrate (IMDUR) 30 MG 24 hr tablet Take 1.5 tablets (45 mg  total) by mouth daily. (Patient taking differently: Take 30 mg by mouth daily. 12/20  Advised to increase dosage to 60 mg daily  - Sande Rives PA Takes (2)  30 mg tablets daily)  . metoprolol succinate (TOPROL-XL) 25 MG 24 hr tablet Take 1 tablet (25 mg total) by mouth 2 (two) times daily with a meal.  . Multiple Vitamins-Minerals (MULTIVITAMIN WOMEN PO) Take 1 tablet by mouth 3 (three) times a week.   [name] OZEMPIC, 1 MG/DOSE, 4 MG/3ML SOPN INJECT 1MG INTO THE SKIN ONCE A WEEK  . prazosin (MINIPRESS) 2 MG capsule Take 1 capsule (2 mg total) by mouth at bedtime.  . sitaGLIPtin (JANUVIA) 100 MG tablet Take 1 tablet (100 mg total) by mouth daily.  . ticagrelor (BRILINTA) 90 MG TABS tablet Take 1 tablet (90 mg total) by mouth 2 (two) times daily.  . Vitamin D, Ergocalciferol, (DRISDOL) 1.25 MG (50000 UNIT) CAPS capsule Take 1 capsule by mouth once a week   No current facility-administered medications for this visit. (Other)      REVIEW OF SYSTEMS: ROS    Positive for: Endocrine, Cardiovascular, Eyes   Negative for: Constitutional, Gastrointestinal, Neurological, Skin, Genitourinary, Musculoskeletal, HENT, Respiratory, Psychiatric, Allergic/Imm, Heme/Lymph   Last edited by Doneen Poisson on 12/24/2020  8:14 AM. (History)       ALLERGIES Allergies  Allergen Reactions  .  Nitroglycerin     Blood pressure dropped, was hospitalized     PAST MEDICAL HISTORY Past Medical History:  Diagnosis Date  . Arthritis   . Diabetes mellitus without complication ([place])   . Hyperlipidemia   . Hypertension    Past Surgical History:  Procedure Laterality Date  . CARDIAC CATHETERIZATION    . CESAREAN SECTION  1990  . CORONARY STENT INTERVENTION N/A 05/20/2020   Procedure: CORONARY STENT INTERVENTION;  Surgeon: Burnell Blanks, MD;  Location: [place] [facility];  Service: Cardiovascular;  Laterality: N/A;  . CORONARY STENT INTERVENTION N/A 07/22/2020   Procedure: CORONARY STENT INTERVENTION;   Surgeon: Burnell Blanks, MD;  Location: [place] [facility];  Service: Cardiovascular;  Laterality: N/A;  . LEFT HEART CATH AND CORONARY ANGIOGRAPHY N/A 05/20/2020   Procedure: LEFT HEART CATH AND CORONARY ANGIOGRAPHY;  Surgeon: Burnell Blanks, MD;  Location: [place] [facility];  Service: Cardiovascular;  Laterality: N/A;  . TRIGGER FINGER RELEASE Right    thumb    FAMILY HISTORY Family History  Problem Relation Age of Onset  . High blood pressure Mother   . Diabetes Mother   . Diabetes Maternal Grandmother   . High blood pressure Maternal Grandmother   . Heart attack Maternal Grandfather   . Diabetes Maternal Grandfather   . High blood pressure Maternal Grandfather   . High blood pressure Paternal Grandmother   . High blood pressure Paternal Grandfather   . Diabetes Maternal Aunt   . Diabetes Maternal Uncle   . Skin cancer Maternal Uncle     SOCIAL HISTORY Social History   Tobacco Use  . Smoking status: Never Smoker  . Smokeless tobacco: Never Used  Vaping Use  . Vaping Use: Never used  Substance Use Topics  . Alcohol use: No  . Drug use: No         OPHTHALMIC EXAM:  Base Eye Exam    Visual Acuity (Snellen - Linear)      Right Left   Dist [place] 20/20 -2 20/40 -2   Dist ph [place]  20/30 -2   Correction: Glasses       Tonometry (Tonopen, 8:20 AM)      Right Left   Pressure 19 13       Pupils      Dark Light Shape React APD   Right 4 3 Round Brisk 0   Left 4 3 Round Brisk 0       Visual Fields      Left Right    Full Full       Extraocular Movement      Right Left    Full Full       Neuro/Psych    Oriented x3: Yes   Mood/Affect: Normal       Dilation    Both eyes: 1.0% Mydriacyl, 2.5% Phenylephrine @ 8:20 AM        Slit Lamp and Fundus Exam    Slit Lamp Exam      Right Left   Lids/Lashes Dermatochalasis - upper lid, Meibomian gland dysfunction, mild Telangiectasia Dermatochalasis - upper lid, Meibomian gland dysfunction, mild  Telangiectasia   Conjunctiva/Sclera White and quiet White and quiet   Cornea Trace inferior Punctate epithelial erosions, tear film debris Trace inferior Punctate epithelial erosions, tear film debris   Anterior Chamber deep, clear, narrow temporal angle deep, clear, narrow temporal angle   Iris Round and dilated, No NVI Round and dilated, No NVI   Lens  Cortical spokes Cortical spokes   Vitreous trace Vitreous syneresis trace Vitreous syneresis       Fundus Exam      Right Left   Disc Pink and Sharp, Compact Pink and Sharp, Compact   C/D Ratio 0.2 0.2   Macula Flat, good foveal reflex, scattered MA/DBH greatest temporal macula, trace cystic changes Blunted foveal reflex, scattered IRH/DBH, +edema temporal macula -- slightly improved   Vessels attenuated, Tortuous early NV nasal to disc - regressing, attenuated, mild tortuousity   Periphery Attached, scattered MA/DBH greatest posteriorly    Attached, scattered MA/DBH greatest posteriorly and nasally, good early 360 PRP changes          IMAGING AND PROCEDURES  Imaging and Procedures for 12/24/2020  OCT, Retina - OU - Both Eyes       Right Eye Quality was good. Central Foveal Thickness: 230. Progression has been stable. Findings include normal foveal contour, intraretinal fluid, no SRF, intraretinal hyper-reflective material (Mild interval improvement in cystic changes temporal macula).   Left Eye Quality was good. Central Foveal Thickness: 218. Progression has improved. Findings include normal foveal contour, intraretinal fluid, no SRF (interval improvement in IRF temporal mac).   Notes *Images captured and stored on drive  Diagnosis / Impression:  DME OU (OS>OD) OD: Mild interval improvement in cystic changes temporal macula OS: interval improvement in IRF temporal mac  Clinical management:  See below  Abbreviations: NFP - Normal foveal profile. CME - cystoid macular edema. PED - pigment epithelial detachment. IRF -  intraretinal fluid. SRF - subretinal fluid. EZ - ellipsoid zone. ERM - epiretinal membrane. ORA - outer retinal atrophy. ORT - outer retinal tubulation. SRHM - subretinal hyper-reflective material. IRHM - intraretinal hyper-reflective material        Intravitreal Injection, Pharmacologic Agent - OS - Left Eye       Time Out 12/24/2020. 8:41 AM. Confirmed correct patient, procedure, site, and patient consented.   Anesthesia Topical anesthesia was used. Anesthetic medications included Lidocaine 2%, Proparacaine 0.5%.   Procedure Preparation included eyelid speculum, 5% betadine to ocular surface. A supplied (32g) needle was used.   Injection:  1.25 mg Bevacizumab (AVASTIN) 1.91m/0.05mL SOLN   NDC: 544818-563-14 Lot: [id number], Expiration date: 02/01/2021   Route: Intravitreal, Site: Left Eye, Waste: 0.05 mL  Post-op Post injection exam found visual acuity of at least counting fingers. The patient tolerated the procedure well. There were no complications. The patient received written and verbal post procedure care education.                 ASSESSMENT/PLAN:    ICD-10-CM   1. Proliferative diabetic retinopathy of left eye with macular edema associated with type 2 diabetes mellitus (HCC)  EH70.2637Intravitreal Injection, Pharmacologic Agent - OS - Left Eye    Bevacizumab (AVASTIN) SOLN 1.25 mg  2. Retinal edema  H35.81 OCT, Retina - OU - Both Eyes  3. Moderate nonproliferative diabetic retinopathy of right eye with macular edema associated with type 2 diabetes mellitus ([place]  E11.3311   4. Essential hypertension  I10   5. Hypertensive retinopathy of both eyes  H35.033     1,2. Proliferative diabetic retinopathy, OS  - s/p IVA OS #1 (02.02.22), #2 (03.04.22)  - s/p PRP OS (02.18.22) - exam shows scattered IRH, macular edema and early NV nasal to disc - BCVA 20/30 OS - FA (02.02.22) shows focal NV nasal periphery OD  - OCT shows OD: Mild interval improvement in cystic changes  temporal macula; OS:  interval improvement in IRF temporal mac - recommend IVA OS #3 today, 04.01.22 for PDR and NVE OS - pt wishes to proceed with injection - RBA of procedure discussed, questions answered - IVA informed consent obtained and signed, 02.02.22 (OS) - see procedure note - f/u 4 weeks -- DFE, OCT, possible injection vs laser  3. Severe Non-proliferative diabetic retinopathy, OD - exam with scattered IRH - BCVA 20/20 OD - FA (02.02.22) shows no NV OS - OCT shows diabetic macular edema, OU (OS>OD) -- OD noncentral  - discussed findings - no intervention recommended at this time for OD  4,5. Hypertensive retinopathy OU - discussed importance of tight BP control - history of MI 04/2020 s/p stents - monitor  Ophthalmic Meds Ordered this visit:  Meds ordered this encounter  Medications  . Bevacizumab (AVASTIN) SOLN 1.25 mg       Return in about 4 weeks (around 01/21/2021) for f/u PDR OS, DFE, OCT.  There are no Patient Instructions on file for this visit.   Explained the diagnoses, plan, and follow up with the patient and they expressed understanding.  Patient expressed understanding of the importance of proper follow up care.   This document serves as a record of services personally performed by Gardiner Sleeper, MD, PhD. It was created on their behalf by San Jetty. Owens Shark, OA an ophthalmic technician. The creation of this record is the provider's dictation and/or activities during the visit.    Electronically signed by: San Jetty. Owens Shark, [place] 03.30.2022 9:41 AM  Gardiner Sleeper, M.D., Ph.D. Diseases & Surgery of the Retina and Vitreous [facility]  I have reviewed the above documentation for accuracy and completeness, and I agree with the above. Gardiner Sleeper, M.D., Ph.D. 12/24/20 9:41 AM   Abbreviations: M myopia (nearsighted); A astigmatism; H hyperopia (farsighted); P presbyopia; Mrx spectacle prescription;  CTL contact lenses; OD right  eye; OS left eye; OU both eyes  XT exotropia; ET esotropia; PEK punctate epithelial keratitis; PEE punctate epithelial erosions; DES dry eye syndrome; MGD meibomian gland dysfunction; ATs artificial tears; PFAT's preservative free artificial tears; [place] nuclear sclerotic cataract; PSC posterior subcapsular cataract; ERM epi-retinal membrane; PVD posterior vitreous detachment; RD retinal detachment; DM diabetes mellitus; DR diabetic retinopathy; NPDR non-proliferative diabetic retinopathy; PDR proliferative diabetic retinopathy; CSME clinically significant macular edema; DME diabetic macular edema; dbh dot blot hemorrhages; CWS cotton wool spot; POAG primary open angle glaucoma; C/D cup-to-disc ratio; HVF humphrey visual field; GVF goldmann visual field; OCT optical coherence tomography; IOP intraocular pressure; BRVO Branch retinal vein occlusion; CRVO central retinal vein occlusion; CRAO central retinal artery occlusion; BRAO branch retinal artery occlusion; RT retinal tear; SB scleral buckle; PPV pars plana vitrectomy; VH Vitreous hemorrhage; PRP panretinal laser photocoagulation; IVK intravitreal kenalog; VMT vitreomacular traction; MH Macular hole;  NVD neovascularization of the disc; NVE neovascularization elsewhere; AREDS age related eye disease study; ARMD age related macular degeneration; POAG primary open angle glaucoma; EBMD epithelial/anterior basement membrane dystrophy; ACIOL anterior chamber intraocular lens; IOL intraocular lens; PCIOL posterior chamber intraocular lens; Phaco/IOL phacoemulsification with intraocular lens placement; [place] photorefractive keratectomy; LASIK laser assisted in situ keratomileusis; HTN hypertension; DM diabetes mellitus; COPD chronic obstructive pulmonary disease

## 2020-12-23 ENCOUNTER — Other Ambulatory Visit: Payer: Self-pay | Admitting: Osteopathic Medicine

## 2020-12-23 DIAGNOSIS — E1165 Type 2 diabetes mellitus with hyperglycemia: Secondary | ICD-10-CM

## 2020-12-23 DIAGNOSIS — F418 Other specified anxiety disorders: Secondary | ICD-10-CM

## 2020-12-23 MED ORDER — ESCITALOPRAM OXALATE 20 MG PO TABS: 20.0000 mg | ORAL_TABLET | Freq: Every day | ORAL | 0 refills | Status: DC

## 2020-12-23 MED ORDER — GLIMEPIRIDE 4 MG PO TABS
ORAL_TABLET | ORAL | 1 refills | Status: DC
Start: 2020-12-23 — End: 2021-05-11

## 2020-12-24 ENCOUNTER — Other Ambulatory Visit: Payer: Self-pay

## 2020-12-24 ENCOUNTER — Ambulatory Visit (INDEPENDENT_AMBULATORY_CARE_PROVIDER_SITE_OTHER): Payer: BC Managed Care – PPO | Admitting: Ophthalmology

## 2020-12-24 ENCOUNTER — Encounter (INDEPENDENT_AMBULATORY_CARE_PROVIDER_SITE_OTHER): Payer: Self-pay | Admitting: Ophthalmology

## 2020-12-24 DIAGNOSIS — E113311 Type 2 diabetes mellitus with moderate nonproliferative diabetic retinopathy with macular edema, right eye: Secondary | ICD-10-CM | POA: Diagnosis not present

## 2020-12-24 DIAGNOSIS — I10 Essential (primary) hypertension: Secondary | ICD-10-CM

## 2020-12-24 DIAGNOSIS — E113512 Type 2 diabetes mellitus with proliferative diabetic retinopathy with macular edema, left eye: Secondary | ICD-10-CM | POA: Diagnosis not present

## 2020-12-24 DIAGNOSIS — F418 Other specified anxiety disorders: Secondary | ICD-10-CM

## 2020-12-24 DIAGNOSIS — H35033 Hypertensive retinopathy, bilateral: Secondary | ICD-10-CM

## 2020-12-24 DIAGNOSIS — H3581 Retinal edema: Secondary | ICD-10-CM | POA: Diagnosis not present

## 2020-12-24 MED ORDER — BEVACIZUMAB CHEMO INJECTION 1.25MG/0.05ML SYRINGE FOR KALEIDOSCOPE
1.2500 mg | INTRAVITREAL | Status: AC | PRN
  Administered 2020-12-24: 1.25 mg via INTRAVITREAL

## 2020-12-24 MED ORDER — ESCITALOPRAM OXALATE 20 MG PO TABS: 20.0000 mg | ORAL_TABLET | Freq: Every day | ORAL | 1 refills | Status: DC

## 2021-01-18 ENCOUNTER — Encounter: Payer: Self-pay | Admitting: Emergency Medicine

## 2021-01-18 ENCOUNTER — Other Ambulatory Visit: Payer: Self-pay

## 2021-01-18 ENCOUNTER — Emergency Department (INDEPENDENT_AMBULATORY_CARE_PROVIDER_SITE_OTHER): Payer: BC Managed Care – PPO

## 2021-01-18 ENCOUNTER — Emergency Department
Admission: EM | Admit: 2021-01-18 | Discharge: 2021-01-18 | Disposition: A | Discharge status: Home / Self Care | Payer: BC Managed Care – PPO | Source: Home / Self Care

## 2021-01-18 DIAGNOSIS — U071 COVID-19: Secondary | ICD-10-CM

## 2021-01-18 DIAGNOSIS — E1169 Type 2 diabetes mellitus with other specified complication: Secondary | ICD-10-CM

## 2021-01-18 DIAGNOSIS — R0602 Shortness of breath: Secondary | ICD-10-CM

## 2021-01-18 DIAGNOSIS — R059 Cough, unspecified: Secondary | ICD-10-CM

## 2021-01-18 DIAGNOSIS — Z794 Long term (current) use of insulin: Secondary | ICD-10-CM

## 2021-01-18 DIAGNOSIS — I10 Essential (primary) hypertension: Secondary | ICD-10-CM

## 2021-01-18 DIAGNOSIS — I252 Old myocardial infarction: Secondary | ICD-10-CM

## 2021-01-18 LAB — POC SARS CORONAVIRUS 2 AG -  ED: SARS Coronavirus 2 Ag: POSITIVE — AB

## 2021-01-18 MED ORDER — BENZONATATE 100 MG PO CAPS: 100.0000 mg | ORAL_CAPSULE | Freq: Three times a day (TID) | ORAL | 0 refills | Status: DC

## 2021-01-18 MED ORDER — METHYLPREDNISOLONE SODIUM SUCC 125 MG IJ SOLR
125.0000 mg | Freq: Once | INTRAMUSCULAR | Status: AC
  Administered 2021-01-18: 125 mg via INTRAMUSCULAR

## 2021-01-18 NOTE — Progress Notes (Shared)
[facility] Note  01/21/2021     CHIEF COMPLAINT Patient presents for No chief complaint on file.   HISTORY OF PRESENT ILLNESS: Sarah Kane is a 50 y.o. female who presents to the clinic today for:   pt is   Referring physician: Gevena Cotton MD [place], [place] [address]  HISTORICAL INFORMATION:   Selected notes from the MEDICAL RECORD NUMBER Referred by Dr. Frederico Hamman for diabetic eye exam LEE: [phone], BCVA OD: 20/50 OS: 20/70 Ocular Hx- DM, cataracts PMH- DM, cholesterol    CURRENT MEDICATIONS: No current outpatient medications on file. (Ophthalmic Drugs)   No current facility-administered medications for this visit. (Ophthalmic Drugs)   Current Outpatient Medications (Other)  Medication Sig  . acetaminophen (TYLENOL) 500 MG tablet Take 1,000 mg by mouth every 6 (six) hours as needed for mild pain or headache.   [name] aspirin 81 MG EC tablet Take 1 tablet (81 mg total) by mouth daily. Swallow whole.  [name] atorvastatin (LIPITOR) 80 MG tablet Take 1 tablet (80 mg total) by mouth daily.  [name] escitalopram (LEXAPRO) 20 MG tablet Take 1 tablet (20 mg total) by mouth daily.  [name] gabapentin (NEURONTIN) 300 MG capsule TAKE 1 CAPSULE BY MOUTH THREE TIMES DAILY  . glimepiride (AMARYL) 4 MG tablet TAKE 2 TABLETS BY MOUTH ONCE DAILY WITH BREAKFAST  . isosorbide mononitrate (IMDUR) 30 MG 24 hr tablet Take 1.5 tablets (45 mg total) by mouth daily. (Patient taking differently: Take 30 mg by mouth daily. 12/20  Advised to increase dosage to 60 mg daily  - Sande Rives PA Takes (2)  30 mg tablets daily)  . metoprolol succinate (TOPROL-XL) 25 MG 24 hr tablet Take 1 tablet (25 mg total) by mouth 2 (two) times daily with a meal.  . Multiple Vitamins-Minerals (MULTIVITAMIN WOMEN PO) Take 1 tablet by mouth 3 (three) times a week.   [name] OZEMPIC, 1 MG/DOSE, 4 MG/3ML SOPN INJECT 1MG INTO THE SKIN ONCE A WEEK  . prazosin (MINIPRESS) 2 MG capsule Take 1  capsule (2 mg total) by mouth at bedtime.  . sitaGLIPtin (JANUVIA) 100 MG tablet Take 1 tablet (100 mg total) by mouth daily.  . ticagrelor (BRILINTA) 90 MG TABS tablet Take 1 tablet (90 mg total) by mouth 2 (two) times daily.  . Vitamin D, Ergocalciferol, (DRISDOL) 1.25 MG (50000 UNIT) CAPS capsule Take 1 capsule by mouth once a week   No current facility-administered medications for this visit. (Other)      REVIEW OF SYSTEMS:    ALLERGIES Allergies  Allergen Reactions  . Nitroglycerin     Blood pressure dropped, was hospitalized     PAST MEDICAL HISTORY Past Medical History:  Diagnosis Date  . Arthritis   . Diabetes mellitus without complication ([place])   . Hyperlipidemia   . Hypertension    Past Surgical History:  Procedure Laterality Date  . CARDIAC CATHETERIZATION    . CESAREAN SECTION  1990  . CORONARY STENT INTERVENTION N/A 05/20/2020   Procedure: CORONARY STENT INTERVENTION;  Surgeon: Burnell Blanks, MD;  Location: [place] [facility];  Service: Cardiovascular;  Laterality: N/A;  . CORONARY STENT INTERVENTION N/A 07/22/2020   Procedure: CORONARY STENT INTERVENTION;  Surgeon: Burnell Blanks, MD;  Location: [place] [facility];  Service: Cardiovascular;  Laterality: N/A;  . LEFT HEART CATH AND CORONARY ANGIOGRAPHY N/A 05/20/2020   Procedure: LEFT HEART CATH AND CORONARY ANGIOGRAPHY;  Surgeon: Burnell Blanks, MD;  Location:  [place] [facility];  Service: Cardiovascular;  Laterality: N/A;  . TRIGGER FINGER RELEASE Right    thumb    FAMILY HISTORY Family History  Problem Relation Age of Onset  . High blood pressure Mother   . Diabetes Mother   . Diabetes Maternal Grandmother   . High blood pressure Maternal Grandmother   . Heart attack Maternal Grandfather   . Diabetes Maternal Grandfather   . High blood pressure Maternal Grandfather   . High blood pressure Paternal Grandmother   . High blood pressure Paternal Grandfather   . Diabetes  Maternal Aunt   . Diabetes Maternal Uncle   . Skin cancer Maternal Uncle     SOCIAL HISTORY Social History   Tobacco Use  . Smoking status: Never Smoker  . Smokeless tobacco: Never Used  Vaping Use  . Vaping Use: Never used  Substance Use Topics  . Alcohol use: No  . Drug use: No         OPHTHALMIC EXAM:  Not recorded     IMAGING AND PROCEDURES  Imaging and Procedures for 01/21/2021           ASSESSMENT/PLAN:    ICD-10-CM   1. Proliferative diabetic retinopathy of left eye with macular edema associated with type 2 diabetes mellitus ([place])  X41.2878   2. Retinal edema  H35.81   3. Moderate nonproliferative diabetic retinopathy of right eye with macular edema associated with type 2 diabetes mellitus ([place])  E11.3311   4. Essential hypertension  I10   5. Hypertensive retinopathy of both eyes  H35.033     1,2. Proliferative diabetic retinopathy, OS  - s/p IVA OS #1 (02.02.22), #2 (03.04.22), #3 (04.01.22)  - s/p PRP OS (02.18.22) - exam shows scattered IRH, macular edema and early NV nasal to disc - BCVA 20/30 OS - FA (02.02.22) shows focal NV nasal periphery OD  - OCT shows OD: Mild interval improvement in cystic changes temporal macula; OS: interval improvement in IRF temporal mac - recommend IVA OS #4 today, 04.29.22 for PDR and NVE OS - pt wishes to proceed with injection - RBA of procedure discussed, questions answered - IVA informed consent obtained and signed, 02.02.22 (OS) - see procedure note - f/u 4 weeks -- DFE, OCT, possible injection vs laser  3. Severe Non-proliferative diabetic retinopathy, OD - exam with scattered IRH - BCVA 20/20 OD - FA (02.02.22) shows no NV OS - OCT shows diabetic macular edema, OU (OS>OD) -- OD noncentral  - discussed findings - no intervention recommended at this time for OD  4,5. Hypertensive retinopathy OU - discussed importance of tight BP control - history of MI 04/2020 s/p stents - monitor  Ophthalmic Meds  Ordered this visit:  No orders of the defined types were placed in this encounter.      No follow-ups on file.  There are no Patient Instructions on file for this visit.   Explained the diagnoses, plan, and follow up with the patient and they expressed understanding.  Patient expressed understanding of the importance of proper follow up care.   This document serves as a record of services personally performed by Gardiner Sleeper, MD, PhD. It was created on their behalf by San Jetty. Owens Shark, OA an ophthalmic technician. The creation of this record is the provider's dictation and/or activities during the visit.    Electronically signed by: San Jetty. Owens Shark, [place] 04.26.2022 7:55 AM  Gardiner Sleeper, M.D., Ph.D. Diseases & Surgery of the Retina and Vitreous  [facility]    Abbreviations: M myopia (nearsighted); A astigmatism; H hyperopia (farsighted); P presbyopia; Mrx spectacle prescription;  CTL contact lenses; OD right eye; OS left eye; OU both eyes  XT exotropia; ET esotropia; PEK punctate epithelial keratitis; PEE punctate epithelial erosions; DES dry eye syndrome; MGD meibomian gland dysfunction; ATs artificial tears; PFAT's preservative free artificial tears; [place] nuclear sclerotic cataract; PSC posterior subcapsular cataract; ERM epi-retinal membrane; PVD posterior vitreous detachment; RD retinal detachment; DM diabetes mellitus; DR diabetic retinopathy; NPDR non-proliferative diabetic retinopathy; PDR proliferative diabetic retinopathy; CSME clinically significant macular edema; DME diabetic macular edema; dbh dot blot hemorrhages; CWS cotton wool spot; POAG primary open angle glaucoma; C/D cup-to-disc ratio; HVF humphrey visual field; GVF goldmann visual field; OCT optical coherence tomography; IOP intraocular pressure; BRVO Branch retinal vein occlusion; CRVO central retinal vein occlusion; CRAO central retinal artery occlusion; BRAO branch retinal artery occlusion; RT  retinal tear; SB scleral buckle; PPV pars plana vitrectomy; VH Vitreous hemorrhage; PRP panretinal laser photocoagulation; IVK intravitreal kenalog; VMT vitreomacular traction; MH Macular hole;  NVD neovascularization of the disc; NVE neovascularization elsewhere; AREDS age related eye disease study; ARMD age related macular degeneration; POAG primary open angle glaucoma; EBMD epithelial/anterior basement membrane dystrophy; ACIOL anterior chamber intraocular lens; IOL intraocular lens; PCIOL posterior chamber intraocular lens; Phaco/IOL phacoemulsification with intraocular lens placement; [place] photorefractive keratectomy; LASIK laser assisted in situ keratomileusis; HTN hypertension; DM diabetes mellitus; COPD chronic obstructive pulmonary disease

## 2021-01-18 NOTE — ED Triage Notes (Signed)
Cough & congestion since Friday  HTN - does not have a BP cuff at home Feels like she has had a fever- no thermometer at home  Positive home COVID test this morning Pt denies receiving COVID vaccine ( recorded in My Chart) Also may have had a flu exposure

## 2021-01-18 NOTE — Discharge Instructions (Addendum)
Referral placed for outpatient Covid treatment  Rapid Covid test was positive today  Chest xray today shows no pneumonia  I have sent in tessalon perles for you to use one capsule every 8 hours as needed for cough.  You need to quarantine at home and away from everyone for 10 days from today.  You may go back to work on 01/29/21 if your symptoms are gone

## 2021-01-18 NOTE — ED Provider Notes (Signed)
[place]   [id number] 01/18/21 Arrival Time: 7591   CC: COVID symptoms  SUBJECTIVE: History from: patient.  Sarah Kane is a 50 y.o. female who presents with cough, chills, congestion, fatigue, muscle aches. Denies sick exposure to COVID, flu or strep. Denies recent travel. Has positive history of Covid. Has completed Covid vaccines. Has not completed flu vaccine. Has not taken OTC medications for this. Fatigue and cough are made worse with activity. Denies previous symptoms in the past. Denies fever, sinus pain, rhinorrhea, sore throat, nausea, changes in bowel or bladder habits.    ROS: As per HPI.  All other pertinent ROS negative.     Past Medical History:  Diagnosis Date  . Arthritis   . COVID-19 05/2020  . Diabetes mellitus without complication ([place])   . Hyperlipidemia   . Hypertension    Past Surgical History:  Procedure Laterality Date  . CARDIAC CATHETERIZATION    . CESAREAN SECTION  1990  . CORONARY STENT INTERVENTION N/A 05/20/2020   Procedure: CORONARY STENT INTERVENTION;  Surgeon: Burnell Blanks, MD;  Location: [facility];  Service: Cardiovascular;  Laterality: N/A;  . CORONARY STENT INTERVENTION N/A 07/22/2020   Procedure: CORONARY STENT INTERVENTION;  Surgeon: Burnell Blanks, MD;  Location: [place] [facility];  Service: Cardiovascular;  Laterality: N/A;  . LEFT HEART CATH AND CORONARY ANGIOGRAPHY N/A 05/20/2020   Procedure: LEFT HEART CATH AND CORONARY ANGIOGRAPHY;  Surgeon: Burnell Blanks, MD;  Location: [facility];  Service: Cardiovascular;  Laterality: N/A;  . TRIGGER FINGER RELEASE Right    thumb   Allergies  Allergen Reactions  . Nitroglycerin     Blood pressure dropped, was hospitalized    No current facility-administered medications on file prior to encounter.   Current Outpatient Medications on File Prior to Encounter  Medication Sig Dispense Refill  . aspirin 81 MG EC tablet Take 1 tablet (81  mg total) by mouth daily. Swallow whole. 30 tablet 11  . atorvastatin (LIPITOR) 80 MG tablet Take 1 tablet (80 mg total) by mouth daily. 90 tablet 3  . escitalopram (LEXAPRO) 20 MG tablet Take 1 tablet (20 mg total) by mouth daily. 90 tablet 1  . gabapentin (NEURONTIN) 300 MG capsule TAKE 1 CAPSULE BY MOUTH THREE TIMES DAILY 270 capsule 3  . glimepiride (AMARYL) 4 MG tablet TAKE 2 TABLETS BY MOUTH ONCE DAILY WITH BREAKFAST 180 tablet 1  . isosorbide mononitrate (IMDUR) 30 MG 24 hr tablet Take 1.5 tablets (45 mg total) by mouth daily. (Patient taking differently: Take 30 mg by mouth daily. 12/20  Advised to increase dosage to 60 mg daily  - Callie Goodrich PA Takes (2)  30 mg tablets daily) 135 tablet 3  . metoprolol succinate (TOPROL-XL) 25 MG 24 hr tablet Take 1 tablet (25 mg total) by mouth 2 (two) times daily with a meal. 180 tablet 3  . Multiple Vitamins-Minerals (MULTIVITAMIN WOMEN PO) Take 1 tablet by mouth 3 (three) times a week.     . sitaGLIPtin (JANUVIA) 100 MG tablet Take 1 tablet (100 mg total) by mouth daily. 90 tablet 3  . ticagrelor (BRILINTA) 90 MG TABS tablet Take 1 tablet (90 mg total) by mouth 2 (two) times daily. 60 tablet 11  . Vitamin D, Ergocalciferol, (DRISDOL) 1.25 MG (50000 UNIT) CAPS capsule Take 1 capsule by mouth once a week 12 capsule 3  . acetaminophen (TYLENOL) 500 MG tablet Take 1,000 mg by mouth every 6 (six) hours as  needed for mild pain or headache.     [name] OZEMPIC, 1 MG/DOSE, 4 MG/3ML SOPN INJECT 1MG INTO THE SKIN ONCE A WEEK 15 mL 2  . prazosin (MINIPRESS) 2 MG capsule Take 1 capsule (2 mg total) by mouth at bedtime. (Patient not taking: Reported on 01/18/2021) 90 capsule 0   Social History   Socioeconomic History  . Marital status: Divorced    Spouse name: Not on file  . Number of children: 3  . Years of education: Not on file  . Highest education level: Not on file  Occupational History    Employer: [employer]  Tobacco Use  .  Smoking status: Never Smoker  . Smokeless tobacco: Never Used  Vaping Use  . Vaping Use: Never used  Substance and Sexual Activity  . Alcohol use: No  . Drug use: No  . Sexual activity: Not Currently    Partners: Male  Other Topics Concern  . Not on file  Social History Narrative  . Not on file   Social Determinants of Health   Financial Resource Strain: Not on file  Food Insecurity: Not on file  Transportation Needs: Not on file  Physical Activity: Not on file  Stress: Not on file  Social Connections: Not on file  Intimate Partner Violence: Not on file   Family History  Problem Relation Age of Onset  . High blood pressure Mother   . Diabetes Mother   . Diabetes Maternal Grandmother   . High blood pressure Maternal Grandmother   . Heart attack Maternal Grandfather   . Diabetes Maternal Grandfather   . High blood pressure Maternal Grandfather   . High blood pressure Paternal Grandmother   . High blood pressure Paternal Grandfather   . Diabetes Maternal Aunt   . Diabetes Maternal Uncle   . Skin cancer Maternal Uncle    OBJECTIVE:  Vitals:   01/18/21 1357 01/18/21 1359  BP: 121/84   Pulse: 90   Resp: 16   Temp: 98.4 F (36.9 C)   TempSrc: Oral   SpO2: 98%   Weight:  180 lb (81.6 kg)  Height:  5' (1.524 m)   General appearance: alert; appears fatigued, but nontoxic; speaking in full sentences and tolerating own secretions HEENT: NCAT; Ears: EACs clear, TMs pearly gray; Eyes: PERRL.  EOM grossly intact. Sinuses: nontender; Nose: nares patent with clear rhinorrhea, Throat: oropharynx erythematous, cobblestoning present, tonsils non erythematous or enlarged, uvula midline  Neck: supple without LAD Lungs: unlabored respirations, symmetrical air entry; cough: moderate; no respiratory distress; wheezing, coarse lung sounds noted bilaterally Heart: regular rate and rhythm.  Radial pulses 2+ symmetrical bilaterally Skin: warm and dry Psychological: alert and  cooperative; normal mood and affect  LABS:  Results for orders placed or performed during the hospital encounter of 01/18/21 (from the past 24 hour(s))  POC SARS Coronavirus 2 Ag-ED - Nasal Swab (BD Veritor Kit)     Status: Abnormal   Collection Time: 01/18/21  1:53 PM  Result Value Ref Range   SARS Coronavirus 2 Ag Positive (A) Negative   ASSESSMENT & PLAN:  1. COVID-19   2. Hx of acute myocardial infarction   3. Hypertension, unspecified type   4. Type 2 diabetes mellitus with other specified complication, with long-term current use of insulin ([place])   5. Cough   6. SOB (shortness of breath)    Chest xray today negative for pneumonia or abnormality Rapid covid positive in office today Continue supportive care at  home Get plenty of rest and push fluids Use OTC zyrtec for nasal congestion, runny nose, and/or sore throat Use OTC flonase for nasal congestion and runny nose Use medications daily for symptom relief Use OTC medications like ibuprofen or tylenol as needed fever or pain Call or go to the ED if you have any new or worsening symptoms such as fever, worsening cough, shortness of breath, chest tightness, chest pain, turning blue, changes in mental status.  Reviewed expectations re: course of current medical issues. Questions answered. Outlined signs and symptoms indicating need for more acute intervention. Patient verbalized understanding. After Visit Summary given.         Faustino Congress, NP 01/18/21 1545

## 2021-01-19 ENCOUNTER — Telehealth (HOSPITAL_COMMUNITY): Payer: Self-pay

## 2021-01-19 NOTE — Telephone Encounter (Signed)
Called to discuss with patient about COVID-19 symptoms and the use of one of the available treatments for those with mild to moderate Covid symptoms and at a high risk of hospitalization.  Pt may appear to qualify for outpatient treatment due to co-morbid conditions and/or a member of an at-risk group in accordance with the FDA Emergency Use Authorization.    Symptom onset: Thursday 01/13/21  Unfortunately, pt does not qualify for treatment due to symptom onset >7 days.  Essie Hart, RN

## 2021-01-21 ENCOUNTER — Encounter (INDEPENDENT_AMBULATORY_CARE_PROVIDER_SITE_OTHER): Payer: BC Managed Care – PPO | Admitting: Ophthalmology

## 2021-01-21 DIAGNOSIS — H35033 Hypertensive retinopathy, bilateral: Secondary | ICD-10-CM

## 2021-01-21 DIAGNOSIS — E113512 Type 2 diabetes mellitus with proliferative diabetic retinopathy with macular edema, left eye: Secondary | ICD-10-CM

## 2021-01-21 DIAGNOSIS — E113311 Type 2 diabetes mellitus with moderate nonproliferative diabetic retinopathy with macular edema, right eye: Secondary | ICD-10-CM

## 2021-01-21 DIAGNOSIS — H3581 Retinal edema: Secondary | ICD-10-CM

## 2021-01-21 DIAGNOSIS — I10 Essential (primary) hypertension: Secondary | ICD-10-CM

## 2021-01-31 NOTE — Progress Notes (Signed)
[facility] Note  02/03/2021     CHIEF COMPLAINT Patient presents for Retina Follow Up   HISTORY OF PRESENT ILLNESS: Sarah Kane is a 50 y.o. female who presents to the clinic today for:   HPI    Retina Follow Up    Patient presents with  Diabetic Retinopathy.  In left eye.  This started 6 weeks ago.  Since onset it is stable.  I, the attending physician,  performed the HPI with the patient and updated documentation appropriately.          Comments    Pt here for 6wk exu retinal follow up PDR OS, IVA OS VS laser. Pt states vision is about the same, no changes she can tell. No ocular pain or discomfort. Blood sugar was 140 the other day, most recent A1C level was around 7/8.        Last edited by Bernarda Caffey, MD on 02/03/2021 12:12 PM. (History)    Patient is questioning the best way to improve BS levels and A1C.   Dr. Coralyn Pear discussed/ recommends diet and exercise. Also, the damage that has been done prior will be there.  The swelling can be treated now with laser to help reduce the amount of shots in the future.   Referring physician: Gevena Cotton MD [place], [place] [address]  HISTORICAL INFORMATION:   Selected notes from the MEDICAL RECORD NUMBER Referred by Dr. Frederico Hamman for diabetic eye exam LEE: [phone], BCVA OD: 20/50 OS: 20/70 Ocular Hx- DM, cataracts PMH- DM, cholesterol    CURRENT MEDICATIONS: No current outpatient medications on file. (Ophthalmic Drugs)   No current facility-administered medications for this visit. (Ophthalmic Drugs)   Current Outpatient Medications (Other)  Medication Sig  . acetaminophen (TYLENOL) 500 MG tablet Take 1,000 mg by mouth every 6 (six) hours as needed for mild pain or headache.   [name] aspirin 81 MG EC tablet Take 1 tablet (81 mg total) by mouth daily. Swallow whole.  [name] atorvastatin (LIPITOR) 80 MG tablet Take 1 tablet (80 mg total) by mouth daily.  . benzonatate (TESSALON) 100 MG  capsule Take 1 capsule (100 mg total) by mouth every 8 (eight) hours.  [name] escitalopram (LEXAPRO) 20 MG tablet Take 1 tablet (20 mg total) by mouth daily.  [name] gabapentin (NEURONTIN) 300 MG capsule TAKE 1 CAPSULE BY MOUTH THREE TIMES DAILY  . glimepiride (AMARYL) 4 MG tablet TAKE 2 TABLETS BY MOUTH ONCE DAILY WITH BREAKFAST  . isosorbide mononitrate (IMDUR) 30 MG 24 hr tablet Take 1.5 tablets (45 mg total) by mouth daily. (Patient taking differently: Take 30 mg by mouth daily. 12/20  Advised to increase dosage to 60 mg daily  - Sande Rives PA Takes (2)  30 mg tablets daily)  . metoprolol succinate (TOPROL-XL) 25 MG 24 hr tablet Take 1 tablet (25 mg total) by mouth 2 (two) times daily with a meal.  . Multiple Vitamins-Minerals (MULTIVITAMIN WOMEN PO) Take 1 tablet by mouth 3 (three) times a week.   [name] OZEMPIC, 1 MG/DOSE, 4 MG/3ML SOPN INJECT 1MG INTO THE SKIN ONCE A WEEK  . prazosin (MINIPRESS) 2 MG capsule Take 1 capsule (2 mg total) by mouth at bedtime. (Patient not taking: Reported on 01/18/2021)  . sitaGLIPtin (JANUVIA) 100 MG tablet Take 1 tablet (100 mg total) by mouth daily.  . ticagrelor (BRILINTA) 90 MG TABS tablet Take 1 tablet (90 mg total) by mouth 2 (two) times daily.  [name]  Vitamin D, Ergocalciferol, (DRISDOL) 1.25 MG (50000 UNIT) CAPS capsule Take 1 capsule by mouth once a week   No current facility-administered medications for this visit. (Other)      REVIEW OF SYSTEMS: ROS    Positive for: Endocrine, Cardiovascular, Eyes   Negative for: Constitutional, Gastrointestinal, Neurological, Skin, Genitourinary, Musculoskeletal, HENT, Respiratory, Psychiatric, Allergic/Imm, Heme/Lymph   Last edited by Kingsley Spittle, COT on 02/03/2021  9:33 AM. (History)       ALLERGIES Allergies  Allergen Reactions  . Nitroglycerin     Blood pressure dropped, was hospitalized     PAST MEDICAL HISTORY Past Medical History:  Diagnosis Date  . Arthritis   . COVID-19 05/2020  . Diabetes  mellitus without complication ([place])   . Hyperlipidemia   . Hypertension    Past Surgical History:  Procedure Laterality Date  . CARDIAC CATHETERIZATION    . CESAREAN SECTION  1990  . CORONARY STENT INTERVENTION N/A 05/20/2020   Procedure: CORONARY STENT INTERVENTION;  Surgeon: Burnell Blanks, MD;  Location: [facility];  Service: Cardiovascular;  Laterality: N/A;  . CORONARY STENT INTERVENTION N/A 07/22/2020   Procedure: CORONARY STENT INTERVENTION;  Surgeon: Burnell Blanks, MD;  Location: [facility];  Service: Cardiovascular;  Laterality: N/A;  . LEFT HEART CATH AND CORONARY ANGIOGRAPHY N/A 05/20/2020   Procedure: LEFT HEART CATH AND CORONARY ANGIOGRAPHY;  Surgeon: Burnell Blanks, MD;  Location: [facility];  Service: Cardiovascular;  Laterality: N/A;  . TRIGGER FINGER RELEASE Right    thumb    FAMILY HISTORY Family History  Problem Relation Age of Onset  . High blood pressure Mother   . Diabetes Mother   . Diabetes Maternal Grandmother   . High blood pressure Maternal Grandmother   . Heart attack Maternal Grandfather   . Diabetes Maternal Grandfather   . High blood pressure Maternal Grandfather   . High blood pressure Paternal Grandmother   . High blood pressure Paternal Grandfather   . Diabetes Maternal Aunt   . Diabetes Maternal Uncle   . Skin cancer Maternal Uncle     SOCIAL HISTORY Social History   Tobacco Use  . Smoking status: Never Smoker  . Smokeless tobacco: Never Used  Vaping Use  . Vaping Use: Never used  Substance Use Topics  . Alcohol use: No  . Drug use: No         OPHTHALMIC EXAM:  Base Eye Exam    Visual Acuity (Snellen - Linear)      Right Left   Dist [place] 20/20 20/25   Dist ph [place]  20/20 -2       Tonometry (Tonopen, 9:47 AM)      Right Left   Pressure 20 21       Pupils      Dark Light Shape React APD   Right 4 3 Round Brisk None   Left 4 3 Round Brisk None       Visual Fields  (Counting fingers)      Left Right    Full Full       Extraocular Movement      Right Left    Full, Ortho Full, Ortho       Neuro/Psych    Oriented x3: Yes   Mood/Affect: Normal       Dilation    Both eyes: 1.0% Mydriacyl, 2.5% Phenylephrine @ 9:48 AM        Slit Lamp and Fundus Exam  Slit Lamp Exam      Right Left   Lids/Lashes Dermatochalasis - upper lid, Meibomian gland dysfunction, mild Telangiectasia Dermatochalasis - upper lid, Meibomian gland dysfunction, mild Telangiectasia   Conjunctiva/Sclera White and quiet White and quiet   Cornea Trace inferior Punctate epithelial erosions, tear film debris Trace inferior Punctate epithelial erosions, tear film debris   Anterior Chamber deep, clear, narrow temporal angle deep, clear, narrow temporal angle   Iris Round and dilated, No NVI Round and dilated, No NVI   Lens Cortical spokes Cortical spokes   Vitreous trace Vitreous syneresis trace Vitreous syneresis       Fundus Exam      Right Left   Disc Pink and Sharp, Compact Pink and Sharp, Compact   C/D Ratio 0.2 0.2   Macula Flat, good foveal reflex, scattered MA/DBH greatest temporal macula, trace cystic changes -- good targets for focal laser Blunted foveal reflex, scattered IRH/DBH, +edema temporal macula -- slightly improved   Vessels attenuated, mild Tortuocity  early NV nasal to disc - regressing, attenuated, mild tortuousity   Periphery Attached, scattered MA/DBH greatest posteriorly    Attached, scattered MA/DBH greatest posteriorly and nasally, good early 360 PRP changes          IMAGING AND PROCEDURES  Imaging and Procedures for 02/03/2021  OCT, Retina - OU - Both Eyes       Right Eye Quality was good. Central Foveal Thickness: 228. Progression has been stable. Findings include normal foveal contour, intraretinal fluid, no SRF, intraretinal hyper-reflective material (Persistent IRF temporal periphery ).   Left Eye Quality was good. Central Foveal  Thickness: 221. Progression has been stable. Findings include normal foveal contour, intraretinal fluid, no SRF (interval improvement in IRF temporal mac).   Notes *Images captured and stored on drive  Diagnosis / Impression:  DME OU (OS>OD) OD: Persistent IRF temporal periphery  OS: interval improvement in IRF temporal mac  Clinical management:  See below  Abbreviations: NFP - Normal foveal profile. CME - cystoid macular edema. PED - pigment epithelial detachment. IRF - intraretinal fluid. SRF - subretinal fluid. EZ - ellipsoid zone. ERM - epiretinal membrane. ORA - outer retinal atrophy. ORT - outer retinal tubulation. SRHM - subretinal hyper-reflective material. IRHM - intraretinal hyper-reflective material        Intravitreal Injection, Pharmacologic Agent - OS - Left Eye       Time Out 02/03/2021. 10:39 AM. Confirmed correct patient, procedure, site, and patient consented.   Anesthesia Topical anesthesia was used. Anesthetic medications included Lidocaine 2%, Proparacaine 0.5%.   Procedure Preparation included 5% betadine to ocular surface, eyelid speculum. A supplied needle was used.   Injection:  1.25 mg Bevacizumab (AVASTIN) 1.67m/0.05mL SOLN   NDC: 531497-026-37 Lot:: [id number] Expiration date: 03/05/2021   Route: Intravitreal, Site: Left Eye, Waste: 0 mL  Post-op Post injection exam found visual acuity of at least counting fingers. The patient tolerated the procedure well. There were no complications. The patient received written and verbal post procedure care education. Post injection medications were not given.                 ASSESSMENT/PLAN:    ICD-10-CM   1. 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 Retinal edema  H35.81 OCT, Retina - OU - Both Eyes  3. 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 Essential hypertension  I10   5. Hypertensive retinopathy of both eyes  H35.033     1,2. Proliferative diabetic retinopathy w/ DME, OS  - s/p IVA OS #1 (02.02.22), #2 (03.04.22), #3 (04.01.22)  - s/p PRP OS (02.18.22) - BCVA 20/20 OS from 20/30 - FA (02.02.22) shows focal NV nasal periphery OD  - OCT shows OD: Persistent IRF temporal periphery,   OS: interval improvement in IRF temporal mac  - exam shows good focal laser targets OD - recommend IVA OS #4 today, 05.12.22 for DME - pt wishes to proceed with injection - RBA of procedure discussed, questions answered - IVA informed consent obtained and signed, 02.02.22 (OS) - see procedure note - f/u 2-3 weeks -- focal laser OD  3. Severe Non-proliferative diabetic retinopathy, OD - exam with scattered IRH - BCVA 20/20 OD - FA (02.02.22) shows no NV OS - OCT shows diabetic macular edema, OU (OS>OD) -- OD noncentral  - discussed findings  - recommended focal laser next visit OD  - 2-3 weeks for Focal laser OD  4,5. Hypertensive retinopathy OU - discussed importance of tight BP control - history of MI 04/2020 s/p stents - monitor  Ophthalmic Meds Ordered this visit:  Meds ordered this encounter  Medications  . Bevacizumab (AVASTIN) SOLN 1.25 mg       Return for Return 2-3 weeks for DFE, OCT, focal laser OD (may go straight to laser).  There are no Patient Instructions on file for this visit.   Explained the diagnoses, plan, and follow up with the patient and they expressed understanding.  Patient expressed understanding of the importance of proper follow up care.   This document serves as a record of services personally performed by Gardiner Sleeper, MD, PhD. It was created on their behalf by Leonie Douglas, an ophthalmic technician. The creation of this record is the provider's dictation and/or  activities during the visit.    Electronically signed by: Leonie Douglas COA, 02/03/21  12:17 PM  Gardiner Sleeper, M.D., Ph.D. Diseases & Surgery of the Retina and [place] 02/03/2021  I have reviewed the above documentation for accuracy and completeness, and I agree with the above. Gardiner Sleeper, M.D., Ph.D. 02/03/21 12:17 PM  Abbreviations: M myopia (nearsighted); A astigmatism; H hyperopia (farsighted); P presbyopia; Mrx spectacle prescription;  CTL contact lenses; OD right eye; OS left eye; OU both eyes  XT exotropia; ET esotropia; PEK punctate epithelial keratitis; PEE punctate epithelial erosions; DES dry eye syndrome; MGD meibomian gland dysfunction; ATs artificial tears; PFAT's preservative free artificial tears; [place] nuclear sclerotic cataract; PSC posterior subcapsular cataract; ERM epi-retinal membrane; PVD posterior vitreous detachment; RD retinal detachment; DM diabetes mellitus; DR diabetic retinopathy; NPDR non-proliferative diabetic retinopathy; PDR proliferative diabetic retinopathy; CSME clinically significant macular edema; DME diabetic macular edema; dbh dot blot hemorrhages; CWS cotton wool spot; POAG primary open angle glaucoma; C/D cup-to-disc ratio; HVF humphrey visual field; GVF goldmann visual field; OCT optical coherence tomography; IOP intraocular pressure; BRVO Branch retinal vein occlusion; CRVO central retinal vein occlusion; CRAO central retinal artery occlusion; BRAO branch retinal artery occlusion; RT retinal tear; SB scleral buckle; PPV pars plana vitrectomy; VH Vitreous hemorrhage; PRP panretinal laser photocoagulation; IVK intravitreal kenalog; VMT vitreomacular traction; MH Macular hole;  NVD neovascularization of the disc; NVE neovascularization elsewhere; AREDS age related eye disease study; ARMD age related  macular degeneration; POAG primary open angle glaucoma; EBMD epithelial/anterior basement membrane dystrophy; ACIOL anterior  chamber intraocular lens; IOL intraocular lens; PCIOL posterior chamber intraocular lens; Phaco/IOL phacoemulsification with intraocular lens placement; [place] photorefractive keratectomy; LASIK laser assisted in situ keratomileusis; HTN hypertension; DM diabetes mellitus; COPD chronic obstructive pulmonary disease

## 2021-02-03 ENCOUNTER — Encounter (INDEPENDENT_AMBULATORY_CARE_PROVIDER_SITE_OTHER): Payer: Self-pay | Admitting: Ophthalmology

## 2021-02-03 ENCOUNTER — Other Ambulatory Visit: Payer: Self-pay

## 2021-02-03 ENCOUNTER — Ambulatory Visit (INDEPENDENT_AMBULATORY_CARE_PROVIDER_SITE_OTHER): Payer: BC Managed Care – PPO | Admitting: Ophthalmology

## 2021-02-03 DIAGNOSIS — I10 Essential (primary) hypertension: Secondary | ICD-10-CM | POA: Diagnosis not present

## 2021-02-03 DIAGNOSIS — H3581 Retinal edema: Secondary | ICD-10-CM | POA: Diagnosis not present

## 2021-02-03 DIAGNOSIS — E113512 Type 2 diabetes mellitus with proliferative diabetic retinopathy with macular edema, left eye: Secondary | ICD-10-CM

## 2021-02-03 DIAGNOSIS — H35033 Hypertensive retinopathy, bilateral: Secondary | ICD-10-CM

## 2021-02-03 DIAGNOSIS — E113311 Type 2 diabetes mellitus with moderate nonproliferative diabetic retinopathy with macular edema, right eye: Secondary | ICD-10-CM | POA: Diagnosis not present

## 2021-02-03 MED ORDER — BEVACIZUMAB CHEMO INJECTION 1.25MG/0.05ML SYRINGE FOR KALEIDOSCOPE
1.2500 mg | INTRAVITREAL | Status: AC | PRN
  Administered 2021-02-03: 1.25 mg via INTRAVITREAL

## 2021-02-10 NOTE — Progress Notes (Signed)
[facility] Note  02/18/2021     CHIEF COMPLAINT Patient presents for Retina Follow Up   HISTORY OF PRESENT ILLNESS: Sarah Kane is a 50 y.o. female who presents to the clinic today for:   HPI    Retina Follow Up    Patient presents with  Diabetic Retinopathy.  In left eye.  This started 2 weeks ago.  I, the attending physician,  performed the HPI with the patient and updated documentation appropriately.          Comments    Patient here for 2 weeks retina follow up for focal laser OD. Patient states vision doing the same. No eye pain.        Last edited by Bernarda Caffey, MD on 02/18/2021 12:48 PM. (History)    Patient here for focal laser OD.   Referring physician: Gevena Cotton MD [place], [place] [address]  HISTORICAL INFORMATION:   Selected notes from the MEDICAL RECORD NUMBER Referred by Dr. Frederico Hamman for diabetic eye exam LEE: [phone], BCVA OD: 20/50 OS: 20/70 Ocular Hx- DM, cataracts PMH- DM, cholesterol    CURRENT MEDICATIONS: No current outpatient medications on file. (Ophthalmic Drugs)   No current facility-administered medications for this visit. (Ophthalmic Drugs)   Current Outpatient Medications (Other)  Medication Sig  . acetaminophen (TYLENOL) 500 MG tablet Take 1,000 mg by mouth every 6 (six) hours as needed for mild pain or headache.   [name] aspirin 81 MG EC tablet Take 1 tablet (81 mg total) by mouth daily. Swallow whole.  [name] atorvastatin (LIPITOR) 80 MG tablet Take 1 tablet (80 mg total) by mouth daily.  . benzonatate (TESSALON) 100 MG capsule Take 1 capsule (100 mg total) by mouth every 8 (eight) hours.  [name] escitalopram (LEXAPRO) 20 MG tablet Take 1 tablet (20 mg total) by mouth daily.  [name] gabapentin (NEURONTIN) 300 MG capsule TAKE 1 CAPSULE BY MOUTH THREE TIMES DAILY  . glimepiride (AMARYL) 4 MG tablet TAKE 2 TABLETS BY MOUTH ONCE DAILY WITH BREAKFAST  . isosorbide mononitrate (IMDUR) 30 MG 24 hr tablet  Take 1.5 tablets (45 mg total) by mouth daily. (Patient taking differently: Take 30 mg by mouth daily. 12/20  Advised to increase dosage to 60 mg daily  - Sande Rives PA Takes (2)  30 mg tablets daily)  . metoprolol succinate (TOPROL-XL) 25 MG 24 hr tablet Take 1 tablet (25 mg total) by mouth 2 (two) times daily with a meal.  . Multiple Vitamins-Minerals (MULTIVITAMIN WOMEN PO) Take 1 tablet by mouth 3 (three) times a week.   [name] OZEMPIC, 1 MG/DOSE, 4 MG/3ML SOPN INJECT 1MG INTO THE SKIN ONCE A WEEK  . prazosin (MINIPRESS) 2 MG capsule Take 1 capsule (2 mg total) by mouth at bedtime. (Patient not taking: Reported on 01/18/2021)  . sitaGLIPtin (JANUVIA) 100 MG tablet Take 1 tablet (100 mg total) by mouth daily.  . ticagrelor (BRILINTA) 90 MG TABS tablet Take 1 tablet (90 mg total) by mouth 2 (two) times daily.  . Vitamin D, Ergocalciferol, (DRISDOL) 1.25 MG (50000 UNIT) CAPS capsule Take 1 capsule by mouth once a week   No current facility-administered medications for this visit. (Other)      REVIEW OF SYSTEMS: ROS    Positive for: Endocrine, Cardiovascular, Eyes   Negative for: Constitutional, Gastrointestinal, Neurological, Skin, Genitourinary, Musculoskeletal, HENT, Respiratory, Psychiatric, Allergic/Imm, Heme/Lymph   Last edited by Theodore Demark, COA on 02/18/2021  9:22 AM. (History)  ALLERGIES Allergies  Allergen Reactions  . Nitroglycerin     Blood pressure dropped, was hospitalized     PAST MEDICAL HISTORY Past Medical History:  Diagnosis Date  . Arthritis   . COVID-19 05/2020  . Diabetes mellitus without complication ([place])   . Hyperlipidemia   . Hypertension    Past Surgical History:  Procedure Laterality Date  . CARDIAC CATHETERIZATION    . CESAREAN SECTION  1990  . CORONARY STENT INTERVENTION N/A 05/20/2020   Procedure: CORONARY STENT INTERVENTION;  Surgeon: Burnell Blanks, MD;  Location: [facility];  Service: Cardiovascular;  Laterality:  N/A;  . CORONARY STENT INTERVENTION N/A 07/22/2020   Procedure: CORONARY STENT INTERVENTION;  Surgeon: Burnell Blanks, MD;  Location: [facility];  Service: Cardiovascular;  Laterality: N/A;  . LEFT HEART CATH AND CORONARY ANGIOGRAPHY N/A 05/20/2020   Procedure: LEFT HEART CATH AND CORONARY ANGIOGRAPHY;  Surgeon: Burnell Blanks, MD;  Location: [facility];  Service: Cardiovascular;  Laterality: N/A;  . TRIGGER FINGER RELEASE Right    thumb    FAMILY HISTORY Family History  Problem Relation Age of Onset  . High blood pressure Mother   . Diabetes Mother   . Diabetes Maternal Grandmother   . High blood pressure Maternal Grandmother   . Heart attack Maternal Grandfather   . Diabetes Maternal Grandfather   . High blood pressure Maternal Grandfather   . High blood pressure Paternal Grandmother   . High blood pressure Paternal Grandfather   . Diabetes Maternal Aunt   . Diabetes Maternal Uncle   . Skin cancer Maternal Uncle     SOCIAL HISTORY Social History   Tobacco Use  . Smoking status: Never Smoker  . Smokeless tobacco: Never Used  Vaping Use  . Vaping Use: Never used  Substance Use Topics  . Alcohol use: No  . Drug use: No         OPHTHALMIC EXAM:  Base Eye Exam    Visual Acuity (Snellen - Linear)      Right Left   Dist [place] 20/25 -2 20/25   Dist ph [place] 20/20 -1 NI       Tonometry (Tonopen, 9:19 AM)      Right Left   Pressure 18 22       Pupils      Dark Light Shape React APD   Right 4 3 Round Brisk None   Left 4 3 Round Brisk None       Visual Fields (Counting fingers)      Left Right    Full Full       Extraocular Movement      Right Left    Full, Ortho Full, Ortho       Neuro/Psych    Oriented x3: Yes   Mood/Affect: Normal       Dilation    Both eyes: 2.5% Phenylephrine @ 9:19 AM        Slit Lamp and Fundus Exam    Slit Lamp Exam      Right Left   Lids/Lashes Dermatochalasis - upper lid, Meibomian gland  dysfunction, mild Telangiectasia Dermatochalasis - upper lid, Meibomian gland dysfunction, mild Telangiectasia   Conjunctiva/Sclera White and quiet White and quiet   Cornea Trace inferior Punctate epithelial erosions, tear film debris Trace inferior Punctate epithelial erosions, tear film debris   Anterior Chamber deep, clear, narrow temporal angle deep, clear, narrow temporal angle   Iris Round and dilated, No NVI  Round and dilated, No NVI   Lens Cortical spokes Cortical spokes   Vitreous trace Vitreous syneresis trace Vitreous syneresis       Fundus Exam      Right Left   Disc Pink and Sharp, Compact Pink and Sharp, Compact   C/D Ratio 0.2 0.2   Macula Flat, good foveal reflex, scattered MA/DBH greatest temporal macula, trace cystic changes -- good targets for focal laser Blunted foveal reflex, scattered IRH/DBH, +edema temporal macula -- slightly improved   Vessels attenuated, mild Tortuocity  early NV nasal to disc - regressing, attenuated, mild tortuousity   Periphery Attached, scattered MA/DBH greatest posteriorly    Attached, scattered MA/DBH greatest posteriorly and nasally, good early 360 PRP changes          IMAGING AND PROCEDURES  Imaging and Procedures for 02/18/2021  OCT, Retina - OU - Both Eyes       Right Eye Quality was good. Central Foveal Thickness: 228. Progression has worsened. Findings include normal foveal contour, intraretinal fluid, no SRF, intraretinal hyper-reflective material (Mild interval increase in cystic changes superior macula).   Left Eye Quality was good. Central Foveal Thickness: 220. Progression has improved. Findings include normal foveal contour, intraretinal fluid, no SRF (interval improvement in IRF temporal mac).   Notes *Images captured and stored on drive  Diagnosis / Impression:  DME OU (OS>OD) OD: Mild interval increase in cystic changes superior macula OS: interval improvement in IRF temporal mac    Clinical management:  See  below  Abbreviations: NFP - Normal foveal profile. CME - cystoid macular edema. PED - pigment epithelial detachment. IRF - intraretinal fluid. SRF - subretinal fluid. EZ - ellipsoid zone. ERM - epiretinal membrane. ORA - outer retinal atrophy. ORT - outer retinal tubulation. SRHM - subretinal hyper-reflective material. IRHM - intraretinal hyper-reflective material        Focal Laser - OD - Right Eye       LASER PROCEDURE NOTE  Diagnosis:   Diabetic macular edema, right eye   Procedure:  Focal laser photocoagulation using slit lamp laser, right eye   Anesthesia:  Topical  Surgeon: Bernarda Caffey, MD, PhD   Informed consent obtained, operative eye marked, and time out performed prior to initiation of laser.   Lumenis [id number] Focal/Grid laser Power: 70 mW Duration: 50 msec  Spot size: 100 microns  # spots: 254 spots placed to scattered MAs and hemes throughout macula.  Complications: None.  RTC: June 9 or later -- DFE/OCT; possible injection  Patient tolerated the procedure well and received written and verbal post-procedure care information/education.                   ASSESSMENT/PLAN:    ICD-10-CM   1. 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 Retinal edema  H35.81 OCT, Retina - OU - Both Eyes  3. Moderate nonproliferative diabetic retinopathy of right eye with macular edema associated with type 2 diabetes mellitus (H[facility])  Q67.6195 Focal Laser - OD - Right Eye  4. Essential hypertension  I10   5. Hypertensive retinopathy of both eyes  H35.033     1,2. Proliferative diabetic retinopathy w/ DME, OS  - s/p IVA OS #1 (02.02.22), #2 (03.04.22), #3 (04.01.22), #4 (05.12.22)  - s/p PRP OS (02.18.22) - BCVA 20/20 OS from 20/30 - FA (02.02.22) shows focal NV nasal periphery OD  - OCT shows OD: Mild interval increase in cystic  changes superior macula; OS: interval improvement in IRF  temporal mac  - IVA informed consent obtained and signed, 02.02.22 (OS) - see procedure note - f/u June 9 or later, DFE, OCT, possible injection  3. Severe Non-proliferative diabetic retinopathy, OD - BCVA 20/20 OD - FA (02.02.22) shows no NV OS - OCT shows diabetic macular edema, OU (OS>OD) -- OD noncentral, slightly increased   - exam shows scattered IRH and good focal laser targets OD - recommend focal laser OD today, 05.27.22 - pt wishes to proceed with laser  - RBA of procedure discussed, questions answered  - informed consent obtained and signed - see procedure note - start Lotemax QID OD x5-7 days -- samples given  4,5. Hypertensive retinopathy OU - discussed importance of tight BP control - history of MI 04/2020 s/p stents - monitor  Ophthalmic Meds Ordered this visit:  No orders of the defined types were placed in this encounter.      Return in about 13 days (around 03/03/2021) for Dilated Exam, OCT, Possible Injxn.  There are no Patient Instructions on file for this visit.   Explained the diagnoses, plan, and follow up with the patient and they expressed understanding.  Patient expressed understanding of the importance of proper follow up care.   This document serves as a record of services personally performed by Gardiner Sleeper, MD, PhD. It was created on their behalf by Leonie Douglas, an ophthalmic technician. The creation of this record is the provider's dictation and/or activities during the visit.    Electronically signed by: Leonie Douglas COA, 02/18/21  1:03 PM   This document serves as a record of services personally performed by Gardiner Sleeper, MD, PhD. It was created on their behalf by San Jetty. Owens Shark, OA an ophthalmic technician. The creation of this record is the provider's dictation and/or activities during the visit.    Electronically signed by: San Jetty. Owens Shark, [place] 05.27.2022 1:03 PM  Gardiner Sleeper, M.D., Ph.D. Diseases & Surgery of the Retina and  [facility] 02/18/2021  I have reviewed the above documentation for accuracy and completeness, and I agree with the above. Gardiner Sleeper, M.D., Ph.D. 02/18/21 1:03 PM   Abbreviations: M myopia (nearsighted); A astigmatism; H hyperopia (farsighted); P presbyopia; Mrx spectacle prescription;  CTL contact lenses; OD right eye; OS left eye; OU both eyes  XT exotropia; ET esotropia; PEK punctate epithelial keratitis; PEE punctate epithelial erosions; DES dry eye syndrome; MGD meibomian gland dysfunction; ATs artificial tears; PFAT's preservative free artificial tears; Paint nuclear sclerotic cataract; PSC posterior subcapsular cataract; ERM epi-retinal membrane; PVD posterior vitreous detachment; RD retinal detachment; DM diabetes mellitus; DR diabetic retinopathy; NPDR non-proliferative diabetic retinopathy; PDR proliferative diabetic retinopathy; CSME clinically significant macular edema; DME diabetic macular edema; dbh dot blot hemorrhages; CWS cotton wool spot; POAG primary open angle glaucoma; C/D cup-to-disc ratio; HVF humphrey visual field; GVF goldmann visual field; OCT optical coherence tomography; IOP intraocular pressure; BRVO Branch retinal vein occlusion; CRVO central retinal vein occlusion; CRAO central retinal artery occlusion; BRAO branch retinal artery occlusion; RT retinal tear; SB scleral buckle; PPV pars plana vitrectomy; VH Vitreous hemorrhage; PRP panretinal laser photocoagulation; IVK intravitreal kenalog; VMT vitreomacular traction; MH Macular hole;  NVD neovascularization of the disc; NVE neovascularization elsewhere; AREDS age related eye disease study; ARMD age related macular degeneration; POAG primary open angle glaucoma; EBMD epithelial/anterior basement membrane dystrophy; ACIOL anterior chamber intraocular lens; IOL intraocular lens; PCIOL posterior chamber intraocular lens;  Phaco/IOL phacoemulsification with intraocular lens placement; [place]  photorefractive keratectomy; LASIK laser assisted in situ keratomileusis; HTN hypertension; DM diabetes mellitus; COPD chronic obstructive pulmonary disease

## 2021-02-18 ENCOUNTER — Ambulatory Visit (INDEPENDENT_AMBULATORY_CARE_PROVIDER_SITE_OTHER): Payer: BC Managed Care – PPO | Admitting: Ophthalmology

## 2021-02-18 ENCOUNTER — Other Ambulatory Visit: Payer: Self-pay

## 2021-02-18 ENCOUNTER — Encounter (INDEPENDENT_AMBULATORY_CARE_PROVIDER_SITE_OTHER): Payer: Self-pay | Admitting: Ophthalmology

## 2021-02-18 DIAGNOSIS — E113512 Type 2 diabetes mellitus with proliferative diabetic retinopathy with macular edema, left eye: Secondary | ICD-10-CM | POA: Diagnosis not present

## 2021-02-18 DIAGNOSIS — H35033 Hypertensive retinopathy, bilateral: Secondary | ICD-10-CM

## 2021-02-18 DIAGNOSIS — H3581 Retinal edema: Secondary | ICD-10-CM

## 2021-02-18 DIAGNOSIS — E113311 Type 2 diabetes mellitus with moderate nonproliferative diabetic retinopathy with macular edema, right eye: Secondary | ICD-10-CM

## 2021-02-18 DIAGNOSIS — I10 Essential (primary) hypertension: Secondary | ICD-10-CM | POA: Diagnosis not present

## 2021-03-02 NOTE — Progress Notes (Signed)
[facility] Note  03/03/2021     CHIEF COMPLAINT Patient presents for Retina Follow Up   HISTORY OF PRESENT ILLNESS: Sarah Kane is a 50 y.o. female who presents to the clinic today for:   HPI     Retina Follow Up   Patient presents with  Diabetic Retinopathy.  In left eye.  Duration of 13 days.  Since onset it is stable.  I, the attending physician,  performed the HPI with the patient and updated documentation appropriately.        Comments   Pt here for 13 day f/u focal laser OD, PDR OS, NPDR OD. Pt states vision is unchanged, no decrease or improvement noted. No ocular pain or discomfort.       Last edited by Bernarda Caffey, MD on 03/03/2021 10:37 AM.    Patient   Referring physician: Gevena Cotton MD [place], [place] [id number]  HISTORICAL INFORMATION:   Selected notes from the MEDICAL RECORD NUMBER Referred by Dr. Frederico Hamman for diabetic eye exam LEE: [phone], BCVA OD: 20/50 OS: 20/70 Ocular Hx- DM, cataracts PMH- DM, cholesterol    CURRENT MEDICATIONS: No current outpatient medications on file. (Ophthalmic Drugs)   No current facility-administered medications for this visit. (Ophthalmic Drugs)   Current Outpatient Medications (Other)  Medication Sig   acetaminophen (TYLENOL) 500 MG tablet Take 1,000 mg by mouth every 6 (six) hours as needed for mild pain or headache.    aspirin 81 MG EC tablet Take 1 tablet (81 mg total) by mouth daily. Swallow whole.   atorvastatin (LIPITOR) 80 MG tablet Take 1 tablet (80 mg total) by mouth daily.   benzonatate (TESSALON) 100 MG capsule Take 1 capsule (100 mg total) by mouth every 8 (eight) hours.   escitalopram (LEXAPRO) 20 MG tablet Take 1 tablet (20 mg total) by mouth daily.   gabapentin (NEURONTIN) 300 MG capsule TAKE 1 CAPSULE BY MOUTH THREE TIMES DAILY   glimepiride (AMARYL) 4 MG tablet TAKE 2 TABLETS BY MOUTH ONCE DAILY WITH BREAKFAST   isosorbide mononitrate (IMDUR) 30 MG  24 hr tablet Take 1.5 tablets (45 mg total) by mouth daily. (Patient taking differently: Take 30 mg by mouth daily. 12/20  Advised to increase dosage to 60 mg daily  - Sande Rives PA Takes (2)  30 mg tablets daily)   metoprolol succinate (TOPROL-XL) 25 MG 24 hr tablet Take 1 tablet (25 mg total) by mouth 2 (two) times daily with a meal.   Multiple Vitamins-Minerals (MULTIVITAMIN WOMEN PO) Take 1 tablet by mouth 3 (three) times a week.    OZEMPIC, 1 MG/DOSE, 4 MG/3ML SOPN INJECT 1MG INTO THE SKIN ONCE A WEEK   prazosin (MINIPRESS) 2 MG capsule Take 1 capsule (2 mg total) by mouth at bedtime. (Patient not taking: Reported on 01/18/2021)   sitaGLIPtin (JANUVIA) 100 MG tablet Take 1 tablet (100 mg total) by mouth daily.   ticagrelor (BRILINTA) 90 MG TABS tablet Take 1 tablet (90 mg total) by mouth 2 (two) times daily.   Vitamin D, Ergocalciferol, (DRISDOL) 1.25 MG (50000 UNIT) CAPS capsule Take 1 capsule by mouth once a week   No current facility-administered medications for this visit. (Other)      REVIEW OF SYSTEMS: ROS   Positive for: Endocrine, Cardiovascular, Eyes Negative for: Constitutional, Gastrointestinal, Neurological, Skin, Genitourinary, Musculoskeletal, HENT, Respiratory, Psychiatric, Allergic/Imm, Heme/Lymph Last edited by Kingsley Spittle, COT on 03/03/2021  8:50 AM.  ALLERGIES Allergies  Allergen Reactions   Nitroglycerin     Blood pressure dropped, was hospitalized     PAST MEDICAL HISTORY Past Medical History:  Diagnosis Date   Arthritis    COVID-19 05/2020   Diabetes mellitus without complication ([place])    Hyperlipidemia    Hypertension    Past Surgical History:  Procedure Laterality Date   CARDIAC CATHETERIZATION     CESAREAN SECTION  1990   CORONARY STENT INTERVENTION N/A 05/20/2020   Procedure: CORONARY STENT INTERVENTION;  Surgeon: Burnell Blanks, MD;  Location: [place] [facility];  Service: Cardiovascular;  Laterality: N/A;   CORONARY  STENT INTERVENTION N/A 07/22/2020   Procedure: CORONARY STENT INTERVENTION;  Surgeon: Burnell Blanks, MD;  Location: [place] [facility];  Service: Cardiovascular;  Laterality: N/A;   LEFT HEART CATH AND CORONARY ANGIOGRAPHY N/A 05/20/2020   Procedure: LEFT HEART CATH AND CORONARY ANGIOGRAPHY;  Surgeon: Burnell Blanks, MD;  Location: [place] [facility];  Service: Cardiovascular;  Laterality: N/A;   TRIGGER FINGER RELEASE Right    thumb    FAMILY HISTORY Family History  Problem Relation Age of Onset   High blood pressure Mother    Diabetes Mother    Diabetes Maternal Grandmother    High blood pressure Maternal Grandmother    Heart attack Maternal Grandfather    Diabetes Maternal Grandfather    High blood pressure Maternal Grandfather    High blood pressure Paternal Grandmother    High blood pressure Paternal Grandfather    Diabetes Maternal Aunt    Diabetes Maternal Uncle    Skin cancer Maternal Uncle     SOCIAL HISTORY Social History   Tobacco Use   Smoking status: Never   Smokeless tobacco: Never  Vaping Use   Vaping Use: Never used  Substance Use Topics   Alcohol use: No   Drug use: No         OPHTHALMIC EXAM:  Base Eye Exam     Visual Acuity (Snellen - Linear)       Right Left   Dist [place] 20/30 +2 20/20 -1   Dist ph [place] 20/20 -1          Tonometry (Tonopen, 9:03 AM)       Right Left   Pressure 21 21         Pupils       Dark Light Shape React APD   Right 4 3 Round Brisk None   Left 4 3 Round Brisk None         Visual Fields (Counting fingers)       Left Right    Full Full         Extraocular Movement       Right Left    Full, Ortho Full, Ortho         Neuro/Psych     Oriented x3: Yes   Mood/Affect: Normal         Dilation     Both eyes: 1.0% Mydriacyl, 2.5% Phenylephrine @ 9:03 AM           Slit Lamp and Fundus Exam     Slit Lamp Exam       Right Left   Lids/Lashes Dermatochalasis - upper lid,  Meibomian gland dysfunction, mild Telangiectasia Dermatochalasis - upper lid, Meibomian gland dysfunction, mild Telangiectasia   Conjunctiva/Sclera White and quiet White and quiet   Cornea Trace inferior Punctate epithelial erosions, tear film debris Trace inferior Punctate epithelial  erosions, tear film debris   Anterior Chamber deep, clear, narrow temporal angle deep, clear, narrow temporal angle   Iris Round and dilated, No NVI Round and dilated, No NVI   Lens Cortical spokes Cortical spokes   Vitreous trace Vitreous syneresis trace Vitreous syneresis         Fundus Exam       Right Left   Disc Pink and Sharp, Compact Pink and Sharp, Compact   C/D Ratio 0.2 0.2   Macula Flat, good foveal reflex, scattered MA/DBH greatest temporal macula, trace cystic changes, early focal laser changes Blunted foveal reflex, scattered IRH/DBH, +edema temporal macula -- slightly improved   Vessels attenuated, mild Tortuocity  early NV nasal to disc - regressing, attenuated, mild tortuousity   Periphery Attached, scattered MA/DBH greatest posteriorly    Attached, scattered MA/DBH greatest posteriorly and nasally, good early 360 PRP changes            IMAGING AND PROCEDURES  Imaging and Procedures for 03/03/2021  OCT, Retina - OU - Both Eyes       Right Eye Quality was good. Central Foveal Thickness: 228. Progression has improved. Findings include normal foveal contour, intraretinal fluid, no SRF, intraretinal hyper-reflective material (Persistent cystic changes greatest superior macula -- slightly improved).   Left Eye Quality was good. Central Foveal Thickness: 220. Progression has been stable. Findings include normal foveal contour, intraretinal fluid, no SRF (persistent IRF/DME temporal mac).   Notes *Images captured and stored on drive  Diagnosis / Impression:  DME OU (OS>OD) OD: Persistent cystic changes greatest superior macula -- slightly improved OS: persistent IRF/DME temporal  mac   Clinical management:  See below  Abbreviations: NFP - Normal foveal profile. CME - cystoid macular edema. PED - pigment epithelial detachment. IRF - intraretinal fluid. SRF - subretinal fluid. EZ - ellipsoid zone. ERM - epiretinal membrane. ORA - outer retinal atrophy. ORT - outer retinal tubulation. SRHM - subretinal hyper-reflective material. IRHM - intraretinal hyper-reflective material      Intravitreal Injection, Pharmacologic Agent - OS - Left Eye       Time Out 03/03/2021. 9:26 AM. Confirmed correct patient, procedure, site, and patient consented.   Anesthesia Topical anesthesia was used. Anesthetic medications included Lidocaine 2%, Proparacaine 0.5%.   Procedure Preparation included 5% betadine to ocular surface, eyelid speculum. A supplied needle was used.   Injection: 1.25 mg Bevacizumab 1.106m/0.05ml   Route: Intravitreal, Site: Left Eye   NDC:: 20947-096-28 Lot: [id number] , Expiration date: 04/06/2021, Waste: 0 mL   Post-op Post injection exam found visual acuity of at least counting fingers. The patient tolerated the procedure well. There were no complications. The patient received written and verbal post procedure care education. Post injection medications were not given.              ASSESSMENT/PLAN:    ICD-10-CM   1. Proliferative diabetic retinopathy of left eye with macular edema associated with type 2 diabetes mellitus (HCC)  EZ66.2947Intravitreal Injection, Pharmacologic Agent - OS - Left Eye    Bevacizumab (AVASTIN) SOLN 1.25 mg    CANCELED: Intravitreal Injection, Pharmacologic Agent - OS - Left Eye    2. Retinal edema  H35.81 OCT, Retina - OU - Both Eyes    3. Moderate nonproliferative diabetic retinopathy of right eye with macular edema associated with type 2 diabetes mellitus ([place]  E11.3311     4. Essential hypertension  I10     5. Hypertensive retinopathy of both eyes  H35.033  1,2. Proliferative diabetic retinopathy w/ DME,  OS  - s/p IVA OS #1 (02.02.22), #2 (03.04.22), #3 (04.01.22), #4 (05.12.22)  - s/p PRP OS (02.18.22) - BCVA improved to 20/20 OS  - FA (02.02.22) shows focal NV nasal periphery OD  - OCT shows OD: Persistent cystic changes greatest superior macula -- slightly improved; OS: persistent IRF/DME temporal mac - IVA informed consent obtained and signed, 02.02.22 (OS) - recommend IVA OS #5 today, 06.09.22 for DME - pt wishes to proceed - RBA of procedure discussed, questions answered - informed consent obtained and signed - see procedure note - f/u 6 weeks DFE, OCT, possible injection vs focal laser  3. Severe Non-proliferative diabetic retinopathy, OD - BCVA 20/20 OD - FA (02.02.22) shows no NV OS - s/p Focal laser OD (05.27.22) - OCT shows diabetic macular edema, OU (OS>OD) -- OD noncentral, slightly improved  - exam shows scattered IRH - f/u 6 weeks, DFE, OCT  4,5. Hypertensive retinopathy OU - discussed importance of tight BP control - history of MI 04/2020 s/p stents - monitor  Ophthalmic Meds Ordered this visit:  Meds ordered this encounter  Medications   Bevacizumab (AVASTIN) SOLN 1.25 mg        Return in about 6 weeks (around 04/14/2021) for f/u PDR OS, DFE, OCT.  There are no Patient Instructions on file for this visit.   Explained the diagnoses, plan, and follow up with the patient and they expressed understanding.  Patient expressed understanding of the importance of proper follow up care.   This document serves as a record of services personally performed by Gardiner Sleeper, MD, PhD. It was created on their behalf by Leonie Douglas, an ophthalmic technician. The creation of this record is the provider's dictation and/or activities during the visit.    Electronically signed by: Leonie Douglas COA, 03/03/21  10:42 AM   Gardiner Sleeper, M.D., Ph.D. Diseases & Surgery of the Retina and [place] 03/03/2021  I have reviewed the above  documentation for accuracy and completeness, and I agree with the above. Gardiner Sleeper, M.D., Ph.D. 03/03/21 10:42 AM   Abbreviations: M myopia (nearsighted); A astigmatism; H hyperopia (farsighted); P presbyopia; Mrx spectacle prescription;  CTL contact lenses; OD right eye; OS left eye; OU both eyes  XT exotropia; ET esotropia; PEK punctate epithelial keratitis; PEE punctate epithelial erosions; DES dry eye syndrome; MGD meibomian gland dysfunction; ATs artificial tears; PFAT's preservative free artificial tears; Duck nuclear sclerotic cataract; PSC posterior subcapsular cataract; ERM epi-retinal membrane; PVD posterior vitreous detachment; RD retinal detachment; DM diabetes mellitus; DR diabetic retinopathy; NPDR non-proliferative diabetic retinopathy; PDR proliferative diabetic retinopathy; CSME clinically significant macular edema; DME diabetic macular edema; dbh dot blot hemorrhages; CWS cotton wool spot; POAG primary open angle glaucoma; C/D cup-to-disc ratio; HVF humphrey visual field; GVF goldmann visual field; OCT optical coherence tomography; IOP intraocular pressure; BRVO Branch retinal vein occlusion; CRVO central retinal vein occlusion; CRAO central retinal artery occlusion; BRAO branch retinal artery occlusion; RT retinal tear; SB scleral buckle; PPV pars plana vitrectomy; VH Vitreous hemorrhage; PRP panretinal laser photocoagulation; IVK intravitreal kenalog; VMT vitreomacular traction; MH Macular hole;  NVD neovascularization of the disc; NVE neovascularization elsewhere; AREDS age related eye disease study; ARMD age related macular degeneration; POAG primary open angle glaucoma; EBMD epithelial/anterior basement membrane dystrophy; ACIOL anterior chamber intraocular lens; IOL intraocular lens; PCIOL posterior chamber intraocular lens; Phaco/IOL phacoemulsification with intraocular lens placement; PRK photorefractive keratectomy; LASIK laser assisted in situ  keratomileusis; HTN hypertension; DM  diabetes mellitus; COPD chronic obstructive pulmonary disease

## 2021-03-03 ENCOUNTER — Ambulatory Visit (INDEPENDENT_AMBULATORY_CARE_PROVIDER_SITE_OTHER): Payer: BC Managed Care – PPO | Admitting: Ophthalmology

## 2021-03-03 ENCOUNTER — Other Ambulatory Visit: Payer: Self-pay

## 2021-03-03 ENCOUNTER — Encounter (INDEPENDENT_AMBULATORY_CARE_PROVIDER_SITE_OTHER): Payer: Self-pay | Admitting: Ophthalmology

## 2021-03-03 DIAGNOSIS — I10 Essential (primary) hypertension: Secondary | ICD-10-CM | POA: Diagnosis not present

## 2021-03-03 DIAGNOSIS — H35033 Hypertensive retinopathy, bilateral: Secondary | ICD-10-CM

## 2021-03-03 DIAGNOSIS — E113512 Type 2 diabetes mellitus with proliferative diabetic retinopathy with macular edema, left eye: Secondary | ICD-10-CM

## 2021-03-03 DIAGNOSIS — H3581 Retinal edema: Secondary | ICD-10-CM | POA: Diagnosis not present

## 2021-03-03 DIAGNOSIS — E113311 Type 2 diabetes mellitus with moderate nonproliferative diabetic retinopathy with macular edema, right eye: Secondary | ICD-10-CM

## 2021-03-03 MED ORDER — BEVACIZUMAB CHEMO INJECTION 1.25MG/0.05ML SYRINGE FOR KALEIDOSCOPE
1.2500 mg | INTRAVITREAL | Status: AC | PRN
  Administered 2021-03-03: 1.25 mg via INTRAVITREAL

## 2021-03-06 NOTE — Progress Notes (Signed)
Cardiology Office Note   Date:  03/07/2021   ID:  Sarah Kane, DOB 1971/04/07, MRN [id number]  PCP:  Sarah Nielsen, DO  Cardiologist: Dr. Dietrich Kane  Pt presents for f/u of CAD      History of Present Illness: Sarah Kane is a 50 y.o. female who presents for f/u of CAD    Sarah Kane has a hx of HTN, DM2 and CAD. She had an NSTEMI 04/2020 at which time she underwent LHC that showed diffuse CAD with 30% pLAD, 99% dLAD, 70% prox LCx, 100% mRCA, 70% dRCA. She underwent PCI/DES to RCA with resolution of symptoms. Per chart review, staged PCI could be considered of the LCx if refractory symptoms. Echo at that time showed an LVEF at 45-50%. Losartan was held due to soft BPs. She was seen in follow up 06/03/20 with continued mild chest discomfort. Imdur was added to her regimen.     After d/c she continued to have angina     She ultimately underwent LHC on 07/22/2020 which showed a proximal LCx lesion at 70% at which time DES/PCI was placed with recommendations for DAPT with ASA and Brilinta for 1 year.  I saw the pt in follow up after   She was doing good    I saw the pt in Dec 2021 Since seen she has done well  Breagthing is OK   She notes rare CP   Does have some leg  pains  Had some at night   Not with activity     Past Medical History:  Diagnosis Date   Arthritis    COVID-19 05/2020   Diabetes mellitus without complication (HCC)    Hyperlipidemia    Hypertension     Past Surgical History:  Procedure Laterality Date   CARDIAC CATHETERIZATION     CESAREAN SECTION  1990   CORONARY STENT INTERVENTION N/A 05/20/2020   Procedure: CORONARY STENT INTERVENTION;  Surgeon: Sarah Hazel, MD;  [facility]: [facility];  Service: Cardiovascular;  Laterality: N/A;   CORONARY STENT INTERVENTION N/A 07/22/2020   Procedure: CORONARY STENT INTERVENTION;  Surgeon: Sarah Hazel, MD;  Location: [facility];  Service: Cardiovascular;  Laterality: N/A;   LEFT HEART  CATH AND CORONARY ANGIOGRAPHY N/A 05/20/2020   Procedure: LEFT HEART CATH AND CORONARY ANGIOGRAPHY;  Surgeon: Sarah Hazel, MD;  Location: [facility];  Service: Cardiovascular;  Laterality: N/A;   TRIGGER FINGER RELEASE Right    thumb     Current Outpatient Medications  Medication Sig Dispense Refill   acetaminophen (TYLENOL) 500 MG tablet Take 1,000 mg by mouth every 6 (six) hours as needed for mild pain or headache.      aspirin 81 MG EC tablet Take 1 tablet (81 mg total) by mouth daily. Swallow whole. 30 tablet 11   atorvastatin (LIPITOR) 80 MG tablet Take 1 tablet (80 mg total) by mouth daily. 90 tablet 3   benzonatate (TESSALON) 100 MG capsule Take 1 capsule (100 mg total) by mouth every 8 (eight) hours. 21 capsule 0   escitalopram (LEXAPRO) 20 MG tablet Take 1 tablet (20 mg total) by mouth daily. 90 tablet 1   gabapentin (NEURONTIN) 300 MG capsule TAKE 1 CAPSULE BY MOUTH THREE TIMES DAILY 270 capsule 3   glimepiride (AMARYL) 4 MG tablet TAKE 2 TABLETS BY MOUTH ONCE DAILY WITH BREAKFAST 180 tablet 1   isosorbide mononitrate (IMDUR) 30 MG 24 hr tablet Take 1.5 tablets (  45 mg total) by mouth daily. (Patient taking differently: Take 30 mg by mouth daily. 12/20  Advised to increase dosage to 60 mg daily  - Sarah Goodrich PA Takes (2)  30 mg tablets daily) 135 tablet 3   metoprolol succinate (TOPROL-XL) 25 MG 24 hr tablet Take 1 tablet (25 mg total) by mouth 2 (two) times daily with a meal. 180 tablet 3   Multiple Vitamins-Minerals (MULTIVITAMIN WOMEN PO) Take 1 tablet by mouth 3 (three) times a week.      OZEMPIC, 1 MG/DOSE, 4 MG/3ML SOPN INJECT 1MG  INTO THE SKIN ONCE A WEEK 15 mL 2   prazosin (MINIPRESS) 2 MG capsule Take 1 capsule (2 mg total) by mouth at bedtime. 90 capsule 0   sitaGLIPtin (JANUVIA) 100 MG tablet Take 1 tablet (100 mg total) by mouth daily. 90 tablet 3   ticagrelor (BRILINTA) 90 MG TABS tablet Take 1 tablet (90 mg total) by mouth 2 (two) times daily. 60  tablet 11   Vitamin D, Ergocalciferol, (DRISDOL) 1.25 MG (50000 UNIT) CAPS capsule Take 1 capsule by mouth once a week 12 capsule 3   No current facility-administered medications for this visit.    Allergies:   Nitroglycerin    Social History:  The patient  reports that she has never smoked. She has never used smokeless tobacco. She reports that she does not drink alcohol and does not use drugs.   Family History:  The patient's family history includes Diabetes in her maternal aunt, maternal grandfather, maternal grandmother, maternal uncle, and mother; Heart attack in her maternal grandfather; High blood pressure in her maternal grandfather, maternal grandmother, mother, paternal grandfather, and paternal grandmother; Skin cancer in her maternal uncle.    ROS:  Please see the history of present illness. Otherwise, review of systems are positive for none.   All other systems are reviewed and negative.    PHYSICAL EXAM: VS:  BP 116/66   Pulse 90   Ht 5' (1.524 m)   Wt 181 lb 12.8 oz (82.5 kg)   SpO2 95%   BMI 35.51 kg/m  , BMI Body mass index is 35.51 kg/m.   General: Obese 50 yo in NAD  Neck: Negative for carotid bruits. No JVD Lungs:Clear to ausculation bilaterally. Cardiovascular: RRR with S1 S2. No murmurs  1+ PT  Extremities: No edema. Radial pulses 2+ bilaterally Neuro: Alert and oriented. No focal deficits.  Psych: Responds to questions appropriately with normal affect.     EKG:  EKG is not ordered today.   Recent Labs: 05/20/2020: TSH 1.494 12/13/2020: ALT 23; BUN 16; Creat 0.78; Hemoglobin 13.3; Platelets 184; Potassium 4.2; Sodium 140    Lipid Panel    Component Value Date/Time   CHOL 129 12/13/2020 0736   CHOL 135 06/18/2020 1226   TRIG 194 (H) 12/13/2020 0736   HDL 57 12/13/2020 0736   HDL 49 06/18/2020 1226   CHOLHDL 2.3 12/13/2020 0736   VLDL 30 05/21/2020 0438   LDLCALC 45 12/13/2020 0736   Wt Readings from Last 3 Encounters:  03/07/21 181 lb 12.8  oz (82.5 kg)  01/18/21 180 lb (81.6 kg)  12/13/20 178 lb (80.7 kg)    Other studies Reviewed: Additional studies/ records that were reviewed today include:  Review of the above records demonstrates:   Cardiac Catheterization 07/22/2020:   Prox Cx lesion is 70% stenosed. A drug-eluting stent was successfully placed using a SYNERGY XD 3.0X16. Post intervention, there is a 0% residual stenosis.   1.  Severe stenosis proximal Circumflex artery 2. Successful PTCA/DES x 1 proximal Circumflex   Recommendations: Continue DAPT with ASA and Brilinta for one year.    Diagnostic Dominance: Right  Intervention       ASSESSMENT AND PLAN:  1. Chest pain with know CAD s/p recent PCI and known residual LCx disease: -Underwent LHC with PCI to RCA 04/2020>>placed on ASA and Brilinta -She was eventually referred for repeat LHC on 07/22/2020 which showed a proximal LCx lesion at 70% at which time DES/PCI was placed with recommendations for DAPT with ASA and Brilinta for 1 year -Continue DAPT, statin and beta blocker   She continues to feel good    Plan for f/u in NOv with discussion on long term   ? Low dose Brilinta  2. HLD: -Lipids in March LDL 45  HDL 57         3. DM2: -A1C 7.7 in March   Discussed diet       4. HTN:  -BP is controlled       5. Cardiomyopathy  Mild LV dysfunction on echo   -Per echo with LVEF at 45-50%   in Aug 2021 WIll repeat to look for recovery since interventions  6  ? PAD   Pulses 1+  Will get dopplers as baseline to confirm  7  Culinary Medicine   Pt very eager to participate in a program when it is running    -    Signed, Sarah Pates, MD  03/07/2021 9:26 AM    [facility] [address] [place], [place], [place]  [address] Phone: [phone]; Fax: [phone]

## 2021-03-07 ENCOUNTER — Other Ambulatory Visit: Payer: Self-pay

## 2021-03-07 ENCOUNTER — Ambulatory Visit: Payer: BC Managed Care – PPO | Admitting: Internal Medicine

## 2021-03-07 ENCOUNTER — Encounter: Payer: Self-pay | Admitting: Internal Medicine

## 2021-03-07 VITALS — BP 116/66 | HR 90 | Ht 60.0 in | Wt 181.8 lb

## 2021-03-07 DIAGNOSIS — R0989 Other specified symptoms and signs involving the circulatory and respiratory systems: Secondary | ICD-10-CM | POA: Diagnosis not present

## 2021-03-07 DIAGNOSIS — I2510 Atherosclerotic heart disease of native coronary artery without angina pectoris: Secondary | ICD-10-CM | POA: Diagnosis not present

## 2021-03-07 DIAGNOSIS — R252 Cramp and spasm: Secondary | ICD-10-CM | POA: Diagnosis not present

## 2021-03-07 NOTE — Patient Instructions (Signed)
Medication Instructions:  NO CHANGES *If you need a refill on your cardiac medications before your next appointment, please call your pharmacy*   Lab Work: NONE  Testing/Procedures: Your physician has requested that you have an echocardiogram. Echocardiography is a painless test that uses sound waves to create images of your heart. It provides your doctor with information about the size and shape of your heart and how well your heart's chambers and valves are working. This procedure takes approximately one hour. There are no restrictions for this procedure.  Your physician has requested that you have a lower extremity arterial duplex. This test is an ultrasound of the arteries in the legs. It looks at arterial blood flow in the legs. Allow one hour for Lower Arterial scans. There are no restrictions or special instructions  At [facility], you and your health needs are our priority.  As part of our continuing mission to provide you with exceptional heart care, we have created designated Provider Care Teams.  These Care Teams include your primary Cardiologist (physician) and Advanced Practice Providers (APPs -  Physician Assistants and Nurse Practitioners) who all work together to provide you with the care you need, when you need it.   Your next appointment:   4-5 month(s)  The format for your next appointment:   In Person  Provider:   You may see Dietrich Pates, MD or one of the following Advanced Practice Providers on your designated Care Team:   Tereso Newcomer, PA-C [place], [place]

## 2021-03-10 ENCOUNTER — Encounter (HOSPITAL_BASED_OUTPATIENT_CLINIC_OR_DEPARTMENT_OTHER): Payer: Self-pay | Admitting: Emergency Medicine

## 2021-03-10 ENCOUNTER — Emergency Department (HOSPITAL_BASED_OUTPATIENT_CLINIC_OR_DEPARTMENT_OTHER)
Admission: EM | Admit: 2021-03-10 | Discharge: 2021-03-10 | Disposition: A | Discharge status: Home / Self Care | Payer: BC Managed Care – PPO | Attending: Emergency Medicine | Admitting: Emergency Medicine

## 2021-03-10 ENCOUNTER — Other Ambulatory Visit: Payer: Self-pay

## 2021-03-10 ENCOUNTER — Emergency Department (HOSPITAL_BASED_OUTPATIENT_CLINIC_OR_DEPARTMENT_OTHER): Payer: BC Managed Care – PPO

## 2021-03-10 DIAGNOSIS — E119 Type 2 diabetes mellitus without complications: Secondary | ICD-10-CM | POA: Diagnosis not present

## 2021-03-10 DIAGNOSIS — Z7984 Long term (current) use of oral hypoglycemic drugs: Secondary | ICD-10-CM | POA: Diagnosis not present

## 2021-03-10 DIAGNOSIS — I2510 Atherosclerotic heart disease of native coronary artery without angina pectoris: Secondary | ICD-10-CM | POA: Diagnosis not present

## 2021-03-10 DIAGNOSIS — Z79899 Other long term (current) drug therapy: Secondary | ICD-10-CM | POA: Diagnosis not present

## 2021-03-10 DIAGNOSIS — R0602 Shortness of breath: Secondary | ICD-10-CM | POA: Diagnosis present

## 2021-03-10 DIAGNOSIS — I10 Essential (primary) hypertension: Secondary | ICD-10-CM | POA: Diagnosis not present

## 2021-03-10 DIAGNOSIS — J209 Acute bronchitis, unspecified: Secondary | ICD-10-CM | POA: Insufficient documentation

## 2021-03-10 DIAGNOSIS — Z28310 Unvaccinated for covid-19: Secondary | ICD-10-CM | POA: Diagnosis not present

## 2021-03-10 DIAGNOSIS — Z7982 Long term (current) use of aspirin: Secondary | ICD-10-CM | POA: Diagnosis not present

## 2021-03-10 DIAGNOSIS — Z8616 Personal history of COVID-19: Secondary | ICD-10-CM | POA: Insufficient documentation

## 2021-03-10 DIAGNOSIS — R0789 Other chest pain: Secondary | ICD-10-CM

## 2021-03-10 LAB — CBC WITH DIFFERENTIAL/PLATELET
Abs Immature Granulocytes: 0.01 10*3/uL (ref 0.00–0.07)
Basophils Absolute: 0 10*3/uL (ref 0.0–0.1)
Basophils Relative: 0 %
Eosinophils Absolute: 0.2 10*3/uL (ref 0.0–0.5)
Eosinophils Relative: 3 %
HCT: 45.7 % (ref 36.0–46.0)
Hemoglobin: 15.5 g/dL — ABNORMAL HIGH (ref 12.0–15.0)
Immature Granulocytes: 0 %
Lymphocytes Relative: 32 %
Lymphs Abs: 1.8 10*3/uL (ref 0.7–4.0)
MCH: 29.6 pg (ref 26.0–34.0)
MCHC: 33.9 g/dL (ref 30.0–36.0)
MCV: 87.2 fL (ref 80.0–100.0)
Monocytes Absolute: 0.5 10*3/uL (ref 0.1–1.0)
Monocytes Relative: 8 %
Neutro Abs: 3.1 10*3/uL (ref 1.7–7.7)
Neutrophils Relative %: 57 %
Platelets: 201 10*3/uL (ref 150–400)
RBC: 5.24 MIL/uL — ABNORMAL HIGH (ref 3.87–5.11)
RDW: 12.4 % (ref 11.5–15.5)
WBC: 5.6 10*3/uL (ref 4.0–10.5)
nRBC: 0 % (ref 0.0–0.2)

## 2021-03-10 LAB — COMPREHENSIVE METABOLIC PANEL
ALT: 29 U/L (ref 0–44)
AST: 28 U/L (ref 15–41)
Albumin: 4 g/dL (ref 3.5–5.0)
Alkaline Phosphatase: 77 U/L (ref 38–126)
Anion gap: 8 (ref 5–15)
BUN: 8 mg/dL (ref 6–20)
CO2: 26 mmol/L (ref 22–32)
Calcium: 9 mg/dL (ref 8.9–10.3)
Chloride: 103 mmol/L (ref 98–111)
Creatinine, Ser: 0.73 mg/dL (ref 0.44–1.00)
GFR, Estimated: >60 mL/min (ref >60)
Glucose, Bld: 156 mg/dL — ABNORMAL HIGH (ref 70–99)
Potassium: 3.8 mmol/L (ref 3.5–5.1)
Sodium: 137 mmol/L (ref 135–145)
Total Bilirubin: 0.3 mg/dL (ref 0.3–1.2)
Total Protein: 7.7 g/dL (ref 6.5–8.1)

## 2021-03-10 LAB — D-DIMER, QUANTITATIVE: D-Dimer, Quant: 0.59 ug/mL-FEU — ABNORMAL HIGH (ref 0.00–0.50)

## 2021-03-10 LAB — TROPONIN I (HIGH SENSITIVITY): Troponin I (High Sensitivity): 3 ng/L (ref <18)

## 2021-03-10 MED ORDER — IOHEXOL 350 MG/ML SOLN
100.0000 mL | Freq: Once | INTRAVENOUS | Status: AC | PRN
  Administered 2021-03-10: 100 mL via INTRAVENOUS

## 2021-03-10 MED ORDER — BENZONATATE 100 MG PO CAPS
100.0000 mg | ORAL_CAPSULE | Freq: Once | ORAL | Status: AC
  Administered 2021-03-10: 100 mg via ORAL
  Filled 2021-03-10: qty 1

## 2021-03-10 MED ORDER — ALBUTEROL SULFATE HFA 108 (90 BASE) MCG/ACT IN AERS
2.0000 | INHALATION_SPRAY | Freq: Once | RESPIRATORY_TRACT | Status: AC
  Administered 2021-03-10: 2 via RESPIRATORY_TRACT
  Filled 2021-03-10: qty 6.7

## 2021-03-10 NOTE — ED Provider Notes (Signed)
[facility] EMERGENCY DEPARTMENT Provider Note   CSN: [id number] Arrival date & time: 03/10/21  1015     History Chief Complaint  Patient presents with   Shortness of Breath    Sarah Kane is a 50 y.o. female.  The history is provided by the patient and medical records.  Shortness of Breath Sarah Kane is a 50 y.o. female who presents to the Emergency Department complaining of sick. She has been feeling poorly since Saturday.  She has significant cough, sore throat, chest soreness (with cough), HA (due to cough).  Cough is productive.  No fever.  Has SOB.  No vomiting, nausea, diarrhea.  No leg edema. Has chronic leg pain - normal for her.    No hx/o DVT/PE.  Not on hormones.  Takes brilinta.  Has a hx/o CAD, DM, HTN.    Works at [employer].  No known sick contacts.  Has not been vaccinated for COVID 19.    Had COVID at the end of September 2021, April 2022    Past Medical History:  Diagnosis Date   Arthritis    COVID-19 05/2020   Diabetes mellitus without complication [facility])    Hyperlipidemia    Hypertension     Patient Active Problem List   Diagnosis Date Noted   Bilateral shoulder pain 09/08/2020   Unstable angina [facility])    Hospital discharge follow-up 06/10/2020   Anxiety with depression 06/10/2020   Dehydration 06/10/2020   Hyperlipidemia LDL goal <70 05/22/2020   CAD (coronary artery disease) 05/22/2020   Ischemic cardiomyopathy 05/22/2020   NSTEMI (non-ST elevated myocardial infarction) (HCC) 05/20/2020   Type 2 diabetes mellitus with hyperglycemia, without long-term current use of insulin (HCC) 10/23/2019   Pain of skin 08/23/2019   Essential hypertension 10/29/2018   Tear of left supraspinatus tendon 08/14/2018   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   Dupuytren's contracture of both hands 07/26/2018   Chronic pain 06/28/2018   ANA positive 06/28/2018   Arthritis of left acromioclavicular joint 10/03/2017   Subacromial bursitis  10/03/2017   Carpal tunnel syndrome 03/06/2017   Right hand pain 10/02/2016   Complex regional pain syndrome 06/16/2016   Trigger finger of right thumb 03/03/2016   Joint synovitis 02/07/2016    Past Surgical History:  Procedure Laterality Date   CARDIAC CATHETERIZATION     CESAREAN SECTION  1990   CORONARY STENT INTERVENTION N/A 05/20/2020   Procedure: CORONARY STENT INTERVENTION;  Surgeon: Kathleene Hazel, MD;  Location: [facility] INVASIVE CV LAB;  Service: Cardiovascular;  Laterality: N/A;   CORONARY STENT INTERVENTION N/A 07/22/2020   Procedure: CORONARY STENT INTERVENTION;  Surgeon: Kathleene Hazel, MD;  Location: [facility] INVASIVE CV LAB;  Service: Cardiovascular;  Laterality: N/A;   LEFT HEART CATH AND CORONARY ANGIOGRAPHY N/A 05/20/2020   Procedure: LEFT HEART CATH AND CORONARY ANGIOGRAPHY;  Surgeon: Kathleene Hazel, MD;  Location: [facility] INVASIVE CV LAB;  Service: Cardiovascular;  Laterality: N/A;   TRIGGER FINGER RELEASE Right    thumb     OB History   No obstetric history on file.     Family History  Problem Relation Age of Onset   High blood pressure Mother    Diabetes Mother    Diabetes Maternal Grandmother    High blood pressure Maternal Grandmother    Heart attack Maternal Grandfather    Diabetes Maternal Grandfather    High blood pressure Maternal Grandfather    High blood pressure Paternal Grandmother    High  blood pressure Paternal Grandfather    Diabetes Maternal Aunt    Diabetes Maternal Uncle    Skin cancer Maternal Uncle     Social History   Tobacco Use   Smoking status: Never   Smokeless tobacco: Never  Vaping Use   Vaping Use: Never used  Substance Use Topics   Alcohol use: No   Drug use: No    Home Medications Prior to Admission medications   Medication Sig Start Date End Date Taking? Authorizing Provider  acetaminophen (TYLENOL) 500 MG tablet Take 1,000 mg by mouth every 6 (six) hours as needed for mild pain or headache.      [provider]  aspirin 81 MG EC tablet Take 1 tablet (81 mg total) by mouth daily. Swallow whole. 06/04/20   Pricilla Riffle, MD  atorvastatin (LIPITOR) 80 MG tablet Take 1 tablet (80 mg total) by mouth daily. 06/04/20   Pricilla Riffle, MD  benzonatate (TESSALON) 100 MG capsule Take 1 capsule (100 mg total) by mouth every 8 (eight) hours. 01/18/21   Moshe Cipro, NP  escitalopram (LEXAPRO) 20 MG tablet Take 1 tablet (20 mg total) by mouth daily. 12/24/20   Sunnie Nielsen, DO  gabapentin (NEURONTIN) 300 MG capsule TAKE 1 CAPSULE BY MOUTH THREE TIMES DAILY 07/09/20   Sunnie Nielsen, DO  glimepiride (AMARYL) 4 MG tablet TAKE 2 TABLETS BY MOUTH ONCE DAILY WITH BREAKFAST 12/23/20   Sunnie Nielsen, DO  isosorbide mononitrate (IMDUR) 30 MG 24 hr tablet Take 1.5 tablets (45 mg total) by mouth daily. Patient taking differently: Take 30 mg by mouth daily. 12/20  Advised to increase dosage to 60 mg daily  - Marjie Skiff PA Takes (2)  30 mg tablets daily 07/15/20   Georgie Chard D, NP  metoprolol succinate (TOPROL-XL) 25 MG 24 hr tablet Take 1 tablet (25 mg total) by mouth 2 (two) times daily with a meal. 06/04/20   Pricilla Riffle, MD  Multiple Vitamins-Minerals (MULTIVITAMIN WOMEN PO) Take 1 tablet by mouth 3 (three) times a week.     [provider]  OZEMPIC, 1 MG/DOSE, 4 MG/3ML SOPN INJECT  INTO THE SKIN ONCE A WEEK 11/29/20   Sunnie Nielsen, DO  prazosin (MINIPRESS) 2 MG capsule Take 1 capsule (2 mg total) by mouth at bedtime. 11/25/20   Sunnie Nielsen, DO  sitaGLIPtin (JANUVIA) 100 MG tablet Take 1 tablet (100 mg total) by mouth daily. 11/25/20   Sunnie Nielsen, DO  ticagrelor (BRILINTA) 90 MG TABS tablet Take 1 tablet (90 mg total) by mouth 2 (two) times daily. 06/04/20   Pricilla Riffle, MD  Vitamin D, Ergocalciferol, (DRISDOL) 1.25 MG (50000 UNIT) CAPS capsule Take 1 capsule by mouth once a week 07/09/20   Sunnie Nielsen, DO    Allergies     Nitroglycerin  Review of Systems   Review of Systems  Respiratory:  Positive for shortness of breath.   All other systems reviewed and are negative.  Physical Exam Updated Vital Signs BP 131/86   Pulse 81   Temp 98.9 F (37.2 C) (Oral)   Resp 14   Ht 5' (1.524 m)   Wt 81.6 kg   SpO2 100%   BMI 35.15 kg/m   Physical Exam Vitals and nursing note reviewed.  Constitutional:      Appearance: She is well-developed.  HENT:     Head: Normocephalic and atraumatic.  Cardiovascular:     Rate and Rhythm: Normal rate and regular rhythm.  Heart sounds: No murmur heard. Pulmonary:     Effort: Pulmonary effort is normal. No respiratory distress.     Breath sounds: Normal breath sounds.     Comments: Frequent coughing Abdominal:     Palpations: Abdomen is soft.     Tenderness: There is no abdominal tenderness. There is no guarding or rebound.  Musculoskeletal:        General: No swelling or tenderness.  Skin:    General: Skin is warm and dry.  Neurological:     Mental Status: She is alert and oriented to person, place, and time.  Psychiatric:        Behavior: Behavior normal.    ED Results / Procedures / Treatments   Labs (all labs ordered are listed, but only abnormal results are displayed) Labs Reviewed  COMPREHENSIVE METABOLIC PANEL - Abnormal; Notable for the following components:      Result Value   Glucose, Bld 156 (*)    All other components within normal limits  CBC WITH DIFFERENTIAL/PLATELET - Abnormal; Notable for the following components:   RBC 5.24 (*)    Hemoglobin 15.5 (*)    All other components within normal limits  D-DIMER, QUANTITATIVE - Abnormal; Notable for the following components:   D-Dimer, Quant 0.59 (*)    All other components within normal limits  TROPONIN I (HIGH SENSITIVITY)    EKG EKG Interpretation  Date/Time:  Thursday March 10 2021 10:28:55 EDT Ventricular Rate:  91 PR Interval:  150 QRS Duration: 79 QT Interval:  373 QTC  Calculation: 459 R Axis:   76 Text Interpretation: Sinus rhythm Low voltage, precordial leads Confirmed by Tilden Fossa [phone]) on 03/10/2021 10:33:21 AM  Radiology CT Angio Chest PE W/Cm &/Or Wo Cm  Result Date: 03/10/2021 CLINICAL DATA:  Cough, positive D-dimer, EXAM: CT ANGIOGRAPHY CHEST WITH CONTRAST TECHNIQUE: Multidetector CT imaging of the chest was performed using the standard protocol during bolus administration of intravenous contrast. Multiplanar CT image reconstructions and MIPs were obtained to evaluate the vascular anatomy. CONTRAST:  OMNIPAQUE IOHEXOL 350 MG/ML SOLN COMPARISON:  None. FINDINGS: Cardiovascular: Probable stenting of the a right and left circumflex coronary arteries. Global cardiac size within normal limits. No pericardial effusion. There is adequate opacification of the pulmonary arterial tree. No intraluminal filling defect identified to suggest acute pulmonary embolism. The central pulmonary arteries are of normal caliber. The thoracic aorta is unremarkable. Mediastinum/Nodes: No enlarged mediastinal, hilar, or axillary lymph nodes. Thyroid gland, trachea, and esophagus demonstrate no significant findings. Lungs/Pleura: Lungs are clear. No pleural effusion or pneumothorax. The central airways are widely patent. Upper Abdomen: No acute abnormality. Musculoskeletal: The osseous structures are age-appropriate. No acute bone abnormality identified. Review of the MIP images confirms the above findings. IMPRESSION: No acute pulmonary embolism. No acute intrathoracic pathology identified. No definite radiographic explanation for the patient's reported symptoms. Electronically Signed   By: Helyn Numbers MD   On: 03/10/2021 12:26   DG Chest Portable 1 View  Result Date: 03/10/2021 CLINICAL DATA:  Cough and shortness of breath EXAM: PORTABLE CHEST 1 VIEW COMPARISON:  01/18/2021 FINDINGS: The heart size and mediastinal contours are within normal limits. Both lungs are clear.  The visualized skeletal structures are unremarkable. IMPRESSION: No active disease. Electronically Signed   By: Alcide Clever M.D.   On: 03/10/2021 11:06    Procedures Procedures   Medications Ordered in ED Medications  albuterol (VENTOLIN HFA) 108 (90 Base) MCG/ACT inhaler 2 puff (2 puffs Inhalation Given 03/10/21 1105)  benzonatate (TESSALON) capsule 100 mg (100 mg Oral Given 03/10/21 1103)  iohexol (OMNIPAQUE) 350 MG/ML injection 100 mL (100 mLs Intravenous Contrast Given 03/10/21 1209)    ED Course  I have reviewed the triage vital signs and the nursing notes.  Pertinent labs & imaging results that were available during my care of the patient were reviewed by me and considered in my medical decision making (see chart for details).    MDM Rules/Calculators/A&P                         Patient with history of coronary artery disease, recent COVID infection here for evaluation of cough, headache and chest pain with coughing and breathing. She is non-toxic appearing on evaluation with no respiratory distress. D dimer was minimally elevated and a CTA was obtained, negative for PE or pneumonia. Presentation is not consistent with ACS, single troponin is negative. No evidence of acute heart failure. Discussed with patient home care for upper respiratory infection with over-the-counter medications. No significant change in symptoms after albuterol administration.  Final Clinical Impression(s) / ED Diagnoses Final diagnoses:  Acute bronchitis, unspecified organism  Chest wall pain    Rx / DC Orders ED Discharge Orders     None        Tilden Fossa, MD 03/10/21 1425

## 2021-03-10 NOTE — ED Notes (Signed)
Ambulated from lobby to room 9.  HR 100-110, SpO2 95-100%.  Constant dry NP cough.

## 2021-03-10 NOTE — ED Triage Notes (Addendum)
Sob and cough since sat getting worse , just barely out of covid she states in  April , saw her cards dr on /613 , was  told she may need echo

## 2021-03-14 ENCOUNTER — Telehealth: Payer: Self-pay

## 2021-03-14 NOTE — Telephone Encounter (Signed)
Transition Care Management Unsuccessful Follow-up Telephone Call  Date of discharge and from where:  03/10/2021 from [facility]  Attempts:  1st Attempt  Reason for unsuccessful TCM follow-up call:  Left voice message

## 2021-03-15 ENCOUNTER — Encounter: Payer: Self-pay | Admitting: Osteopathic Medicine

## 2021-03-15 ENCOUNTER — Other Ambulatory Visit: Payer: Self-pay | Admitting: Nurse Practitioner

## 2021-03-15 ENCOUNTER — Other Ambulatory Visit: Payer: Self-pay | Admitting: Osteopathic Medicine

## 2021-03-15 ENCOUNTER — Telehealth (INDEPENDENT_AMBULATORY_CARE_PROVIDER_SITE_OTHER): Payer: BC Managed Care – PPO | Admitting: Osteopathic Medicine

## 2021-03-15 DIAGNOSIS — J40 Bronchitis, not specified as acute or chronic: Secondary | ICD-10-CM | POA: Diagnosis not present

## 2021-03-15 DIAGNOSIS — E1165 Type 2 diabetes mellitus with hyperglycemia: Secondary | ICD-10-CM

## 2021-03-15 DIAGNOSIS — F418 Other specified anxiety disorders: Secondary | ICD-10-CM

## 2021-03-15 MED ORDER — BENZONATATE 200 MG PO CAPS: 200.0000 mg | ORAL_CAPSULE | Freq: Three times a day (TID) | ORAL | 0 refills | Status: DC | PRN

## 2021-03-15 MED ORDER — PREDNISONE 20 MG PO TABS: 20.0000 mg | ORAL_TABLET | Freq: Two times a day (BID) | ORAL | 0 refills | Status: DC

## 2021-03-15 MED ORDER — ADVAIR HFA 115-21 MCG/ACT IN AERO: 2.0000 | INHALATION_SPRAY | Freq: Two times a day (BID) | RESPIRATORY_TRACT | 0 refills | Status: DC

## 2021-03-15 NOTE — Telephone Encounter (Signed)
Transition Care Management Follow-up Telephone Call Date of discharge and from where: 03/10/2021 from [facility] How have you been since you were released from the hospital? Pt states that she is feeling sick and not much better. Pt did have video visit with PCP this morning.  Any questions or concerns? No

## 2021-03-15 NOTE — Progress Notes (Signed)
Telemedicine Visit via  Audio only - telephone (patient preference /  technical difficulty with MyChart video application)  I connected with [name] on 03/15/21 at 7:23 AM  by phone or  telemedicine application as noted above  I verified that I am speaking with or regarding  the correct patient using two identifiers.  Participants: Myself, Dr Sunnie Nielsen DO Patient: Sarah Kane Patient proxy if applicable: none Other, if applicable: none  Patient is at home I am in office at [facility]    I discussed the limitations of evaluation and management  by telemedicine and the availability of in person appointments.  The participant(s) above expressed understanding and  agreed to proceed with this appointment via telemedicine.       History of Present Illness: Sarah Kane is a 50 y.o. female who would like to discuss feeling sick   Had COVID end of 12/2020, recovered ok Bad coughing since last Saturday  ER visit: CXR and CTA chest ok  Today reports sore throat d/t coughing  She has never been a smoker, but is around her boys and her daughter who are all smokers     Observations/Objective: There were no vitals taken for this visit. BP Readings from Last 3 Encounters:  03/10/21 131/86  03/07/21 116/66  01/18/21 121/84   Exam: Normal Speech.  NAD  Lab and Radiology Results No results found for this or any previous visit (from the past 72 hour(s)). No results found.     Assessment and Plan: 50 y.o. female with The encounter diagnosis was Bronchitis. Sounds like postviral cough issue, will treat w/ ICS/LABA, steroid burst, consider PFT for asthma ?COPD (secondhand smoke exposure long-term)    PDMP not reviewed this encounter. No orders of the defined types were placed in this encounter.  Meds ordered this encounter  Medications   predniSONE (DELTASONE) 20 MG tablet    Sig: Take 1 tablet (20 mg total) by mouth 2 (two) times daily with a  meal.    Dispense:  10 tablet    Refill:  0   fluticasone-salmeterol (ADVAIR HFA) 115-21 MCG/ACT inhaler    Sig: Inhale 2 puffs into the lungs 2 (two) times daily.    Dispense:  1 each    Refill:  0   benzonatate (TESSALON) 200 MG capsule    Sig: Take 1 capsule (200 mg total) by mouth 3 (three) times daily as needed for cough.    Dispense:  30 capsule    Refill:  0   There are no Patient Instructions on file for this visit.  Instructions sent via MyChart.   Follow Up Instructions: Return in about 1 week (around 03/22/2021) for IN-OFFICE VISIT - RECHECK COUGH AND MONITOR A1C .    I discussed the assessment and treatment plan with the patient. The patient was provided an opportunity to ask questions and all were answered. The patient agreed with the plan and demonstrated an understanding of the instructions.   The patient was advised to call back or seek an in-person evaluation if any new concerns, if symptoms worsen or if the condition fails to improve as anticipated.  21 minutes of non-face-to-face time was provided during this encounter.      . . . . . . . . . . . . . [name]                   Historical information moved to improve visibility of documentation.  Past Medical  History:  Diagnosis Date   Arthritis    COVID-19 05/2020   Diabetes mellitus without complication (HCC)    Hyperlipidemia    Hypertension    Past Surgical History:  Procedure Laterality Date   CARDIAC CATHETERIZATION     CESAREAN SECTION  1990   CORONARY STENT INTERVENTION N/A 05/20/2020   Procedure: CORONARY STENT INTERVENTION;  Surgeon: Kathleene Hazel, MD;  Location: [facility] INVASIVE CV LAB;  Service: Cardiovascular;  Laterality: N/A;   CORONARY STENT INTERVENTION N/A 07/22/2020   Procedure: CORONARY STENT INTERVENTION;  Surgeon: Kathleene Hazel, MD;  Location: [facility] INVASIVE CV LAB;  Service: Cardiovascular;  Laterality: N/A;   LEFT HEART CATH AND CORONARY  ANGIOGRAPHY N/A 05/20/2020   Procedure: LEFT HEART CATH AND CORONARY ANGIOGRAPHY;  Surgeon: Kathleene Hazel, MD;  Location: [facility] INVAS[facility] CV LAB;  Service: Cardiovascular;  Laterality: N/A;   TRIGGER FINGER RELEASE Right    thumb   Social History   Tobacco Use   Smoking status: Never   Smokeless tobacco: Never  Substance Use Topics   Alcohol use: No   family history includes Diabetes in her maternal aunt, maternal grandfather, maternal grandmother, maternal uncle, and mother; Heart attack in her maternal grandfather; High blood pressure in her maternal grandfather, maternal grandmother, mother, paternal grandfather, and paternal grandmother; Skin cancer in her maternal uncle.  Medications: Current Outpatient Medications  Medication Sig Dispense Refill   acetaminophen (TYLENOL) 500 MG tablet Take 1,000 mg by mouth every 6 (six) hours as needed for mild pain or headache.      aspirin 81 MG EC tablet Take 1 tablet (81 mg total) by mouth daily. Swallow whole. 30 tablet 11   atorvastatin (LIPITOR) 80 MG tablet Take 1 tablet (80 mg total) by mouth daily. 90 tablet 3   benzonatate (TESSALON) 200 MG capsule Take 1 capsule (200 mg total) by mouth 3 (three) times daily as needed for cough. 30 capsule 0   escitalopram (LEXAPRO) 20 MG tablet Take 1 tablet (20 mg total) by mouth daily. 90 tablet 1   fluticasone-salmeterol (ADVAIR HFA) 115-21 MCG/ACT inhaler Inhale 2 puffs into the lungs 2 (two) times daily. 1 each 0   gabapentin (NEURONTIN) 300 MG capsule TAKE 1 CAPSULE BY MOUTH THREE TIMES DAILY 270 capsule 3   glimepiride (AMARYL) 4 MG tablet TAKE 2 TABLETS BY MOUTH ONCE DAILY WITH BREAKFAST 180 tablet 1   isosorbide mononitrate (IMDUR) 30 MG 24 hr tablet Take 1.5 tablets (45 mg total) by mouth daily. (Patient taking differently: Take 30 mg by mouth daily. 12/20  Advised to increase dosage to 60 mg daily  - Callie Goodrich PA Takes (2)  30 mg tablets daily) 135 tablet 3   metoprolol succinate  (TOPROL-XL) 25 MG 24 hr tablet Take 1 tablet (25 mg total) by mouth 2 (two) times daily with a meal. 180 tablet 3   Multiple Vitamins-Minerals (MULTIVITAMIN WOMEN PO) Take 1 tablet by mouth 3 (three) times a week.      OZEMPIC, 1 MG/DOSE, 4 MG/3ML SOPN INJECT 1MG  INTO THE SKIN ONCE A WEEK 15 mL 2   prazosin (MINIPRESS) 2 MG capsule Take 1 capsule (2 mg total) by mouth at bedtime. 90 capsule 0   predniSONE (DELTASONE) 20 MG tablet Take 1 tablet (20 mg total) by mouth 2 (two) times daily with a meal. 10 tablet 0   sitaGLIPtin (JANUVIA) 100 MG tablet Take 1 tablet (100 mg total) by mouth daily. 90 tablet 3   ticagrelor (BRILINTA) 90  MG TABS tablet Take 1 tablet (90 mg total) by mouth 2 (two) times daily. 60 tablet 11   Vitamin D, Ergocalciferol, (DRISDOL) 1.25 MG (50000 UNIT) CAPS capsule Take 1 capsule by mouth once a week 12 capsule 3   No current facility-administered medications for this visit.   Allergies  Allergen Reactions   Nitroglycerin     Blood pressure dropped, was hospitalized      If phone visit, billing and coding can please add appropriate modifier if needed

## 2021-03-18 ENCOUNTER — Other Ambulatory Visit: Payer: Self-pay

## 2021-03-18 DIAGNOSIS — Z79899 Other long term (current) drug therapy: Secondary | ICD-10-CM | POA: Insufficient documentation

## 2021-03-18 DIAGNOSIS — Z7984 Long term (current) use of oral hypoglycemic drugs: Secondary | ICD-10-CM | POA: Diagnosis not present

## 2021-03-18 DIAGNOSIS — E119 Type 2 diabetes mellitus without complications: Secondary | ICD-10-CM | POA: Diagnosis not present

## 2021-03-18 DIAGNOSIS — Z8616 Personal history of COVID-19: Secondary | ICD-10-CM | POA: Insufficient documentation

## 2021-03-18 DIAGNOSIS — Z7982 Long term (current) use of aspirin: Secondary | ICD-10-CM | POA: Diagnosis not present

## 2021-03-18 DIAGNOSIS — J0140 Acute pansinusitis, unspecified: Secondary | ICD-10-CM | POA: Insufficient documentation

## 2021-03-18 DIAGNOSIS — I10 Essential (primary) hypertension: Secondary | ICD-10-CM | POA: Diagnosis not present

## 2021-03-18 DIAGNOSIS — R0981 Nasal congestion: Secondary | ICD-10-CM | POA: Diagnosis present

## 2021-03-18 DIAGNOSIS — Z955 Presence of coronary angioplasty implant and graft: Secondary | ICD-10-CM | POA: Insufficient documentation

## 2021-03-19 ENCOUNTER — Emergency Department (HOSPITAL_BASED_OUTPATIENT_CLINIC_OR_DEPARTMENT_OTHER): Payer: BC Managed Care – PPO

## 2021-03-19 ENCOUNTER — Emergency Department (HOSPITAL_BASED_OUTPATIENT_CLINIC_OR_DEPARTMENT_OTHER)
Admission: EM | Admit: 2021-03-19 | Discharge: 2021-03-19 | Disposition: A | Discharge status: Home / Self Care | Payer: BC Managed Care – PPO | Attending: Emergency Medicine | Admitting: Emergency Medicine

## 2021-03-19 ENCOUNTER — Encounter (HOSPITAL_BASED_OUTPATIENT_CLINIC_OR_DEPARTMENT_OTHER): Payer: Self-pay | Admitting: Urology

## 2021-03-19 ENCOUNTER — Emergency Department (HOSPITAL_BASED_OUTPATIENT_CLINIC_OR_DEPARTMENT_OTHER)
Admission: EM | Admit: 2021-03-19 | Discharge: 2021-03-20 | Disposition: A | Discharge status: Home / Self Care | Payer: BC Managed Care – PPO | Source: Home / Self Care | Attending: Emergency Medicine | Admitting: Emergency Medicine

## 2021-03-19 ENCOUNTER — Encounter (HOSPITAL_BASED_OUTPATIENT_CLINIC_OR_DEPARTMENT_OTHER): Payer: Self-pay | Admitting: *Deleted

## 2021-03-19 DIAGNOSIS — Z7982 Long term (current) use of aspirin: Secondary | ICD-10-CM | POA: Insufficient documentation

## 2021-03-19 DIAGNOSIS — Z7984 Long term (current) use of oral hypoglycemic drugs: Secondary | ICD-10-CM | POA: Insufficient documentation

## 2021-03-19 DIAGNOSIS — E119 Type 2 diabetes mellitus without complications: Secondary | ICD-10-CM | POA: Insufficient documentation

## 2021-03-19 DIAGNOSIS — I2510 Atherosclerotic heart disease of native coronary artery without angina pectoris: Secondary | ICD-10-CM | POA: Insufficient documentation

## 2021-03-19 DIAGNOSIS — Z79899 Other long term (current) drug therapy: Secondary | ICD-10-CM | POA: Insufficient documentation

## 2021-03-19 DIAGNOSIS — R519 Headache, unspecified: Secondary | ICD-10-CM

## 2021-03-19 DIAGNOSIS — I10 Essential (primary) hypertension: Secondary | ICD-10-CM | POA: Insufficient documentation

## 2021-03-19 DIAGNOSIS — Z8616 Personal history of COVID-19: Secondary | ICD-10-CM | POA: Insufficient documentation

## 2021-03-19 DIAGNOSIS — J0140 Acute pansinusitis, unspecified: Secondary | ICD-10-CM

## 2021-03-19 HISTORY — DX: Old myocardial infarction: I25.2

## 2021-03-19 MED ORDER — AMOXICILLIN-POT CLAVULANATE 875-125 MG PO TABS
1.0000 | ORAL_TABLET | Freq: Once | ORAL | Status: AC
  Administered 2021-03-19: 1 via ORAL
  Filled 2021-03-19: qty 1

## 2021-03-19 MED ORDER — AMOXICILLIN-POT CLAVULANATE 875-125 MG PO TABS: 1.0000 | ORAL_TABLET | Freq: Two times a day (BID) | ORAL | 0 refills | Status: DC

## 2021-03-19 NOTE — ED Triage Notes (Signed)
Pt reports headache x 4 days. Seen here yesterday for same. Endorses nausea. States pain is worse tonight. States she has taken tylenol, sudafed, nyquil and "everything on my med list"

## 2021-03-19 NOTE — ED Triage Notes (Signed)
Pt states Sinus pressure x 1 week, seen here last Thursday for same not getting better, states HA, denies n/v.

## 2021-03-19 NOTE — ED Provider Notes (Signed)
[facility] Provider Note: Lowella Dell, MD, FACEP  CSN: [id number] MRN: [id number] ARRIVAL: 03/18/21 at 2349 ROOM: MH07/MH07   CHIEF COMPLAINT  Sinusitis   HISTORY OF PRESENT ILLNESS  03/19/21 1:15 AM Sarah Kane is a 50 y.o. female who was seen in the emergency department on 03/10/2021 for upper respiratory symptoms (productive cough, sore throat, headache and shortness of breath).  She was treated with albuterol and benzonatate and discharged with a diagnosis of acute bronchitis.  She is here with persistent, and worsening, nasal congestion with sinus pressure and headache.  She rates her pain as a 10 out of 10.  It has not been relieved with over-the-counter medications.  She states she has had both purulent appearing and blood-streaked nasal mucus.  Her headache is exacerbated by coughing.  She denies any nausea, vomiting or diarrhea.  She denies shortness of breath.  She is concerned she has a sinus infection.   Past Medical History:  Diagnosis Date   Arthritis    COVID-19 05/2020   Diabetes mellitus without complication (HCC)    Hyperlipidemia    Hypertension     Past Surgical History:  Procedure Laterality Date   CARDIAC CATHETERIZATION     CESAREAN SECTION  1990   CORONARY STENT INTERVENTION N/A 05/20/2020   Procedure: CORONARY STENT INTERVENTION;  Surgeon: Kathleene Hazel, MD;  Location: [facility];  Service: Cardiovascular;  Laterality: N/A;   CORONARY STENT INTERVENTION N/A 07/22/2020   Procedure: CORONARY STENT INTERVENTION;  Surgeon: Kathleene Hazel, MD;  Location: [facility];  Service: Cardiovascular;  Laterality: N/A;   LEFT HEART CATH AND CORONARY ANGIOGRAPHY N/A 05/20/2020   Procedure: LEFT HEART CATH AND CORONARY ANGIOGRAPHY;  Surgeon: Kathleene Hazel, MD;  Location: [facility];  Service: Cardiovascular;  Laterality: N/A;   TRIGGER FINGER RELEASE Right    thumb    Family History  Problem  Relation Age of Onset   High blood pressure Mother    Diabetes Mother    Diabetes Maternal Grandmother    High blood pressure Maternal Grandmother    Heart attack Maternal Grandfather    Diabetes Maternal Grandfather    High blood pressure Maternal Grandfather    High blood pressure Paternal Grandmother    High blood pressure Paternal Grandfather    Diabetes Maternal Aunt    Diabetes Maternal Uncle    Skin cancer Maternal Uncle     Social History   Tobacco Use   Smoking status: Never   Smokeless tobacco: Never  Vaping Use   Vaping Use: Never used  Substance Use Topics   Alcohol use: No   Drug use: No    Prior to Admission medications   Medication Sig Start Date End Date Taking? Authorizing Provider  amoxicillin-clavulanate (AUGMENTIN) 875-125 MG tablet Take 1 tablet by mouth 2 (two) times daily. One po bid x 7 days 03/19/21  Yes Akio Hudnall, MD  escitalopram (LEXAPRO) 20 MG tablet Take 1 tablet by mouth once daily 03/15/21   Sunnie Nielsen, DO  acetaminophen (TYLENOL) 500 MG tablet Take 1,000 mg by mouth every 6 (six) hours as needed for mild pain or headache.     [provider]  aspirin 81 MG EC tablet Take 1 tablet (81 mg total) by mouth daily. Swallow whole. 06/04/20   Pricilla Riffle, MD  atorvastatin (LIPITOR) 80 MG tablet Take 1 tablet (80 mg total) by mouth daily. 06/04/20   Pricilla Riffle, MD  benzonatate (TESSALON) 200 MG capsule Take 1 capsule (200 mg total) by mouth 3 (three) times daily as needed for cough. 03/15/21   Sunnie Nielsen, DO  fluticasone-salmeterol (ADVAIR HFA) [phone] MCG/ACT inhaler Inhale 2 puffs into the lungs 2 (two) times daily. 03/15/21   Sunnie Nielsen, DO  gabapentin (NEURONTIN) 300 MG capsule TAKE 1 CAPSULE BY MOUTH THREE TIMES DAILY 07/09/20   Sunnie Nielsen, DO  glimepiride (AMARYL) 4 MG tablet TAKE 2 TABLETS BY MOUTH ONCE DAILY WITH BREAKFAST 12/23/20   Sunnie Nielsen, DO  isosorbide mononitrate (IMDUR) 30 MG 24 hr tablet  Take 1.5 tablets (45 mg total) by mouth daily. Patient taking differently: Take 30 mg by mouth daily. 12/20  Advised to increase dosage to 60 mg daily  - Marjie Skiff PA Takes (2)  30 mg tablets daily 07/15/20   Georgie Chard D, NP  metoprolol succinate (TOPROL-XL) 25 MG 24 hr tablet Take 1 tablet (25 mg total) by mouth 2 (two) times daily with a meal. 06/04/20   Pricilla Riffle, MD  Multiple Vitamins-Minerals (MULTIVITAMIN WOMEN PO) Take 1 tablet by mouth 3 (three) times a week.     [provider]  OZEMPIC, 1 MG/DOSE, 4 MG/3ML SOPN INJECT 1MG  INTO THE SKIN ONCE A WEEK 11/29/20   01/29/21, DO  prazosin (MINIPRESS) 2 MG capsule Take 1 capsule (2 mg total) by mouth at bedtime. 11/25/20   01/25/21, DO  predniSONE (DELTASONE) 20 MG tablet Take 1 tablet (20 mg total) by mouth 2 (two) times daily with a meal. 03/15/21   03/17/21, DO  sitaGLIPtin (JANUVIA) 100 MG tablet Take 1 tablet (100 mg total) by mouth daily. 11/25/20   01/25/21, DO  ticagrelor (BRILINTA) 90 MG TABS tablet Take 1 tablet (90 mg total) by mouth 2 (two) times daily. 06/04/20   08/04/20, MD  Vitamin D, Ergocalciferol, (DRISDOL) 1.25 MG (50000 UNIT) CAPS capsule Take 1 capsule by mouth once a week 07/09/20   07/11/20, DO    Allergies Nitroglycerin   REVIEW OF SYSTEMS  Negative except as noted here or in the History of Present Illness.   PHYSICAL EXAMINATION  Initial Vital Signs Blood pressure (!) 134/95, pulse (!) 102, temperature 99.3 F (37.4 C), temperature source Oral, resp. rate 18, height 5' (1.524 m), weight 81.6 kg, SpO2 98 %.  Examination General: Well-developed, well-nourished female in no acute distress; appearance consistent with age of record HENT: normocephalic; atraumatic; tenderness to percussion of frontal and maxillary sinuses; nasal congestion Eyes: pupils equal, round and reactive to light; extraocular muscles intact Neck: supple Heart: regular rate  and rhythm Lungs: clear to auscultation bilaterally Abdomen: soft; nondistended; nontender; bowel sounds present Extremities: No deformity; full range of motion Neurologic: Awake, alert and oriented; motor function intact in all extremities and symmetric; no facial droop Skin: Warm and dry Psychiatric: Whimpering   RESULTS  Summary of this visit's results, reviewed and interpreted by myself:   EKG Interpretation  Date/Time:    Ventricular Rate:    PR Interval:    QRS Duration:   QT Interval:    QTC Calculation:   R Axis:     Text Interpretation:          Laboratory Studies: No results found for this or any previous visit (from the past 24 hour(s)). Imaging Studies: CT Maxillofacial Wo Contrast  Result Date: 03/19/2021 CLINICAL DATA:  Sinus pressure for 1 week.  Maxillofacial pain. EXAM: CT MAXILLOFACIAL WITHOUT CONTRAST TECHNIQUE: Multidetector CT  imaging of the maxillofacial structures was performed. Multiplanar CT image reconstructions were also generated. COMPARISON:  None. FINDINGS: Osseous: No fracture or mandibular dislocation. No destructive process. Orbits: Negative. No traumatic or inflammatory finding. Sinuses: Fluid levels throughout the maxillary sinuses, sphenoid sinuses, and right frontal sinus. There is subtotal opacification of ethmoid air cells. No mastoid effusion. Soft tissues: Negative. Limited intracranial: No significant or unexpected finding. IMPRESSION: Fluid levels throughout the paranasal sinuses in diffuse mucosal thickening consistent with acute sinusitis. Electronically Signed   By: Narda Rutherford M.D.   On: 03/19/2021 02:33    ED COURSE and MDM  Nursing notes, initial and subsequent vitals signs, including pulse oximetry, reviewed and interpreted by myself.  Vitals:   03/19/21 0003 03/19/21 0004 03/19/21 0005  BP: (!) 134/95    Pulse: (!) 102    Resp: 18    Temp: 99.3 F (37.4 C)    TempSrc: Oral    SpO2: 98% 98%   Weight:   81.6 kg   Height:   5' (1.524 m)   Medications  amoxicillin-clavulanate (AUGMENTIN) 875-125 MG per tablet 1 tablet (has no administration in time range)    Presentation and CT consistent with acute sinusitis.  We will treat with antibiotics.  Patient is already on prednisone.  PROCEDURES  Procedures   ED DIAGNOSES     ICD-10-CM   1. Acute non-recurrent pansinusitis  J01.40          Javin Nong, Jonny Ruiz, MD 03/19/21 [phone]

## 2021-03-20 LAB — CBC WITH DIFFERENTIAL/PLATELET
Abs Immature Granulocytes: 0.05 10*3/uL (ref 0.00–0.07)
Basophils Absolute: 0.1 10*3/uL (ref 0.0–0.1)
Basophils Relative: 1 %
Eosinophils Absolute: 0 10*3/uL (ref 0.0–0.5)
Eosinophils Relative: 0 %
HCT: 44.6 % (ref 36.0–46.0)
Hemoglobin: 15 g/dL (ref 12.0–15.0)
Immature Granulocytes: 1 %
Lymphocytes Relative: 13 %
Lymphs Abs: 1.2 10*3/uL (ref 0.7–4.0)
MCH: 29.6 pg (ref 26.0–34.0)
MCHC: 33.6 g/dL (ref 30.0–36.0)
MCV: 88.1 fL (ref 80.0–100.0)
Monocytes Absolute: 0.6 10*3/uL (ref 0.1–1.0)
Monocytes Relative: 7 %
Neutro Abs: 7.3 10*3/uL (ref 1.7–7.7)
Neutrophils Relative %: 78 %
Platelets: 272 10*3/uL (ref 150–400)
RBC: 5.06 MIL/uL (ref 3.87–5.11)
RDW: 13.2 % (ref 11.5–15.5)
WBC: 9.2 10*3/uL (ref 4.0–10.5)
nRBC: 0 % (ref 0.0–0.2)

## 2021-03-20 LAB — BASIC METABOLIC PANEL
Anion gap: 4 — ABNORMAL LOW (ref 5–15)
BUN: 11 mg/dL (ref 6–20)
CO2: 34 mmol/L — ABNORMAL HIGH (ref 22–32)
Calcium: 8.7 mg/dL — ABNORMAL LOW (ref 8.9–10.3)
Chloride: 94 mmol/L — ABNORMAL LOW (ref 98–111)
Creatinine, Ser: 0.83 mg/dL (ref 0.44–1.00)
GFR, Estimated: >60 mL/min (ref >60)
Glucose, Bld: 238 mg/dL — ABNORMAL HIGH (ref 70–99)
Potassium: 3.8 mmol/L (ref 3.5–5.1)
Sodium: 132 mmol/L — ABNORMAL LOW (ref 135–145)

## 2021-03-20 MED ORDER — KETOROLAC TROMETHAMINE 30 MG/ML IJ SOLN
30.0000 mg | Freq: Once | INTRAMUSCULAR | Status: AC
  Administered 2021-03-20: 30 mg via INTRAVENOUS
  Filled 2021-03-20: qty 1

## 2021-03-20 MED ORDER — ACETAMINOPHEN 325 MG PO TABS
650.0000 mg | ORAL_TABLET | Freq: Once | ORAL | Status: AC
  Administered 2021-03-20: 650 mg via ORAL
  Filled 2021-03-20: qty 2

## 2021-03-20 MED ORDER — PROCHLORPERAZINE MALEATE 10 MG PO TABS: 10.0000 mg | ORAL_TABLET | Freq: Four times a day (QID) | ORAL | 0 refills | Status: DC | PRN

## 2021-03-20 MED ORDER — DIPHENHYDRAMINE HCL 50 MG/ML IJ SOLN
25.0000 mg | Freq: Once | INTRAMUSCULAR | Status: AC
  Administered 2021-03-20: 25 mg via INTRAVENOUS
  Filled 2021-03-20: qty 1

## 2021-03-20 MED ORDER — PROCHLORPERAZINE EDISYLATE 10 MG/2ML IJ SOLN
10.0000 mg | Freq: Once | INTRAMUSCULAR | Status: AC
  Administered 2021-03-20: 10 mg via INTRAVENOUS
  Filled 2021-03-20: qty 2

## 2021-03-20 MED ORDER — SODIUM CHLORIDE 0.9 % IV BOLUS
1000.0000 mL | Freq: Once | INTRAVENOUS | Status: AC
  Administered 2021-03-20: 1000 mL via INTRAVENOUS

## 2021-03-20 NOTE — ED Provider Notes (Signed)
[facility] EMERGENCY DEPARTMENT Provider Note   CSN: [id number] Arrival date & time: 03/19/21  2337     History Chief Complaint  Patient presents with   Headache    Sarah Kane is a 50 y.o. female.  The history is provided by the patient.  Headache She has history of hypertension, diabetes, hyperlipidemia, coronary artery disease and comes in because of ongoing sinus headache.  She states that she has had pain in her face when she coughs or sneezes for the last 2 weeks, but pain became constant about 3 days ago.  She was seen in the emergency department yesterday and diagnosed with acute sinusitis and given a prescription for amoxicillin-clavulanic acid.  She has taken 3 doses, but headache has not improved at all.  She has been taking acetaminophen for her pain without relief.  Pain is rated at 10/10.  She denies fever or chills.   Past Medical History:  Diagnosis Date   Arthritis    COVID-19 05/2020   Diabetes mellitus without complication (HCC)    Hyperlipidemia    Hypertension    Myocardial infarct, old     Patient Active Problem List   Diagnosis Date Noted   Bilateral shoulder pain 09/08/2020   Unstable angina [facility])    Hospital discharge follow-up 06/10/2020   Anxiety with depression 06/10/2020   Dehydration 06/10/2020   Hyperlipidemia LDL goal <70 05/22/2020   CAD (coronary artery disease) 05/22/2020   Ischemic cardiomyopathy 05/22/2020   NSTEMI (non-ST elevated myocardial infarction) (HCC) 05/20/2020   Type 2 diabetes mellitus with hyperglycemia, without long-term current use of insulin (HCC) 10/23/2019   Pain of skin 08/23/2019   Essential hypertension 10/29/2018   Tear of left supraspinatus tendon 08/14/2018   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   Dupuytren's contracture of both hands 07/26/2018   Chronic pain 06/28/2018   ANA positive 06/28/2018   Arthritis of left acromioclavicular joint 10/03/2017   Subacromial bursitis 10/03/2017    Carpal tunnel syndrome 03/06/2017   Right hand pain 10/02/2016   Complex regional pain syndrome 06/16/2016   Trigger finger of right thumb 03/03/2016   Joint synovitis 02/07/2016    Past Surgical History:  Procedure Laterality Date   CARDIAC CATHETERIZATION     CESAREAN SECTION  1990   CORONARY STENT INTERVENTION N/A 05/20/2020   Procedure: CORONARY STENT INTERVENTION;  Surgeon: Kathleene Hazel, MD;  [facility];  Service: Cardiovascular;  Laterality: N/A;   CORONARY STENT INTERVENTION N/A 07/22/2020   Procedure: CORONARY STENT INTERVENTION;  Surgeon: Kathleene Hazel, MD;  [facility]: [facility];  Service: Cardiovascular;  Laterality: N/A;   LEFT HEART CATH AND CORONARY ANGIOGRAPHY N/A 05/20/2020   Procedure: LEFT HEART CATH AND CORONARY ANGIOGRAPHY;  Surgeon: Kathleene Hazel, MD;  Location: [facility];  Service: Cardiovascular;  Laterality: N/A;   TRIGGER FINGER RELEASE Right    thumb     OB History   No obstetric history on file.     Family History  Problem Relation Age of Onset   High blood pressure Mother    Diabetes Mother    Diabetes Maternal Grandmother    High blood pressure Maternal Grandmother    Heart attack Maternal Grandfather    Diabetes Maternal Grandfather    High blood pressure Maternal Grandfather    High blood pressure Paternal Grandmother    High blood pressure Paternal Grandfather    Diabetes Maternal Aunt    Diabetes Maternal Uncle  Skin cancer Maternal Uncle     Social History   Tobacco Use   Smoking status: Never   Smokeless tobacco: Never  Vaping Use   Vaping Use: Never used  Substance Use Topics   Alcohol use: No   Drug use: No    Home Medications Prior to Admission medications   Medication Sig Start Date End Date Taking? Authorizing Provider  escitalopram (LEXAPRO) 20 MG tablet Take 1 tablet by mouth once daily 03/15/21   Sunnie Nielsen, DO  acetaminophen (TYLENOL) 500 MG  tablet Take 1,000 mg by mouth every 6 (six) hours as needed for mild pain or headache.     [provider]  amoxicillin-clavulanate (AUGMENTIN) 875-125 MG tablet Take 1 tablet by mouth 2 (two) times daily. One po bid x 7 days 03/19/21   Molpus, John, MD  aspirin 81 MG EC tablet Take 1 tablet (81 mg total) by mouth daily. Swallow whole. 06/04/20   Pricilla Riffle, MD  atorvastatin (LIPITOR) 80 MG tablet Take 1 tablet (80 mg total) by mouth daily. 06/04/20   Pricilla Riffle, MD  benzonatate (TESSALON) 200 MG capsule Take 1 capsule (200 mg total) by mouth 3 (three) times daily as needed for cough. 03/15/21   Sunnie Nielsen, DO  fluticasone-salmeterol (ADVAIR HFA) [phone] MCG/ACT inhaler Inhale 2 puffs into the lungs 2 (two) times daily. 03/15/21   Sunnie Nielsen, DO  gabapentin (NEURONTIN) 300 MG capsule TAKE 1 CAPSULE BY MOUTH THREE TIMES DAILY 07/09/20   Sunnie Nielsen, DO  glimepiride (AMARYL) 4 MG tablet TAKE 2 TABLETS BY MOUTH ONCE DAILY WITH BREAKFAST 12/23/20   Sunnie Nielsen, DO  isosorbide mononitrate (IMDUR) 30 MG 24 hr tablet Take 1.5 tablets (45 mg total) by mouth daily. Patient taking differently: Take 30 mg by mouth daily. 12/20  Advised to increase dosage to 60 mg daily  - Marjie Skiff PA Takes (2)  30 mg tablets daily 07/15/20   Georgie Chard D, NP  metoprolol succinate (TOPROL-XL) 25 MG 24 hr tablet Take 1 tablet (25 mg total) by mouth 2 (two) times daily with a meal. 06/04/20   Pricilla Riffle, MD  Multiple Vitamins-Minerals (MULTIVITAMIN WOMEN PO) Take 1 tablet by mouth 3 (three) times a week.     [provider]  OZEMPIC, 1 MG/DOSE, 4 MG/3ML SOPN INJECT 1MG  INTO THE SKIN ONCE A WEEK 11/29/20   01/29/21, DO  prazosin (MINIPRESS) 2 MG capsule Take 1 capsule (2 mg total) by mouth at bedtime. 11/25/20   01/25/21, DO  predniSONE (DELTASONE) 20 MG tablet Take 1 tablet (20 mg total) by mouth 2 (two) times daily with a meal. 03/15/21   03/17/21,  DO  sitaGLIPtin (JANUVIA) 100 MG tablet Take 1 tablet (100 mg total) by mouth daily. 11/25/20   01/25/21, DO  ticagrelor (BRILINTA) 90 MG TABS tablet Take 1 tablet (90 mg total) by mouth 2 (two) times daily. 06/04/20   08/04/20, MD  Vitamin D, Ergocalciferol, (DRISDOL) 1.25 MG (50000 UNIT) CAPS capsule Take 1 capsule by mouth once a week 07/09/20   07/11/20, DO    Allergies    Nitroglycerin  Review of Systems   Review of Systems  Neurological:  Positive for headaches.  All other systems reviewed and are negative.  Physical Exam Updated Vital Signs BP (!) 145/95 (BP Location: Right Arm)   Pulse (!) 103   Temp 98.9 F (37.2 C) (Oral)   Resp 16   Ht 5' (1.524  m)   Wt 81.6 kg   SpO2 100%   BMI 35.13 kg/m   Physical Exam Vitals and nursing note reviewed.  50 year old female, appears very uncomfortable, but is in no acute distress. Vital signs are significant for elevated blood pressure and borderline elevated heart rate. Oxygen saturation is 100%, which is normal. Head is normocephalic and atraumatic. PERRLA, EOMI. Photophobia is present.  There is marked tenderness to palpation over frontal and maxillary sinuses.  Oropharynx is clear.  Examination of the nasal cavity shows no significant rhinorrhea or edema of turbinates. Neck is nontender and supple without adenopathy or JVD. Back is nontender and there is no CVA tenderness. Lungs are clear without rales, wheezes, or rhonchi. Chest is nontender. Heart has regular rate and rhythm without murmur. Abdomen is soft, flat, nontender without masses or hepatosplenomegaly and peristalsis is normoactive. Extremities have no cyanosis or edema, full range of motion is present. Skin is warm and dry without rash. Neurologic: Mental status is normal, cranial nerves are intact, there are no motor or sensory deficits.  ED Results / Procedures / Treatments   Labs (all labs ordered are listed, but only abnormal results are  displayed) Labs Reviewed  BASIC METABOLIC PANEL - Abnormal; Notable for the following components:      Result Value   Sodium 132 (*)    Chloride 94 (*)    CO2 34 (*)    Glucose, Bld 238 (*)    Calcium 8.7 (*)    Anion gap 4 (*)    All other components within normal limits  CBC WITH DIFFERENTIAL/PLATELET    Radiology CT Maxillofacial Wo Contrast  Result Date: 03/19/2021 CLINICAL DATA:  Sinus pressure for 1 week.  Maxillofacial pain. EXAM: CT MAXILLOFACIAL WITHOUT CONTRAST TECHNIQUE: Multidetector CT imaging of the maxillofacial structures was performed. Multiplanar CT image reconstructions were also generated. COMPARISON:  None. FINDINGS: Osseous: No fracture or mandibular dislocation. No destructive process. Orbits: Negative. No traumatic or inflammatory finding. Sinuses: Fluid levels throughout the maxillary sinuses, sphenoid sinuses, and right frontal sinus. There is subtotal opacification of ethmoid air cells. No mastoid effusion. Soft tissues: Negative. Limited intracranial: No significant or unexpected finding. IMPRESSION: Fluid levels throughout the paranasal sinuses in diffuse mucosal thickening consistent with acute sinusitis. Electronically Signed   By: Narda Rutherford M.D.   On: 03/19/2021 02:33    Procedures Procedures   Medications Ordered in ED Medications  sodium chloride 0.9 % bolus 1,000 mL (0 mLs Intravenous Stopped 03/20/21 0200)  ketorolac (TORADOL) 30 MG/ML injection 30 mg (30 mg Intravenous Given 03/20/21 0053)  diphenhydrAMINE (BENADRYL) injection 25 mg (25 mg Intravenous Given 03/20/21 0054)  prochlorperazine (COMPAZINE) injection 10 mg (10 mg Intravenous Given 03/20/21 0054)  acetaminophen (TYLENOL) tablet 650 mg (650 mg Oral Given 03/20/21 0052)    ED Course  I have reviewed the triage vital signs and the nursing notes.  Pertinent labs & imaging results that were available during my care of the patient were reviewed by me and considered in my medical decision  making (see chart for details).   MDM Rules/Calculators/A&P                         Ongoing headache secondary to acute sinusitis.  Old records reviewed confirming ED visit yesterday with CT showing fluid levels throughout the paranasal sinuses and diffuse mucosal thickening consistent with acute sinusitis.  No fever or nuchal rigidity to suggest meningitis.  She will be given  IV fluids, ketorolac, prochlorperazine and oral acetaminophen.  We will also check screening labs.  Screening labs are unremarkable.  She had excellent relief of headache with above-noted treatment.  She is discharged with prescription for prochlorperazine, told to take over-the-counter NSAIDs and acetaminophen.  Referred to ENT for follow-up.  Final Clinical Impression(s) / ED Diagnoses Final diagnoses:  Sinus headache    Rx / DC Orders ED Discharge Orders          Ordered    prochlorperazine (COMPAZINE) 10 MG tablet  Every 6 hours PRN        03/20/21 0202             Dione Booze, MD 03/20/21 [phone]

## 2021-03-20 NOTE — ED Notes (Signed)
EDP at bedside  

## 2021-03-20 NOTE — ED Notes (Signed)
Pt reports sinus headache while yelling and screaming because of the "headache"

## 2021-03-20 NOTE — Discharge Instructions (Addendum)
Continue taking the antibiotic as prescribed.  Take ibuprofen or naproxen for pain.  For additional pain relief, add acetaminophen.  Combining acetaminophen with either ibuprofen or naproxen gives you better pain relief than either one by itself.  Return if you start running a fever or if pain is not being adequately controlled.

## 2021-03-21 ENCOUNTER — Telehealth: Payer: Self-pay | Admitting: General Practice

## 2021-03-21 NOTE — Telephone Encounter (Signed)
Transition Care Management Unsuccessful Follow-up Telephone Call  Date of discharge and from where:  03/20/21 [facility]  Attempts:  1st Attempt  Reason for unsuccessful TCM follow-up call:  Left voice message

## 2021-03-22 NOTE — Telephone Encounter (Signed)
Transition Care Management Unsuccessful Follow-up Telephone Call  Date of discharge and from where:  03/20/21 from [facility]  Attempts:  2nd Attempt  Reason for unsuccessful TCM follow-up call:  Left voice message

## 2021-03-23 NOTE — Telephone Encounter (Signed)
Transition Care Management Unsuccessful Follow-up Telephone Call  Date of discharge and from where:  03/20/21 from [facility]  Attempts:  3rd Attempt  Reason for unsuccessful TCM follow-up call:  Left voice message

## 2021-03-31 ENCOUNTER — Other Ambulatory Visit (HOSPITAL_COMMUNITY): Payer: BC Managed Care – PPO

## 2021-03-31 ENCOUNTER — Encounter (HOSPITAL_COMMUNITY): Payer: BC Managed Care – PPO

## 2021-04-01 ENCOUNTER — Other Ambulatory Visit (HOSPITAL_COMMUNITY): Payer: BC Managed Care – PPO

## 2021-04-01 ENCOUNTER — Other Ambulatory Visit: Payer: Self-pay | Admitting: Internal Medicine

## 2021-04-01 DIAGNOSIS — R0989 Other specified symptoms and signs involving the circulatory and respiratory systems: Secondary | ICD-10-CM

## 2021-04-01 DIAGNOSIS — R252 Cramp and spasm: Secondary | ICD-10-CM

## 2021-04-13 NOTE — Progress Notes (Signed)
[facility] - Clinic Note  04/14/2021     CHIEF COMPLAINT Patient presents for Retina Follow Up   HISTORY OF PRESENT ILLNESS: Sarah Kane is a 50 y.o. female who presents to the clinic today for:   HPI     Retina Follow Up   Patient presents with  Diabetic Retinopathy.  In both eyes.  This started 6 weeks ago.  I, the attending physician,  performed the HPI with the patient and updated documentation appropriately.        Comments   Patient here for 6 weeks retina follow up for PDR OS, NPDR OD IVA OS VS focal. Patient states vision not well. Feels doing worse. Has had a few headaches. Eye don't feel very strong. No eye pain. Has had a sinus infection.       Last edited by Rennis Chris, MD on 04/14/2021 10:36 AM.       Referring physician: Aura Camps MD [address] [place], [place] [address]  HISTORICAL INFORMATION:   Selected notes from the MEDICAL RECORD NUMBER Referred by Dr. Karleen Hampshire for diabetic eye exam LEE: [phone], BCVA OD: 20/50 OS: 20/70 Ocular Hx- DM, cataracts PMH- DM, cholesterol    CURRENT MEDICATIONS: No current outpatient medications on file. (Ophthalmic Drugs)   No current facility-administered medications for this visit. (Ophthalmic Drugs)   Current Outpatient Medications (Other)  Medication Sig   escitalopram (LEXAPRO) 20 MG tablet Take 1 tablet by mouth once daily   acetaminophen (TYLENOL) 500 MG tablet Take 1,000 mg by mouth every 6 (six) hours as needed for mild pain or headache.    amoxicillin-clavulanate (AUGMENTIN) 875-125 MG tablet Take 1 tablet by mouth 2 (two) times daily. One po bid x 7 days   aspirin 81 MG EC tablet Take 1 tablet (81 mg total) by mouth daily. Swallow whole.   atorvastatin (LIPITOR) 80 MG tablet Take 1 tablet (80 mg total) by mouth daily.   benzonatate (TESSALON) 200 MG capsule Take 1 capsule (200 mg total) by mouth 3 (three) times daily as needed for cough.   fluticasone-salmeterol  (ADVAIR HFA) 115-21 MCG/ACT inhaler Inhale 2 puffs into the lungs 2 (two) times daily.   gabapentin (NEURONTIN) 300 MG capsule TAKE 1 CAPSULE BY MOUTH THREE TIMES DAILY   glimepiride (AMARYL) 4 MG tablet TAKE 2 TABLETS BY MOUTH ONCE DAILY WITH BREAKFAST   isosorbide mononitrate (IMDUR) 30 MG 24 hr tablet Take 1.5 tablets (45 mg total) by mouth daily. (Patient taking differently: Take 30 mg by mouth daily. 12/20  Advised to increase dosage to 60 mg daily  - Marjie Skiff PA Takes (2)  30 mg tablets daily)   metoprolol succinate (TOPROL-XL) 25 MG 24 hr tablet Take 1 tablet (25 mg total) by mouth 2 (two) times daily with a meal.   Multiple Vitamins-Minerals (MULTIVITAMIN WOMEN PO) Take 1 tablet by mouth 3 (three) times a week.    OZEMPIC, 1 MG/DOSE, 4 MG/3ML SOPN INJECT  INTO THE SKIN ONCE A WEEK   prazosin (MINIPRESS) 2 MG capsule Take 1 capsule (2 mg total) by mouth at bedtime.   predniSONE (DELTASONE) 20 MG tablet Take 1 tablet (20 mg total) by mouth 2 (two) times daily with a meal.   prochlorperazine (COMPAZINE) 10 MG tablet Take 1 tablet (10 mg total) by mouth every 6 (six) hours as needed for nausea or vomiting (or headache).   sitaGLIPtin (JANUVIA) 100 MG tablet Take 1 tablet (100 mg total) by mouth daily.  ticagrelor (BRILINTA) 90 MG TABS tablet Take 1 tablet (90 mg total) by mouth 2 (two) times daily.   Vitamin D, Ergocalciferol, (DRISDOL) 1.25 MG (50000 UNIT) CAPS capsule Take 1 capsule by mouth once a week   No current facility-administered medications for this visit. (Other)      REVIEW OF SYSTEMS: ROS   Positive for: Endocrine, Cardiovascular, Eyes Negative for: Constitutional, Gastrointestinal, Neurological, Skin, Genitourinary, Musculoskeletal, HENT, Respiratory, Psychiatric, Allergic/Imm, Heme/Lymph Last edited by Laddie Aquas, COA on 04/14/2021  9:19 AM.        ALLERGIES Allergies  Allergen Reactions   Nitroglycerin     Blood pressure dropped, was hospitalized      PAST MEDICAL HISTORY Past Medical History:  Diagnosis Date   Arthritis    COVID-19 05/2020   Diabetes mellitus without complication (HCC)    Hyperlipidemia    Hypertension    Myocardial infarct, old    Past Surgical History:  Procedure Laterality Date   CARDIAC CATHETERIZATION     CESAREAN SECTION  1990   CORONARY STENT INTERVENTION N/A 05/20/2020   Procedure: CORONARY STENT INTERVENTION;  Surgeon: Kathleene Hazel, MD;  Location: [facility];  Service: Cardiovascular;  Laterality: N/A;   CORONARY STENT INTERVENTION N/A 07/22/2020   Procedure: CORONARY STENT INTERVENTION;  Surgeon: Kathleene Hazel, MD;  Location: [facility];  Service: Cardiovascular;  Laterality: N/A;   LEFT HEART CATH AND CORONARY ANGIOGRAPHY N/A 05/20/2020   Procedure: LEFT HEART CATH AND CORONARY ANGIOGRAPHY;  Surgeon: Kathleene Hazel, MD;  Location: [facility];  Service: Cardiovascular;  Laterality: N/A;   TRIGGER FINGER RELEASE Right    thumb    FAMILY HISTORY Family History  Problem Relation Age of Onset   High blood pressure Mother    Diabetes Mother    Diabetes Maternal Grandmother    High blood pressure Maternal Grandmother    Heart attack Maternal Grandfather    Diabetes Maternal Grandfather    High blood pressure Maternal Grandfather    High blood pressure Paternal Grandmother    High blood pressure Paternal Grandfather    Diabetes Maternal Aunt    Diabetes Maternal Uncle    Skin cancer Maternal Uncle     SOCIAL HISTORY Social History   Tobacco Use   Smoking status: Never   Smokeless tobacco: Never  Vaping Use   Vaping Use: Never used  Substance Use Topics   Alcohol use: No   Drug use: No         OPHTHALMIC EXAM:  Base Eye Exam     Visual Acuity (Snellen - Linear)       Right Left   Dist [place] 20/20 -2 20/25   Dist ph [place]  NI         Tonometry (Tonopen, 9:15 AM)       Right Left   Pressure 14 13         Pupils        Dark Light Shape React APD   Right 4 3 Round Brisk None   Left 4 3 Round Brisk None         Visual Fields (Counting fingers)       Left Right    Full Full         Extraocular Movement       Right Left    Full, Ortho Full, Ortho         Neuro/Psych     Oriented x3: Yes  Mood/Affect: Normal         Dilation     Both eyes: 1.0% Mydriacyl, 2.5% Phenylephrine @ 9:15 AM           Slit Lamp and Fundus Exam     Slit Lamp Exam       Right Left   Lids/Lashes Dermatochalasis - upper lid, Meibomian gland dysfunction, mild Telangiectasia Dermatochalasis - upper lid, Meibomian gland dysfunction, mild Telangiectasia   Conjunctiva/Sclera White and quiet White and quiet   Cornea Trace inferior Punctate epithelial erosions, tear film debris Trace inferior Punctate epithelial erosions, tear film debris   Anterior Chamber deep, clear, narrow temporal angle deep, clear, narrow temporal angle   Iris Round and dilated, No NVI Round and dilated, No NVI   Lens Cortical spokes Cortical spokes   Vitreous trace Vitreous syneresis trace Vitreous syneresis         Fundus Exam       Right Left   Disc Pink and Sharp, Compact Pink and Sharp, Compact   C/D Ratio 0.2 0.2   Macula Flat, good foveal reflex, scattered MA/DBH greatest temporal macula, trace cystic changes, mild focal laser changes Blunted foveal reflex, scattered IRH/DBH, +edema temporal macula -- slightly increased   Vessels attenuated, mild Tortuocity  early NV nasal to disc - regressing, attenuated, mild tortuousity   Periphery Attached, scattered MA/DBH greatest posteriorly    Attached, scattered MA/DBH greatest posteriorly and nasally, good early 360 PRP changes            IMAGING AND PROCEDURES  Imaging and Procedures for 04/14/2021  OCT, Retina - OU - Both Eyes       Right Eye Quality was good. Central Foveal Thickness: 224. Progression has improved. Findings include normal foveal contour, intraretinal  fluid, no SRF, intraretinal hyper-reflective material (Mild cystic changes greatest superior and temporal macula -- persistent).   Left Eye Quality was good. Central Foveal Thickness: 234. Progression has worsened. Findings include intraretinal fluid, no SRF, abnormal foveal contour (Mild interval increase in IRF/edema temporal macula).   Notes *Images captured and stored on drive  Diagnosis / Impression:  DME OU (OS>OD) OD: Mild cystic changes greatest superior and temporal macula -- persistent OS: Mild interval increase in IRF/edema, temporal macula   Clinical management:  See below  Abbreviations: NFP - Normal foveal profile. CME - cystoid macular edema. PED - pigment epithelial detachment. IRF - intraretinal fluid. SRF - subretinal fluid. EZ - ellipsoid zone. ERM - epiretinal membrane. ORA - outer retinal atrophy. ORT - outer retinal tubulation. SRHM - subretinal hyper-reflective material. IRHM - intraretinal hyper-reflective material      Intravitreal Injection, Pharmacologic Agent - OS - Left Eye       Time Out 04/14/2021. 9:38 AM. Confirmed correct patient, procedure, site, and patient consented.   Anesthesia Topical anesthesia was used. Anesthetic medications included Lidocaine 2%, Proparacaine 0.5%.   Procedure Preparation included 5% betadine to ocular surface, eyelid speculum. A (32g) needle was used.   Injection: 1.25 mg Bevacizumab 1.25mg /0.5ml   Route: Intravitreal, Site: Left Eye   NDC: [id number], Lot: [id number], Expiration date: 05/23/2021, Waste: 0.05 mL   Post-op Post injection exam found visual acuity of at least counting fingers. The patient tolerated the procedure well. There were no complications. The patient received written and verbal post procedure care education. Post injection medications were not given.            ASSESSMENT/PLAN:    ICD-10-CM   1. 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 Retinal edema  H35.81 OCT, Retina - OU - Both Eyes    3. Moderate nonproliferative diabetic retinopathy of right eye with macular edema associated with type 2 diabetes mellitus (HCC)  E11.3311     4. Essential hypertension  I10     5. Hypertensive retinopathy of both eyes  H35.033        1,2. Proliferative diabetic retinopathy w/ DME, OS  - s/p IVA OS #1 (02.02.22), #2 (03.04.22), #3 (04.01.22), #4 (05.12.22), #5 (06.09.22)  - s/p PRP OS (02.18.22) - BCVA decreased to 20/25 from 20/20 OS  - FA (02.02.22) shows focal NV nasal periphery OD  - OCT shows OS: Mild interval increase in IRF/edema at 6 wks - recommend IVA OS #6 today, 07.21.22 for DME with interval decrease back to 4 weeks - pt wishes to proceed - RBA of procedure discussed, questions answered - IVA informed consent obtained and signed, 02.02.22 (OS) - see procedure note - f/u 4 weeks DFE, OCT, possible injection vs focal laser OS  3. Severe Non-proliferative diabetic retinopathy, OD - FA (02.02.22) shows no NV OS - s/p Focal laser OD (05.27.22) for DME - BCVA 20/20 OD - OCT shows OD: Mild cystic changes greatest superior and temporal macula -- persistent  - exam shows scattered IRH - f/u 4 weeks, DFE, OCT  4,5. Hypertensive retinopathy OU - discussed importance of tight BP control - history of MI 04/2020 s/p stents - monitor   Ophthalmic Meds Ordered this visit:  Meds ordered this encounter  Medications   Bevacizumab (AVASTIN) SOLN 1.25 mg      Return in about 4 weeks (around 05/12/2021) for f/u PDR OS, DFE, OCT.  There are no Patient Instructions on file for this visit.   Explained the diagnoses, plan, and follow up with the patient and they expressed understanding.  Patient expressed understanding of the importance of proper follow up care.   This document serves as a  record of services personally performed by Karie Chimera, MD, PhD. It was created on their behalf by Joni Reining, an ophthalmic technician. The creation of this record is the provider's dictation and/or activities during the visit.    Electronically signed by: Joni Reining COA, 04/14/21  10:48 AM  This document serves as a record of services personally performed by Karie Chimera, MD, PhD. It was created on their behalf by Glee Arvin. Manson Passey, OA an ophthalmic technician. The creation of this record is the provider's dictation and/or activities during the visit.    Electronically signed by: Glee Arvin. Manson Passey, [place] 07.21.2022 10:48 AM  Karie Chimera, M.D., Ph.D. Diseases & Surgery of the Retina and Vitreous [facility] 04/14/2021  I have reviewed the above documentation for accuracy and completeness, and I agree with the above. Karie Chimera, M.D., Ph.D. 04/14/21 10:48 AM   Abbreviations: M myopia (nearsighted); A astigmatism; H hyperopia (farsighted); P presbyopia; Mrx spectacle prescription;  CTL contact lenses; OD right eye; OS left eye; OU both eyes  XT exotropia; ET esotropia; PEK punctate epithelial keratitis; PEE punctate epithelial erosions; DES dry eye syndrome; MGD meibomian gland dysfunction; ATs artificial tears; PFAT's preservative free artificial tears; NSC nuclear sclerotic cataract; PSC posterior subcapsular cataract; ERM epi-retinal membrane; PVD posterior vitreous detachment; RD retinal detachment; DM diabetes  mellitus; DR diabetic retinopathy; NPDR non-proliferative diabetic retinopathy; PDR proliferative diabetic retinopathy; CSME clinically significant macular edema; DME diabetic macular edema; dbh dot blot hemorrhages; CWS cotton wool spot; POAG primary open angle glaucoma; C/D cup-to-disc ratio; HVF humphrey visual field; GVF goldmann visual field; OCT optical coherence tomography; IOP intraocular pressure; BRVO Branch retinal vein occlusion; CRVO central  retinal vein occlusion; CRAO central retinal artery occlusion; BRAO branch retinal artery occlusion; RT retinal tear; SB scleral buckle; PPV pars plana vitrectomy; VH Vitreous hemorrhage; PRP panretinal laser photocoagulation; IVK intravitreal kenalog; VMT vitreomacular traction; MH Macular hole;  NVD neovascularization of the disc; NVE neovascularization elsewhere; AREDS age related eye disease study; ARMD age related macular degeneration; POAG primary open angle glaucoma; EBMD epithelial/anterior basement membrane dystrophy; ACIOL anterior chamber intraocular lens; IOL intraocular lens; PCIOL posterior chamber intraocular lens; Phaco/IOL phacoemulsification with intraocular lens placement; PRK photorefractive keratectomy; LASIK laser assisted in situ keratomileusis; HTN hypertension; DM diabetes mellitus; COPD chronic obstructive pulmonary disease

## 2021-04-14 ENCOUNTER — Ambulatory Visit (INDEPENDENT_AMBULATORY_CARE_PROVIDER_SITE_OTHER): Payer: BC Managed Care – PPO | Admitting: Ophthalmology

## 2021-04-14 ENCOUNTER — Other Ambulatory Visit: Payer: Self-pay

## 2021-04-14 ENCOUNTER — Encounter (INDEPENDENT_AMBULATORY_CARE_PROVIDER_SITE_OTHER): Payer: Self-pay | Admitting: Ophthalmology

## 2021-04-14 DIAGNOSIS — H3581 Retinal edema: Secondary | ICD-10-CM | POA: Diagnosis not present

## 2021-04-14 DIAGNOSIS — E113311 Type 2 diabetes mellitus with moderate nonproliferative diabetic retinopathy with macular edema, right eye: Secondary | ICD-10-CM | POA: Diagnosis not present

## 2021-04-14 DIAGNOSIS — I10 Essential (primary) hypertension: Secondary | ICD-10-CM

## 2021-04-14 DIAGNOSIS — H35033 Hypertensive retinopathy, bilateral: Secondary | ICD-10-CM

## 2021-04-14 DIAGNOSIS — E113512 Type 2 diabetes mellitus with proliferative diabetic retinopathy with macular edema, left eye: Secondary | ICD-10-CM

## 2021-04-14 MED ORDER — BEVACIZUMAB CHEMO INJECTION 1.25MG/0.05ML SYRINGE FOR KALEIDOSCOPE
1.2500 mg | INTRAVITREAL | Status: AC | PRN
  Administered 2021-04-14: 1.25 mg via INTRAVITREAL

## 2021-04-15 ENCOUNTER — Ambulatory Visit (HOSPITAL_COMMUNITY)
Admission: RE | Admit: 2021-04-15 | Payer: BC Managed Care – PPO | Source: Ambulatory Visit | Attending: Internal Medicine | Admitting: Internal Medicine

## 2021-04-15 ENCOUNTER — Other Ambulatory Visit (HOSPITAL_COMMUNITY): Payer: BC Managed Care – PPO

## 2021-04-15 ENCOUNTER — Encounter (HOSPITAL_COMMUNITY): Payer: Self-pay

## 2021-04-18 ENCOUNTER — Encounter (HOSPITAL_COMMUNITY): Payer: Self-pay | Admitting: Internal Medicine

## 2021-05-02 ENCOUNTER — Telehealth (HOSPITAL_COMMUNITY): Payer: Self-pay | Admitting: Internal Medicine

## 2021-05-02 NOTE — Telephone Encounter (Signed)
Just an FYI. We have made several attempts to contact this patient including sending a letter to schedule or reschedule their echocardiogram. We will be removing the patient from the echo WQ.  04/15/21 NO SHOWED-MAILED LETTER [facility]      Thank you

## 2021-05-11 NOTE — Progress Notes (Addendum)
[facility] - Clinic Note  05/12/2021     CHIEF COMPLAINT Patient presents for Retina Follow Up   HISTORY OF PRESENT ILLNESS: Sarah Kane is a 50 y.o. female who presents to the clinic today for:   HPI     Retina Follow Up   Patient presents with  Diabetic Retinopathy.  In left eye.  This started 4 weeks ago.  I, the attending physician,  performed the HPI with the patient and updated documentation appropriately.        Comments   Patient here for 4 weeks retina follow up for PDR OS. Patient states vision doing the same. No eye pain.      Last edited by Rennis Chris, MD on 05/12/2021 10:55 AM.    Pt states vision is stable, she states her blood sugar is as "controlled as it's going to get" as far as the medications she is on, she states she tries to keep in around 140  Referring physician: Aura Camps MD [address] [place], [place] [address]  HISTORICAL INFORMATION:   Selected notes from the MEDICAL RECORD NUMBER Referred by Dr. Karleen Hampshire for diabetic eye exam LEE: [phone], BCVA OD: 20/50 OS: 20/70 Ocular Hx- DM, cataracts PMH- DM, cholesterol    CURRENT MEDICATIONS: No current outpatient medications on file. (Ophthalmic Drugs)   No current facility-administered medications for this visit. (Ophthalmic Drugs)   Current Outpatient Medications (Other)  Medication Sig   escitalopram (LEXAPRO) 20 MG tablet Take 1 tablet by mouth once daily   acetaminophen (TYLENOL) 500 MG tablet Take 1,000 mg by mouth every 6 (six) hours as needed for mild pain or headache.    amoxicillin-clavulanate (AUGMENTIN) 875-125 MG tablet Take 1 tablet by mouth 2 (two) times daily. One po bid x 7 days   aspirin 81 MG EC tablet Take 1 tablet (81 mg total) by mouth daily. Swallow whole.   atorvastatin (LIPITOR) 80 MG tablet Take 1 tablet (80 mg total) by mouth daily.   benzonatate (TESSALON) 200 MG capsule Take 1 capsule (200 mg total) by mouth 3 (three) times daily  as needed for cough.   fluticasone-salmeterol (ADVAIR HFA) 115-21 MCG/ACT inhaler Inhale 2 puffs into the lungs 2 (two) times daily.   gabapentin (NEURONTIN) 300 MG capsule TAKE 1 CAPSULE BY MOUTH THREE TIMES DAILY   glimepiride (AMARYL) 4 MG tablet TAKE 2 TABLETS BY MOUTH ONCE DAILY WITH BREAKFAST. Needs appointment.   isosorbide mononitrate (IMDUR) 30 MG 24 hr tablet Take 1.5 tablets (45 mg total) by mouth daily. (Patient taking differently: Take 30 mg by mouth daily. 12/20  Advised to increase dosage to 60 mg daily  - Marjie Skiff PA Takes (2)  30 mg tablets daily)   metoprolol succinate (TOPROL-XL) 25 MG 24 hr tablet Take 1 tablet (25 mg total) by mouth 2 (two) times daily with a meal.   Multiple Vitamins-Minerals (MULTIVITAMIN WOMEN PO) Take 1 tablet by mouth 3 (three) times a week.    OZEMPIC, 1 MG/DOSE, 4 MG/3ML SOPN INJECT 1MG  INTO THE SKIN ONCE A WEEK   prazosin (MINIPRESS) 2 MG capsule Take 1 capsule (2 mg total) by mouth at bedtime.   predniSONE (DELTASONE) 20 MG tablet Take 1 tablet (20 mg total) by mouth 2 (two) times daily with a meal.   prochlorperazine (COMPAZINE) 10 MG tablet Take 1 tablet (10 mg total) by mouth every 6 (six) hours as needed for nausea or vomiting (or headache).   sitaGLIPtin (JANUVIA) 100  MG tablet Take 1 tablet (100 mg total) by mouth daily.   ticagrelor (BRILINTA) 90 MG TABS tablet Take 1 tablet (90 mg total) by mouth 2 (two) times daily.   Vitamin D, Ergocalciferol, (DRISDOL) 1.25 MG (50000 UNIT) CAPS capsule Take 1 capsule by mouth once a week   No current facility-administered medications for this visit. (Other)   REVIEW OF SYSTEMS: ROS   Positive for: Endocrine, Cardiovascular, Eyes Negative for: Constitutional, Gastrointestinal, Neurological, Skin, Genitourinary, Musculoskeletal, HENT, Respiratory, Psychiatric, Allergic/Imm, Heme/Lymph Last edited by Laddie Aquas, COA on 05/12/2021  8:56 AM.     ALLERGIES Allergies  Allergen Reactions    Nitroglycerin     Blood pressure dropped, was hospitalized     PAST MEDICAL HISTORY Past Medical History:  Diagnosis Date   Arthritis    COVID-19 05/2020   Diabetes mellitus without complication (HCC)    Hyperlipidemia    Hypertension    Myocardial infarct, old    Past Surgical History:  Procedure Laterality Date   CARDIAC CATHETERIZATION     CESAREAN SECTION  1990   CORONARY STENT INTERVENTION N/A 05/20/2020   Procedure: CORONARY STENT INTERVENTION;  Surgeon: Kathleene Hazel, MD;  Location: [facility];  Service: Cardiovascular;  Laterality: N/A;   CORONARY STENT INTERVENTION N/A 07/22/2020   Procedure: CORONARY STENT INTERVENTION;  Surgeon: Kathleene Hazel, MD;  Location: [facility];  Service: Cardiovascular;  Laterality: N/A;   LEFT HEART CATH AND CORONARY ANGIOGRAPHY N/A 05/20/2020   Procedure: LEFT HEART CATH AND CORONARY ANGIOGRAPHY;  Surgeon: Kathleene Hazel, MD;  Location: [facility];  Service: Cardiovascular;  Laterality: N/A;   TRIGGER FINGER RELEASE Right    thumb    FAMILY HISTORY Family History  Problem Relation Age of Onset   High blood pressure Mother    Diabetes Mother    Diabetes Maternal Grandmother    High blood pressure Maternal Grandmother    Heart attack Maternal Grandfather    Diabetes Maternal Grandfather    High blood pressure Maternal Grandfather    High blood pressure Paternal Grandmother    High blood pressure Paternal Grandfather    Diabetes Maternal Aunt    Diabetes Maternal Uncle    Skin cancer Maternal Uncle     SOCIAL HISTORY Social History   Tobacco Use   Smoking status: Never   Smokeless tobacco: Never  Vaping Use   Vaping Use: Never used  Substance Use Topics   Alcohol use: No   Drug use: No         OPHTHALMIC EXAM:  Base Eye Exam     Visual Acuity (Snellen - Linear)       Right Left   Dist [place] 20/25 -1 20/25   Dist ph [place] 20/20 20/20 -1         Tonometry (Tonopen,  8:54 AM)       Right Left   Pressure 14 13         Pupils       Dark Light Shape React APD   Right 4 3 Round Brisk None   Left 4 3 Round Brisk None         Visual Fields (Counting fingers)       Left Right    Full Full         Extraocular Movement       Right Left    Full, Ortho Full, Ortho         Neuro/Psych  Oriented x3: Yes   Mood/Affect: Normal         Dilation     Both eyes: 1.0% Mydriacyl, 2.5% Phenylephrine @ 8:54 AM           Slit Lamp and Fundus Exam     Slit Lamp Exam       Right Left   Lids/Lashes Dermatochalasis - upper lid, Meibomian gland dysfunction, mild Telangiectasia Dermatochalasis - upper lid, Meibomian gland dysfunction, mild Telangiectasia   Conjunctiva/Sclera White and quiet White and quiet   Cornea Trace inferior Punctate epithelial erosions, tear film debris Trace inferior Punctate epithelial erosions, tear film debris   Anterior Chamber deep, clear, narrow temporal angle deep, clear, narrow temporal angle   Iris Round and dilated, No NVI Round and dilated, No NVI   Lens Cortical spokes Cortical spokes   Vitreous trace Vitreous syneresis trace Vitreous syneresis         Fundus Exam       Right Left   Disc Pink and Sharp, Compact Pink and Sharp, Compact   C/D Ratio 0.2 0.2   Macula Flat, good foveal reflex, scattered MA/DBH greatest temporal macula, trace cystic changes - improved, mild focal laser changes Blunted foveal reflex, scattered IRH/DBH, +edema temporal macula -- slightly improved   Vessels attenuated, mild Tortuocity  early NV nasal to disc - regressing, attenuated, mild tortuousity   Periphery Attached, scattered MA/DBH greatest posteriorly    Attached, scattered MA/DBH greatest posteriorly and nasally, good early 360 PRP changes            IMAGING AND PROCEDURES  Imaging and Procedures for 05/12/2021  OCT, Retina - OU - Both Eyes       Right Eye Quality was good. Central Foveal Thickness:  222. Progression has improved. Findings include normal foveal contour, intraretinal fluid, no SRF, intraretinal hyper-reflective material (Interval improvement in tr cystic changes greatest superior and temporal macula ).   Left Eye Quality was good. Central Foveal Thickness: 231. Progression has improved. Findings include intraretinal fluid, no SRF, abnormal foveal contour (Mild interval improvement in IRF/edema temporal macula).   Notes *Images captured and stored on drive  Diagnosis / Impression:  DME OU (OS>OD) OD: Interval improvement in tr cystic changes greatest superior and temporal macula  OS: Mild interval improvement in IRF/edema, temporal macula   Clinical management:  See below  Abbreviations: NFP - Normal foveal profile. CME - cystoid macular edema. PED - pigment epithelial detachment. IRF - intraretinal fluid. SRF - subretinal fluid. EZ - ellipsoid zone. ERM - epiretinal membrane. ORA - outer retinal atrophy. ORT - outer retinal tubulation. SRHM - subretinal hyper-reflective material. IRHM - intraretinal hyper-reflective material      Intravitreal Injection, Pharmacologic Agent - OS - Left Eye       Time Out 05/12/2021. 9:22 AM. Confirmed correct patient, procedure, site, and patient consented.   Anesthesia Topical anesthesia was used. Anesthetic medications included Lidocaine 2%, Proparacaine 0.5%.   Procedure Preparation included 5% betadine to ocular surface, eyelid speculum. A (32g) needle was used.   Injection: 1.25 mg Bevacizumab 1.25mg /0.6ml   Route: Intravitreal, Site: Left Eye   NDC: [id number], Lot: [id number] , Expiration date: 06/08/2021, Waste: 0 mL   Post-op Post injection exam found visual acuity of at least counting fingers. The patient tolerated the procedure well. There were no complications. The patient received written and verbal post procedure care education. Post injection medications were not given.            ASSESSMENT/PLAN:  ICD-10-CM   1. 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 Retinal edema  H35.81 OCT, Retina - OU - Both Eyes    3. Moderate nonproliferative diabetic retinopathy of right eye with macular edema associated with type 2 diabetes mellitus (HCC)  E11.3311     4. Essential hypertension  I10     5. Hypertensive retinopathy of both eyes  H35.033      1,2. Proliferative diabetic retinopathy w/ DME, OS  - s/p IVA OS #1 (02.02.22), #2 (03.04.22), #3 (04.01.22), #4 (05.12.22), #5 (06.09.22), #6 (07.21.22)  - s/p PRP OS (02.18.22) - BCVA 20/20 OS (improved) - FA (02.02.22) shows no NV OS  - OCT shows OS: Mild interval improvement in IRF/edema, temporal macula at 4 weeks - recommend IVA OS #7 today, 08.18.22 for DME with interval at 4 weeks - pt wishes to proceed - RBA of procedure discussed, questions answered - IVA informed consent obtained and signed, 02.02.22 (OS) - see procedure note - f/u 4 weeks DFE, OCT, possible injection vs focal laser OS  3. Severe Non-proliferative diabetic retinopathy, OD - FA (02.02.22) shows focal NV nasal periphery OD - s/p Focal laser OD (05.27.22) for DME - BCVA 20/20 OD - OCT shows OD: Interval improvement in cystic changes greatest superior and temporal macula  - exam shows scattered IRH - f/u 4 weeks, DFE, OCT  4,5. Hypertensive retinopathy OU - discussed importance of tight BP control - history of MI 04/2020 s/p stents - monitor   Ophthalmic Meds Ordered this visit:  Meds ordered this encounter  Medications   Bevacizumab (AVASTIN) SOLN 1.25 mg       Return in about 4 weeks (around 06/09/2021) for f/u PDR OU, DFE, OCT.  There are no Patient Instructions on file for this visit.   Explained the diagnoses, plan, and follow up with the patient and they expressed  understanding.  Patient expressed understanding of the importance of proper follow up care.   This document serves as a record of services personally performed by Karie Chimera, MD, PhD. It was created on their behalf by Joni Reining, an ophthalmic technician. The creation of this record is the provider's dictation and/or activities during the visit.    Electronically signed by: Joni Reining COA, 05/12/21  11:18 AM  This document serves as a record of services personally performed by Karie Chimera, MD, PhD. It was created on their behalf by Glee Arvin. Manson Passey, OA an ophthalmic technician. The creation of this record is the provider's dictation and/or activities during the visit.    Electronically signed by: Glee Arvin. Kristopher Oppenheim 08.18.2022 11:18 AM  Karie Chimera, M.D., Ph.D. Diseases & Surgery of the Retina and Vitreous [facility] 05/12/2021  I have reviewed the above documentation for accuracy and completeness, and I agree with the above. Karie Chimera, M.D., Ph.D. 05/12/21 11:18 AM  Abbreviations: M myopia (nearsighted); A astigmatism; H hyperopia (farsighted); P presbyopia; Mrx spectacle prescription;  CTL contact lenses; OD right eye; OS left eye; OU both eyes  XT exotropia; ET esotropia; PEK punctate epithelial keratitis; PEE punctate epithelial erosions; DES dry eye syndrome; MGD meibomian gland dysfunction; ATs artificial tears; PFAT's preservative free artificial tears; NSC nuclear sclerotic cataract; PSC posterior subcapsular cataract; ERM epi-retinal membrane; PVD posterior vitreous detachment; RD  retinal detachment; DM diabetes mellitus; DR diabetic retinopathy; NPDR non-proliferative diabetic retinopathy; PDR proliferative diabetic retinopathy; CSME clinically significant macular edema; DME diabetic macular edema; dbh dot blot hemorrhages; CWS cotton wool spot; POAG primary open angle glaucoma; C/D cup-to-disc ratio; HVF humphrey visual field; GVF goldmann  visual field; OCT optical coherence tomography; IOP intraocular pressure; BRVO Branch retinal vein occlusion; CRVO central retinal vein occlusion; CRAO central retinal artery occlusion; BRAO branch retinal artery occlusion; RT retinal tear; SB scleral buckle; PPV pars plana vitrectomy; VH Vitreous hemorrhage; PRP panretinal laser photocoagulation; IVK intravitreal kenalog; VMT vitreomacular traction; MH Macular hole;  NVD neovascularization of the disc; NVE neovascularization elsewhere; AREDS age related eye disease study; ARMD age related macular degeneration; POAG primary open angle glaucoma; EBMD epithelial/anterior basement membrane dystrophy; ACIOL anterior chamber intraocular lens; IOL intraocular lens; PCIOL posterior chamber intraocular lens; Phaco/IOL phacoemulsification with intraocular lens placement; PRK photorefractive keratectomy; LASIK laser assisted in situ keratomileusis; HTN hypertension; DM diabetes mellitus; COPD chronic obstructive pulmonary disease

## 2021-05-12 ENCOUNTER — Encounter (INDEPENDENT_AMBULATORY_CARE_PROVIDER_SITE_OTHER): Payer: Self-pay | Admitting: Ophthalmology

## 2021-05-12 ENCOUNTER — Other Ambulatory Visit: Payer: Self-pay

## 2021-05-12 ENCOUNTER — Ambulatory Visit (INDEPENDENT_AMBULATORY_CARE_PROVIDER_SITE_OTHER): Payer: BC Managed Care – PPO | Admitting: Ophthalmology

## 2021-05-12 DIAGNOSIS — I10 Essential (primary) hypertension: Secondary | ICD-10-CM

## 2021-05-12 DIAGNOSIS — E113311 Type 2 diabetes mellitus with moderate nonproliferative diabetic retinopathy with macular edema, right eye: Secondary | ICD-10-CM

## 2021-05-12 DIAGNOSIS — E113512 Type 2 diabetes mellitus with proliferative diabetic retinopathy with macular edema, left eye: Secondary | ICD-10-CM | POA: Diagnosis not present

## 2021-05-12 DIAGNOSIS — H35033 Hypertensive retinopathy, bilateral: Secondary | ICD-10-CM

## 2021-05-12 DIAGNOSIS — H3581 Retinal edema: Secondary | ICD-10-CM

## 2021-05-12 MED ORDER — BEVACIZUMAB CHEMO INJECTION 1.25MG/0.05ML SYRINGE FOR KALEIDOSCOPE
1.2500 mg | INTRAVITREAL | Status: AC | PRN
  Administered 2021-05-12: 1.25 mg via INTRAVITREAL

## 2021-05-18 ENCOUNTER — Other Ambulatory Visit: Payer: Self-pay | Admitting: Cardiology

## 2021-06-08 NOTE — Progress Notes (Signed)
[facility] - Clinic Note  06/09/2021     CHIEF COMPLAINT Patient presents for Retina Follow Up   HISTORY OF PRESENT ILLNESS: Sarah Kane is a 50 y.o. female who presents to the clinic today for:   HPI     Retina Follow Up   Patient presents with  Diabetic Retinopathy.  In both eyes.  This started months ago.  Severity is mild.  Duration of 4 weeks.  Since onset it is stable.  I, the attending physician,  performed the HPI with the patient and updated documentation appropriately.        Comments   50 y/o female pt here for 4 wk f/u for PDR w/DME OS, NPDR OD.  No change in DVA OU.  Still struggles with seeing at near.  Denies pain, FOL, floaters.  No gtts.  BS unknown.  A1C 7.2.      Last edited by Rennis Chris, MD on 06/10/2021 10:45 PM.    Pt states she did not have her Ozempic for a couple of weeks, but she is not sure if her blood sugar was high bc she doesn't check it at home  Referring physician: Aura Camps MD [address] [place], [place] [address]  HISTORICAL INFORMATION:   Selected notes from the MEDICAL RECORD NUMBER Referred by Dr. Karleen Hampshire for diabetic eye exam LEE: [phone], BCVA OD: 20/50 OS: 20/70 Ocular Hx- DM, cataracts PMH- DM, cholesterol    CURRENT MEDICATIONS: No current outpatient medications on file. (Ophthalmic Drugs)   No current facility-administered medications for this visit. (Ophthalmic Drugs)   Current Outpatient Medications (Other)  Medication Sig   acetaminophen (TYLENOL) 500 MG tablet Take 1,000 mg by mouth every 6 (six) hours as needed for mild pain or headache.    amoxicillin-clavulanate (AUGMENTIN) 875-125 MG tablet Take 1 tablet by mouth 2 (two) times daily. One po bid x 7 days   aspirin 81 MG EC tablet Take 1 tablet (81 mg total) by mouth daily. Swallow whole.   atorvastatin (LIPITOR) 80 MG tablet Take 1 tablet (80 mg total) by mouth daily.   benzonatate (TESSALON) 200 MG capsule Take 1 capsule (200  mg total) by mouth 3 (three) times daily as needed for cough.   escitalopram (LEXAPRO) 20 MG tablet Take 1 tablet by mouth once daily   fluticasone-salmeterol (ADVAIR HFA) 115-21 MCG/ACT inhaler Inhale 2 puffs into the lungs 2 (two) times daily.   gabapentin (NEURONTIN) 300 MG capsule TAKE 1 CAPSULE BY MOUTH THREE TIMES DAILY   glimepiride (AMARYL) 4 MG tablet TAKE 2 TABLETS BY MOUTH ONCE DAILY WITH BREAKFAST. Needs appointment.   isosorbide mononitrate (IMDUR) 30 MG 24 hr tablet TAKE 1 & 1/2 (ONE & ONE-HALF) TABLETS BY MOUTH ONCE DAILY   metoprolol succinate (TOPROL-XL) 25 MG 24 hr tablet Take 1 tablet (25 mg total) by mouth 2 (two) times daily with a meal.   Multiple Vitamins-Minerals (MULTIVITAMIN WOMEN PO) Take 1 tablet by mouth 3 (three) times a week.    OZEMPIC, 1 MG/DOSE, 4 MG/3ML SOPN INJECT 1MG  INTO THE SKIN ONCE A WEEK   prazosin (MINIPRESS) 2 MG capsule Take 1 capsule (2 mg total) by mouth at bedtime.   predniSONE (DELTASONE) 20 MG tablet Take 1 tablet (20 mg total) by mouth 2 (two) times daily with a meal.   prochlorperazine (COMPAZINE) 10 MG tablet Take 1 tablet (10 mg total) by mouth every 6 (six) hours as needed for nausea or vomiting (or headache).  sitaGLIPtin (JANUVIA) 100 MG tablet Take 1 tablet (100 mg total) by mouth daily.   ticagrelor (BRILINTA) 90 MG TABS tablet Take 1 tablet (90 mg total) by mouth 2 (two) times daily.   Vitamin D, Ergocalciferol, (DRISDOL) 1.25 MG (50000 UNIT) CAPS capsule Take 1 capsule by mouth once a week   No current facility-administered medications for this visit. (Other)   REVIEW OF SYSTEMS: ROS   Positive for: Neurological, Musculoskeletal, Endocrine, Eyes Negative for: Constitutional, Gastrointestinal, Skin, Genitourinary, HENT, Cardiovascular, Respiratory, Psychiatric, Allergic/Imm, Heme/Lymph Last edited by Celine Mans, COA on 06/09/2021  9:12 AM.      ALLERGIES Allergies  Allergen Reactions   Nitroglycerin     Blood pressure  dropped, was hospitalized     PAST MEDICAL HISTORY Past Medical History:  Diagnosis Date   Arthritis    COVID-19 05/2020   Diabetes mellitus without complication (HCC)    Diabetic retinopathy (HCC)    Hyperlipidemia    Hypertension    Hypertensive retinopathy    Myocardial infarct, old    Past Surgical History:  Procedure Laterality Date   CARDIAC CATHETERIZATION     CESAREAN SECTION  1990   CORONARY STENT INTERVENTION N/A 05/20/2020   Procedure: CORONARY STENT INTERVENTION;  Surgeon: Kathleene Hazel, MD;  [facility]: [facility];  Service: Cardiovascular;  Laterality: N/A;   CORONARY STENT INTERVENTION N/A 07/22/2020   Procedure: CORONARY STENT INTERVENTION;  Surgeon: Kathleene Hazel, MD;  Location: [facility];  Service: Cardiovascular;  Laterality: N/A;   LEFT HEART CATH AND CORONARY ANGIOGRAPHY N/A 05/20/2020   Procedure: LEFT HEART CATH AND CORONARY ANGIOGRAPHY;  Surgeon: Kathleene Hazel, MD;  Location: [facility];  Service: Cardiovascular;  Laterality: N/A;   TRIGGER FINGER RELEASE Right    thumb    FAMILY HISTORY Family History  Problem Relation Age of Onset   High blood pressure Mother    Diabetes Mother    Diabetes Maternal Grandmother    High blood pressure Maternal Grandmother    Heart attack Maternal Grandfather    Diabetes Maternal Grandfather    High blood pressure Maternal Grandfather    High blood pressure Paternal Grandmother    High blood pressure Paternal Grandfather    Diabetes Maternal Aunt    Diabetes Maternal Uncle    Skin cancer Maternal Uncle     SOCIAL HISTORY Social History   Tobacco Use   Smoking status: Never   Smokeless tobacco: Never  Vaping Use   Vaping Use: Never used  Substance Use Topics   Alcohol use: No   Drug use: No         OPHTHALMIC EXAM:  Base Eye Exam     Visual Acuity (Snellen - Linear)       Right Left   Dist [place] 20/25 - 20/25 -   Dist ph [place] 20/20 - 20/20 -          Tonometry (Tonopen, 9:15 AM)       Right Left   Pressure 15 15         Pupils       Dark Light Shape React APD   Right 4 3 Round Brisk None   Left 4 3 Round Brisk None         Visual Fields (Counting fingers)       Left Right    Full Full         Extraocular Movement       Right  Left    Full, Ortho Full, Ortho         Neuro/Psych     Oriented x3: Yes   Mood/Affect: Normal         Dilation     Both eyes: 1.0% Mydriacyl, 2.5% Phenylephrine @ 9:15 AM           Slit Lamp and Fundus Exam     Slit Lamp Exam       Right Left   Lids/Lashes Dermatochalasis - upper lid, Meibomian gland dysfunction, mild Telangiectasia Dermatochalasis - upper lid, Meibomian gland dysfunction, mild Telangiectasia   Conjunctiva/Sclera White and quiet White and quiet   Cornea Trace inferior Punctate epithelial erosions, tear film debris Trace inferior Punctate epithelial erosions, tear film debris   Anterior Chamber deep, clear, narrow temporal angle deep, clear, narrow temporal angle   Iris Round and dilated, No NVI Round and dilated, No NVI   Lens Cortical spokes Cortical spokes   Vitreous trace Vitreous syneresis trace Vitreous syneresis         Fundus Exam       Right Left   Disc Pink and Sharp, Compact Pink and Sharp, Compact   C/D Ratio 0.2 0.2   Macula Flat, good foveal reflex, scattered MA/DBH greatest temporal macula, trace cystic changes, mild focal laser changes Blunted foveal reflex, scattered IRH/DBH, +edema temporal macula -- persistent   Vessels attenuated, mild Tortuocity  early NV nasal to disc - regressing, attenuated, mild tortuousity   Periphery Attached, scattered MA/DBH greatest posteriorly    Attached, scattered MA/DBH greatest posteriorly and nasally, good early 360 PRP changes            IMAGING AND PROCEDURES  Imaging and Procedures for 06/09/2021  OCT, Retina - OU - Both Eyes       Right Eye Quality was good. Central Foveal  Thickness: 221. Progression has been stable. Findings include normal foveal contour, intraretinal fluid, no SRF, intraretinal hyper-reflective material (Mild persistent cystic changes greatest superior and temporal macula ).   Left Eye Quality was good. Central Foveal Thickness: 230. Progression has improved. Findings include intraretinal fluid, no SRF, abnormal foveal contour (persistent IRF/edema temporal macula - minimal improvement).   Notes *Images captured and stored on drive  Diagnosis / Impression:  DME OU (OS>OD) OD: Mild persistent cystic changes greatest superior and temporal macula  OS: persistent IRF/edema temporal macula - minimal improvement   Clinical management:  See below  Abbreviations: NFP - Normal foveal profile. CME - cystoid macular edema. PED - pigment epithelial detachment. IRF - intraretinal fluid. SRF - subretinal fluid. EZ - ellipsoid zone. ERM - epiretinal membrane. ORA - outer retinal atrophy. ORT - outer retinal tubulation. SRHM - subretinal hyper-reflective material. IRHM - intraretinal hyper-reflective material      Intravitreal Injection, Pharmacologic Agent - OS - Left Eye       Time Out 06/09/2021. 9:53 AM. Confirmed correct patient, procedure, site, and patient consented.   Anesthesia Topical anesthesia was used. Anesthetic medications included Lidocaine 2%, Proparacaine 0.5%.   Procedure Preparation included 5% betadine to ocular surface, eyelid speculum. A (32g) needle was used.   Injection: 1.25 mg Bevacizumab 1.25mg /0.5ml   Route: Intravitreal, Site: Left Eye   NDC: [id number], Lot: [id number], Expiration date: 07/17/2021, Waste: 0.05 mL   Post-op Post injection exam found visual acuity of at least counting fingers. The patient tolerated the procedure well. There were no complications. The patient received written and verbal post procedure care education. Post injection medications were  not given.            ASSESSMENT/PLAN:     ICD-10-CM   1. Proliferative diabetic retinopathy of left eye with macular edema associated with type 2 diabetes mellitus (HCC)  B71.6967 Intravitreal Injection, Pharmacologic Agent - OS - Left Eye    Bevacizumab (AVASTIN) SOLN 1.25 mg    2. Retinal edema  H35.81 OCT, Retina - OU - Both Eyes    3. Moderate nonproliferative diabetic retinopathy of right eye with macular edema associated with type 2 diabetes mellitus (HCC)  E11.3311     4. Essential hypertension  I10     5. Hypertensive retinopathy of both eyes  H35.033      1,2. Proliferative diabetic retinopathy w/ DME, OS  - s/p IVA OS #1 (02.02.22), #2 (03.04.22), #3 (04.01.22), #4 (05.12.22), #5 (06.09.22), #6 (07.21.22), #7 (08.18.22)  - s/p PRP OS (02.18.22) - BCVA 20/20 OS (improved) - FA (02.02.22) shows no NV OS  - OCT shows OS: persistent IRF/edema temporal macula - minimal improvement - recommend IVA OS #8 today, 09.15.22 for DME with interval at 4 weeks - pt wishes to proceed - RBA of procedure discussed, questions answered - IVA informed consent obtained and signed, 02.02.22 (OS) - see procedure note - f/u 4 weeks DFE, OCT, possible injection vs focal laser OS  3. Severe Non-proliferative diabetic retinopathy, OD - FA (02.02.22) shows focal NV nasal periphery OD - s/p Focal laser OD (05.27.22) for DME - BCVA 20/20 OD - OCT shows OD: Interval improvement in cystic changes greatest superior and temporal macula  - exam shows scattered IRH - f/u 4 weeks, DFE, OCT  4,5. Hypertensive retinopathy OU - discussed importance of tight BP control - history of MI 04/2020 s/p stents - monitor   Ophthalmic Meds Ordered this visit:  Meds ordered this encounter  Medications   Bevacizumab (AVASTIN) SOLN 1.25 mg        Return in about 4 weeks (around 07/07/2021) for f/u PDR OU, DFE, OCT.  There are no Patient Instructions on file for this visit.   Explained the diagnoses, plan, and follow up with the patient and they  expressed understanding.  Patient expressed understanding of the importance of proper follow up care.   This document serves as a record of services personally performed by Karie Chimera, MD, PhD. It was created on their behalf by Glee Arvin. Manson Passey, OA an ophthalmic technician. The creation of this record is the provider's dictation and/or activities during the visit.    Electronically signed by: Glee Arvin. Kristopher Oppenheim 09.14.2022 10:47 PM   Karie Chimera, M.D., Ph.D. Diseases & Surgery of the Retina and Vitreous [facility]  I have reviewed the above documentation for accuracy and completeness, and I agree with the above. Karie Chimera, M.D., Ph.D. 06/10/21 10:52 PM   Abbreviations: M myopia (nearsighted); A astigmatism; H hyperopia (farsighted); P presbyopia; Mrx spectacle prescription;  CTL contact lenses; OD right eye; OS left eye; OU both eyes  XT exotropia; ET esotropia; PEK punctate epithelial keratitis; PEE punctate epithelial erosions; DES dry eye syndrome; MGD meibomian gland dysfunction; ATs artificial tears; PFAT's preservative free artificial tears; NSC nuclear sclerotic cataract; PSC posterior subcapsular cataract; ERM epi-retinal membrane; PVD posterior vitreous detachment; RD retinal detachment; DM diabetes mellitus; DR diabetic retinopathy; NPDR non-proliferative diabetic retinopathy; PDR proliferative diabetic retinopathy; CSME clinically significant macular edema; DME diabetic macular edema; dbh dot blot hemorrhages; CWS cotton wool spot; POAG primary open angle  glaucoma; C/D cup-to-disc ratio; HVF humphrey visual field; GVF goldmann visual field; OCT optical coherence tomography; IOP intraocular pressure; BRVO Branch retinal vein occlusion; CRVO central retinal vein occlusion; CRAO central retinal artery occlusion; BRAO branch retinal artery occlusion; RT retinal tear; SB scleral buckle; PPV pars plana vitrectomy; VH Vitreous hemorrhage; PRP panretinal laser  photocoagulation; IVK intravitreal kenalog; VMT vitreomacular traction; MH Macular hole;  NVD neovascularization of the disc; NVE neovascularization elsewhere; AREDS age related eye disease study; ARMD age related macular degeneration; POAG primary open angle glaucoma; EBMD epithelial/anterior basement membrane dystrophy; ACIOL anterior chamber intraocular lens; IOL intraocular lens; PCIOL posterior chamber intraocular lens; Phaco/IOL phacoemulsification with intraocular lens placement; [place] photorefractive keratectomy; LASIK laser assisted in situ keratomileusis; HTN hypertension; DM diabetes mellitus; COPD chronic obstructive pulmonary disease

## 2021-06-09 ENCOUNTER — Ambulatory Visit (INDEPENDENT_AMBULATORY_CARE_PROVIDER_SITE_OTHER): Payer: BC Managed Care – PPO | Admitting: Ophthalmology

## 2021-06-09 ENCOUNTER — Other Ambulatory Visit: Payer: Self-pay

## 2021-06-09 ENCOUNTER — Encounter (INDEPENDENT_AMBULATORY_CARE_PROVIDER_SITE_OTHER): Payer: Self-pay | Admitting: Ophthalmology

## 2021-06-09 DIAGNOSIS — H3581 Retinal edema: Secondary | ICD-10-CM

## 2021-06-09 DIAGNOSIS — H35033 Hypertensive retinopathy, bilateral: Secondary | ICD-10-CM

## 2021-06-09 DIAGNOSIS — E113311 Type 2 diabetes mellitus with moderate nonproliferative diabetic retinopathy with macular edema, right eye: Secondary | ICD-10-CM

## 2021-06-09 DIAGNOSIS — E113512 Type 2 diabetes mellitus with proliferative diabetic retinopathy with macular edema, left eye: Secondary | ICD-10-CM

## 2021-06-09 DIAGNOSIS — I10 Essential (primary) hypertension: Secondary | ICD-10-CM | POA: Diagnosis not present

## 2021-06-10 ENCOUNTER — Encounter (INDEPENDENT_AMBULATORY_CARE_PROVIDER_SITE_OTHER): Payer: Self-pay | Admitting: Ophthalmology

## 2021-06-10 MED ORDER — BEVACIZUMAB CHEMO INJECTION 1.25MG/0.05ML SYRINGE FOR KALEIDOSCOPE
1.2500 mg | INTRAVITREAL | Status: AC | PRN
  Administered 2021-06-09: 1.25 mg via INTRAVITREAL

## 2021-06-13 ENCOUNTER — Other Ambulatory Visit: Payer: Self-pay | Admitting: Internal Medicine

## 2021-06-15 ENCOUNTER — Other Ambulatory Visit: Payer: Self-pay | Admitting: Internal Medicine

## 2021-06-15 MED ORDER — TICAGRELOR 90 MG PO TABS: 90.0000 mg | ORAL_TABLET | Freq: Two times a day (BID) | ORAL | 9 refills | Status: DC

## 2021-06-17 ENCOUNTER — Telehealth: Payer: Self-pay | Admitting: General Practice

## 2021-06-17 NOTE — Telephone Encounter (Signed)
Transition Care Management Unsuccessful Follow-up Telephone Call  Date of discharge and from where:  06/16/21 from [facility]  Attempts:  1st Attempt  Reason for unsuccessful TCM follow-up call:  Left voice message

## 2021-06-20 NOTE — Telephone Encounter (Signed)
Transition Care Management Unsuccessful Follow-up Telephone Call  Date of discharge and from where:  06/16/21 from [facility]  Attempts:  2nd Attempt  Reason for unsuccessful TCM follow-up call:  Left voice message

## 2021-06-21 ENCOUNTER — Emergency Department (INDEPENDENT_AMBULATORY_CARE_PROVIDER_SITE_OTHER)
Admission: EM | Admit: 2021-06-21 | Discharge: 2021-06-21 | Disposition: A | Discharge status: Home / Self Care | Payer: BC Managed Care – PPO | Source: Home / Self Care | Attending: Family Medicine | Admitting: Family Medicine

## 2021-06-21 ENCOUNTER — Other Ambulatory Visit: Payer: Self-pay

## 2021-06-21 DIAGNOSIS — M25551 Pain in right hip: Secondary | ICD-10-CM | POA: Diagnosis not present

## 2021-06-21 DIAGNOSIS — Z8619 Personal history of other infectious and parasitic diseases: Secondary | ICD-10-CM | POA: Diagnosis not present

## 2021-06-21 MED ORDER — VALACYCLOVIR HCL 1 G PO TABS: 1000.0000 mg | ORAL_TABLET | Freq: Three times a day (TID) | ORAL | 0 refills | Status: DC

## 2021-06-21 MED ORDER — TRAMADOL-ACETAMINOPHEN 37.5-325 MG PO TABS: 1.0000 | ORAL_TABLET | Freq: Four times a day (QID) | ORAL | 0 refills | Status: DC | PRN

## 2021-06-21 NOTE — Discharge Instructions (Addendum)
Take the valacyclovir 3 x a day Increase the gabapentin to 3 x a day Take ultracet for pain See your PCP in follow up

## 2021-06-21 NOTE — ED Triage Notes (Signed)
Pt presents with concern for shingles. Pt states she has pain without a rash similar to her last shingles outbreak. Pt states pain has been present for 2-4 days

## 2021-06-21 NOTE — ED Provider Notes (Signed)
[facility]    CSN: [id number] Arrival date & time: 06/21/21  1000      History   Chief Complaint Chief Complaint  Patient presents with   Hip Pain    Left hip, concerned for shingles    HPI Sarah Kane is a 50 y.o. female.   HPI  [name] has had shingles twice.  She fears she is getting again.  She has a deep burning pain around her lower trunk to her hip region on the left.  It is localized.  It feels like the shingles did prior to the rash breakout.  It started yesterday.  She is here requesting treatment.  She agrees to get shingles vaccination after this episode.  Past Medical History:  Diagnosis Date   Arthritis    COVID-19 05/2020   Diabetes mellitus without complication (HCC)    Diabetic retinopathy (HCC)    Hyperlipidemia    Hypertension    Hypertensive retinopathy    Myocardial infarct, old     Patient Active Problem List   Diagnosis Date Noted   Bilateral shoulder pain 09/08/2020   Unstable angina [facility])    Hospital discharge follow-up 06/10/2020   Anxiety with depression 06/10/2020   Dehydration 06/10/2020   Hyperlipidemia LDL goal <70 05/22/2020   CAD (coronary artery disease) 05/22/2020   Ischemic cardiomyopathy 05/22/2020   NSTEMI (non-ST elevated myocardial infarction) (HCC) 05/20/2020   Type 2 diabetes mellitus with hyperglycemia, without long-term current use of insulin (HCC) 10/23/2019   Pain of skin 08/23/2019   Essential hypertension 10/29/2018   Tear of left supraspinatus tendon 08/14/2018   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   Dupuytren's contracture of both hands 07/26/2018   Chronic pain 06/28/2018   ANA positive 06/28/2018   Arthritis of left acromioclavicular joint 10/03/2017   Subacromial bursitis 10/03/2017   Carpal tunnel syndrome 03/06/2017   Right hand pain 10/02/2016   Complex regional pain syndrome 06/16/2016   Trigger finger of right thumb 03/03/2016   Joint synovitis 02/07/2016    Past Surgical  History:  Procedure Laterality Date   CARDIAC CATHETERIZATION     CESAREAN SECTION  1990   CORONARY STENT INTERVENTION N/A 05/20/2020   Procedure: CORONARY STENT INTERVENTION;  Surgeon: Kathleene Hazel, MD;  Location: [facility];  Service: Cardiovascular;  Laterality: N/A;   CORONARY STENT INTERVENTION N/A 07/22/2020   Procedure: CORONARY STENT INTERVENTION;  Surgeon: Kathleene Hazel, MD;  Location: [facility];  Service: Cardiovascular;  Laterality: N/A;   LEFT HEART CATH AND CORONARY ANGIOGRAPHY N/A 05/20/2020   Procedure: LEFT HEART CATH AND CORONARY ANGIOGRAPHY;  Surgeon: Kathleene Hazel, MD;  Location: [facility];  Service: Cardiovascular;  Laterality: N/A;   TRIGGER FINGER RELEASE Right    thumb    OB History   No obstetric history on file.      Home Medications    Prior to Admission medications   Medication Sig Start Date End Date Taking? Authorizing Provider  traMADol-acetaminophen (ULTRACET) 37.5-325 MG tablet Take 1-2 tablets by mouth every 6 (six) hours as needed. 06/21/21  Yes Eustace Moore, MD  valACYclovir (VALTREX) 1000 MG tablet Take 1 tablet (1,000 mg total) by mouth 3 (three) times daily. 06/21/21  Yes Eustace Moore, MD  acetaminophen (TYLENOL) 500 MG tablet Take 1,000 mg by mouth every 6 (six) hours as needed for mild pain or headache.     [provider]  aspirin 81 MG EC tablet  Take 1 tablet (81 mg total) by mouth daily. Swallow whole. 06/04/20   Pricilla Riffle, MD  atorvastatin (LIPITOR) 80 MG tablet Take 1 tablet by mouth once daily 06/13/21   Pricilla Riffle, MD  escitalopram (LEXAPRO) 20 MG tablet Take 1 tablet by mouth once daily 03/15/21   Sunnie Nielsen, DO  gabapentin (NEURONTIN) 300 MG capsule TAKE 1 CAPSULE BY MOUTH THREE TIMES DAILY 07/09/20   Sunnie Nielsen, DO  glimepiride (AMARYL) 4 MG tablet TAKE 2 TABLETS BY MOUTH ONCE DAILY WITH BREAKFAST. Needs appointment. 05/11/21   Sunnie Nielsen, DO  isosorbide mononitrate (IMDUR) 30 MG 24 hr tablet TAKE 1 & 1/2 (ONE & ONE-HALF) TABLETS BY MOUTH ONCE DAILY 05/18/21   Georgie Chard D, NP  metoprolol succinate (TOPROL-XL) 25 MG 24 hr tablet Take 1 tablet (25 mg total) by mouth 2 (two) times daily with a meal. 06/04/20   Pricilla Riffle, MD  Multiple Vitamins-Minerals (MULTIVITAMIN WOMEN PO) Take 1 tablet by mouth 3 (three) times a week.     [provider]  OZEMPIC, 1 MG/DOSE, 4 MG/3ML SOPN INJECT 1MG  INTO THE SKIN ONCE A WEEK 11/29/20   01/29/21, DO  prazosin (MINIPRESS) 2 MG capsule Take 1 capsule (2 mg total) by mouth at bedtime. 11/25/20   01/25/21, DO  sitaGLIPtin (JANUVIA) 100 MG tablet Take 1 tablet (100 mg total) by mouth daily. 11/25/20   01/25/21, DO  ticagrelor (BRILINTA) 90 MG TABS tablet Take 1 tablet (90 mg total) by mouth 2 (two) times daily. 06/15/21   06/17/21, MD  Vitamin D, Ergocalciferol, (DRISDOL) 1.25 MG (50000 UNIT) CAPS capsule Take 1 capsule by mouth once a week 07/09/20   07/11/20, DO    Family History Family History  Problem Relation Age of Onset   High blood pressure Mother    Diabetes Mother    Diabetes Maternal Grandmother    High blood pressure Maternal Grandmother    Heart attack Maternal Grandfather    Diabetes Maternal Grandfather    High blood pressure Maternal Grandfather    High blood pressure Paternal Grandmother    High blood pressure Paternal Grandfather    Diabetes Maternal Aunt    Diabetes Maternal Uncle    Skin cancer Maternal Uncle     Social History Social History   Tobacco Use   Smoking status: Never   Smokeless tobacco: Never  Vaping Use   Vaping Use: Never used  Substance Use Topics   Alcohol use: No   Drug use: No     Allergies   Nitroglycerin   Review of Systems Review of Systems See HPI  Physical Exam Triage Vital Signs ED Triage Vitals  Enc Vitals Group     BP 06/21/21 1009 127/85     Pulse Rate  06/21/21 1009 86     Resp 06/21/21 1009 12     Temp 06/21/21 1009 98.2 F (36.8 C)     Temp Source 06/21/21 1009 Oral     SpO2 06/21/21 1009 99 %     Weight --      Height --      Head Circumference --      Peak Flow --      Pain Score 06/21/21 1011 10     Pain Loc --      Pain Edu? --      Excl. in GC? --    No data found.  Updated Vital Signs BP 127/85 (BP Location:  Left Arm)   Pulse 86   Temp 98.2 F (36.8 C) (Oral)   Resp 12   SpO2 99%       Physical Exam Constitutional:      General: She is not in acute distress.    Appearance: She is well-developed.     Comments: Appears uncomfortable  HENT:     Head: Normocephalic and atraumatic.     Mouth/Throat:     Comments: Is wearing mask Eyes:     Conjunctiva/sclera: Conjunctivae normal.     Pupils: Pupils are equal, round, and reactive to light.  Cardiovascular:     Rate and Rhythm: Normal rate.  Pulmonary:     Effort: Pulmonary effort is normal. No respiratory distress.  Abdominal:     General: There is no distension.     Palpations: Abdomen is soft.  Musculoskeletal:        General: Normal range of motion.     Cervical back: Normal range of motion.  Skin:    General: Skin is warm and dry.     Comments: Skin is clear in area described  Neurological:     Mental Status: She is alert.  Psychiatric:        Mood and Affect: Mood normal.        Behavior: Behavior normal.     UC Treatments / Results  Labs (all labs ordered are listed, but only abnormal results are displayed) Labs Reviewed - No data to display  EKG   Radiology No results found.  Procedures Procedures (including critical care time)  Medications Ordered in UC Medications - No data to display  Initial Impression / Assessment and Plan / UC Course  I have reviewed the triage vital signs and the nursing notes.  Pertinent labs & imaging results that were available during my care of the patient were reviewed by me and considered in my  medical decision making (see chart for details). By history this may be another shingles outbreak.  It would be unusual to have shingles in 3 different dermatomes, but this is what she describes.will treat with valcyclovir. Ultracet for pain.  Is already taking gabapentin 2 x a day - would increase.  Follow up with PCP     Final Clinical Impressions(s) / UC Diagnoses   Final diagnoses:  Right hip pain  History of shingles     Discharge Instructions      Take the valacyclovir 3 x a day Increase the gabapentin to 3 x a day Take ultracet for pain See your PCP in follow up   ED Prescriptions     Medication Sig Dispense Auth. Provider   valACYclovir (VALTREX) 1000 MG tablet Take 1 tablet (1,000 mg total) by mouth 3 (three) times daily. 21 tablet Eustace Moore, MD   traMADol-acetaminophen (ULTRACET) 37.5-325 MG tablet Take 1-2 tablets by mouth every 6 (six) hours as needed. 20 tablet Eustace Moore, MD      I have reviewed the PDMP during this encounter.   Eustace Moore, MD 06/21/21 [phone]

## 2021-06-22 NOTE — Telephone Encounter (Signed)
Transition Care Management Unsuccessful Follow-up Telephone Call  Date of discharge and from where:  06/16/21 from [facility]  Attempts:  3rd Attempt  Reason for unsuccessful TCM follow-up call:  Left voice message

## 2021-06-27 ENCOUNTER — Emergency Department (HOSPITAL_BASED_OUTPATIENT_CLINIC_OR_DEPARTMENT_OTHER)
Admission: EM | Admit: 2021-06-27 | Discharge: 2021-06-27 | Disposition: A | Discharge status: Home / Self Care | Payer: BC Managed Care – PPO | Attending: Emergency Medicine | Admitting: Emergency Medicine

## 2021-06-27 ENCOUNTER — Telehealth: Payer: Self-pay | Admitting: Physician Assistant

## 2021-06-27 ENCOUNTER — Emergency Department: Admission: EM | Admit: 2021-06-27 | Discharge: 2021-06-27 | Discharge status: Against Medical Advice | Payer: Self-pay

## 2021-06-27 ENCOUNTER — Emergency Department (HOSPITAL_BASED_OUTPATIENT_CLINIC_OR_DEPARTMENT_OTHER): Payer: BC Managed Care – PPO

## 2021-06-27 ENCOUNTER — Encounter (HOSPITAL_BASED_OUTPATIENT_CLINIC_OR_DEPARTMENT_OTHER): Payer: Self-pay | Admitting: Emergency Medicine

## 2021-06-27 ENCOUNTER — Other Ambulatory Visit: Payer: Self-pay

## 2021-06-27 DIAGNOSIS — M5459 Other low back pain: Secondary | ICD-10-CM | POA: Diagnosis not present

## 2021-06-27 DIAGNOSIS — R0602 Shortness of breath: Secondary | ICD-10-CM | POA: Diagnosis not present

## 2021-06-27 DIAGNOSIS — I10 Essential (primary) hypertension: Secondary | ICD-10-CM | POA: Diagnosis not present

## 2021-06-27 DIAGNOSIS — Z7982 Long term (current) use of aspirin: Secondary | ICD-10-CM | POA: Diagnosis not present

## 2021-06-27 DIAGNOSIS — E11319 Type 2 diabetes mellitus with unspecified diabetic retinopathy without macular edema: Secondary | ICD-10-CM | POA: Diagnosis not present

## 2021-06-27 DIAGNOSIS — I2510 Atherosclerotic heart disease of native coronary artery without angina pectoris: Secondary | ICD-10-CM | POA: Insufficient documentation

## 2021-06-27 DIAGNOSIS — M25552 Pain in left hip: Secondary | ICD-10-CM | POA: Insufficient documentation

## 2021-06-27 DIAGNOSIS — M79605 Pain in left leg: Secondary | ICD-10-CM | POA: Diagnosis not present

## 2021-06-27 DIAGNOSIS — Z8616 Personal history of COVID-19: Secondary | ICD-10-CM | POA: Diagnosis not present

## 2021-06-27 DIAGNOSIS — Z20822 Contact with and (suspected) exposure to covid-19: Secondary | ICD-10-CM | POA: Insufficient documentation

## 2021-06-27 DIAGNOSIS — R0789 Other chest pain: Secondary | ICD-10-CM

## 2021-06-27 DIAGNOSIS — Z7984 Long term (current) use of oral hypoglycemic drugs: Secondary | ICD-10-CM | POA: Insufficient documentation

## 2021-06-27 DIAGNOSIS — K449 Diaphragmatic hernia without obstruction or gangrene: Secondary | ICD-10-CM | POA: Diagnosis not present

## 2021-06-27 DIAGNOSIS — Z79899 Other long term (current) drug therapy: Secondary | ICD-10-CM | POA: Diagnosis not present

## 2021-06-27 DIAGNOSIS — M545 Low back pain, unspecified: Secondary | ICD-10-CM | POA: Insufficient documentation

## 2021-06-27 DIAGNOSIS — Z955 Presence of coronary angioplasty implant and graft: Secondary | ICD-10-CM | POA: Insufficient documentation

## 2021-06-27 DIAGNOSIS — J9811 Atelectasis: Secondary | ICD-10-CM | POA: Diagnosis not present

## 2021-06-27 DIAGNOSIS — R079 Chest pain, unspecified: Secondary | ICD-10-CM | POA: Diagnosis not present

## 2021-06-27 HISTORY — DX: Atherosclerotic heart disease of native coronary artery without angina pectoris: I25.10

## 2021-06-27 LAB — TROPONIN I (HIGH SENSITIVITY): Troponin I (High Sensitivity): <2 ng/L (ref <18)

## 2021-06-27 LAB — CBC
HCT: 44.6 % (ref 36.0–46.0)
Hemoglobin: 14.9 g/dL (ref 12.0–15.0)
MCH: 29.8 pg (ref 26.0–34.0)
MCHC: 33.4 g/dL (ref 30.0–36.0)
MCV: 89.2 fL (ref 80.0–100.0)
Platelets: 230 10*3/uL (ref 150–400)
RBC: 5 MIL/uL (ref 3.87–5.11)
RDW: 13.1 % (ref 11.5–15.5)
WBC: 7.9 10*3/uL (ref 4.0–10.5)
nRBC: 0 % (ref 0.0–0.2)

## 2021-06-27 LAB — BASIC METABOLIC PANEL
Anion gap: 11 (ref 5–15)
BUN: 16 mg/dL (ref 6–20)
CO2: 26 mmol/L (ref 22–32)
Calcium: 9.5 mg/dL (ref 8.9–10.3)
Chloride: 100 mmol/L (ref 98–111)
Creatinine, Ser: 0.92 mg/dL (ref 0.44–1.00)
GFR, Estimated: >60 mL/min (ref >60)
Glucose, Bld: 176 mg/dL — ABNORMAL HIGH (ref 70–99)
Potassium: 3.4 mmol/L — ABNORMAL LOW (ref 3.5–5.1)
Sodium: 137 mmol/L (ref 135–145)

## 2021-06-27 LAB — RESP PANEL BY RT-PCR (FLU A&B, COVID) ARPGX2
Influenza A by PCR: NEGATIVE
Influenza B by PCR: NEGATIVE
SARS Coronavirus 2 by RT PCR: NEGATIVE

## 2021-06-27 LAB — D-DIMER, QUANTITATIVE: D-Dimer, Quant: 1.8 ug/mL-FEU — ABNORMAL HIGH (ref 0.00–0.50)

## 2021-06-27 MED ORDER — IOHEXOL 350 MG/ML SOLN
100.0000 mL | Freq: Once | INTRAVENOUS | Status: AC | PRN
  Administered 2021-06-27: 100 mL via INTRAVENOUS

## 2021-06-27 MED ORDER — KETOROLAC TROMETHAMINE 30 MG/ML IJ SOLN
30.0000 mg | Freq: Once | INTRAMUSCULAR | Status: AC
  Administered 2021-06-27: 30 mg via INTRAVENOUS

## 2021-06-27 MED ORDER — LIDOCAINE 5 % EX PTCH
1.0000 | MEDICATED_PATCH | CUTANEOUS | Status: DC
  Administered 2021-06-27: 1 via TRANSDERMAL
  Filled 2021-06-27: qty 1

## 2021-06-27 MED ORDER — HYDROCODONE-ACETAMINOPHEN 5-325 MG PO TABS: 1.0000 | ORAL_TABLET | ORAL | 0 refills | Status: DC | PRN

## 2021-06-27 MED ORDER — KETOROLAC TROMETHAMINE 30 MG/ML IJ SOLN
30.0000 mg | Freq: Once | INTRAMUSCULAR | Status: DC
  Filled 2021-06-27: qty 1

## 2021-06-27 NOTE — Telephone Encounter (Signed)
I called patient phone rings one time then goes busy if she comes in tomorrow morning I will let her know she needs to be seen before getting medication. - CF

## 2021-06-27 NOTE — ED Provider Notes (Signed)
[facility] EMERGENCY DEPARTMENT Provider Note   CSN: [id number] Arrival date & time: 06/27/21  0865     History Chief Complaint  Patient presents with   Chest Pain   Back Pain    Sarah Kane is a 50 y.o. female.  Pt presents to the ED today with left leg and hip pain .  She said she has had shingles a few times and this is the way she feels before she gets the shingles.  She did go to UC on 9/27 and was put on Valtrex and Ultracet.  Pt did not have the rash then, but they treated her because she said this was the way she feels before the shingles.  She went back to that urgent care today asking for more pain medicine and they told her she needed to see her pcp.  She went to her pcp's office only to find her doctor is no longer in that practice and they did not have any appointments today.  So, she came here.  She also reports some cp and sob, but she thinks that is from the pain in her back and leg.  She denies f/c.  No n/v.  No f/c.      Past Medical History:  Diagnosis Date   Arthritis    Coronary artery disease    COVID-19 05/2020   Diabetes mellitus without complication (HCC)    Diabetic retinopathy (HCC)    Hyperlipidemia    Hypertension    Hypertensive retinopathy    Myocardial infarct, old     Patient Active Problem List   Diagnosis Date Noted   Bilateral shoulder pain 09/08/2020   Unstable angina [facility])    Hospital discharge follow-up 06/10/2020   Anxiety with depression 06/10/2020   Dehydration 06/10/2020   Hyperlipidemia LDL goal <70 05/22/2020   CAD (coronary artery disease) 05/22/2020   Ischemic cardiomyopathy 05/22/2020   NSTEMI (non-ST elevated myocardial infarction) (HCC) 05/20/2020   Type 2 diabetes mellitus with hyperglycemia, without long-term current use of insulin (HCC) 10/23/2019   Pain of skin 08/23/2019   Essential hypertension 10/29/2018   Tear of left supraspinatus tendon 08/14/2018   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    Dupuytren's contracture of both hands 07/26/2018   Chronic pain 06/28/2018   ANA positive 06/28/2018   Arthritis of left acromioclavicular joint 10/03/2017   Subacromial bursitis 10/03/2017   Carpal tunnel syndrome 03/06/2017   Right hand pain 10/02/2016   Complex regional pain syndrome 06/16/2016   Trigger finger of right thumb 03/03/2016   Joint synovitis 02/07/2016    Past Surgical History:  Procedure Laterality Date   CARDIAC CATHETERIZATION     CESAREAN SECTION  1990   CORONARY STENT INTERVENTION N/A 05/20/2020   Procedure: CORONARY STENT INTERVENTION;  Surgeon: Kathleene Hazel, MD;  Location: [facility] INVASIVE CV LAB;  Service: Cardiovascular;  Laterality: N/A;   CORONARY STENT INTERVENTION N/A 07/22/2020   Procedure: CORONARY STENT INTERVENTION;  Surgeon: Kathleene Hazel, MD;  Location: [facility] INVASIVE CV LAB;  Service: Cardiovascular;  Laterality: N/A;   LEFT HEART CATH AND CORONARY ANGIOGRAPHY N/A 05/20/2020   Procedure: LEFT HEART CATH AND CORONARY ANGIOGRAPHY;  Surgeon: Kathleene Hazel, MD;  Location: [facility] INVASIVE CV LAB;  Service: Cardiovascular;  Laterality: N/A;   TRIGGER FINGER RELEASE Right    thumb     OB History   No obstetric history on file.     Family History  Problem Relation Age of Onset  High blood pressure Mother    Diabetes Mother    Diabetes Maternal Grandmother    High blood pressure Maternal Grandmother    Heart attack Maternal Grandfather    Diabetes Maternal Grandfather    High blood pressure Maternal Grandfather    High blood pressure Paternal Grandmother    High blood pressure Paternal Grandfather    Diabetes Maternal Aunt    Diabetes Maternal Uncle    Skin cancer Maternal Uncle     Social History   Tobacco Use   Smoking status: Never   Smokeless tobacco: Never  Vaping Use   Vaping Use: Never used  Substance Use Topics   Alcohol use: No   Drug use: No    Home Medications Prior to Admission medications    Medication Sig Start Date End Date Taking? Authorizing Provider  HYDROcodone-acetaminophen (NORCO/VICODIN) 5-325 MG tablet Take 1 tablet by mouth every 4 (four) hours as needed. 06/27/21  Yes Jacalyn Lefevre, MD  acetaminophen (TYLENOL) 500 MG tablet Take 1,000 mg by mouth every 6 (six) hours as needed for mild pain or headache.     [provider]  aspirin 81 MG EC tablet Take 1 tablet (81 mg total) by mouth daily. Swallow whole. 06/04/20   Pricilla Riffle, MD  atorvastatin (LIPITOR) 80 MG tablet Take 1 tablet by mouth once daily 06/13/21   Pricilla Riffle, MD  escitalopram (LEXAPRO) 20 MG tablet Take 1 tablet by mouth once daily 03/15/21   Sunnie Nielsen, DO  gabapentin (NEURONTIN) 300 MG capsule TAKE 1 CAPSULE BY MOUTH THREE TIMES DAILY 07/09/20   Sunnie Nielsen, DO  glimepiride (AMARYL) 4 MG tablet TAKE 2 TABLETS BY MOUTH ONCE DAILY WITH BREAKFAST. Needs appointment. 05/11/21   Sunnie Nielsen, DO  isosorbide mononitrate (IMDUR) 30 MG 24 hr tablet TAKE 1 & 1/2 (ONE & ONE-HALF) TABLETS BY MOUTH ONCE DAILY 05/18/21   Georgie Chard D, NP  metoprolol succinate (TOPROL-XL) 25 MG 24 hr tablet Take 1 tablet (25 mg total) by mouth 2 (two) times daily with a meal. 06/04/20   Pricilla Riffle, MD  Multiple Vitamins-Minerals (MULTIVITAMIN WOMEN PO) Take 1 tablet by mouth 3 (three) times a week.     [provider]  OZEMPIC, 1 MG/DOSE, 4 MG/3ML SOPN INJECT 1MG  INTO THE SKIN ONCE A WEEK 11/29/20   01/29/21, DO  prazosin (MINIPRESS) 2 MG capsule Take 1 capsule (2 mg total) by mouth at bedtime. 11/25/20   01/25/21, DO  sitaGLIPtin (JANUVIA) 100 MG tablet Take 1 tablet (100 mg total) by mouth daily. 11/25/20   01/25/21, DO  ticagrelor (BRILINTA) 90 MG TABS tablet Take 1 tablet (90 mg total) by mouth 2 (two) times daily. 06/15/21   06/17/21, MD  traMADol-acetaminophen (ULTRACET) 37.5-325 MG tablet Take 1-2 tablets by mouth every 6 (six) hours as needed. 06/21/21    06/23/21, MD  valACYclovir (VALTREX) 1000 MG tablet Take 1 tablet (1,000 mg total) by mouth 3 (three) times daily. 06/21/21   06/23/21, MD  Vitamin D, Ergocalciferol, (DRISDOL) 1.25 MG (50000 UNIT) CAPS capsule Take 1 capsule by mouth once a week 07/09/20   07/11/20, DO    Allergies    Nitroglycerin  Review of Systems   Review of Systems  Respiratory:  Positive for shortness of breath.   Cardiovascular:  Positive for chest pain.  Musculoskeletal:  Positive for back pain.       Left hip pain  All other  systems reviewed and are negative.  Physical Exam Updated Vital Signs BP (!) 141/99 (BP Location: Right Arm)   Pulse 80   Temp 98.4 F (36.9 C) (Oral)   Resp 16   Ht 5' (1.524 m)   Wt 81.6 kg   SpO2 100%   BMI 35.15 kg/m   Physical Exam Vitals and nursing note reviewed.  Constitutional:      Appearance: She is well-developed.  HENT:     Head: Normocephalic and atraumatic.  Eyes:     Extraocular Movements: Extraocular movements intact.     Pupils: Pupils are equal, round, and reactive to light.  Cardiovascular:     Rate and Rhythm: Normal rate and regular rhythm.     Heart sounds: Normal heart sounds.  Pulmonary:     Effort: Pulmonary effort is normal.     Breath sounds: Normal breath sounds.  Abdominal:     General: Bowel sounds are normal.     Palpations: Abdomen is soft.  Musculoskeletal:        General: Normal range of motion.     Cervical back: Normal range of motion and neck supple.  Skin:    General: Skin is warm.     Capillary Refill: Capillary refill takes less than 2 seconds.     Findings: No rash.  Neurological:     General: No focal deficit present.     Mental Status: She is alert and oriented to person, place, and time.  Psychiatric:        Mood and Affect: Mood normal.        Behavior: Behavior normal.    ED Results / Procedures / Treatments   Labs (all labs ordered are listed, but only abnormal results are  displayed) Labs Reviewed  BASIC METABOLIC PANEL - Abnormal; Notable for the following components:      Result Value   Potassium 3.4 (*)    Glucose, Bld 176 (*)    All other components within normal limits  D-DIMER, QUANTITATIVE - Abnormal; Notable for the following components:   D-Dimer, Quant 1.80 (*)    All other components within normal limits  RESP PANEL BY RT-PCR (FLU A&B, COVID) ARPGX2  CBC  URINALYSIS, ROUTINE W REFLEX MICROSCOPIC  TROPONIN I (HIGH SENSITIVITY)  TROPONIN I (HIGH SENSITIVITY)    EKG EKG Interpretation  Date/Time:  Monday June 27 2021 08:47:00 EDT Ventricular Rate:  91 PR Interval:  168 QRS Duration: 80 QT Interval:  376 QTC Calculation: 463 R Axis:   73 Text Interpretation: Sinus rhythm No significant change since last tracing Confirmed by Jacalyn Lefevre [phone]) on 06/27/2021 10:41:44 AM  Radiology CT Angio Chest PE W and/or Wo Contrast  Result Date: 06/27/2021 CLINICAL DATA:  Suspected pulmonary embolism in a 50 year old female. EXAM: CT ANGIOGRAPHY CHEST WITH CONTRAST TECHNIQUE: Multidetector CT imaging of the chest was performed using the standard protocol during bolus administration of intravenous contrast. Multiplanar CT image reconstructions and MIPs were obtained to evaluate the vascular anatomy. CONTRAST:  OMNIPAQUE IOHEXOL 350 MG/ML SOLN COMPARISON:  Comparison made with March 10, 2021. FINDINGS: Cardiovascular: Post percutaneous coronary intervention of LEFT coronary circulation as seen on prior imaging. Heart size mildly enlarged without substantial pericardial effusion. No evidence of pulmonary embolism. Mediastinum/Nodes: Normal appearance of thoracic inlet structures. No axillary lymphadenopathy. No mediastinal lymphadenopathy. No hilar lymphadenopathy. Small hiatal hernia. Mildly patulous esophagus. Lungs/Pleura: Basilar atelectasis. No effusion. No consolidative process. Airways are patent. Upper Abdomen: Smooth hepatic contour. No acute  process visualized  in the upper abdomen. Musculoskeletal: Spinal degenerative changes without acute or destructive bone process. Review of the MIP images confirms the above findings. IMPRESSION: Negative for pulmonary embolism. No acute pulmonary findings. Signs of prior percutaneous coronary intervention. Small hiatal hernia. Electronically Signed   By: Donzetta Kohut M.D.   On: 06/27/2021 11:14   DG Chest Port 1 View  Result Date: 06/27/2021 CLINICAL DATA:  Chest pain and back pain EXAM: PORTABLE CHEST 1 VIEW COMPARISON:  Chest radiograph 03/10/2021 FINDINGS: The cardiomediastinal silhouette is within normal limits. There is no focal consolidation or pulmonary edema. There is no pleural effusion or pneumothorax. There is no acute osseous abnormality. IMPRESSION: No radiographic evidence of acute cardiopulmonary process. Electronically Signed   By: Lesia Hausen M.D.   On: 06/27/2021 09:44    Procedures Procedures   Medications Ordered in ED Medications  ketorolac (TORADOL) 30 MG/ML injection 30 mg (has no administration in time range)  lidocaine (LIDODERM) 5 % 1 patch (1 patch Transdermal Patch Applied 06/27/21 0922)  ketorolac (TORADOL) 30 MG/ML injection 30 mg (30 mg Intravenous Given 06/27/21 0921)  iohexol (OMNIPAQUE) 350 MG/ML injection 100 mL (100 mLs Intravenous Contrast Given 06/27/21 1021)    ED Course  I have reviewed the triage vital signs and the nursing notes.  Pertinent labs & imaging results that were available during my care of the patient were reviewed by me and considered in my medical decision making (see chart for details).    MDM Rules/Calculators/A&P                           Cardiac work up negative.  Ddimer is elevated, but CT chest neg for PE.  Pt is convinced her pain is from a shingles outbreak about to happen.  Pt is told to continue Valtrex.  She is given a short course of Lortab.  She is to return if worse.  F/u with pcp. Final Clinical Impression(s) / ED  Diagnoses Final diagnoses:  Atypical chest pain  Acute left-sided low back pain without sciatica    Rx / DC Orders ED Discharge Orders          Ordered    HYDROcodone-acetaminophen (NORCO/VICODIN) 5-325 MG tablet  Every 4 hours PRN        06/27/21 1131             Jacalyn Lefevre, MD 06/27/21 1135

## 2021-06-27 NOTE — Telephone Encounter (Signed)
Patient walked in and was seen in Urgent Care for Shingles she states she is out of pain medication and was asking about getting a refill please advise. - CF

## 2021-06-27 NOTE — ED Triage Notes (Signed)
Pt having chest pain since last night.  Pt also c/o back pain radiating to left arm.  Pt states she "cant breathe".  Pt had stents placed one year ago.  Some nausea.  No fever.

## 2021-06-27 NOTE — Telephone Encounter (Signed)
Need appt at least virtual for refill for pain rx. I will work her in if needed.

## 2021-06-27 NOTE — ED Notes (Signed)
States she has shingles, points to area of left lower back over rt hip area, no redness or rash is currently noted at this time. Pain meds implemented as ordered

## 2021-07-01 ENCOUNTER — Other Ambulatory Visit: Payer: Self-pay

## 2021-07-01 ENCOUNTER — Ambulatory Visit (HOSPITAL_COMMUNITY): Payer: BC Managed Care – PPO | Attending: Cardiovascular Disease

## 2021-07-01 ENCOUNTER — Other Ambulatory Visit: Payer: Self-pay | Admitting: Internal Medicine

## 2021-07-01 DIAGNOSIS — R0989 Other specified symptoms and signs involving the circulatory and respiratory systems: Secondary | ICD-10-CM

## 2021-07-01 DIAGNOSIS — I2510 Atherosclerotic heart disease of native coronary artery without angina pectoris: Secondary | ICD-10-CM | POA: Insufficient documentation

## 2021-07-01 DIAGNOSIS — R252 Cramp and spasm: Secondary | ICD-10-CM

## 2021-07-01 LAB — ECHOCARDIOGRAM COMPLETE
Area-P 1/2: 4.39 cm2
S' Lateral: 3 cm

## 2021-07-01 MED ORDER — PERFLUTREN LIPID MICROSPHERE
1.0000 mL | INTRAVENOUS | Status: AC | PRN
  Administered 2021-07-01: 3 mL via INTRAVENOUS

## 2021-07-04 NOTE — Progress Notes (Signed)
[facility] - Clinic Note  07/07/2021     CHIEF COMPLAINT Patient presents for Retina Follow Up   HISTORY OF PRESENT ILLNESS: Sarah Kane is a 50 y.o. female who presents to the clinic today for:   HPI     Retina Follow Up   Patient presents with  Diabetic Retinopathy.  In left eye.  This started weeks ago.  Severity is moderate.  Duration of weeks.  Since onset it is stable.  I, the attending physician,  performed the HPI with the patient and updated documentation appropriately.        Comments   Pt states vision is the same OU.  Pt denies eye pain or discomfort.  Pt denies new or worsening floaters or fol OU.      Last edited by Rennis Chris, MD on 07/07/2021  9:35 PM.    Patient is having nightmares.  She is wanting to see a new doctor to inc Lexapro.   Referring physician: Aura Camps MD [address] [place], [place] [address]  HISTORICAL INFORMATION:  Selected notes from the MEDICAL RECORD NUMBER Referred by Dr. Karleen Hampshire for diabetic eye exam LEE: 12.278.21, BCVA OD: 20/50 OS: 20/70 Ocular Hx- DM, cataracts PMH- DM, cholesterol    CURRENT MEDICATIONS: No current outpatient medications on file. (Ophthalmic Drugs)   No current facility-administered medications for this visit. (Ophthalmic Drugs)   Current Outpatient Medications (Other)  Medication Sig   acetaminophen (TYLENOL) 500 MG tablet Take 1,000 mg by mouth every 6 (six) hours as needed for mild pain or headache.    aspirin 81 MG EC tablet Take 1 tablet (81 mg total) by mouth daily. Swallow whole.   atorvastatin (LIPITOR) 80 MG tablet Take 1 tablet by mouth once daily   escitalopram (LEXAPRO) 20 MG tablet Take 1 tablet by mouth once daily   gabapentin (NEURONTIN) 300 MG capsule TAKE 1 CAPSULE BY MOUTH THREE TIMES DAILY   glimepiride (AMARYL) 4 MG tablet TAKE 2 TABLETS BY MOUTH ONCE DAILY WITH BREAKFAST. Needs appointment.   HYDROcodone-acetaminophen (NORCO/VICODIN) 5-325 MG  tablet Take 1 tablet by mouth every 4 (four) hours as needed.   isosorbide mononitrate (IMDUR) 30 MG 24 hr tablet TAKE 1 & 1/2 (ONE & ONE-HALF) TABLETS BY MOUTH ONCE DAILY   metoprolol succinate (TOPROL-XL) 25 MG 24 hr tablet Take 1 tablet (25 mg total) by mouth 2 (two) times daily with a meal.   Multiple Vitamins-Minerals (MULTIVITAMIN WOMEN PO) Take 1 tablet by mouth 3 (three) times a week.    OZEMPIC, 1 MG/DOSE, 4 MG/3ML SOPN INJECT 1MG  INTO THE SKIN ONCE A WEEK   prazosin (MINIPRESS) 2 MG capsule Take 1 capsule (2 mg total) by mouth at bedtime.   sitaGLIPtin (JANUVIA) 100 MG tablet Take 1 tablet (100 mg total) by mouth daily.   ticagrelor (BRILINTA) 90 MG TABS tablet Take 1 tablet (90 mg total) by mouth 2 (two) times daily.   traMADol-acetaminophen (ULTRACET) 37.5-325 MG tablet Take 1-2 tablets by mouth every 6 (six) hours as needed.   valACYclovir (VALTREX) 1000 MG tablet Take 1 tablet (1,000 mg total) by mouth 3 (three) times daily.   Vitamin D, Ergocalciferol, (DRISDOL) 1.25 MG (50000 UNIT) CAPS capsule Take 1 capsule by mouth once a week   No current facility-administered medications for this visit. (Other)   REVIEW OF SYSTEMS: ROS   Positive for: Neurological, Musculoskeletal, Endocrine, Eyes Negative for: Constitutional, Gastrointestinal, Skin, Genitourinary, HENT, Cardiovascular, Respiratory, Psychiatric, Allergic/Imm, Heme/Lymph Last  edited by Corrinne Eagle on 07/07/2021  8:40 AM.    ALLERGIES Allergies  Allergen Reactions   Nitroglycerin     Blood pressure dropped, was hospitalized     PAST MEDICAL HISTORY Past Medical History:  Diagnosis Date   Arthritis    Coronary artery disease    COVID-19 05/2020   Diabetes mellitus without complication (HCC)    Diabetic retinopathy (HCC)    Hyperlipidemia    Hypertension    Hypertensive retinopathy    Myocardial infarct, old    Past Surgical History:  Procedure Laterality Date   CARDIAC CATHETERIZATION     CESAREAN  SECTION  1990   CORONARY STENT INTERVENTION N/A 05/20/2020   Procedure: CORONARY STENT INTERVENTION;  Surgeon: Kathleene Hazel, MD;  [facility];  Service: Cardiovascular;  Laterality: N/A;   CORONARY STENT INTERVENTION N/A 07/22/2020   Procedure: CORONARY STENT INTERVENTION;  Surgeon: Kathleene Hazel, MD;  [facility]: [facility];  Service: Cardiovascular;  Laterality: N/A;   LEFT HEART CATH AND CORONARY ANGIOGRAPHY N/A 05/20/2020   Procedure: LEFT HEART CATH AND CORONARY ANGIOGRAPHY;  Surgeon: Kathleene Hazel, MD;  [facility]: [facility];  Service: Cardiovascular;  Laterality: N/A;   TRIGGER FINGER RELEASE Right    thumb    FAMILY HISTORY Family History  Problem Relation Age of Onset   High blood pressure Mother    Diabetes Mother    Diabetes Maternal Grandmother    High blood pressure Maternal Grandmother    Heart attack Maternal Grandfather    Diabetes Maternal Grandfather    High blood pressure Maternal Grandfather    High blood pressure Paternal Grandmother    High blood pressure Paternal Grandfather    Diabetes Maternal Aunt    Diabetes Maternal Uncle    Skin cancer Maternal Uncle     SOCIAL HISTORY Social History   Tobacco Use   Smoking status: Never   Smokeless tobacco: Never  Vaping Use   Vaping Use: Never used  Substance Use Topics   Alcohol use: No   Drug use: No       OPHTHALMIC EXAM: Base Eye Exam     Visual Acuity (Snellen - Linear)       Right Left   Dist [place] 20/25 -1 20/25 -   Dist ph [place] 20/20 -2 20/20 -1         Tonometry (Tonopen, 8:49 AM)       Right Left   Pressure 15 14         Pupils       Dark Light Shape React APD   Right 4 3 Round Brisk 0   Left 4 3 Round Brisk 0         Visual Fields       Left Right    Full Full         Extraocular Movement       Right Left    Full Full         Neuro/Psych     Oriented x3: Yes   Mood/Affect: Normal          Dilation     Both eyes: 1.0% Mydriacyl, 2.5% Phenylephrine @ 8:49 AM           Slit Lamp and Fundus Exam     Slit Lamp Exam       Right Left   Lids/Lashes Dermatochalasis - upper lid, Meibomian gland dysfunction, mild Telangiectasia Dermatochalasis - upper  lid, Meibomian gland dysfunction, mild Telangiectasia   Conjunctiva/Sclera White and quiet White and quiet   Cornea Trace inferior Punctate epithelial erosions, tear film debris Trace inferior Punctate epithelial erosions, tear film debris   Anterior Chamber deep, clear, narrow temporal angle deep, clear, narrow temporal angle   Iris Round and dilated, No NVI Round and dilated, No NVI   Lens Cortical spokes Cortical spokes   Vitreous trace Vitreous syneresis trace Vitreous syneresis         Fundus Exam       Right Left   Disc Pink and Sharp, Compact Pink and Sharp, Compact   C/D Ratio 0.2 0.2   Macula Flat, good foveal reflex, scattered MA/DBH greatest temporal macula, trace cystic changes, mild focal laser changes Blunted foveal reflex, scattered IRH/DBH, +edema temporal macula -- slightly improved   Vessels attenuated, mild Tortuocity  early NV nasal to disc - regressing, attenuated, mild tortuousity   Periphery Attached, scattered MA/DBH greatest posteriorly    Attached, scattered MA/DBH greatest posteriorly and nasally, good early 360 PRP changes           IMAGING AND PROCEDURES  Imaging and Procedures for 07/07/2021  OCT, Retina - OU - Both Eyes       Right Eye Quality was good. Central Foveal Thickness: 226. Progression has improved. Findings include normal foveal contour, no SRF, intraretinal hyper-reflective material, no IRF (Mild persistent cystic changes greatest superior and temporal macula -- improved).   Left Eye Quality was good. Central Foveal Thickness: 235. Progression has improved. Findings include intraretinal fluid, no SRF, abnormal foveal contour (persistent IRF/edema temporal macula - mild  improvement).   Notes *Images captured and stored on drive  Diagnosis / Impression:  DME OU (OS>OD) OD: Mild persistent cystic changes greatest superior and temporal macula -- improved OS: persistent IRF/edema temporal macula - mild improvement   Clinical management:  See below  Abbreviations: NFP - Normal foveal profile. CME - cystoid macular edema. PED - pigment epithelial detachment. IRF - intraretinal fluid. SRF - subretinal fluid. EZ - ellipsoid zone. ERM - epiretinal membrane. ORA - outer retinal atrophy. ORT - outer retinal tubulation. SRHM - subretinal hyper-reflective material. IRHM - intraretinal hyper-reflective material      Intravitreal Injection, Pharmacologic Agent - OS - Left Eye       Time Out 07/07/2021. 9:35 AM. Confirmed correct patient, procedure, site, and patient consented.   Anesthesia Topical anesthesia was used. Anesthetic medications included Lidocaine 2%, Proparacaine 0.5%.   Procedure Preparation included 5% betadine to ocular surface, eyelid speculum. A (32g) needle was used.   Injection: 1.25 mg Bevacizumab 1.25mg /0.25ml   Route: Intravitreal, Site: Left Eye   NDC: [id number], Lot: [id number], Expiration date: 08/17/2021, Waste: 0.05 mL   Post-op Post injection exam found visual acuity of at least counting fingers. The patient tolerated the procedure well. There were no complications. The patient received written and verbal post procedure care education. Post injection medications were not given.             ASSESSMENT/PLAN:    ICD-10-CM   1. Proliferative diabetic retinopathy of left eye with macular edema associated with type 2 diabetes mellitus (HCC)  D63.8756 Intravitreal Injection, Pharmacologic Agent - OS - Left Eye    Bevacizumab (AVASTIN) SOLN 1.25 mg    2. Retinal edema  H35.81 OCT, Retina - OU - Both Eyes    3. Moderate nonproliferative diabetic retinopathy of right eye with macular edema associated with type 2 diabetes  mellitus (HCC)  J88.4166     4. Essential hypertension  I10     5. Hypertensive retinopathy of both eyes  H35.033     1,2. Proliferative diabetic retinopathy w/ DME, OS  - s/p IVA OS #1 (02.02.22), #2 (03.04.22), #3 (04.01.22), #4 (05.12.22), #5 (06.09.22), #6 (07.21.22), #7 (08.18.22), #8 (09.15.22)  - s/p PRP OS (02.18.22) - BCVA 20/20 OS (improved) - FA (02.02.22) shows no NV OS  - OCT shows OS: persistent IRF/edema temporal macula - mild improvement - recommend IVA OS #9 today, 10.13.22 for DME with interval at 4 weeks - pt wishes to proceed - RBA of procedure discussed, questions answered - IVA informed consent obtained and signed, 02.02.22 (OS) - see procedure note - f/u 4 weeks DFE, OCT, possible injection   3. Severe Non-proliferative diabetic retinopathy, OD - FA (02.02.22) shows focal NV nasal periphery OD - s/p Focal laser OD (05.27.22) for DME - BCVA 20/20 OD - OCT shows OD: Mild persistent cystic changes greatest superior and temporal macula -- improved - exam shows scattered IRH - no treatment at this time - f/u 4 weeks, DFE, OCT  4,5. Hypertensive retinopathy OU - discussed importance of tight BP control - history of MI 04/2020 s/p stents - monitor   Ophthalmic Meds Ordered this visit:  Meds ordered this encounter  Medications   Bevacizumab (AVASTIN) SOLN 1.25 mg     Return in about 4 weeks (around 08/04/2021) for DFE, OCT, possible injection.  There are no Patient Instructions on file for this visit.   Explained the diagnoses, plan, and follow up with the patient and they expressed understanding.  Patient expressed understanding of the importance of proper follow up care.   This document serves as a record of services personally performed by Karie Chimera, MD, PhD. It was created on their behalf by Glee Arvin. Manson Passey, OA an ophthalmic technician. The creation of this record is the provider's dictation and/or activities during the visit.    Electronically  signed by: Glee Arvin. Manson Passey, [place] 10.10.2022 9:46 PM  This document serves as a record of services personally performed by Karie Chimera, MD, PhD. It was created on their behalf by Joni Reining, an ophthalmic technician. The creation of this record is the provider's dictation and/or activities during the visit.    Electronically signed by: Joni Reining COA, 07/07/21  9:46 PM  Karie Chimera, M.D., Ph.D. Diseases & Surgery of the Retina and Vitreous [facility]  I have reviewed the above documentation for accuracy and completeness, and I agree with the above. Karie Chimera, M.D., Ph.D. 07/07/21 9:51 PM  Abbreviations: M myopia (nearsighted); A astigmatism; H hyperopia (farsighted); P presbyopia; Mrx spectacle prescription;  CTL contact lenses; OD right eye; OS left eye; OU both eyes  XT exotropia; ET esotropia; PEK punctate epithelial keratitis; PEE punctate epithelial erosions; DES dry eye syndrome; MGD meibomian gland dysfunction; ATs artificial tears; PFAT's preservative free artificial tears; NSC nuclear sclerotic cataract; PSC posterior subcapsular cataract; ERM epi-retinal membrane; PVD posterior vitreous detachment; RD retinal detachment; DM diabetes mellitus; DR diabetic retinopathy; NPDR non-proliferative diabetic retinopathy; PDR proliferative diabetic retinopathy; CSME clinically significant macular edema; DME diabetic macular edema; dbh dot blot hemorrhages; CWS cotton wool spot; POAG primary open angle glaucoma; C/D cup-to-disc ratio; HVF humphrey visual field; GVF goldmann visual field; OCT optical coherence tomography; IOP intraocular pressure; BRVO Branch retinal vein occlusion; CRVO central retinal vein occlusion; CRAO central retinal artery occlusion; BRAO branch retinal artery occlusion; RT  retinal tear; SB scleral buckle; PPV pars plana vitrectomy; VH Vitreous hemorrhage; PRP panretinal laser photocoagulation; IVK intravitreal kenalog; VMT vitreomacular  traction; MH Macular hole;  NVD neovascularization of the disc; NVE neovascularization elsewhere; AREDS age related eye disease study; ARMD age related macular degeneration; POAG primary open angle glaucoma; EBMD epithelial/anterior basement membrane dystrophy; ACIOL anterior chamber intraocular lens; IOL intraocular lens; PCIOL posterior chamber intraocular lens; Phaco/IOL phacoemulsification with intraocular lens placement; PRK photorefractive keratectomy; LASIK laser assisted in situ keratomileusis; HTN hypertension; DM diabetes mellitus; COPD chronic obstructive pulmonary disease

## 2021-07-05 ENCOUNTER — Ambulatory Visit (HOSPITAL_COMMUNITY)
Admission: RE | Admit: 2021-07-05 | Discharge: 2021-07-05 | Disposition: A | Discharge status: Home / Self Care | Payer: BC Managed Care – PPO | Source: Ambulatory Visit | Attending: Cardiology | Admitting: Cardiology

## 2021-07-05 ENCOUNTER — Other Ambulatory Visit: Payer: Self-pay

## 2021-07-05 DIAGNOSIS — R252 Cramp and spasm: Secondary | ICD-10-CM | POA: Diagnosis not present

## 2021-07-05 DIAGNOSIS — R0989 Other specified symptoms and signs involving the circulatory and respiratory systems: Secondary | ICD-10-CM | POA: Diagnosis not present

## 2021-07-07 ENCOUNTER — Encounter (INDEPENDENT_AMBULATORY_CARE_PROVIDER_SITE_OTHER): Payer: Self-pay | Admitting: Ophthalmology

## 2021-07-07 ENCOUNTER — Other Ambulatory Visit: Payer: Self-pay

## 2021-07-07 ENCOUNTER — Ambulatory Visit (INDEPENDENT_AMBULATORY_CARE_PROVIDER_SITE_OTHER): Payer: BC Managed Care – PPO | Admitting: Ophthalmology

## 2021-07-07 DIAGNOSIS — E113311 Type 2 diabetes mellitus with moderate nonproliferative diabetic retinopathy with macular edema, right eye: Secondary | ICD-10-CM

## 2021-07-07 DIAGNOSIS — E113512 Type 2 diabetes mellitus with proliferative diabetic retinopathy with macular edema, left eye: Secondary | ICD-10-CM | POA: Diagnosis not present

## 2021-07-07 DIAGNOSIS — I10 Essential (primary) hypertension: Secondary | ICD-10-CM | POA: Diagnosis not present

## 2021-07-07 DIAGNOSIS — H35033 Hypertensive retinopathy, bilateral: Secondary | ICD-10-CM | POA: Diagnosis not present

## 2021-07-07 DIAGNOSIS — H3581 Retinal edema: Secondary | ICD-10-CM

## 2021-07-07 MED ORDER — BEVACIZUMAB CHEMO INJECTION 1.25MG/0.05ML SYRINGE FOR KALEIDOSCOPE
1.2500 mg | INTRAVITREAL | Status: AC | PRN
  Administered 2021-07-07: 1.25 mg via INTRAVITREAL

## 2021-07-12 ENCOUNTER — Other Ambulatory Visit: Payer: Self-pay | Admitting: Osteopathic Medicine

## 2021-07-26 ENCOUNTER — Ambulatory Visit (INDEPENDENT_AMBULATORY_CARE_PROVIDER_SITE_OTHER): Payer: BC Managed Care – PPO | Admitting: Internal Medicine

## 2021-07-26 ENCOUNTER — Other Ambulatory Visit: Payer: Self-pay

## 2021-07-26 ENCOUNTER — Encounter: Payer: Self-pay | Admitting: Internal Medicine

## 2021-07-26 VITALS — BP 118/88 | HR 91 | Ht 60.0 in | Wt 187.0 lb

## 2021-07-26 DIAGNOSIS — I10 Essential (primary) hypertension: Secondary | ICD-10-CM | POA: Diagnosis not present

## 2021-07-26 DIAGNOSIS — I255 Ischemic cardiomyopathy: Secondary | ICD-10-CM | POA: Diagnosis not present

## 2021-07-26 LAB — CBC
Hematocrit: 45.5 % (ref 34.0–46.6)
Hemoglobin: 15.2 g/dL (ref 11.1–15.9)
MCH: 29.5 pg (ref 26.6–33.0)
MCHC: 33.4 g/dL (ref 31.5–35.7)
MCV: 88 fL (ref 79–97)
Platelets: 230 10*3/uL (ref 150–450)
RBC: 5.16 x10E6/uL (ref 3.77–5.28)
RDW: 12.5 % (ref 11.7–15.4)
WBC: 8.2 10*3/uL (ref 3.4–10.8)

## 2021-07-26 LAB — BASIC METABOLIC PANEL
BUN/Creatinine Ratio: 13 (ref 9–23)
BUN: 11 mg/dL (ref 6–24)
CO2: 22 mmol/L (ref 20–29)
Calcium: 9.5 mg/dL (ref 8.7–10.2)
Chloride: 101 mmol/L (ref 96–106)
Creatinine, Ser: 0.82 mg/dL (ref 0.57–1.00)
Glucose: 179 mg/dL — ABNORMAL HIGH (ref 70–99)
Potassium: 4.5 mmol/L (ref 3.5–5.2)
Sodium: 137 mmol/L (ref 134–144)
eGFR: 87 mL/min/{1.73_m2} (ref >59)

## 2021-07-26 MED ORDER — TICAGRELOR 90 MG PO TABS: 90.0000 mg | ORAL_TABLET | Freq: Two times a day (BID) | ORAL | 3 refills | Status: DC

## 2021-07-26 MED ORDER — ATORVASTATIN CALCIUM 80 MG PO TABS: 80.0000 mg | ORAL_TABLET | Freq: Every day | ORAL | 3 refills | Status: DC

## 2021-07-26 MED ORDER — ISOSORBIDE MONONITRATE ER 30 MG PO TB24: ORAL_TABLET | ORAL | 3 refills | Status: DC

## 2021-07-26 MED ORDER — METOPROLOL SUCCINATE ER 25 MG PO TB24: 25.0000 mg | ORAL_TABLET | Freq: Two times a day (BID) | ORAL | 3 refills | Status: DC

## 2021-07-26 NOTE — Progress Notes (Signed)
Cardiology Office Note   Date:  07/28/2021   ID:  Sarah Kane, DOB 04-Nov-1970, MRN [id number]  PCP:  Sarah Nielsen, DO  Cardiologist: Dr. Dietrich Pates  Pt presents for f/u of CAD      History of Present Illness: Sarah Kane is a 50 y.o. female who presents for f/u of CAD    [name] has a hx of HTN, DM2 and CAD. She had an NSTEMI 04/2020 at which time she underwent LHC that showed diffuse CAD with 30% pLAD, 99% dLAD, 70% prox LCx, 100% mRCA, 70% dRCA. She underwent PCI/DES to RCA with resolution of symptoms. Per chart review, staged PCI could be considered of the LCx if refractory symptoms. Echo at that time showed an LVEF at 45-50%. Losartan was held due to soft BPs. She was seen in follow up 06/03/20 with continued mild chest discomfort. Imdur was added to her regimen.     After d/c she continued to have angina     She ultimately underwent LHC on 07/22/2020 which showed a proximal LCx lesion at 70% at which time DES/PCI was placed with recommendations for DAPT with ASA and Brilinta for 1 year.   Patient seen in ER in September    CP was felt to possibly due ton shingles   It was L sided   Worse with breath    Tender when pushed chest   GOne now \  The patient says she gets chest pain a lot   For example when on vacation at daughters home  Says her chest hurt all day   Did say when she touched her chest itwould hurt more     GOne now    Denies injury   NO cough   Breathing is OK   Diet:  Breakfast:  Chips   Lunch     Past Medical History:  Diagnosis Date   Arthritis    Coronary artery disease    COVID-19 05/2020   Diabetes mellitus without complication (HCC)    Diabetic retinopathy (HCC)    Hyperlipidemia    Hypertension    Hypertensive retinopathy    Myocardial infarct, old     Past Surgical History:  Procedure Laterality Date   CARDIAC CATHETERIZATION     CESAREAN SECTION  1990   CORONARY STENT INTERVENTION N/A 05/20/2020   Procedure: CORONARY STENT  INTERVENTION;  Surgeon: Kathleene Hazel, MD;  [facility];  Service: Cardiovascular;  Laterality: N/A;   CORONARY STENT INTERVENTION N/A 07/22/2020   Procedure: CORONARY STENT INTERVENTION;  Surgeon: Kathleene Hazel, MD;  [facility];  Service: Cardiovascular;  Laterality: N/A;   LEFT HEART CATH AND CORONARY ANGIOGRAPHY N/A 05/20/2020   Procedure: LEFT HEART CATH AND CORONARY ANGIOGRAPHY;  Surgeon: Kathleene Hazel, MD;  [facility];  Service: Cardiovascular;  Laterality: N/A;   TRIGGER FINGER RELEASE Right    thumb     Current Outpatient Medications  Medication Sig Dispense Refill   acetaminophen (TYLENOL) 500 MG tablet Take 1,000 mg by mouth every 6 (six) hours as needed for mild pain or headache.      aspirin 81 MG EC tablet Take 1 tablet (81 mg total) by mouth daily. Swallow whole. 30 tablet 11   escitalopram (LEXAPRO) 20 MG tablet Take 1 tablet by mouth once daily 90 tablet 0   gabapentin (NEURONTIN) 300 MG capsule TAKE 1 CAPSULE BY MOUTH THREE TIMES DAILY 270 capsule 0  glimepiride (AMARYL) 4 MG tablet TAKE 2 TABLETS BY MOUTH ONCE DAILY WITH BREAKFAST. Needs appointment. 60 tablet 0   Multiple Vitamins-Minerals (MULTIVITAMIN WOMEN PO) Take 1 tablet by mouth 3 (three) times a week.      OZEMPIC, 1 MG/DOSE, 4 MG/3ML SOPN INJECT 1MG  INTO THE SKIN ONCE A WEEK 15 mL 2   sitaGLIPtin (JANUVIA) 100 MG tablet Take 1 tablet (100 mg total) by mouth daily. 90 tablet 3   traMADol-acetaminophen (ULTRACET) 37.5-325 MG tablet Take 1-2 tablets by mouth every 6 (six) hours as needed. 20 tablet 0   Vitamin D, Ergocalciferol, (DRISDOL) 1.25 MG (50000 UNIT) CAPS capsule Take 1 capsule by mouth once a week 12 capsule 3   atorvastatin (LIPITOR) 80 MG tablet Take 1 tablet (80 mg total) by mouth daily. 90 tablet 3   isosorbide mononitrate (IMDUR) 30 MG 24 hr tablet TAKE 1 & 1/2 (ONE & ONE-HALF) TABLETS BY MOUTH ONCE DAILY 135 tablet 3    metoprolol succinate (TOPROL-XL) 25 MG 24 hr tablet Take 1 tablet (25 mg total) by mouth 2 (two) times daily with a meal. 180 tablet 3   prazosin (MINIPRESS) 2 MG capsule Take 1 capsule (2 mg total) by mouth at bedtime. (Patient not taking: Reported on 07/26/2021) 90 capsule 0   ticagrelor (BRILINTA) 90 MG TABS tablet Take 1 tablet (90 mg total) by mouth 2 (two) times daily. 180 tablet 3   No current facility-administered medications for this visit.    Allergies:   Nitroglycerin    Social History:  The patient  reports that she has never smoked. She has never used smokeless tobacco. She reports that she does not drink alcohol and does not use drugs.   Family History:  The patient's family history includes Diabetes in her maternal aunt, maternal grandfather, maternal grandmother, maternal uncle, and mother; Heart attack in her maternal grandfather; High blood pressure in her maternal grandfather, maternal grandmother, mother, paternal grandfather, and paternal grandmother; Skin cancer in her maternal uncle.    ROS:  Please see the history of present illness. Otherwise, review of systems are positive for none.   All other systems are reviewed and negative.    PHYSICAL EXAM: VS:  BP 118/88   Pulse 91   Ht 5' (1.524 m)   Wt 187 lb (84.8 kg)   SpO2 97%   BMI 36.52 kg/m  , BMI Body mass index is 36.52 kg/m.   General: Obese 50 yo in NAD  Neck: No JVD Lungs:Clear to ausculation bilaterally. Cardiovascular: RRR with S1 S2. No murmurs  1+ PT  Extremities: No edema. Radial pulses 2+ bilaterally Neuro: Alert and oriented. No focal deficits.  Psych: Responds to questions appropriately with normal affect.     EKG:  EKG is not ordered today.   Recent Labs: 03/10/2021: ALT 29 07/26/2021: BUN 11; Creatinine, Ser 0.82; Hemoglobin 15.2; Platelets 230; Potassium 4.5; Sodium 137    Lipid Panel    Component Value Date/Time   CHOL 129 12/13/2020 0736   CHOL 135 06/18/2020 1226   TRIG 194 (H)  12/13/2020 0736   HDL 57 12/13/2020 0736   HDL 49 06/18/2020 1226   CHOLHDL 2.3 12/13/2020 0736   VLDL 30 05/21/2020 0438   LDLCALC 45 12/13/2020 0736   Wt Readings from Last 3 Encounters:  07/26/21 187 lb (84.8 kg)  06/27/21 180 lb (81.6 kg)  03/19/21 179 lb 14.3 oz (81.6 kg)    Other studies Reviewed: Additional studies/ records that were reviewed today  include:  Review of the above records demonstrates:   Echo 07/01/21    Cardiac Catheterization 07/22/2020:   Prox Cx lesion is 70% stenosed. A drug-eluting stent was successfully placed using a SYNERGY XD 3.0X16. Post intervention, there is a 0% residual stenosis.   1. Severe stenosis proximal Circumflex artery 2. Successful PTCA/DES x 1 proximal Circumflex   Recommendations: Continue DAPT with ASA and Brilinta for one year.    Diagnostic Dominance: Right  Intervention       ASSESSMENT AND PLAN:  1. Chest pain with know CAD s/p recent PCI and known residual LCx disease: -Underwent LHC with PCI to RCA 04/2020>>placed on ASA and Brilinta -She was eventually referred for repeat LHC on 07/22/2020 which showed a proximal LCx lesion at 70% at which time DES/PCI was placed with recommendations for DAPT with ASA and Brilinta for 1 year -Continue DAPT, statin and beta blocker   I do not thin the spells of CP that she explains represent angina  Sound muscular    WOuld follow   2. HLD: -Lipids in March LDL 45  HDL 57     3. DM2: -A1C 7.7 in March   Discussed diet again  [facility] does not check sugars    4. HTN:  -BP is controlled on meds       5. Cardiomyopathy  Echo on 10/7 LVEF normal     6   Culinary Medicine   PT still very eager to get classes when start up   I told her that plans are stil to go forward   -    Signed, Dietrich Pates, MD  07/28/2021 11:56 PM    [facility] [address] [place], [place], [place]  [address] Phone: [phone]; Fax: [phone]

## 2021-07-26 NOTE — Patient Instructions (Signed)
Medication Instructions:  Your physician recommends that you continue on your current medications as directed. Please refer to the Current Medication list given to you today.  *If you need a refill on your cardiac medications before your next appointment, please call your pharmacy*   Lab Work: TODAY: BMET, CBC  If you have labs (blood work) drawn today and your tests are completely normal, you will receive your results only by: MyChart Message (if you have MyChart) OR A paper copy in the mail If you have any lab test that is abnormal or we need to change your treatment, we will call you to review the results.   Follow-Up: At [facility], you and your health needs are our priority.  As part of our continuing mission to provide you with exceptional heart care, we have created designated Provider Care Teams.  These Care Teams include your primary Cardiologist (physician) and Advanced Practice Providers (APPs -  Physician Assistants and Nurse Practitioners) who all work together to provide you with the care you need, when you need it.   Your next appointment:   6 month(s)  The format for your next appointment:   In Person  Provider:   You may see Dietrich Pates, MD or one of the following Advanced Practice Providers on your designated Care Team:   Tereso Newcomer, PA-C [place], [place]

## 2021-08-02 ENCOUNTER — Telehealth: Payer: Self-pay | Admitting: *Deleted

## 2021-08-02 NOTE — Telephone Encounter (Signed)
Clarification provided by Dr. Tenny Craw about pts PCP.  Per Dr. Tenny Craw, pt reported to her that she no longer has a PCP and/or seeing PCP on file.  Dr. Tenny Craw suggested that pt establish with a new PCP that's in the area where pt is from, Dr. Warner Mccreedy at [facility].  Called [facility]'s office to inquire how we could get an appt with Dr. Dallas Schimke for the pt to establish care with her.  Per the secretary at Dr. Durel Salts office, Dr. Dallas Schimke is not accepting new patients at this time, however, the pt can schedule an appointment with Ria Clock NP, who works directly under supervision of Dr. Dallas Schimke.  Per the secretary at [facility], Ria Clock NP is accepting new pts and first available would be sometime in Feb 2023.   Secretary advised to have the pt reach out to their office at [phone] and she can further assist her with obtaining this appt  Tried calling the pt to endorse this information and see if she would like to establish with [facility] in [place] to see Ria Clock NP as her new PCP.  Pt did not answer, but did leave her a message to call the office back for further assistance with this.

## 2021-08-02 NOTE — Telephone Encounter (Signed)
Message sent by Dr. Tenny Craw:  Needs PCP   Please check if Sarah Kane can see her.  Staff message sent from Dr. Tenny Craw on this pt, and as indicated above.  Noted pt does have a PCP on file, Sarah Kane.  Message sent back to Dr. Tenny Craw to clarify if pt should follow-up with her established PCP or switch to Sarah Kane.  Will also need clarification from Dr. Tenny Craw as to the reason why pt is needing to get back in with her PCP for.   Will route this message to Dr. Tenny Craw and RN for further advisement and follow-up with the pt.

## 2021-08-03 NOTE — Progress Notes (Signed)
[facility] - Clinic Note  08/04/2021     CHIEF COMPLAINT Patient presents for Retina Follow Up   HISTORY OF PRESENT ILLNESS: Sarah Kane is a 50 y.o. female who presents to the clinic today for:   HPI     Retina Follow Up   Patient presents with  Diabetic Retinopathy.  In both eyes.  This started months ago.  Severity is moderate.  Duration of 4 weeks.  Since onset it is stable.  I, the attending physician,  performed the HPI with the patient and updated documentation appropriately.        Comments   50 y/o female pt here for 4 wk f/u for DR OU.  No change in [place] OU.  Denies pain, FOL, floaters.  No gtts.      Last edited by Rennis Chris, MD on 08/04/2021  5:01 PM.       Referring physician: Aura Camps MD [address] [place], [place] [address]  HISTORICAL INFORMATION:  Selected notes from the MEDICAL RECORD NUMBER Referred by Dr. Karleen Hampshire for diabetic eye exam LEE: [phone], BCVA OD: 20/50 OS: 20/70 Ocular Hx- DM, cataracts PMH- DM, cholesterol    CURRENT MEDICATIONS: No current outpatient medications on file. (Ophthalmic Drugs)   No current facility-administered medications for this visit. (Ophthalmic Drugs)   Current Outpatient Medications (Other)  Medication Sig   acetaminophen (TYLENOL) 500 MG tablet Take 1,000 mg by mouth every 6 (six) hours as needed for mild pain or headache.    aspirin 81 MG EC tablet Take 1 tablet (81 mg total) by mouth daily. Swallow whole.   atorvastatin (LIPITOR) 80 MG tablet Take 1 tablet (80 mg total) by mouth daily.   escitalopram (LEXAPRO) 20 MG tablet Take 1 tablet by mouth once daily   gabapentin (NEURONTIN) 300 MG capsule TAKE 1 CAPSULE BY MOUTH THREE TIMES DAILY   glimepiride (AMARYL) 4 MG tablet TAKE 2 TABLETS BY MOUTH ONCE DAILY WITH BREAKFAST. Needs appointment.   isosorbide mononitrate (IMDUR) 30 MG 24 hr tablet TAKE 1 & 1/2 (ONE & ONE-HALF) TABLETS BY MOUTH ONCE DAILY   metoprolol  succinate (TOPROL-XL) 25 MG 24 hr tablet Take 1 tablet (25 mg total) by mouth 2 (two) times daily with a meal.   Multiple Vitamins-Minerals (MULTIVITAMIN WOMEN PO) Take 1 tablet by mouth 3 (three) times a week.    OZEMPIC, 1 MG/DOSE, 4 MG/3ML SOPN INJECT 1MG  INTO THE SKIN ONCE A WEEK   prazosin (MINIPRESS) 2 MG capsule Take 1 capsule (2 mg total) by mouth at bedtime. (Patient not taking: Reported on 07/26/2021)   sitaGLIPtin (JANUVIA) 100 MG tablet Take 1 tablet (100 mg total) by mouth daily.   ticagrelor (BRILINTA) 90 MG TABS tablet Take 1 tablet (90 mg total) by mouth 2 (two) times daily.   traMADol-acetaminophen (ULTRACET) 37.5-325 MG tablet Take 1-2 tablets by mouth every 6 (six) hours as needed.   Vitamin D, Ergocalciferol, (DRISDOL) 1.25 MG (50000 UNIT) CAPS capsule Take 1 capsule by mouth once a week   No current facility-administered medications for this visit. (Other)   REVIEW OF SYSTEMS: ROS   Positive for: Neurological, Musculoskeletal, Endocrine, Cardiovascular, Eyes Negative for: Constitutional, Gastrointestinal, Skin, Genitourinary, HENT, Respiratory, Psychiatric, Allergic/Imm, Heme/Lymph Last edited by Celine Mans, COA on 08/04/2021  9:31 AM.     ALLERGIES Allergies  Allergen Reactions   Nitroglycerin     Blood pressure dropped, was hospitalized     PAST MEDICAL HISTORY Past Medical  History:  Diagnosis Date   Arthritis    Coronary artery disease    COVID-19 05/2020   Diabetes mellitus without complication (HCC)    Diabetic retinopathy (HCC)    Hyperlipidemia    Hypertension    Hypertensive retinopathy    Myocardial infarct, old    Past Surgical History:  Procedure Laterality Date   CARDIAC CATHETERIZATION     CESAREAN SECTION  1990   CORONARY STENT INTERVENTION N/A 05/20/2020   Procedure: CORONARY STENT INTERVENTION;  Surgeon: Kathleene Hazel, MD;  Location: [facility];  Service: Cardiovascular;  Laterality: N/A;   CORONARY STENT  INTERVENTION N/A 07/22/2020   Procedure: CORONARY STENT INTERVENTION;  Surgeon: Kathleene Hazel, MD;  Location: [facility]AS[facility] CV [facility];  Service: Cardiovascular;  Laterality: N/A;   LEFT HEART CATH AND CORONARY ANGIOGRAPHY N/A 05/20/2020   Procedure: LEFT HEART CATH AND CORONARY ANGIOGRAPHY;  Surgeon: Kathleene Hazel, MD;  Location: [facility]VAS[facility] CV [facility];  Service: Cardiovascular;  Laterality: N/A;   TRIGGER FINGER RELEASE Right    thumb    FAMILY HISTORY Family History  Problem Relation Age of Onset   High blood pressure Mother    Diabetes Mother    Diabetes Maternal Grandmother    High blood pressure Maternal Grandmother    Heart attack Maternal Grandfather    Diabetes Maternal Grandfather    High blood pressure Maternal Grandfather    High blood pressure Paternal Grandmother    High blood pressure Paternal Grandfather    Diabetes Maternal Aunt    Diabetes Maternal Uncle    Skin cancer Maternal Uncle     SOCIAL HISTORY Social History   Tobacco Use   Smoking status: Never   Smokeless tobacco: Never  Vaping Use   Vaping Use: Never used  Substance Use Topics   Alcohol use: No   Drug use: No       OPHTHALMIC EXAM: Base Eye Exam     Visual Acuity (Snellen - Linear)       Right Left   Dist [place] 20/20 -2 20/25 -   Dist ph [place]  NI         Tonometry (Tonopen, 9:33 AM)       Right Left   Pressure 12 13         Pupils       Dark Light Shape React APD   Right 4 3 Round Brisk None   Left 4 3 Round Brisk None         Visual Fields (Counting fingers)       Left Right    Full Full         Extraocular Movement       Right Left    Full, Ortho Full, Ortho         Neuro/Psych     Oriented x3: Yes   Mood/Affect: Normal         Dilation     Both eyes: 1.0% Mydriacyl, 2.5% Phenylephrine @ 9:33 AM           Slit Lamp and Fundus Exam     Slit Lamp Exam       Right Left   Lids/Lashes Dermatochalasis - upper lid, Meibomian gland  dysfunction, mild Telangiectasia Dermatochalasis - upper lid, Meibomian gland dysfunction, mild Telangiectasia   Conjunctiva/Sclera White and quiet White and quiet   Cornea Trace inferior Punctate epithelial erosions, tear film debris Trace inferior Punctate epithelial erosions, tear film debris   Anterior  Chamber deep, clear, narrow temporal angle deep, clear, narrow temporal angle   Iris Round and dilated, No NVI Round and dilated, No NVI   Lens Cortical spokes Cortical spokes   Vitreous trace Vitreous syneresis trace Vitreous syneresis         Fundus Exam       Right Left   Disc Pink and Sharp, Compact Pink and Sharp, Compact   C/D Ratio 0.2 0.2   Macula Flat, good foveal reflex, scattered MA/DBH greatest temporal macula, trace cystic changes, mild focal laser changes Blunted foveal reflex, scattered IRH/DBH, +edema temporal macula -- slightly increased   Vessels attenuated, mild Tortuocity  early NV nasal to disc - regressing, attenuated, mild tortuousity, mild Copper wiring, AV crossing changes   Periphery Attached, scattered MA/DBH greatest posteriorly    Attached, scattered MA/DBH greatest posteriorly and nasally, good early 360 PRP changes           IMAGING AND PROCEDURES  Imaging and Procedures for 08/04/2021  OCT, Retina - OU - Both Eyes       Right Eye Quality was good. Central Foveal Thickness: 223. Progression has improved. Findings include normal foveal contour, no SRF, intraretinal hyper-reflective material, no IRF (Mild persistent cystic changes greatest superior and temporal macula -- improved).   Left Eye Quality was good. Central Foveal Thickness: 245. Progression has worsened. Findings include intraretinal fluid, no SRF, abnormal foveal contour (Mild interval increase in IRF/edema temporal macula ).   Notes *Images captured and stored on drive  Diagnosis / Impression:  DME OU (OS>OD) OD: Mild persistent cystic changes greatest superior and temporal macula  -- improved OS: Mild interval increase in IRF/edema temporal macula    Clinical management:  See below  Abbreviations: NFP - Normal foveal profile. CME - cystoid macular edema. PED - pigment epithelial detachment. IRF - intraretinal fluid. SRF - subretinal fluid. EZ - ellipsoid zone. ERM - epiretinal membrane. ORA - outer retinal atrophy. ORT - outer retinal tubulation. SRHM - subretinal hyper-reflective material. IRHM - intraretinal hyper-reflective material      Intravitreal Injection, Pharmacologic Agent - OS - Left Eye       Time Out 08/04/2021. 10:06 AM. Confirmed correct patient, procedure, site, and patient consented.   Anesthesia Topical anesthesia was used. Anesthetic medications included Lidocaine 2%, Proparacaine 0.5%.   Procedure Preparation included 5% betadine to ocular surface, eyelid speculum. A (32g) needle was used.   Injection: 1.25 mg Bevacizumab 1.25mg /0.66ml   Route: Intravitreal, Site: Left Eye   NDC: [id number], Lot: [id number], Expiration date: 09/13/2021, Waste: 0.05 mL   Post-op Post injection exam found visual acuity of at least counting fingers. The patient tolerated the procedure well. There were no complications. The patient received written and verbal post procedure care education. Post injection medications were not given.            ASSESSMENT/PLAN:    ICD-10-CM   1. 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 Retinal edema  H35.81 OCT, Retina - OU - Both Eyes    3. Moderate nonproliferative diabetic retinopathy of right eye with macular edema associated with type 2 diabetes mellitus (HCC)  E11.3311     4. Essential hypertension  I10     5. Hypertensive retinopathy of both eyes  H35.033      1,2. Proliferative diabetic  retinopathy w/ DME, OS  - FA  (02.02.22) shows focal NV nasal periphery OS - s/p IVA OS #1 (02.02.22), #2 (03.04.22), #3 (04.01.22), #4 (05.12.22), #5 (06.09.22), #6 (07.21.22), #7 (08.18.22), #8 (09.15.22), #9 (10.13.22)  - s/p PRP OS (02.18.22) - BCVA 20/20 OS (improved)  - OCT shows OS: Mild interval increase in IRF/edema temporal macula  - recommend IVA OS #10 today, 11.10.22 for DME with interval at 4 weeks - pt wishes to proceed - RBA of procedure discussed, questions answered - IVA informed consent obtained and signed, 02.02.22 (OS) - see procedure note - f/u 4 weeks DFE, OCT, possible injection   3. Severe Non-proliferative diabetic retinopathy, OD - FA (02.02.22) shows no NV OD - s/p Focal laser OD (05.27.22) for DME - BCVA 20/20 OD - OCT shows OD: Mild persistent cystic changes greatest superior and temporal macula -- improved - exam shows scattered IRH - no treatment at this time - f/u 4 weeks, DFE, OCT  4,5. Hypertensive retinopathy OU - discussed importance of tight BP control - history of MI 04/2020 s/p stents - monitor   Ophthalmic Meds Ordered this visit:  Meds ordered this encounter  Medications   Bevacizumab (AVASTIN) SOLN 1.25 mg     Return in about 4 weeks (around 09/01/2021) for f/u PDR OS, DFE, OCT.  There are no Patient Instructions on file for this visit.   Explained the diagnoses, plan, and follow up with the patient and they expressed understanding.  Patient expressed understanding of the importance of proper follow up care.   This document serves as a record of services personally performed by Karie Chimera, MD, PhD. It was created on their behalf by Glee Arvin. Manson Passey, OA an ophthalmic technician. The creation of this record is the provider's dictation and/or activities during the visit.    Electronically signed by: Glee Arvin. Manson Passey, [place] 11.09.2022 5:06 PM   Karie Chimera, M.D., Ph.D. Diseases & Surgery of the Retina and Vitreous [facility]  I have  reviewed the above documentation for accuracy and completeness, and I agree with the above. Karie Chimera, M.D., Ph.D. 08/04/21 5:06 PM   Abbreviations: M myopia (nearsighted); A astigmatism; H hyperopia (farsighted); P presbyopia; Mrx spectacle prescription;  CTL contact lenses; OD right eye; OS left eye; OU both eyes  XT exotropia; ET esotropia; PEK punctate epithelial keratitis; PEE punctate epithelial erosions; DES dry eye syndrome; MGD meibomian gland dysfunction; ATs artificial tears; PFAT's preservative free artificial tears; NSC nuclear sclerotic cataract; PSC posterior subcapsular cataract; ERM epi-retinal membrane; PVD posterior vitreous detachment; RD retinal detachment; DM diabetes mellitus; DR diabetic retinopathy; NPDR non-proliferative diabetic retinopathy; PDR proliferative diabetic retinopathy; CSME clinically significant macular edema; DME diabetic macular edema; dbh dot blot hemorrhages; CWS cotton wool spot; POAG primary open angle glaucoma; C/D cup-to-disc ratio; HVF humphrey visual field; GVF goldmann visual field; OCT optical coherence tomography; IOP intraocular pressure; BRVO Branch retinal vein occlusion; CRVO central retinal vein occlusion; CRAO central retinal artery occlusion; BRAO branch retinal artery occlusion; RT retinal tear; SB scleral buckle; PPV pars plana vitrectomy; VH Vitreous hemorrhage; PRP panretinal laser photocoagulation; IVK intravitreal kenalog; VMT vitreomacular traction; MH Macular hole;  NVD neovascularization of the disc; NVE neovascularization elsewhere; AREDS age related eye disease study; ARMD age related macular degeneration; POAG primary open angle glaucoma; EBMD epithelial/anterior basement membrane dystrophy; ACIOL anterior chamber intraocular lens; IOL intraocular lens; PCIOL posterior chamber intraocular lens; Phaco/IOL phacoemulsification with intraocular lens placement; PRK photorefractive keratectomy; LASIK  laser assisted in situ keratomileusis;  HTN hypertension; DM diabetes mellitus; COPD chronic obstructive pulmonary disease

## 2021-08-04 ENCOUNTER — Encounter (INDEPENDENT_AMBULATORY_CARE_PROVIDER_SITE_OTHER): Payer: Self-pay | Admitting: Ophthalmology

## 2021-08-04 ENCOUNTER — Other Ambulatory Visit: Payer: Self-pay

## 2021-08-04 ENCOUNTER — Ambulatory Visit (INDEPENDENT_AMBULATORY_CARE_PROVIDER_SITE_OTHER): Payer: BC Managed Care – PPO | Admitting: Ophthalmology

## 2021-08-04 DIAGNOSIS — I10 Essential (primary) hypertension: Secondary | ICD-10-CM | POA: Diagnosis not present

## 2021-08-04 DIAGNOSIS — H3581 Retinal edema: Secondary | ICD-10-CM

## 2021-08-04 DIAGNOSIS — H35033 Hypertensive retinopathy, bilateral: Secondary | ICD-10-CM | POA: Diagnosis not present

## 2021-08-04 DIAGNOSIS — E113512 Type 2 diabetes mellitus with proliferative diabetic retinopathy with macular edema, left eye: Secondary | ICD-10-CM | POA: Diagnosis not present

## 2021-08-04 DIAGNOSIS — E113311 Type 2 diabetes mellitus with moderate nonproliferative diabetic retinopathy with macular edema, right eye: Secondary | ICD-10-CM | POA: Diagnosis not present

## 2021-08-04 MED ORDER — BEVACIZUMAB CHEMO INJECTION 1.25MG/0.05ML SYRINGE FOR KALEIDOSCOPE
1.2500 mg | INTRAVITREAL | Status: AC | PRN
  Administered 2021-08-04: 1.25 mg via INTRAVITREAL

## 2021-08-08 NOTE — Telephone Encounter (Signed)
Left a message for the pt to call back.  

## 2021-08-15 NOTE — Telephone Encounter (Signed)
Left another message for the pt to call back.. will send her a My Chart letter with the PCP information.

## 2021-08-26 NOTE — Progress Notes (Signed)
[facility] - Clinic Note  09/01/2021     CHIEF COMPLAINT Patient presents for Retina Follow Up   HISTORY OF PRESENT ILLNESS: Sarah Kane is a 50 y.o. female who presents to the clinic today for:   HPI     Retina Follow Up   Patient presents with  Diabetic Retinopathy.  In left eye.  This started 4 weeks ago.  I, the attending physician,  performed the HPI with the patient and updated documentation appropriately.        Comments   Patient here for 4 weeks retina follow up for PDR OS. Patient states vision doing the same. No eye pain. Has pain when rubs eye.      Last edited by Rennis Chris, MD on 09/01/2021  8:58 PM.     Referring physician: Aura Camps MD [address] [place], [place] [address]  HISTORICAL INFORMATION:  Selected notes from the MEDICAL RECORD NUMBER Referred by Dr. Karleen Hampshire for diabetic eye exam LEE: [id number], BCVA OD: 20/50 OS: 20/70 Ocular Hx- DM, cataracts PMH- DM, cholesterol    CURRENT MEDICATIONS: No current outpatient medications on file. (Ophthalmic Drugs)   No current facility-administered medications for this visit. (Ophthalmic Drugs)   Current Outpatient Medications (Other)  Medication Sig   acetaminophen (TYLENOL) 500 MG tablet Take 1,000 mg by mouth every 6 (six) hours as needed for mild pain or headache.    aspirin 81 MG EC tablet Take 1 tablet (81 mg total) by mouth daily. Swallow whole.   atorvastatin (LIPITOR) 80 MG tablet Take 1 tablet (80 mg total) by mouth daily.   escitalopram (LEXAPRO) 20 MG tablet Take 1 tablet by mouth once daily   gabapentin (NEURONTIN) 300 MG capsule TAKE 1 CAPSULE BY MOUTH THREE TIMES DAILY   glimepiride (AMARYL) 4 MG tablet TAKE 2 TABLETS BY MOUTH ONCE DAILY WITH BREAKFAST. Needs appointment.   isosorbide mononitrate (IMDUR) 30 MG 24 hr tablet TAKE 1 & 1/2 (ONE & ONE-HALF) TABLETS BY MOUTH ONCE DAILY   metoprolol succinate (TOPROL-XL) 25 MG 24 hr tablet Take 1 tablet (25 mg  total) by mouth 2 (two) times daily with a meal.   Multiple Vitamins-Minerals (MULTIVITAMIN WOMEN PO) Take 1 tablet by mouth 3 (three) times a week.    OZEMPIC, 1 MG/DOSE, 4 MG/3ML SOPN INJECT 1MG  INTO THE SKIN ONCE A WEEK   sitaGLIPtin (JANUVIA) 100 MG tablet Take 1 tablet (100 mg total) by mouth daily.   ticagrelor (BRILINTA) 90 MG TABS tablet Take 1 tablet (90 mg total) by mouth 2 (two) times daily.   traMADol-acetaminophen (ULTRACET) 37.5-325 MG tablet Take 1-2 tablets by mouth every 6 (six) hours as needed.   Vitamin D, Ergocalciferol, (DRISDOL) 1.25 MG (50000 UNIT) CAPS capsule Take 1 capsule by mouth once a week   prazosin (MINIPRESS) 2 MG capsule Take 1 capsule (2 mg total) by mouth at bedtime. (Patient not taking: Reported on 07/26/2021)   No current facility-administered medications for this visit. (Other)   REVIEW OF SYSTEMS: ROS   Positive for: Neurological, Musculoskeletal, Endocrine, Cardiovascular, Eyes Negative for: Constitutional, Gastrointestinal, Skin, Genitourinary, HENT, Respiratory, Psychiatric, Allergic/Imm, Heme/Lymph Last edited by 13/09/2020, COA on 09/01/2021  9:23 AM.      ALLERGIES Allergies  Allergen Reactions   Nitroglycerin     Blood pressure dropped, was hospitalized     PAST MEDICAL HISTORY Past Medical History:  Diagnosis Date   Arthritis    Coronary artery disease  COVID-19 05/2020   Diabetes mellitus without complication (HCC)    Diabetic retinopathy (HCC)    Hyperlipidemia    Hypertension    Hypertensive retinopathy    Myocardial infarct, old    Past Surgical History:  Procedure Laterality Date   CARDIAC CATHETERIZATION     CESAREAN SECTION  1990   CORONARY STENT INTERVENTION N/A 05/20/2020   Procedure: CORONARY STENT INTERVENTION;  Surgeon: Kathleene Hazel, MD;  Location: [facility];  Service: Cardiovascular;  Laterality: N/A;   CORONARY STENT INTERVENTION N/A 07/22/2020   Procedure: CORONARY STENT  INTERVENTION;  Surgeon: Kathleene Hazel, MD;  Location: [facility];  Service: Cardiovascular;  Laterality: N/A;   LEFT HEART CATH AND CORONARY ANGIOGRAPHY N/A 05/20/2020   Procedure: LEFT HEART CATH AND CORONARY ANGIOGRAPHY;  Surgeon: Kathleene Hazel, MD;  Location: [facility];  Service: Cardiovascular;  Laterality: N/A;   TRIGGER FINGER RELEASE Right    thumb    FAMILY HISTORY Family History  Problem Relation Age of Onset   High blood pressure Mother    Diabetes Mother    Diabetes Maternal Grandmother    High blood pressure Maternal Grandmother    Heart attack Maternal Grandfather    Diabetes Maternal Grandfather    High blood pressure Maternal Grandfather    High blood pressure Paternal Grandmother    High blood pressure Paternal Grandfather    Diabetes Maternal Aunt    Diabetes Maternal Uncle    Skin cancer Maternal Uncle     SOCIAL HISTORY Social History   Tobacco Use   Smoking status: Never   Smokeless tobacco: Never  Vaping Use   Vaping Use: Never used  Substance Use Topics   Alcohol use: No   Drug use: No       OPHTHALMIC EXAM: Base Eye Exam     Visual Acuity (Snellen - Linear)       Right Left   Dist [place] 20/30 20/25 -2   Dist ph  20/20 20/25 +1         Tonometry (Tonopen, 9:20 AM)       Right Left   Pressure 17 19         Pupils       Dark Light Shape React APD   Right 4 3 Round Brisk None   Left 4 3 Round Brisk None         Visual Fields (Counting fingers)       Left Right    Full Full         Extraocular Movement       Right Left    Full, Ortho Full, Ortho         Neuro/Psych     Oriented x3: Yes   Mood/Affect: Normal         Dilation     Both eyes: 1.0% Mydriacyl, 2.5% Phenylephrine @ 9:20 AM           Slit Lamp and Fundus Exam     Slit Lamp Exam       Right Left   Lids/Lashes Dermatochalasis - upper lid, Meibomian gland dysfunction, mild Telangiectasia Dermatochalasis -  upper lid, Meibomian gland dysfunction, mild Telangiectasia   Conjunctiva/Sclera White and quiet White and quiet   Cornea Trace inferior Punctate epithelial erosions, tear film debris Trace inferior Punctate epithelial erosions, tear film debris   Anterior Chamber deep, clear, narrow temporal angle deep, clear, narrow temporal angle   Iris Round and  dilated, No NVI Round and dilated, No NVI   Lens Cortical spokes Cortical spokes   Anterior Vitreous trace Vitreous syneresis trace Vitreous syneresis         Fundus Exam       Right Left   Disc Pink and Sharp, Compact Pink and Sharp, Compact   C/D Ratio 0.2 0.2   Macula Flat, good foveal reflex, scattered MA/DBH greatest temporal macula, trace cystic changes, mild focal laser changes Blunted foveal reflex, scattered IRH/DBH - improving, +edema temporal macula -- slightly improved   Vessels attenuated, mild Tortuocity  early NV nasal to disc - regressing, attenuated, mild tortuousity, mild Copper wiring, AV crossing changes   Periphery Attached, scattered MA/DBH greatest posteriorly    Attached, scattered MA/DBH greatest posteriorly and nasally, good early 360 PRP changes           IMAGING AND PROCEDURES  Imaging and Procedures for 09/01/2021  OCT, Retina - OU - Both Eyes       Right Eye Quality was good. Central Foveal Thickness: 226. Progression has improved. Findings include normal foveal contour, no SRF, intraretinal hyper-reflective material, no IRF (Mild persistent cystic changes greatest superior and temporal macula -- improved).   Left Eye Quality was good. Central Foveal Thickness: 243. Progression has improved. Findings include intraretinal fluid, no SRF, abnormal foveal contour (Mild interval improvement in IRF/edema temporal macula ).   Notes *Images captured and stored on drive  Diagnosis / Impression:  DME OU (OS>OD) OD: Mild persistent cystic changes greatest superior and temporal macula -- improved OS: Mild  interval improvement in IRF/edema temporal macula    Clinical management:  See below  Abbreviations: NFP - Normal foveal profile. CME - cystoid macular edema. PED - pigment epithelial detachment. IRF - intraretinal fluid. SRF - subretinal fluid. EZ - ellipsoid zone. ERM - epiretinal membrane. ORA - outer retinal atrophy. ORT - outer retinal tubulation. SRHM - subretinal hyper-reflective material. IRHM - intraretinal hyper-reflective material      Intravitreal Injection, Pharmacologic Agent - OS - Left Eye       Time Out 09/01/2021. 9:47 AM. Confirmed correct patient, procedure, site, and patient consented.   Anesthesia Topical anesthesia was used. Anesthetic medications included Lidocaine 2%, Proparacaine 0.5%.   Procedure Preparation included 5% betadine to ocular surface, eyelid speculum. A supplied (32g) needle was used.   Injection: 1.25 mg Bevacizumab 1.25mg /0.82ml   Route: Intravitreal, Site: Left Eye   NDC: 71696-789-38, Lot: [id number] , Expiration date: 11/01/2021, Waste: 0 mL   Post-op Post injection exam found visual acuity of at least counting fingers. The patient tolerated the procedure well. There were no complications. The patient received written and verbal post procedure care education. Post injection medications were not given.             ASSESSMENT/PLAN:    ICD-10-CM   1. 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 Retinal edema  H35.81 OCT, Retina - OU - Both Eyes    3. Moderate nonproliferative diabetic retinopathy of right eye with macular edema associated with type 2 diabetes mellitus (HCC)  E11.3311     4. Essential hypertension  I10     5. Hypertensive retinopathy of both eyes  H35.033      1,2. Proliferative diabetic retinopathy w/ DME, OS  - FA (02.02.22) shows  focal  NV nasal periphery OS - s/p IVA OS #1 (02.02.22), #2 (03.04.22), #3 (04.01.22), #4 (05.12.22), #5 (06.09.22), #6 (07.21.22), #7 (08.18.22), #8 (09.15.22), #9 (10.13.22), #10 (11.10.22)  - s/p PRP OS (02.18.22) - BCVA 20/25 OS  - OCT shows OS: Mild interval improvement in IRF/edema temporal macula at 4 wks - recommend IVA OS #11 today, 12.08.22 for DME with interval at 4 weeks - pt wishes to proceed - RBA of procedure discussed, questions answered - IVA informed consent obtained and signed, 02.02.22 (OS) - see procedure note - f/u 4 weeks DFE, OCT, possible injection   3. Severe Non-proliferative diabetic retinopathy, OD - FA (02.02.22) shows no NV OD - s/p Focal laser OD (05.27.22) for DME - BCVA 20/20 OD - OCT shows OD: Mild persistent cystic changes greatest superior and temporal macula -- improved - exam shows scattered IRH - no treatment at this time - f/u 4 weeks, DFE, OCT  4,5. Hypertensive retinopathy OU - discussed importance of tight BP control - history of MI 04/2020 s/p stents - monitor   Ophthalmic Meds Ordered this visit:  Meds ordered this encounter  Medications   Bevacizumab (AVASTIN) SOLN 1.25 mg      Return in about 4 weeks (around 09/29/2021) for f/u PDR OS, DFE, OCT.  There are no Patient Instructions on file for this visit.   Explained the diagnoses, plan, and follow up with the patient and they expressed understanding.  Patient expressed understanding of the importance of proper follow up care.   This document serves as a record of services personally performed by Karie Chimera, MD, PhD. It was created on their behalf by Glee Arvin. Manson Passey, OA an ophthalmic technician. The creation of this record is the provider's dictation and/or activities during the visit.    Electronically signed by: Glee Arvin. Manson Passey, [place] 12.02.2022 9:03 PM  Karie Chimera, M.D., Ph.D. Diseases & Surgery of the Retina and Vitreous [facility]  I have  reviewed the above documentation for accuracy and completeness, and I agree with the above. Karie Chimera, M.D., Ph.D. 09/01/21 9:04 PM   Abbreviations: M myopia (nearsighted); A astigmatism; H hyperopia (farsighted); P presbyopia; Mrx spectacle prescription;  CTL contact lenses; OD right eye; OS left eye; OU both eyes  XT exotropia; ET esotropia; PEK punctate epithelial keratitis; PEE punctate epithelial erosions; DES dry eye syndrome; MGD meibomian gland dysfunction; ATs artificial tears; PFAT's preservative free artificial tears; NSC nuclear sclerotic cataract; PSC posterior subcapsular cataract; ERM epi-retinal membrane; PVD posterior vitreous detachment; RD retinal detachment; DM diabetes mellitus; DR diabetic retinopathy; NPDR non-proliferative diabetic retinopathy; PDR proliferative diabetic retinopathy; CSME clinically significant macular edema; DME diabetic macular edema; dbh dot blot hemorrhages; CWS cotton wool spot; POAG primary open angle glaucoma; C/D cup-to-disc ratio; HVF humphrey visual field; GVF goldmann visual field; OCT optical coherence tomography; IOP intraocular pressure; BRVO Branch retinal vein occlusion; CRVO central retinal vein occlusion; CRAO central retinal artery occlusion; BRAO branch retinal artery occlusion; RT retinal tear; SB scleral buckle; PPV pars plana vitrectomy; VH Vitreous hemorrhage; PRP panretinal laser photocoagulation; IVK intravitreal kenalog; VMT vitreomacular traction; MH Macular hole;  NVD neovascularization of the disc; NVE neovascularization elsewhere; AREDS age related eye disease study; ARMD age related macular degeneration; POAG primary open angle glaucoma; EBMD epithelial/anterior basement membrane dystrophy; ACIOL anterior chamber intraocular lens; IOL intraocular lens; PCIOL posterior chamber intraocular lens; Phaco/IOL phacoemulsification with intraocular lens placement; PRK photorefractive keratectomy; LASIK laser assisted in situ keratomileusis;  HTN hypertension;  DM diabetes mellitus; COPD chronic obstructive pulmonary disease

## 2021-09-01 ENCOUNTER — Ambulatory Visit (INDEPENDENT_AMBULATORY_CARE_PROVIDER_SITE_OTHER): Payer: BC Managed Care – PPO | Admitting: Ophthalmology

## 2021-09-01 ENCOUNTER — Other Ambulatory Visit: Payer: Self-pay

## 2021-09-01 ENCOUNTER — Encounter (INDEPENDENT_AMBULATORY_CARE_PROVIDER_SITE_OTHER): Payer: Self-pay | Admitting: Ophthalmology

## 2021-09-01 DIAGNOSIS — E113512 Type 2 diabetes mellitus with proliferative diabetic retinopathy with macular edema, left eye: Secondary | ICD-10-CM

## 2021-09-01 DIAGNOSIS — I10 Essential (primary) hypertension: Secondary | ICD-10-CM | POA: Diagnosis not present

## 2021-09-01 DIAGNOSIS — E113311 Type 2 diabetes mellitus with moderate nonproliferative diabetic retinopathy with macular edema, right eye: Secondary | ICD-10-CM

## 2021-09-01 DIAGNOSIS — H35033 Hypertensive retinopathy, bilateral: Secondary | ICD-10-CM | POA: Diagnosis not present

## 2021-09-01 DIAGNOSIS — H3581 Retinal edema: Secondary | ICD-10-CM

## 2021-09-01 MED ORDER — BEVACIZUMAB CHEMO INJECTION 1.25MG/0.05ML SYRINGE FOR KALEIDOSCOPE
1.2500 mg | INTRAVITREAL | Status: AC | PRN
  Administered 2021-09-01: 1.25 mg via INTRAVITREAL

## 2021-09-05 ENCOUNTER — Other Ambulatory Visit: Payer: Self-pay | Admitting: Osteopathic Medicine

## 2021-09-05 DIAGNOSIS — E1165 Type 2 diabetes mellitus with hyperglycemia: Secondary | ICD-10-CM

## 2021-09-26 NOTE — Progress Notes (Signed)
[facility] - Clinic Note  09/29/2021     CHIEF COMPLAINT Patient presents for Retina Follow Up    HISTORY OF PRESENT ILLNESS: Sarah Kane is a 51 y.o. female who presents to the clinic today for:   HPI     Retina Follow Up   Patient presents with  Diabetic Retinopathy.  In both eyes.  This started 4 weeks ago.  I, the attending physician,  performed the HPI with the patient and updated documentation appropriately.        Comments   Patient here for 4 weeks retina follow up for PDR OU. Patient states vision stopped wearing readers 2 weeks ago. Wants eyes to get stronger. Feels shots no working. No eye pain.       Last edited by Rennis Chris, MD on 09/29/2021  9:29 AM.    Pt doesn't feel like vision is getting any better  Referring physician: Aura Camps MD [address] [place], [place] [address]  HISTORICAL INFORMATION:  Selected notes from the MEDICAL RECORD NUMBER Referred by Dr. Karleen Hampshire for diabetic eye exam LEE: 12.278.21, BCVA OD: 20/50 OS: 20/70 Ocular Hx- DM, cataracts PMH- DM, cholesterol    CURRENT MEDICATIONS: No current outpatient medications on file. (Ophthalmic Drugs)   No current facility-administered medications for this visit. (Ophthalmic Drugs)   Current Outpatient Medications (Other)  Medication Sig   acetaminophen (TYLENOL) 500 MG tablet Take 1,000 mg by mouth every 6 (six) hours as needed for mild pain or headache.    aspirin 81 MG EC tablet Take 1 tablet (81 mg total) by mouth daily. Swallow whole.   atorvastatin (LIPITOR) 80 MG tablet Take 1 tablet (80 mg total) by mouth daily.   escitalopram (LEXAPRO) 20 MG tablet Take 1 tablet by mouth once daily   gabapentin (NEURONTIN) 300 MG capsule TAKE 1 CAPSULE BY MOUTH THREE TIMES DAILY   glimepiride (AMARYL) 4 MG tablet TAKE 2 TABLETS BY MOUTH ONCE DAILY WITH BREAKFAST. Must establish with new PCP for future refills.   isosorbide mononitrate (IMDUR) 30 MG 24 hr tablet  TAKE 1 & 1/2 (ONE & ONE-HALF) TABLETS BY MOUTH ONCE DAILY   metoprolol succinate (TOPROL-XL) 25 MG 24 hr tablet Take 1 tablet (25 mg total) by mouth 2 (two) times daily with a meal.   Multiple Vitamins-Minerals (MULTIVITAMIN WOMEN PO) Take 1 tablet by mouth 3 (three) times a week.    OZEMPIC, 1 MG/DOSE, 4 MG/3ML SOPN INJECT 1MG  INTO THE SKIN ONCE A WEEK   sitaGLIPtin (JANUVIA) 100 MG tablet Take 1 tablet (100 mg total) by mouth daily.   ticagrelor (BRILINTA) 90 MG TABS tablet Take 1 tablet (90 mg total) by mouth 2 (two) times daily.   traMADol-acetaminophen (ULTRACET) 37.5-325 MG tablet Take 1-2 tablets by mouth every 6 (six) hours as needed.   Vitamin D, Ergocalciferol, (DRISDOL) 1.25 MG (50000 UNIT) CAPS capsule Take 1 capsule by mouth once a week   prazosin (MINIPRESS) 2 MG capsule Take 1 capsule (2 mg total) by mouth at bedtime. (Patient not taking: Reported on 07/26/2021)   No current facility-administered medications for this visit. (Other)   REVIEW OF SYSTEMS: ROS   Positive for: Neurological, Musculoskeletal, Endocrine, Cardiovascular, Eyes Negative for: Constitutional, Gastrointestinal, Skin, Genitourinary, HENT, Respiratory, Psychiatric, Allergic/Imm, Heme/Lymph Last edited by Laddie Aquas, COA on 09/29/2021  9:11 AM.     ALLERGIES Allergies  Allergen Reactions   Nitroglycerin     Blood pressure dropped, was hospitalized  PAST MEDICAL HISTORY Past Medical History:  Diagnosis Date   Arthritis    Coronary artery disease    COVID-19 05/2020   Diabetes mellitus without complication (HCC)    Diabetic retinopathy (HCC)    Hyperlipidemia    Hypertension    Hypertensive retinopathy    Myocardial infarct, old    Past Surgical History:  Procedure Laterality Date   CARDIAC CATHETERIZATION     CESAREAN SECTION  1990   CORONARY STENT INTERVENTION N/A 05/20/2020   Procedure: CORONARY STENT INTERVENTION;  Surgeon: Kathleene Hazel, MD;  Location: [facility];   Service: Cardiovascular;  Laterality: N/A;   CORONARY STENT INTERVENTION N/A 07/22/2020   Procedure: CORONARY STENT INTERVENTION;  Surgeon: Kathleene Hazel, MD;  Location: [facility];  Service: Cardiovascular;  Laterality: N/A;   LEFT HEART CATH AND CORONARY ANGIOGRAPHY N/A 05/20/2020   Procedure: LEFT HEART CATH AND CORONARY ANGIOGRAPHY;  Surgeon: Kathleene Hazel, MD;  Location: [facility];  Service: Cardiovascular;  Laterality: N/A;   TRIGGER FINGER RELEASE Right    thumb    FAMILY HISTORY Family History  Problem Relation Age of Onset   High blood pressure Mother    Diabetes Mother    Diabetes Maternal Grandmother    High blood pressure Maternal Grandmother    Heart attack Maternal Grandfather    Diabetes Maternal Grandfather    High blood pressure Maternal Grandfather    High blood pressure Paternal Grandmother    High blood pressure Paternal Grandfather    Diabetes Maternal Aunt    Diabetes Maternal Uncle    Skin cancer Maternal Uncle     SOCIAL HISTORY Social History   Tobacco Use   Smoking status: Never   Smokeless tobacco: Never  Vaping Use   Vaping Use: Never used  Substance Use Topics   Alcohol use: No   Drug use: No       OPHTHALMIC EXAM: Base Eye Exam     Visual Acuity (Snellen - Linear)       Right Left   Dist [place] 20/20 -1 20/30 -2   Dist ph [place]  20/25 -2         Tonometry (Tonopen, 9:09 AM)       Right Left   Pressure 13 13         Pupils       Dark Light Shape React APD   Right 4 3 Round Brisk None   Left 4 3 Round Brisk None         Visual Fields (Counting fingers)       Left Right    Full Full         Extraocular Movement       Right Left    Full, Ortho Full, Ortho         Neuro/Psych     Oriented x3: Yes   Mood/Affect: Normal         Dilation     Both eyes: 1.0% Mydriacyl, 2.5% Phenylephrine @ 9:08 AM           Slit Lamp and Fundus Exam     Slit Lamp Exam       Right  Left   Lids/Lashes Dermatochalasis - upper lid, Meibomian gland dysfunction, mild Telangiectasia Dermatochalasis - upper lid, Meibomian gland dysfunction, mild Telangiectasia   Conjunctiva/Sclera White and quiet White and quiet   Cornea Trace inferior Punctate epithelial erosions, tear film debris Trace inferior Punctate epithelial erosions,  tear film debris   Anterior Chamber deep, clear, narrow temporal angle deep, clear, narrow temporal angle   Iris Round and dilated, No NVI Round and dilated, No NVI   Lens Cortical spokes Cortical spokes   Anterior Vitreous trace Vitreous syneresis trace Vitreous syneresis         Fundus Exam       Right Left   Disc Pink and Sharp, Compact Pink and Sharp, Compact   C/D Ratio 0.2 0.2   Macula Flat, good foveal reflex, scattered MA/DBH greatest temporal macula, trace cystic changes, mild focal laser changes Blunted foveal reflex, scattered IRH/DBH - improving, +edema temporal macula -- slightly improved   Vessels attenuated, mild tortuousity early NV nasal to disc - regressing, attenuated, mild tortuousity, mild Copper wiring, AV crossing changes   Periphery Attached, scattered MA/DBH greatest posteriorly Attached, scattered MA/DBH greatest posteriorly and nasally, good 360 PRP changes           IMAGING AND PROCEDURES  Imaging and Procedures for 09/29/2021  OCT, Retina - OU - Both Eyes       Right Eye Quality was good. Central Foveal Thickness: 224. Progression has been stable. Findings include normal foveal contour, no SRF, intraretinal hyper-reflective material, intraretinal fluid (Mild persistent cystic changes greatest superior and temporal macula ).   Left Eye Quality was good. Central Foveal Thickness: 250. Progression has improved. Findings include intraretinal fluid, no SRF, abnormal foveal contour (Mild interval improvement in IRF/edema temporal macula ).   Notes *Images captured and stored on drive  Diagnosis / Impression:  DME OU  (OS>OD) OD: Mild persistent cystic changes greatest superior and temporal macula  OS: Mild interval improvement in IRF/edema temporal macula    Clinical management:  See below  Abbreviations: NFP - Normal foveal profile. CME - cystoid macular edema. PED - pigment epithelial detachment. IRF - intraretinal fluid. SRF - subretinal fluid. EZ - ellipsoid zone. ERM - epiretinal membrane. ORA - outer retinal atrophy. ORT - outer retinal tubulation. SRHM - subretinal hyper-reflective material. IRHM - intraretinal hyper-reflective material      Intravitreal Injection, Pharmacologic Agent - OS - Left Eye       Time Out 09/29/2021. 9:19 AM. Confirmed correct patient, procedure, site, and patient consented.   Anesthesia Topical anesthesia was used. Anesthetic medications included Lidocaine 2%, Proparacaine 0.5%.   Procedure Preparation included 5% betadine to ocular surface, eyelid speculum. A supplied (32g) needle was used.   Injection: 1.25 mg Bevacizumab 1.25mg /0.68ml   Route: Intravitreal, Site: Left Eye   NDC: [id number], Lot: [id number] , Expiration date: 11/01/2021, Waste: 0 mL   Post-op Post injection exam found visual acuity of at least counting fingers. The patient tolerated the procedure well. There were no complications. The patient received written and verbal post procedure care education. Post injection medications were not given.            ASSESSMENT/PLAN:    ICD-10-CM   1. 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 Essential hypertension  I10     4. Hypertensive retinopathy of both eyes  H35.033      1. Proliferative diabetic retinopathy w/ DME,  OS  - FA (02.02.22)  shows focal NV nasal periphery OS - s/p IVA OS #1 (02.02.22), #2 (03.04.22), #3 (04.01.22), #4 (05.12.22), #5 (06.09.22), #6 (07.21.22), #7 (08.18.22), #8 (09.15.22), #9 (10.13.22), #10 (11.10.22), #11 (12.08.22)  - s/p PRP OS (02.18.22) - BCVA 20/25 OS  - OCT shows OS: Mild interval improvement in IRF/edema temporal macula at 4 wks - recommend IVA OS #12 today, 01.05.23 for DME with interval at 4 weeks - pt wishes to proceed - RBA of procedure discussed, questions answered - IVA informed consent obtained and signed, 02.02.22 (OS) - see procedure note - f/u 4 weeks DFE, OCT, possible injection   2. Severe Non-proliferative diabetic retinopathy, OD - FA (02.02.22) shows no NV OD - s/p Focal laser OD (05.27.22) for DME - BCVA 20/20 OD - OCT shows OD: Mild persistent cystic changes greatest superior and temporal macula -- improved - exam shows scattered IRH - no treatment at this time - f/u 4 weeks, DFE, OCT  3,4. Hypertensive retinopathy OU - discussed importance of tight BP control - history of MI 04/2020 s/p stents - monitor   Ophthalmic Meds Ordered this visit:  No orders of the defined types were placed in this encounter.     Return in about 4 weeks (around 10/27/2021) for f/u PDR OU, DFE, OCT.  There are no Patient Instructions on file for this visit.   Explained the diagnoses, plan, and follow up with the patient and they expressed understanding.  Patient expressed understanding of the importance of proper follow up care.   This document serves as a record of services personally performed by Karie Chimera, MD, PhD. It was created on their behalf by Glee Arvin. Manson Passey, OA an ophthalmic technician. The creation of this record is the provider's dictation and/or activities during the visit.    Electronically signed by: Glee Arvin. Manson Passey, [place] 01.02.2023 11:04 AM   Karie Chimera, M.D., Ph.D. Diseases & Surgery of the Retina and Vitreous [facility]  I have reviewed the above documentation for accuracy and completeness, and I agree with the above. Karie Chimera, M.D., Ph.D. 09/29/21 11:06 AM   Abbreviations: M myopia (nearsighted); A astigmatism; H hyperopia (farsighted); P presbyopia; Mrx spectacle prescription;  CTL contact lenses; OD right eye; OS left eye; OU both eyes  XT exotropia; ET esotropia; PEK punctate epithelial keratitis; PEE punctate epithelial erosions; DES dry eye syndrome; MGD meibomian gland dysfunction; ATs artificial tears; PFAT's preservative free artificial tears; NSC nuclear sclerotic cataract; PSC posterior subcapsular cataract; ERM epi-retinal membrane; PVD posterior vitreous detachment; RD retinal detachment; DM diabetes mellitus; DR diabetic retinopathy; NPDR non-proliferative diabetic retinopathy; PDR proliferative diabetic retinopathy; CSME clinically significant macular edema; DME diabetic macular edema; dbh dot blot hemorrhages; CWS cotton wool spot; POAG primary open angle glaucoma; C/D cup-to-disc ratio; HVF humphrey visual field; GVF goldmann visual field; OCT optical coherence tomography; IOP intraocular pressure; BRVO Branch retinal vein occlusion; CRVO central retinal vein occlusion; CRAO central retinal artery occlusion; BRAO branch retinal artery occlusion; RT retinal tear; SB scleral buckle; PPV pars plana vitrectomy; VH Vitreous hemorrhage; PRP panretinal laser photocoagulation; IVK intravitreal kenalog; VMT vitreomacular traction; MH Macular hole;  NVD neovascularization of the disc; NVE neovascularization elsewhere; AREDS age related eye disease study; ARMD age related macular degeneration; POAG primary open angle glaucoma; EBMD epithelial/anterior basement membrane dystrophy; ACIOL anterior chamber intraocular lens; IOL intraocular lens; PCIOL posterior chamber intraocular lens; Phaco/IOL phacoemulsification with intraocular lens placement; PRK photorefractive keratectomy; LASIK laser  assisted in situ keratomileusis;  HTN hypertension; DM diabetes mellitus; COPD chronic obstructive pulmonary disease

## 2021-09-29 ENCOUNTER — Encounter (INDEPENDENT_AMBULATORY_CARE_PROVIDER_SITE_OTHER): Payer: Self-pay | Admitting: Ophthalmology

## 2021-09-29 ENCOUNTER — Other Ambulatory Visit: Payer: Self-pay

## 2021-09-29 ENCOUNTER — Ambulatory Visit (INDEPENDENT_AMBULATORY_CARE_PROVIDER_SITE_OTHER): Payer: BC Managed Care – PPO | Admitting: Ophthalmology

## 2021-09-29 DIAGNOSIS — E113512 Type 2 diabetes mellitus with proliferative diabetic retinopathy with macular edema, left eye: Secondary | ICD-10-CM | POA: Diagnosis not present

## 2021-09-29 DIAGNOSIS — I10 Essential (primary) hypertension: Secondary | ICD-10-CM

## 2021-09-29 DIAGNOSIS — H35033 Hypertensive retinopathy, bilateral: Secondary | ICD-10-CM | POA: Diagnosis not present

## 2021-09-29 DIAGNOSIS — E113311 Type 2 diabetes mellitus with moderate nonproliferative diabetic retinopathy with macular edema, right eye: Secondary | ICD-10-CM | POA: Diagnosis not present

## 2021-09-29 MED ORDER — BEVACIZUMAB CHEMO INJECTION 1.25MG/0.05ML SYRINGE FOR KALEIDOSCOPE
1.2500 mg | INTRAVITREAL | Status: AC | PRN
  Administered 2021-09-29: 1.25 mg via INTRAVITREAL

## 2021-09-30 ENCOUNTER — Telehealth: Payer: Self-pay | Admitting: Osteopathic Medicine

## 2021-09-30 ENCOUNTER — Telehealth: Payer: Self-pay | Admitting: Internal Medicine

## 2021-09-30 ENCOUNTER — Other Ambulatory Visit: Payer: Self-pay | Admitting: Osteopathic Medicine

## 2021-09-30 DIAGNOSIS — F418 Other specified anxiety disorders: Secondary | ICD-10-CM

## 2021-09-30 MED ORDER — GABAPENTIN 300 MG PO CAPS: 300.0000 mg | ORAL_CAPSULE | Freq: Three times a day (TID) | ORAL | 0 refills | Status: DC

## 2021-09-30 MED ORDER — SITAGLIPTIN PHOSPHATE 100 MG PO TABS: 100.0000 mg | ORAL_TABLET | Freq: Every day | ORAL | 0 refills | Status: DC

## 2021-09-30 MED ORDER — ESCITALOPRAM OXALATE 20 MG PO TABS: 20.0000 mg | ORAL_TABLET | Freq: Every day | ORAL | 0 refills | Status: DC

## 2021-09-30 NOTE — Telephone Encounter (Signed)
Left a message for the pt to call back.  

## 2021-09-30 NOTE — Telephone Encounter (Signed)
**Note De-Identified Rhema Boyett Obfuscation** No answer so I left a message on her VM asking her to call Larita Fife back at Dr Charlott Rakes office at [facility] at [phone].

## 2021-09-30 NOTE — Telephone Encounter (Signed)
Pt given samples of Brilinta to help her through the weekend... I gave her a new $5.00 copay card in case just needs renewal after the new year.... I also gave her Pt assistance information.   Pt to let us know where she is at with the process and if she needs any further assistance.

## 2021-09-30 NOTE — Telephone Encounter (Signed)
Pt c/o medication issue:  1. Name of Medication:  ticagrelor (BRILINTA) 90 MG TABS tablet  2. How are you currently taking this medication (dosage and times per day)? As prescribed   3. Are you having a reaction (difficulty breathing--STAT)? No   4. What is your medication issue? Sarah Kane is calling stating she cannot afford this medication. She states she has tried calling two numbers she found online to try and receive assistance with the cost, but has been unable to speak to an actual person. She is requesting a callback to assist with this and reports she only has two tablets left. Please advise.

## 2021-09-30 NOTE — Telephone Encounter (Signed)
Pt scheduled an appt with Sarah Kane in February [facility] SChristen Butterhe is out of her Lexapro, Gabapentin, and Januvia. [facility] [address] [facility] [place]. Thanks

## 2021-10-26 NOTE — Progress Notes (Signed)
[facility] Note  10/27/2021     CHIEF COMPLAINT Patient presents for Retina Follow Up    HISTORY OF PRESENT ILLNESS: Sarah Kane is a 51 y.o. female who presents to the clinic today for:   HPI     Retina Follow Up   Patient presents with  Diabetic Retinopathy.  In left eye.  Severity is moderate.  Duration of 4 weeks.  Since onset it is stable.  I, the attending physician,  performed the HPI with the patient and updated documentation appropriately.        Comments   Pt here for 4 wk f/u OS for PDR OS. Pt states vision is the same, no change. She is in the midst of moving. She has not checked her blood sugar yet this am. Pt just got off work.       Last edited by Bernarda Caffey, MD on 10/27/2021  5:09 PM.     Pt states no change in vision  Referring physician: Gevena Cotton MD [place], [place] [address]  HISTORICAL INFORMATION:  Selected notes from the MEDICAL RECORD NUMBER Referred by Dr. Frederico Hamman for diabetic eye exam LEE: [phone], BCVA OD: 20/50 OS: 20/70 Ocular Hx- DM, cataracts PMH- DM, cholesterol    CURRENT MEDICATIONS: No current outpatient medications on file. (Ophthalmic Drugs)   No current facility-administered medications for this visit. (Ophthalmic Drugs)   Current Outpatient Medications (Other)  Medication Sig   aspirin 81 MG EC tablet Take 1 tablet (81 mg total) by mouth daily. Swallow whole.   atorvastatin (LIPITOR) 80 MG tablet Take 1 tablet (80 mg total) by mouth daily.   escitalopram (LEXAPRO) 20 MG tablet Take 1 tablet (20 mg total) by mouth daily. NO REFILLS. NEEDS TO TRANSFER CARE TO NEW PCP.   gabapentin (NEURONTIN) 300 MG capsule Take 1 capsule (300 mg total) by mouth 3 (three) times daily.   glimepiride (AMARYL) 4 MG tablet TAKE 2 TABLETS BY MOUTH ONCE DAILY WITH BREAKFAST. Must establish with new PCP for future refills.   isosorbide mononitrate (IMDUR) 30 MG 24 hr tablet TAKE 1 & 1/2 (ONE &  ONE-HALF) TABLETS BY MOUTH ONCE DAILY   metoprolol succinate (TOPROL-XL) 25 MG 24 hr tablet Take 1 tablet (25 mg total) by mouth 2 (two) times daily with a meal.   Multiple Vitamins-Minerals (MULTIVITAMIN WOMEN PO) Take 1 tablet by mouth 3 (three) times a week.    OZEMPIC, 1 MG/DOSE, 4 MG/3ML SOPN INJECT 1MG  INTO THE SKIN ONCE A WEEK   sitaGLIPtin (JANUVIA) 100 MG tablet Take 1 tablet (100 mg total) by mouth daily.   ticagrelor (BRILINTA) 90 MG TABS tablet Take 1 tablet (90 mg total) by mouth 2 (two) times daily.   traMADol-acetaminophen (ULTRACET) 37.5-325 MG tablet Take 1-2 tablets by mouth every 6 (six) hours as needed.   Vitamin D, Ergocalciferol, (DRISDOL) 1.25 MG (50000 UNIT) CAPS capsule Take 1 capsule by mouth once a week   acetaminophen (TYLENOL) 500 MG tablet Take 1,000 mg by mouth every 6 (six) hours as needed for mild pain or headache.    prazosin (MINIPRESS) 2 MG capsule Take 1 capsule (2 mg total) by mouth at bedtime. (Patient not taking: Reported on 07/26/2021)   No current facility-administered medications for this visit. (Other)   REVIEW OF SYSTEMS: ROS   Positive for: Neurological, Musculoskeletal, Endocrine, Cardiovascular, Eyes Negative for: Constitutional, Gastrointestinal, Skin, Genitourinary, HENT, Respiratory, Psychiatric, Allergic/Imm, Heme/Lymph Last edited by Orvan Falconer  E, COT on 10/27/2021  9:06 AM.      ALLERGIES Allergies  Allergen Reactions   Nitroglycerin     Blood pressure dropped, was hospitalized     PAST MEDICAL HISTORY Past Medical History:  Diagnosis Date   Arthritis    Coronary artery disease    COVID-19 05/2020   Diabetes mellitus without complication ([place])    Diabetic retinopathy ([place])    Hyperlipidemia    Hypertension    Hypertensive retinopathy    Myocardial infarct, old    Past Surgical History:  Procedure Laterality Date   [place]   CORONARY STENT INTERVENTION N/A 05/20/2020    Procedure: CORONARY STENT INTERVENTION;  Surgeon: Burnell Blanks, MD;  Location: [facility];  Service: Cardiovascular;  Laterality: N/A;   CORONARY STENT INTERVENTION N/A 07/22/2020   Procedure: CORONARY STENT INTERVENTION;  Surgeon: Burnell Blanks, MD;  Location: [facility];  Service: Cardiovascular;  Laterality: N/A;   LEFT HEART CATH AND CORONARY ANGIOGRAPHY N/A 05/20/2020   Procedure: LEFT HEART CATH AND CORONARY ANGIOGRAPHY;  Surgeon: Burnell Blanks, MD;  Location: [facility];  Service: Cardiovascular;  Laterality: N/A;   TRIGGER FINGER RELEASE Right    thumb    FAMILY HISTORY Family History  Problem Relation Age of Onset   High blood pressure Mother    Diabetes Mother    Diabetes Maternal Grandmother    High blood pressure Maternal Grandmother    Heart attack Maternal Grandfather    Diabetes Maternal Grandfather    High blood pressure Maternal Grandfather    High blood pressure Paternal Grandmother    High blood pressure Paternal Grandfather    Diabetes Maternal Aunt    Diabetes Maternal Uncle    Skin cancer Maternal Uncle     SOCIAL HISTORY Social History   Tobacco Use   Smoking status: Never   Smokeless tobacco: Never  Vaping Use   Vaping Use: Never used  Substance Use Topics   Alcohol use: No   Drug use: No       OPHTHALMIC EXAM: Base Eye Exam     Visual Acuity (Snellen - Linear)       Right Left   Dist [place] 20/25 -2 20/50 -1   Dist ph [place] 20/25 20/30 -2         Tonometry (Tonopen, 9:21 AM)       Right Left   Pressure 12 17         Pupils       Dark Light Shape React APD   Right 4 3 Round Brisk None   Left 4 3 Round Brisk None         Visual Fields (Counting fingers)       Left Right    Full Full         Extraocular Movement       Right Left    Full, Ortho Full, Ortho         Neuro/Psych     Oriented x3: Yes   Mood/Affect: Normal         Dilation     Both eyes: 1.0%  Mydriacyl, 2.5% Phenylephrine @ 9:21 AM           Slit Lamp and Fundus Exam     Slit Lamp Exam       Right Left   Lids/Lashes Dermatochalasis - upper lid, Meibomian gland dysfunction, mild Telangiectasia Dermatochalasis -  upper lid, Meibomian gland dysfunction, mild Telangiectasia   Conjunctiva/Sclera White and quiet White and quiet   Cornea Trace inferior Punctate epithelial erosions, tear film debris Trace inferior Punctate epithelial erosions, tear film debris   Anterior Chamber deep, clear, narrow temporal angle deep, clear, narrow temporal angle   Iris Round and dilated, No NVI Round and dilated, No NVI   Lens Cortical spokes Cortical spokes   Anterior Vitreous trace Vitreous syneresis trace Vitreous syneresis         Fundus Exam       Right Left   Disc Pink and Sharp, Compact Pink and Sharp, Compact   C/D Ratio 0.2 0.2   Macula Flat, good foveal reflex, scattered MA/DBH greatest temporal macula, trace cystic changes - slightly improved, mild focal laser changes Blunted foveal reflex, scattered IRH/DBH - improving, +edema temporal macula -- slightly improved   Vessels attenuated, mild tortuousity early NV nasal to disc - regressing, attenuated, mild tortuousity, mild Copper wiring, AV crossing changes   Periphery Attached, scattered MA/DBH greatest posteriorly Attached, scattered MA/DBH greatest posteriorly and nasally, good 360 PRP changes           Refraction     Manifest Refraction       Sphere Cylinder Dist VA   Right      Left -0.75 Sphere 20/25-1           IMAGING AND PROCEDURES  Imaging and Procedures for 10/27/2021  OCT, Retina - OU - Both Eyes       Right Eye Quality was good. Central Foveal Thickness: 224. Progression has been stable. Findings include normal foveal contour, no SRF, intraretinal hyper-reflective material, intraretinal fluid (Mild persistent cystic changes greatest superior and temporal macula ).   Left Eye Quality was good.  Central Foveal Thickness: 249. Progression has improved. Findings include intraretinal fluid, no SRF, abnormal foveal contour (Persistent cystic changes temporal macula and fovea ).   Notes *Images captured and stored on drive  Diagnosis / Impression:  DME OU (OS>OD) OD: Mild persistent cystic changes greatest superior and temporal macula  OS: Persistent cystic changes temporal macula and fovea    Clinical management:  See below  Abbreviations: NFP - Normal foveal profile. CME - cystoid macular edema. PED - pigment epithelial detachment. IRF - intraretinal fluid. SRF - subretinal fluid. EZ - ellipsoid zone. ERM - epiretinal membrane. ORA - outer retinal atrophy. ORT - outer retinal tubulation. SRHM - subretinal hyper-reflective material. IRHM - intraretinal hyper-reflective material      Intravitreal Injection, Pharmacologic Agent - OS - Left Eye       Time Out 10/27/2021. 10:14 AM. Confirmed correct patient, procedure, site, and patient consented.   Anesthesia Topical anesthesia was used. Anesthetic medications included Lidocaine 2%, Proparacaine 0.5%.   Procedure Preparation included 5% betadine to ocular surface, eyelid speculum. A supplied (32g) needle was used.   Injection: 2 mg aflibercept 2 MG/0.05ML   Route: Intravitreal, Site: Left Eye   [id number], Lot: [id number], Expiration date: 08/25/2022, Waste: 0.05 mL   Post-op Post injection exam found visual acuity of at least counting fingers. The patient tolerated the procedure well. There were no complications. The patient received written and verbal post procedure care education. Post injection medications were not given.   Notes **SAMPLE MEDICATION ADMINISTERED**             ASSESSMENT/PLAN:    ICD-10-CM   1. 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 Moderate nonproliferative diabetic retinopathy of right eye with macular edema associated with type 2 diabetes mellitus ([facility])  E11.3311 OCT, Retina - OU - Both Eyes    3. Essential hypertension  I10     4. Hypertensive retinopathy of both eyes  H35.033      1. Proliferative diabetic retinopathy w/ DME, OS  - FA (02.02.22) shows focal NV nasal periphery OS - s/p IVA OS #1 (02.02.22), #2 (03.04.22), #3 (04.01.22), #4 (05.12.22), #5 (06.09.22), #6 (07.21.22), #7 (08.18.22), #8 (09.15.22), #9 (10.13.22), #10 (11.10.22), #11 (12.08.22), #12 (01.05.23)  - s/p PRP OS (02.18.22) - BCVA 20/25 OS  - OCT shows persistent cystic changes temporal macula and fovea OS at 4 wks  - discussed possible IVA resistance and switch to IVE - recommend IVE OS #1 today, 02.02.23 for DME with f/u in 4 weeks - pt wishes to proceed - RBA of procedure discussed, questions answered - IVA informed consent obtained and signed, 02.02.22 (OS) - IVE informed consent obtained and signed, 02.02.23 (OS) - see procedure note - f/u 4 weeks DFE, OCT, possible injection   2. Severe Non-proliferative diabetic retinopathy, OD - FA (02.02.22) shows no NV OD - s/p Focal laser OD (05.27.22) for DME - BCVA 20/20 OD - OCT shows OD: Mild persistent cystic changes greatest superior and temporal macula -- improved - exam shows scattered IRH - no treatment at this time - f/u 4 weeks, DFE, OCT  3,4. Hypertensive retinopathy OU - discussed importance of tight BP control - history of MI 04/2020 s/p stents - monitor   Ophthalmic Meds Ordered this visit:  Meds ordered this encounter  Medications   aflibercept (EYLEA) SOLN 2 mg      Return in about 4 weeks (around 11/24/2021) for DME OS, Dilated Exam, OCT, Possible Injxn.  There are no Patient Instructions on file for this visit.   Explained the diagnoses, plan, and follow up with the patient and they expressed  understanding.  Patient expressed understanding of the importance of proper follow up care.   This document serves as a record of services personally performed by Karie Chimera, MD, PhD. It was created on their behalf by Glee Arvin. Manson Passey, OA an ophthalmic technician. The creation of this record is the provider's dictation and/or activities during the visit.    Electronically signed by: Glee Arvin. [place], [place] 02.01.2023 5:11 PM  Karie Chimera, M.D., Ph.D. Diseases & Surgery of the Retina and Vitreous [facility]  I have reviewed the above documentation for accuracy and completeness, and I agree with the above. Karie Chimera, M.D., Ph.D. 10/27/21 5:13 PM    Abbreviations: M myopia (nearsighted); A astigmatism; H hyperopia (farsighted); P presbyopia; Mrx spectacle prescription;  CTL contact lenses; OD right eye; OS left eye; OU both eyes  XT exotropia; ET esotropia; PEK punctate epithelial keratitis; PEE punctate epithelial erosions; DES dry eye syndrome; MGD meibomian gland dysfunction; ATs artificial tears; PFAT's preservative free artificial tears; NSC nuclear sclerotic cataract; PSC posterior subcapsular cataract; ERM epi-retinal membrane; PVD posterior vitreous detachment; RD retinal detachment; DM diabetes mellitus; DR diabetic retinopathy; NPDR non-proliferative diabetic retinopathy; PDR proliferative diabetic retinopathy; CSME clinically significant macular edema; DME diabetic macular edema; dbh dot blot hemorrhages; CWS cotton wool spot; POAG primary open angle glaucoma; C/D cup-to-disc  ratio; HVF humphrey visual field; GVF goldmann visual field; OCT optical coherence tomography; IOP intraocular pressure; BRVO Branch retinal vein occlusion; CRVO central retinal vein occlusion; CRAO central retinal artery occlusion; BRAO branch retinal artery occlusion; RT retinal tear; SB scleral buckle; PPV pars plana vitrectomy; VH Vitreous hemorrhage; PRP panretinal laser  photocoagulation; IVK intravitreal kenalog; VMT vitreomacular traction; MH Macular hole;  NVD neovascularization of the disc; NVE neovascularization elsewhere; AREDS age related eye disease study; ARMD age related macular degeneration; POAG primary open angle glaucoma; EBMD epithelial/anterior basement membrane dystrophy; ACIOL anterior chamber intraocular lens; IOL intraocular lens; PCIOL posterior chamber intraocular lens; Phaco/IOL phacoemulsification with intraocular lens placement; [place] photorefractive keratectomy; LASIK laser assisted in situ keratomileusis; HTN hypertension; DM diabetes mellitus; COPD chronic obstructive pulmonary disease

## 2021-10-27 ENCOUNTER — Other Ambulatory Visit: Payer: Self-pay

## 2021-10-27 ENCOUNTER — Encounter (INDEPENDENT_AMBULATORY_CARE_PROVIDER_SITE_OTHER): Payer: Self-pay | Admitting: Ophthalmology

## 2021-10-27 ENCOUNTER — Ambulatory Visit (INDEPENDENT_AMBULATORY_CARE_PROVIDER_SITE_OTHER): Payer: BC Managed Care – PPO | Admitting: Ophthalmology

## 2021-10-27 DIAGNOSIS — H35033 Hypertensive retinopathy, bilateral: Secondary | ICD-10-CM | POA: Diagnosis not present

## 2021-10-27 DIAGNOSIS — I10 Essential (primary) hypertension: Secondary | ICD-10-CM | POA: Diagnosis not present

## 2021-10-27 DIAGNOSIS — E113311 Type 2 diabetes mellitus with moderate nonproliferative diabetic retinopathy with macular edema, right eye: Secondary | ICD-10-CM | POA: Diagnosis not present

## 2021-10-27 DIAGNOSIS — E113512 Type 2 diabetes mellitus with proliferative diabetic retinopathy with macular edema, left eye: Secondary | ICD-10-CM | POA: Diagnosis not present

## 2021-10-27 MED ORDER — AFLIBERCEPT 2MG/0.05ML IZ SOLN FOR KALEIDOSCOPE
2.0000 mg | INTRAVITREAL | Status: AC | PRN
  Administered 2021-10-27: 2 mg via INTRAVITREAL

## 2021-11-09 ENCOUNTER — Other Ambulatory Visit: Payer: Self-pay

## 2021-11-09 DIAGNOSIS — F418 Other specified anxiety disorders: Secondary | ICD-10-CM

## 2021-11-09 DIAGNOSIS — E1165 Type 2 diabetes mellitus with hyperglycemia: Secondary | ICD-10-CM

## 2021-11-09 MED ORDER — ESCITALOPRAM OXALATE 20 MG PO TABS: 20.0000 mg | ORAL_TABLET | Freq: Every day | ORAL | 0 refills | Status: DC

## 2021-11-09 MED ORDER — SITAGLIPTIN PHOSPHATE 100 MG PO TABS: 100.0000 mg | ORAL_TABLET | Freq: Every day | ORAL | 0 refills | Status: DC

## 2021-11-09 MED ORDER — GABAPENTIN 300 MG PO CAPS: 300.0000 mg | ORAL_CAPSULE | Freq: Three times a day (TID) | ORAL | 0 refills | Status: DC

## 2021-11-09 MED ORDER — GLIMEPIRIDE 4 MG PO TABS: ORAL_TABLET | ORAL | 0 refills | Status: DC

## 2021-11-09 NOTE — Progress Notes (Signed)
Patient stopped by the front desk. She is unable to keep her current TOC appt with provider. Next appt available is 01/02/22. Patient is currently out of meds. Advised that she must keep her next appt with provider to avoid delay in refills for her meds. Patient advised no further refills will be given without being seen in the office. Patient agreeable with plan of care. Two months of refills sent to the pharmacy.

## 2021-11-11 ENCOUNTER — Ambulatory Visit: Payer: BC Managed Care – PPO | Admitting: Medical-Surgical

## 2021-11-21 NOTE — Progress Notes (Signed)
[facility] Note  11/24/2021     CHIEF COMPLAINT Patient presents for Retina Follow Up    HISTORY OF PRESENT ILLNESS: Sarah Kane is a 51 y.o. female who presents to the clinic today for:   HPI     Retina Follow Up   Patient presents with  Diabetic Retinopathy.  In left eye.  Severity is moderate.  Duration of 4 weeks.  Since onset it is stable.  I, the attending physician,  performed the HPI with the patient and updated documentation appropriately.        Comments   Pt here for 4 wk ret f/u for PDR OS. Pt states last visit she felt VA was getting better but this time around she isn't sure if its worse or not but it doesn't feel improved. Pt also just got off work so she is very tired.       Last edited by Bernarda Caffey, MD on 11/24/2021  1:10 PM.    Pt states no change in vision  Referring physician: Gevena Cotton MD [place], [place] [address]  HISTORICAL INFORMATION:  Selected notes from the MEDICAL RECORD NUMBER Referred by Dr. Frederico Hamman for diabetic eye exam LEE: 12.278.21, BCVA OD: 20/50 OS: 20/70 Ocular Hx- DM, cataracts PMH- DM, cholesterol    CURRENT MEDICATIONS: No current outpatient medications on file. (Ophthalmic Drugs)   No current facility-administered medications for this visit. (Ophthalmic Drugs)   Current Outpatient Medications (Other)  Medication Sig   acetaminophen (TYLENOL) 500 MG tablet Take 1,000 mg by mouth every 6 (six) hours as needed for mild pain or headache.    aspirin 81 MG EC tablet Take 1 tablet (81 mg total) by mouth daily. Swallow whole.   atorvastatin (LIPITOR) 80 MG tablet Take 1 tablet (80 mg total) by mouth daily.   escitalopram (LEXAPRO) 20 MG tablet Take 1 tablet (20 mg total) by mouth daily. NO REFILLS. NEEDS TO TRANSFER CARE TO NEW PCP.   gabapentin (NEURONTIN) 300 MG capsule Take 1 capsule (300 mg total) by mouth 3 (three) times daily.   glimepiride (AMARYL) 4 MG tablet TAKE 2  TABLETS BY MOUTH ONCE DAILY WITH BREAKFAST. Must establish with new PCP for future refills.   isosorbide mononitrate (IMDUR) 30 MG 24 hr tablet TAKE 1 & 1/2 (ONE & ONE-HALF) TABLETS BY MOUTH ONCE DAILY   metoprolol succinate (TOPROL-XL) 25 MG 24 hr tablet Take 1 tablet (25 mg total) by mouth 2 (two) times daily with a meal.   Multiple Vitamins-Minerals (MULTIVITAMIN WOMEN PO) Take 1 tablet by mouth 3 (three) times a week.    OZEMPIC, 1 MG/DOSE, 4 MG/3ML SOPN INJECT 1MG INTO THE SKIN ONCE A WEEK   prazosin (MINIPRESS) 2 MG capsule Take 1 capsule (2 mg total) by mouth at bedtime.   sitaGLIPtin (JANUVIA) 100 MG tablet Take 1 tablet (100 mg total) by mouth daily.   ticagrelor (BRILINTA) 90 MG TABS tablet Take 1 tablet (90 mg total) by mouth 2 (two) times daily.   traMADol-acetaminophen (ULTRACET) 37.5-325 MG tablet Take 1-2 tablets by mouth every 6 (six) hours as needed.   Vitamin D, Ergocalciferol, (DRISDOL) 1.25 MG (50000 UNIT) CAPS capsule Take 1 capsule by mouth once a week   No current facility-administered medications for this visit. (Other)   REVIEW OF SYSTEMS: ROS   Positive for: Neurological, Musculoskeletal, Endocrine, Cardiovascular, Eyes Negative for: Constitutional, Gastrointestinal, Skin, Genitourinary, HENT, Respiratory, Psychiatric, Allergic/Imm, Heme/Lymph Last edited by Moshe Cipro,  Makenzie E, COT on 11/24/2021  9:00 AM.     ALLERGIES Allergies  Allergen Reactions   Nitroglycerin     Blood pressure dropped, was hospitalized    PAST MEDICAL HISTORY Past Medical History:  Diagnosis Date   Arthritis    Coronary artery disease    COVID-19 05/2020   Diabetes mellitus without complication ([place])    Diabetic retinopathy ([place])    Hyperlipidemia    Hypertension    Hypertensive retinopathy    Myocardial infarct, old    Past Surgical History:  Procedure Laterality Date   [place]   CORONARY STENT INTERVENTION N/A 05/20/2020    Procedure: CORONARY STENT INTERVENTION;  Surgeon: Burnell Blanks, MD;  Location: [facility];  Service: Cardiovascular;  Laterality: N/A;   CORONARY STENT INTERVENTION N/A 07/22/2020   Procedure: CORONARY STENT INTERVENTION;  Surgeon: Burnell Blanks, MD;  Location: [facility];  Service: Cardiovascular;  Laterality: N/A;   LEFT HEART CATH AND CORONARY ANGIOGRAPHY N/A 05/20/2020   Procedure: LEFT HEART CATH AND CORONARY ANGIOGRAPHY;  Surgeon: Burnell Blanks, MD;  Location: [facility];  Service: Cardiovascular;  Laterality: N/A;   TRIGGER FINGER RELEASE Right    thumb   FAMILY HISTORY Family History  Problem Relation Age of Onset   High blood pressure Mother    Diabetes Mother    Diabetes Maternal Grandmother    High blood pressure Maternal Grandmother    Heart attack Maternal Grandfather    Diabetes Maternal Grandfather    High blood pressure Maternal Grandfather    High blood pressure Paternal Grandmother    High blood pressure Paternal Grandfather    Diabetes Maternal Aunt    Diabetes Maternal Uncle    Skin cancer Maternal Uncle    SOCIAL HISTORY Social History   Tobacco Use   Smoking status: Never   Smokeless tobacco: Never  Vaping Use   Vaping Use: Never used  Substance Use Topics   Alcohol use: No   Drug use: No       OPHTHALMIC EXAM: Base Eye Exam     Visual Acuity (Snellen - Linear)       Right Left   Dist [place] 20/25 -2 20/40 +1   Dist ph [place] 20/20 -1 20/30 +2         Tonometry (Tonopen, 9:10 AM)       Right Left   Pressure 8 9         Pupils       Dark Light Shape React APD   Right 4 3 Round Brisk None   Left 4 3 Round Brisk None         Visual Fields (Counting fingers)       Left Right    Full Full         Extraocular Movement       Right Left    Full, Ortho Full, Ortho         Neuro/Psych     Oriented x3: Yes   Mood/Affect: Normal         Dilation     Both eyes: 1.0%  Mydriacyl, 2.5% Phenylephrine @ 9:11 AM           Slit Lamp and Fundus Exam     Slit Lamp Exam       Right Left   Lids/Lashes Dermatochalasis - upper lid, Meibomian gland dysfunction, mild Telangiectasia Dermatochalasis - upper  lid, Meibomian gland dysfunction, mild Telangiectasia   Conjunctiva/Sclera White and quiet White and quiet   Cornea Trace inferior Punctate epithelial erosions, tear film debris Trace inferior Punctate epithelial erosions, tear film debris   Anterior Chamber deep, clear, narrow temporal angle deep, clear, narrow temporal angle   Iris Round and dilated, No NVI Round and dilated, No NVI   Lens Cortical spokes Cortical spokes   Anterior Vitreous trace Vitreous syneresis trace Vitreous syneresis         Fundus Exam       Right Left   Disc Pink and Sharp, Compact Pink and Sharp, Compact   C/D Ratio 0.2 0.2   Macula Flat, good foveal reflex, scattered MA/DBH greatest temporal macula, trace cystic changes - slightly improved, mild focal laser changes Blunted foveal reflex, scattered IRH/DBH - improving, +edema temporal macula -- slightly improved   Vessels attenuated, mild tortuousity early NV nasal to disc - regressing, attenuated, mild tortuousity, mild Copper wiring, AV crossing changes   Periphery Attached, scattered MA/DBH greatest posteriorly Attached, scattered MA/DBH greatest posteriorly and nasally, good 360 PRP changes           IMAGING AND PROCEDURES  Imaging and Procedures for 11/24/2021  OCT, Retina - OU - Both Eyes       Right Eye Quality was good. Central Foveal Thickness: 226. Progression has improved. Findings include normal foveal contour, no SRF, intraretinal hyper-reflective material, no IRF (Trace cystic changes greatest temporal macula - improved from prior).   Left Eye Quality was good. Central Foveal Thickness: 241. Progression has improved. Findings include intraretinal fluid, no SRF, abnormal foveal contour (Interval improvement  in cystic changes temporal macula and fovea ).   Notes *Images captured and stored on drive  Diagnosis / Impression:  DME OU (OS>OD) OD: Trace cystic changes greatest temporal macula - improved from prior OS: Interval improvement in cystic changes temporal macula and fovea    Clinical management:  See below  Abbreviations: NFP - Normal foveal profile. CME - cystoid macular edema. PED - pigment epithelial detachment. IRF - intraretinal fluid. SRF - subretinal fluid. EZ - ellipsoid zone. ERM - epiretinal membrane. ORA - outer retinal atrophy. ORT - outer retinal tubulation. SRHM - subretinal hyper-reflective material. IRHM - intraretinal hyper-reflective material      Intravitreal Injection, Pharmacologic Agent - OS - Left Eye       Time Out 11/24/2021. 9:49 AM. Confirmed correct patient, procedure, site, and patient consented.   Anesthesia Topical anesthesia was used. Anesthetic medications included Lidocaine 2%, Proparacaine 0.5%.   Procedure Preparation included 5% betadine to ocular surface, eyelid speculum. A (32g) needle was used.   Injection: 2 mg aflibercept 2 MG/0.05ML   Route: Intravitreal, Site: Left Eye   NDC: [id number], Lot: [id number], Expiration date: 09/24/2022, Waste: 0.05 mL   Post-op Post injection exam found visual acuity of at least counting fingers. The patient tolerated the procedure well. There were no complications. The patient received written and verbal post procedure care education. Post injection medications were not given.            ASSESSMENT/PLAN:    ICD-10-CM   1. 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 Moderate nonproliferative diabetic retinopathy of right eye with macular edema associated with  type 2 diabetes mellitus  ([place])  E11.3311     3. Essential hypertension  I10     4. Hypertensive retinopathy of both eyes  H35.033      1. Proliferative diabetic retinopathy w/ DME, OS  - FA (02.02.22) shows focal NV nasal periphery OS - s/p IVA OS #1 (02.02.22), #2 (03.04.22), #3 (04.01.22), #4 (05.12.22), #5 (06.09.22), #6 (07.21.22), #7 (08.18.22), #8 (09.15.22), #9 (10.13.22), #10 (11.10.22), #11 (12.08.22), #12 (01.05.23) - s/p IVE OS #1 (02.02.23, sample)  - s/p PRP OS (02.18.22) - BCVA 20/25 OS  - OCT shows OS: Interval improvement in cystic changes temporal macula and fovea  - recommend IVE OS #2 today, 03.02.23 for DME with f/u in 4 weeks - pt wishes to proceed - RBA of procedure discussed, questions answered - IVA informed consent obtained and signed, 02.02.22 (OS) - IVE informed consent obtained and signed, 02.02.23 (OS) - see procedure note - f/u 4 weeks DFE, OCT, possible injection   2. Severe Non-proliferative diabetic retinopathy, OD - FA (02.02.22) shows no NV OD - s/p Focal laser OD (05.27.22) for DME - BCVA 20/20 OD - OCT shows OD: Trace cystic changes greatest temporal macula - improved from prior - exam shows scattered IRH - no treatment at this time - f/u 4 weeks, DFE, OCT  3,4. Hypertensive retinopathy OU - discussed importance of tight BP control - history of MI 04/2020 s/p stents - monitor   Ophthalmic Meds Ordered this visit:  Meds ordered this encounter  Medications   aflibercept (EYLEA) SOLN 2 mg     Return in about 4 weeks (around 12/22/2021) for f/u PDR OS, DFE, OCT.  There are no Patient Instructions on file for this visit.  Explained the diagnoses, plan, and follow up with the patient and they expressed understanding.  Patient expressed understanding of the importance of proper follow up care.   This document serves as a record of services personally performed by Gardiner Sleeper, MD, PhD. It was created on their behalf by San Jetty. Owens Shark, OA an ophthalmic  technician. The creation of this record is the provider's dictation and/or activities during the visit.    Electronically signed by: San Jetty. Owens Shark, [place] 02.27.2023 1:11 PM  Gardiner Sleeper, M.D., Ph.D. Diseases & Surgery of the Retina and Vitreous [facility]  I have reviewed the above documentation for accuracy and completeness, and I agree with the above. Gardiner Sleeper, M.D., Ph.D. 11/24/21 1:12 PM   Abbreviations: M myopia (nearsighted); A astigmatism; H hyperopia (farsighted); P presbyopia; Mrx spectacle prescription;  CTL contact lenses; OD right eye; OS left eye; OU both eyes  XT exotropia; ET esotropia; PEK punctate epithelial keratitis; PEE punctate epithelial erosions; DES dry eye syndrome; MGD meibomian gland dysfunction; ATs artificial tears; PFAT's preservative free artificial tears; [place] nuclear sclerotic cataract; PSC posterior subcapsular cataract; ERM epi-retinal membrane; PVD posterior vitreous detachment; RD retinal detachment; DM diabetes mellitus; DR diabetic retinopathy; NPDR non-proliferative diabetic retinopathy; PDR proliferative diabetic retinopathy; CSME clinically significant macular edema; DME diabetic macular edema; dbh dot blot hemorrhages; CWS cotton wool spot; POAG primary open angle glaucoma; C/D cup-to-disc ratio; HVF humphrey visual field; GVF goldmann visual field; OCT optical coherence tomography; IOP intraocular pressure; BRVO Branch retinal vein occlusion; CRVO central retinal vein occlusion; CRAO central retinal artery occlusion; BRAO branch retinal artery occlusion; RT retinal tear; SB scleral buckle; PPV pars plana vitrectomy; VH Vitreous hemorrhage; PRP panretinal laser photocoagulation; IVK intravitreal kenalog; VMT vitreomacular traction;  MH Macular hole;  NVD neovascularization of the disc; NVE neovascularization elsewhere; AREDS age related eye disease study; ARMD age related macular degeneration; POAG primary open angle glaucoma;  EBMD epithelial/anterior basement membrane dystrophy; ACIOL anterior chamber intraocular lens; IOL intraocular lens; PCIOL posterior chamber intraocular lens; Phaco/IOL phacoemulsification with intraocular lens placement; [place] photorefractive keratectomy; LASIK laser assisted in situ keratomileusis; HTN hypertension; DM diabetes mellitus; COPD chronic obstructive pulmonary disease

## 2021-11-24 ENCOUNTER — Encounter (INDEPENDENT_AMBULATORY_CARE_PROVIDER_SITE_OTHER): Payer: Self-pay | Admitting: Ophthalmology

## 2021-11-24 ENCOUNTER — Ambulatory Visit (INDEPENDENT_AMBULATORY_CARE_PROVIDER_SITE_OTHER): Payer: BC Managed Care – PPO | Admitting: Ophthalmology

## 2021-11-24 ENCOUNTER — Encounter (INDEPENDENT_AMBULATORY_CARE_PROVIDER_SITE_OTHER): Payer: BC Managed Care – PPO | Admitting: Ophthalmology

## 2021-11-24 ENCOUNTER — Other Ambulatory Visit: Payer: Self-pay

## 2021-11-24 DIAGNOSIS — E113311 Type 2 diabetes mellitus with moderate nonproliferative diabetic retinopathy with macular edema, right eye: Secondary | ICD-10-CM | POA: Diagnosis not present

## 2021-11-24 DIAGNOSIS — I10 Essential (primary) hypertension: Secondary | ICD-10-CM | POA: Diagnosis not present

## 2021-11-24 DIAGNOSIS — E113512 Type 2 diabetes mellitus with proliferative diabetic retinopathy with macular edema, left eye: Secondary | ICD-10-CM

## 2021-11-24 DIAGNOSIS — H35033 Hypertensive retinopathy, bilateral: Secondary | ICD-10-CM

## 2021-11-24 MED ORDER — AFLIBERCEPT 2MG/0.05ML IZ SOLN FOR KALEIDOSCOPE
2.0000 mg | INTRAVITREAL | Status: AC | PRN
  Administered 2021-11-24: 2 mg via INTRAVITREAL

## 2021-12-08 ENCOUNTER — Other Ambulatory Visit: Payer: Self-pay | Admitting: Osteopathic Medicine

## 2021-12-19 NOTE — Progress Notes (Shared)
?[facility] Note ? ?12/22/2021 ? ?  ? ?CHIEF COMPLAINT ?Patient presents for No chief complaint on file. ? ? ? ?HISTORY OF PRESENT ILLNESS: ?Sarah Kane is a 51 y.o. female who presents to the clinic today for:  ? ? ?Pt states no change in vision ? ?Referring physician: ?Gevena Cotton MD ?[place], [place] [address] ? ?HISTORICAL INFORMATION:  ?Selected notes from the [place] ?Referred by Dr. Frederico Hamman for diabetic eye exam ?LEE: 12.278.21, BCVA OD: 20/50 OS: 20/70 ?Ocular Hx- DM, cataracts ?PMH- DM, cholesterol ?  ? ?CURRENT MEDICATIONS: ?No current outpatient medications on file. (Ophthalmic Drugs)  ? ?No current facility-administered medications for this visit. (Ophthalmic Drugs)  ? ?Current Outpatient Medications (Other)  ?Medication Sig  ? OZEMPIC, 1 MG/DOSE, 4 MG/3ML SOPN INJECT 1MG  INTO THE SKIN ONCE A WEEK  ? acetaminophen (TYLENOL) 500 MG tablet Take 1,000 mg by mouth every 6 (six) hours as needed for mild pain or headache.   ? aspirin 81 MG EC tablet Take 1 tablet (81 mg total) by mouth daily. Swallow whole.  ? atorvastatin (LIPITOR) 80 MG tablet Take 1 tablet (80 mg total) by mouth daily.  ? escitalopram (LEXAPRO) 20 MG tablet Take 1 tablet (20 mg total) by mouth daily. NO REFILLS. NEEDS TO TRANSFER CARE TO NEW PCP.  ? gabapentin (NEURONTIN) 300 MG capsule Take 1 capsule (300 mg total) by mouth 3 (three) times daily.  ? glimepiride (AMARYL) 4 MG tablet TAKE 2 TABLETS BY MOUTH ONCE DAILY WITH BREAKFAST. Must establish with new PCP for future refills.  ? isosorbide mononitrate (IMDUR) 30 MG 24 hr tablet TAKE 1 & 1/2 (ONE & ONE-HALF) TABLETS BY MOUTH ONCE DAILY  ? metoprolol succinate (TOPROL-XL) 25 MG 24 hr tablet Take 1 tablet (25 mg total) by mouth 2 (two) times daily with a meal.  ? Multiple Vitamins-Minerals (MULTIVITAMIN WOMEN PO) Take 1 tablet by mouth 3 (three) times a week.   ? prazosin (MINIPRESS) 2 MG capsule Take 1 capsule (2 mg total) by  mouth at bedtime.  ? sitaGLIPtin (JANUVIA) 100 MG tablet Take 1 tablet (100 mg total) by mouth daily.  ? ticagrelor (BRILINTA) 90 MG TABS tablet Take 1 tablet (90 mg total) by mouth 2 (two) times daily.  ? traMADol-acetaminophen (ULTRACET) 37.5-325 MG tablet Take 1-2 tablets by mouth every 6 (six) hours as needed.  ? Vitamin D, Ergocalciferol, (DRISDOL) 1.25 MG (50000 UNIT) CAPS capsule Take 1 capsule by mouth once a week  ? ?No current facility-administered medications for this visit. (Other)  ? ?REVIEW OF SYSTEMS: ? ? ?ALLERGIES ?Allergies  ?Allergen Reactions  ? Nitroglycerin   ?  Blood pressure dropped, was hospitalized   ? ?PAST MEDICAL HISTORY ?Past Medical History:  ?Diagnosis Date  ? Arthritis   ? Coronary artery disease   ? COVID-19 05/2020  ? Diabetes mellitus without complication ([place])   ? Diabetic retinopathy ([place])   ? Hyperlipidemia   ? Hypertension   ? Hypertensive retinopathy   ? Myocardial infarct, old   ? ?Past Surgical History:  ?Procedure Laterality Date  ? CARDIAC CATHETERIZATION    ? [place]  ? CORONARY STENT INTERVENTION N/A 05/20/2020  ? Procedure: CORONARY STENT INTERVENTION;  Surgeon: Burnell Blanks, MD;  Location: [facility];  Service: Cardiovascular;  Laterality: N/A;  ? CORONARY STENT INTERVENTION N/A 07/22/2020  ? Procedure: CORONARY STENT INTERVENTION;  Surgeon: Burnell Blanks, MD;  Location:  [facility];  Service: Cardiovascular;  Laterality: N/A;  ? LEFT HEART CATH AND CORONARY ANGIOGRAPHY N/A 05/20/2020  ? Procedure: LEFT HEART CATH AND CORONARY ANGIOGRAPHY;  Surgeon: Burnell Blanks, MD;  Location: [facility];  Service: Cardiovascular;  Laterality: N/A;  ? TRIGGER FINGER RELEASE Right   ? thumb  ? ?FAMILY HISTORY ?Family History  ?Problem Relation Age of Onset  ? High blood pressure Mother   ? Diabetes Mother   ? Diabetes Maternal Grandmother   ? High blood pressure Maternal Grandmother   ? Heart attack Maternal  Grandfather   ? Diabetes Maternal Grandfather   ? High blood pressure Maternal Grandfather   ? High blood pressure Paternal Grandmother   ? High blood pressure Paternal Grandfather   ? Diabetes Maternal Aunt   ? Diabetes Maternal Uncle   ? Skin cancer Maternal Uncle   ? ?SOCIAL HISTORY ?Social History  ? ?Tobacco Use  ? Smoking status: Never  ? Smokeless tobacco: Never  ?Vaping Use  ? Vaping Use: Never used  ?Substance Use Topics  ? Alcohol use: No  ? Drug use: No  ?  ? ?  ?OPHTHALMIC EXAM: ?Not recorded ?  ? ?IMAGING AND PROCEDURES  ?Imaging and Procedures for 12/22/2021 ? ? ?  ?  ? ?  ?ASSESSMENT/PLAN: ? ?  ICD-10-CM   ?1. 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  ?2. Moderate nonproliferative diabetic retinopathy of right eye with macular edema associated with type 2 diabetes mellitus ([place])  [id number]   ?  ?3. Essential hypertension  I10   ?  ?4. Hypertensive retinopathy of both eyes  H35.033   ?  ? ?1. Proliferative diabetic retinopathy w/ DME, OS ? - FA (02.02.22) shows focal NV nasal periphery OS ?- s/p IVA OS #1 (02.02.22), #2 (03.04.22), #3 (04.01.22), #4 (05.12.22), #5 (06.09.22), #6 (07.21.22), #7 (08.18.22), #8 (09.15.22), #9 (10.13.22), #10 (11.10.22), #11 (12.08.22), #12 (01.05.23) ?- s/p IVE OS #1 (02.02.23, sample), #2 (03.02.23) ? - s/p PRP OS (02.18.22) ?- BCVA 20/25 OS ? - OCT shows OS: Interval improvement in cystic changes temporal macula and fovea  ?- recommend IVE OS #3 today, 03.29.23 for DME with f/u in 4 weeks ?- pt wishes to proceed ?- RBA of procedure discussed, questions answered ?- IVA informed consent obtained and signed, 02.02.22 (OS) ?- IVE informed consent obtained and signed, 02.02.23 (OS) ?- see procedure note ?- f/u 4 weeks DFE, OCT, possible injection  ? ?2. Severe Non-proliferative diabetic retinopathy, OD ?- FA (02.02.22) shows no NV OD ?- s/p Focal laser OD (05.27.22) for DME ?- BCVA 20/20  OD ?- OCT shows OD: Trace cystic changes greatest temporal macula - improved from prior ?- exam shows scattered [place] ?- no treatment at this time ?- f/u 4 weeks, DFE, OCT ? ?3,4. Hypertensive retinopathy OU ?- discussed importance of tight BP control ?- history of MI 04/2020 s/p stents ?- monitor  ? ?Ophthalmic Meds Ordered this visit:  ?No orders of the defined types were placed in this encounter. ?  ? ?No follow-ups on file. ? ?There are no Patient Instructions on file for this visit. ? ?Explained the diagnoses, plan, and follow up with the patient and they expressed understanding.  Patient expressed understanding of the importance of proper follow up care.  ? ?This document serves as a record of services personally performed by Gardiner Sleeper, MD, PhD. It  was created on their behalf by San Jetty. Owens Shark, OA an ophthalmic technician. The creation of this record is the provider's dictation and/or activities during the visit.   ? ?Electronically signed by: San Jetty. Owens Shark, [place] 03.27.2023 1:37 PM ? ? ?Gardiner Sleeper, M.D., Ph.D. ?Diseases & Surgery of the Retina and Vitreous ?[place] ? ? ? ? ?Abbreviations: ?M myopia (nearsighted); A astigmatism; H hyperopia (farsighted); P presbyopia; Mrx spectacle prescription;  CTL contact lenses; OD right eye; OS left eye; OU both eyes  XT exotropia; ET esotropia; PEK punctate epithelial keratitis; PEE punctate epithelial erosions; DES dry eye syndrome; MGD meibomian gland dysfunction; ATs artificial tears; PFAT's preservative free artificial tears; [place] nuclear sclerotic cataract; PSC posterior subcapsular cataract; ERM epi-retinal membrane; PVD posterior vitreous detachment; RD retinal detachment; DM diabetes mellitus; DR diabetic retinopathy; NPDR non-proliferative diabetic retinopathy; PDR proliferative diabetic retinopathy; CSME clinically significant macular edema; DME diabetic macular edema; dbh dot blot hemorrhages; CWS cotton wool spot; POAG  primary open angle glaucoma; C/D cup-to-disc ratio; HVF humphrey visual field; GVF goldmann visual field; OCT optical coherence tomography; IOP intraocular pressure; BRVO Branch retinal vein occlusion; CRVO central reti

## 2021-12-22 ENCOUNTER — Encounter (INDEPENDENT_AMBULATORY_CARE_PROVIDER_SITE_OTHER): Payer: BC Managed Care – PPO | Admitting: Ophthalmology

## 2021-12-22 DIAGNOSIS — E113512 Type 2 diabetes mellitus with proliferative diabetic retinopathy with macular edema, left eye: Secondary | ICD-10-CM

## 2021-12-22 DIAGNOSIS — H35033 Hypertensive retinopathy, bilateral: Secondary | ICD-10-CM

## 2021-12-22 DIAGNOSIS — I10 Essential (primary) hypertension: Secondary | ICD-10-CM

## 2021-12-22 DIAGNOSIS — E113311 Type 2 diabetes mellitus with moderate nonproliferative diabetic retinopathy with macular edema, right eye: Secondary | ICD-10-CM

## 2021-12-30 NOTE — Progress Notes (Shared)
?[facility] Note ? ?01/03/2022 ? ?  ? ?CHIEF COMPLAINT ?Patient presents for No chief complaint on file. ? ? ? ?HISTORY OF PRESENT ILLNESS: ?Sarah Kane is a 51 y.o. female who presents to the clinic today for:  ? ? ?Pt states no change in vision ? ?Referring physician: ?Gevena Cotton MD ?[place], [place] [address] ? ?HISTORICAL INFORMATION:  ?Selected notes from the [place] ?Referred by Dr. Frederico Hamman for diabetic eye exam ?LEE: 12.278.21, BCVA OD: 20/50 OS: 20/70 ?Ocular Hx- DM, cataracts ?PMH- DM, cholesterol ?  ? ?CURRENT MEDICATIONS: ?No current outpatient medications on file. (Ophthalmic Drugs)  ? ?No current facility-administered medications for this visit. (Ophthalmic Drugs)  ? ?Current Outpatient Medications (Other)  ?Medication Sig  ? OZEMPIC, 1 MG/DOSE, 4 MG/3ML SOPN INJECT 1MG  INTO THE SKIN ONCE A WEEK  ? acetaminophen (TYLENOL) 500 MG tablet Take 1,000 mg by mouth every 6 (six) hours as needed for mild pain or headache.   ? aspirin 81 MG EC tablet Take 1 tablet (81 mg total) by mouth daily. Swallow whole.  ? atorvastatin (LIPITOR) 80 MG tablet Take 1 tablet (80 mg total) by mouth daily.  ? escitalopram (LEXAPRO) 20 MG tablet Take 1 tablet (20 mg total) by mouth daily. NO REFILLS. NEEDS TO TRANSFER CARE TO NEW PCP.  ? gabapentin (NEURONTIN) 300 MG capsule Take 1 capsule (300 mg total) by mouth 3 (three) times daily.  ? glimepiride (AMARYL) 4 MG tablet TAKE 2 TABLETS BY MOUTH ONCE DAILY WITH BREAKFAST. Must establish with new PCP for future refills.  ? isosorbide mononitrate (IMDUR) 30 MG 24 hr tablet TAKE 1 & 1/2 (ONE & ONE-HALF) TABLETS BY MOUTH ONCE DAILY  ? metoprolol succinate (TOPROL-XL) 25 MG 24 hr tablet Take 1 tablet (25 mg total) by mouth 2 (two) times daily with a meal.  ? Multiple Vitamins-Minerals (MULTIVITAMIN WOMEN PO) Take 1 tablet by mouth 3 (three) times a week.   ? prazosin (MINIPRESS) 2 MG capsule Take 1 capsule (2 mg total) by  mouth at bedtime.  ? sitaGLIPtin (JANUVIA) 100 MG tablet Take 1 tablet (100 mg total) by mouth daily.  ? ticagrelor (BRILINTA) 90 MG TABS tablet Take 1 tablet (90 mg total) by mouth 2 (two) times daily.  ? traMADol-acetaminophen (ULTRACET) 37.5-325 MG tablet Take 1-2 tablets by mouth every 6 (six) hours as needed.  ? Vitamin D, Ergocalciferol, (DRISDOL) 1.25 MG (50000 UNIT) CAPS capsule Take 1 capsule by mouth once a week  ? ?No current facility-administered medications for this visit. (Other)  ? ?REVIEW OF SYSTEMS: ? ? ?ALLERGIES ?Allergies  ?Allergen Reactions  ? Nitroglycerin   ?  Blood pressure dropped, was hospitalized   ? ?PAST MEDICAL HISTORY ?Past Medical History:  ?Diagnosis Date  ? Arthritis   ? Coronary artery disease   ? COVID-19 05/2020  ? Diabetes mellitus without complication ([place])   ? Diabetic retinopathy ([place])   ? Hyperlipidemia   ? Hypertension   ? Hypertensive retinopathy   ? Myocardial infarct, old   ? ?Past Surgical History:  ?Procedure Laterality Date  ? CARDIAC CATHETERIZATION    ? [place]  ? CORONARY STENT INTERVENTION N/A 05/20/2020  ? Procedure: CORONARY STENT INTERVENTION;  Surgeon: Burnell Blanks, MD;  Location: [facility];  Service: Cardiovascular;  Laterality: N/A;  ? CORONARY STENT INTERVENTION N/A 07/22/2020  ? Procedure: CORONARY STENT INTERVENTION;  Surgeon: Burnell Blanks, MD;  Location:  [facility];  Service: Cardiovascular;  Laterality: N/A;  ? LEFT HEART CATH AND CORONARY ANGIOGRAPHY N/A 05/20/2020  ? Procedure: LEFT HEART CATH AND CORONARY ANGIOGRAPHY;  Surgeon: Burnell Blanks, MD;  Location: [facility];  Service: Cardiovascular;  Laterality: N/A;  ? TRIGGER FINGER RELEASE Right   ? thumb  ? ?FAMILY HISTORY ?Family History  ?Problem Relation Age of Onset  ? High blood pressure Mother   ? Diabetes Mother   ? Diabetes Maternal Grandmother   ? High blood pressure Maternal Grandmother   ? Heart attack Maternal  Grandfather   ? Diabetes Maternal Grandfather   ? High blood pressure Maternal Grandfather   ? High blood pressure Paternal Grandmother   ? High blood pressure Paternal Grandfather   ? Diabetes Maternal Aunt   ? Diabetes Maternal Uncle   ? Skin cancer Maternal Uncle   ? ?SOCIAL HISTORY ?Social History  ? ?Tobacco Use  ? Smoking status: Never  ? Smokeless tobacco: Never  ?Vaping Use  ? Vaping Use: Never used  ?Substance Use Topics  ? Alcohol use: No  ? Drug use: No  ?  ? ?  ?OPHTHALMIC EXAM: ?Not recorded ?  ? ?IMAGING AND PROCEDURES  ?Imaging and Procedures for 01/03/2022 ? ? ?  ?  ? ?  ?ASSESSMENT/PLAN: ? ?  ICD-10-CM   ?1. Proliferative diabetic retinopathy of left eye with macular edema associated with type 2 diabetes mellitus ([place])  [id number]   ?  ?2. Moderate nonproliferative diabetic retinopathy of right eye with macular edema associated with type 2 diabetes mellitus ([place])  [id number]   ?  ?3. Essential hypertension  I10   ?  ?4. Hypertensive retinopathy of both eyes  H35.033   ?  ? ? ?1. Proliferative diabetic retinopathy w/ DME, OS ? - FA (02.02.22) shows focal NV nasal periphery OS ?- s/p IVA OS #1 (02.02.22), #2 (03.04.22), #3 (04.01.22), #4 (05.12.22), #5 (06.09.22), #6 (07.21.22), #7 (08.18.22), #8 (09.15.22), #9 (10.13.22), #10 (11.10.22), #11 (12.08.22), #12 (01.05.23) ?- s/p IVE OS #1 (02.02.23, sample), #2 (03.02.23) ? - s/p PRP OS (02.18.22) ?- BCVA 20/25 OS ? - OCT shows OS: Interval improvement in cystic changes temporal macula and fovea  ?- recommend IVE OS #3 today, 04.11.23 for DME with f/u in 4 weeks ?- pt wishes to proceed ?- RBA of procedure discussed, questions answered ?- IVA informed consent obtained and signed, 02.02.22 (OS) ?- IVE informed consent obtained and signed, 02.02.23 (OS) ?- see procedure note ?- f/u 4 weeks DFE, OCT, possible injection  ? ?2. Severe Non-proliferative diabetic retinopathy, OD ?- FA (02.02.22) shows no NV OD ?- s/p Focal laser OD (05.27.22) for DME ?- BCVA 20/20  OD ?- OCT shows OD: Trace cystic changes greatest temporal macula - improved from prior  ?- exam shows scattered [place] ?- no treatment at this time ?- f/u 4 weeks, DFE, OCT ? ?3,4. Hypertensive retinopathy OU ?- discussed importance of tight BP control ?- history of MI 04/2020 s/p stents ?- monitor  ? ?Ophthalmic Meds Ordered this visit:  ?No orders of the defined types were placed in this encounter. ?  ? ?No follow-ups on file. ? ?There are no Patient Instructions on file for this visit. ? ?Explained the diagnoses, plan, and follow up with the patient and they expressed understanding.  Patient expressed understanding of the importance of proper follow up care.  ? ?This document serves as a record of services personally performed by Gardiner Sleeper, MD,  PhD. It was created on their behalf by Leonie Douglas, an ophthalmic technician. The creation of this record is the provider's dictation and/or activities during the visit.   ? ?Electronically signed by: Leonie Douglas COA, 12/30/21  2:47 PM ? ? ?Gardiner Sleeper, M.D., Ph.D. ?Diseases & Surgery of the Retina and Vitreous ?[place] ? ? ? ?Abbreviations: ?M myopia (nearsighted); A astigmatism; H hyperopia (farsighted); P presbyopia; Mrx spectacle prescription;  CTL contact lenses; OD right eye; OS left eye; OU both eyes  XT exotropia; ET esotropia; PEK punctate epithelial keratitis; PEE punctate epithelial erosions; DES dry eye syndrome; MGD meibomian gland dysfunction; ATs artificial tears; PFAT's preservative free artificial tears; [place] nuclear sclerotic cataract; PSC posterior subcapsular cataract; ERM epi-retinal membrane; PVD posterior vitreous detachment; RD retinal detachment; DM diabetes mellitus; DR diabetic retinopathy; NPDR non-proliferative diabetic retinopathy; PDR proliferative diabetic retinopathy; CSME clinically significant macular edema; DME diabetic macular edema; dbh dot blot hemorrhages; CWS cotton wool spot; POAG primary open  angle glaucoma; C/D cup-to-disc ratio; HVF humphrey visual field; GVF goldmann visual field; OCT optical coherence tomography; IOP intraocular pressure; BRVO Branch retinal vein occlusion; CRVO central retinal vein
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
## 2022-01-02 ENCOUNTER — Telehealth: Payer: Self-pay

## 2022-01-02 ENCOUNTER — Ambulatory Visit (INDEPENDENT_AMBULATORY_CARE_PROVIDER_SITE_OTHER): Payer: BC Managed Care – PPO | Admitting: Medical-Surgical

## 2022-01-02 ENCOUNTER — Encounter: Payer: Self-pay | Admitting: Medical-Surgical

## 2022-01-02 ENCOUNTER — Encounter (INDEPENDENT_AMBULATORY_CARE_PROVIDER_SITE_OTHER): Payer: BC Managed Care – PPO | Admitting: Ophthalmology

## 2022-01-02 VITALS — BP 117/83 | HR 70 | Resp 20 | Ht 60.0 in | Wt 198.5 lb

## 2022-01-02 DIAGNOSIS — E1165 Type 2 diabetes mellitus with hyperglycemia: Secondary | ICD-10-CM

## 2022-01-02 DIAGNOSIS — I10 Essential (primary) hypertension: Secondary | ICD-10-CM

## 2022-01-02 DIAGNOSIS — E785 Hyperlipidemia, unspecified: Secondary | ICD-10-CM

## 2022-01-02 DIAGNOSIS — G894 Chronic pain syndrome: Secondary | ICD-10-CM

## 2022-01-02 DIAGNOSIS — F418 Other specified anxiety disorders: Secondary | ICD-10-CM

## 2022-01-02 DIAGNOSIS — Z7689 Persons encountering health services in other specified circumstances: Secondary | ICD-10-CM | POA: Diagnosis not present

## 2022-01-02 LAB — POCT GLYCOSYLATED HEMOGLOBIN (HGB A1C): HbA1c, POC (controlled diabetic range): 9.7 % — AB (ref 0.0–7.0)

## 2022-01-02 LAB — POCT UA - MICROALBUMIN
Albumin/Creatinine Ratio, Urine, POC: <30
Creatinine, POC: 300 mg/dL
Microalbumin Ur, POC: 10 mg/L

## 2022-01-02 MED ORDER — GABAPENTIN 300 MG PO CAPS: 300.0000 mg | ORAL_CAPSULE | Freq: Three times a day (TID) | ORAL | 1 refills | Status: DC

## 2022-01-02 MED ORDER — RYBELSUS 7 MG PO TABS: 7.0000 mg | ORAL_TABLET | Freq: Every day | ORAL | 0 refills | Status: DC

## 2022-01-02 MED ORDER — GLIMEPIRIDE 4 MG PO TABS: ORAL_TABLET | ORAL | 1 refills | Status: DC

## 2022-01-02 MED ORDER — ESCITALOPRAM OXALATE 20 MG PO TABS: 20.0000 mg | ORAL_TABLET | Freq: Every day | ORAL | 1 refills | Status: DC

## 2022-01-02 MED ORDER — PRAZOSIN HCL 2 MG PO CAPS: 2.0000 mg | ORAL_CAPSULE | Freq: Every day | ORAL | 1 refills | Status: DC

## 2022-01-02 MED ORDER — SITAGLIPTIN PHOSPHATE 100 MG PO TABS: 100.0000 mg | ORAL_TABLET | Freq: Every day | ORAL | 1 refills | Status: DC

## 2022-01-02 NOTE — Telephone Encounter (Signed)
Initiated Prior authorization [id number] 7MG  tablets ?Via: Covermymeds ?Case/Key:[id number] ?Status: approved  as of 01/02/22 ?Reason:approved through 01/03/2023. ?Notified Pt via: Mychart ?

## 2022-01-02 NOTE — Addendum Note (Signed)
Addended by: Latanya Presser on: 01/02/2022 11:43 AM ? ? Modules accepted: Orders ? ?

## 2022-01-03 ENCOUNTER — Ambulatory Visit (INDEPENDENT_AMBULATORY_CARE_PROVIDER_SITE_OTHER): Payer: BC Managed Care – PPO | Admitting: Ophthalmology

## 2022-01-03 ENCOUNTER — Encounter (INDEPENDENT_AMBULATORY_CARE_PROVIDER_SITE_OTHER): Payer: Self-pay

## 2022-01-03 ENCOUNTER — Encounter (INDEPENDENT_AMBULATORY_CARE_PROVIDER_SITE_OTHER): Payer: Self-pay | Admitting: Ophthalmology

## 2022-01-03 ENCOUNTER — Encounter (INDEPENDENT_AMBULATORY_CARE_PROVIDER_SITE_OTHER): Payer: BC Managed Care – PPO | Admitting: Ophthalmology

## 2022-01-03 DIAGNOSIS — E113512 Type 2 diabetes mellitus with proliferative diabetic retinopathy with macular edema, left eye: Secondary | ICD-10-CM

## 2022-01-03 DIAGNOSIS — I10 Essential (primary) hypertension: Secondary | ICD-10-CM

## 2022-01-03 DIAGNOSIS — H35033 Hypertensive retinopathy, bilateral: Secondary | ICD-10-CM

## 2022-01-03 DIAGNOSIS — E113311 Type 2 diabetes mellitus with moderate nonproliferative diabetic retinopathy with macular edema, right eye: Secondary | ICD-10-CM | POA: Diagnosis not present

## 2022-01-03 LAB — CBC WITH DIFFERENTIAL/PLATELET
Absolute Monocytes: 639 cells/uL (ref 200–950)
Basophils Absolute: 33 cells/uL (ref 0–200)
Basophils Relative: 0.4 %
Eosinophils Absolute: 133 cells/uL (ref 15–500)
Eosinophils Relative: 1.6 %
HCT: 45.1 % — ABNORMAL HIGH (ref 35.0–45.0)
Hemoglobin: 14.4 g/dL (ref 11.7–15.5)
Lymphs Abs: 1826 cells/uL (ref 850–3900)
MCH: 28.6 pg (ref 27.0–33.0)
MCHC: 31.9 g/dL — ABNORMAL LOW (ref 32.0–36.0)
MCV: 89.7 fL (ref 80.0–100.0)
MPV: 13 fL — ABNORMAL HIGH (ref 7.5–12.5)
Monocytes Relative: 7.7 %
Neutro Abs: 5669 cells/uL (ref 1500–7800)
Neutrophils Relative %: 68.3 %
Platelets: 205 10*3/uL (ref 140–400)
RBC: 5.03 10*6/uL (ref 3.80–5.10)
RDW: 12.5 % (ref 11.0–15.0)
Total Lymphocyte: 22 %
WBC: 8.3 10*3/uL (ref 3.8–10.8)

## 2022-01-03 LAB — COMPLETE METABOLIC PANEL WITH GFR
AG Ratio: 1.6 (calc) (ref 1.0–2.5)
ALT: 27 U/L (ref 6–29)
AST: 17 U/L (ref 10–35)
Albumin: 4.3 g/dL (ref 3.6–5.1)
Alkaline phosphatase (APISO): 79 U/L (ref 37–153)
BUN: 16 mg/dL (ref 7–25)
CO2: 30 mmol/L (ref 20–32)
Calcium: 9.8 mg/dL (ref 8.6–10.4)
Chloride: 99 mmol/L (ref 98–110)
Creat: 0.83 mg/dL (ref 0.50–1.03)
Globulin: 2.7 g/dL (calc) (ref 1.9–3.7)
Glucose, Bld: 269 mg/dL — ABNORMAL HIGH (ref 65–99)
Potassium: 5 mmol/L (ref 3.5–5.3)
Sodium: 138 mmol/L (ref 135–146)
Total Bilirubin: 0.4 mg/dL (ref 0.2–1.2)
Total Protein: 7 g/dL (ref 6.1–8.1)
eGFR: 85 mL/min/{1.73_m2} (ref >=60)

## 2022-01-03 LAB — LIPID PANEL
Cholesterol: 154 mg/dL (ref <200)
HDL: 56 mg/dL (ref >=50)
LDL Cholesterol (Calc): 69 mg/dL (calc)
Non-HDL Cholesterol (Calc): 98 mg/dL (calc) (ref <130)
Total CHOL/HDL Ratio: 2.8 (calc) (ref <5.0)
Triglycerides: 236 mg/dL — ABNORMAL HIGH (ref <150)

## 2022-01-03 MED ORDER — AFLIBERCEPT 2MG/0.05ML IZ SOLN FOR KALEIDOSCOPE
2.0000 mg | INTRAVITREAL | Status: AC | PRN
  Administered 2022-01-03: 2 mg via INTRAVITREAL

## 2022-01-03 NOTE — Progress Notes (Signed)
?[facility] Note ? ?01/03/2022 ? ?  ? ?CHIEF COMPLAINT ?Patient presents for Retina Follow Up ? ? ? ?HISTORY OF PRESENT ILLNESS: ?Sarah Kane is a 51 y.o. female who presents to the clinic today for:  ? ?HPI   ? ? Retina Follow Up   ?Patient presents with  Diabetic Retinopathy.  In both eyes.  Duration of 5 weeks.  Since onset it is stable.  I, the attending physician,  performed the HPI with the patient and updated documentation appropriately. ? ?  ?  ? ? Comments   ?5 1/2 week follow up PDR OS-  Vision appears stable OU.  ?BS unsure A1C 9- see seen PCP yesterday another medication added (hasn't started yet)   Went from 11 to 7 now 9. ? ?  ?  ?Last edited by Rennis Chris, MD on 01/03/2022  4:18 PM.  ?  ?Pt states no change in vision ? ?Referring physician: ?Aura Camps MD ?[address] ?[place], [place] [address] ? ?HISTORICAL INFORMATION:  ?Selected notes from the MEDICAL RECORD NUMBER ?Referred by Dr. Karleen Hampshire for diabetic eye exam ?LEE: 12.278.21, BCVA OD: 20/50 OS: 20/70 ?Ocular Hx- DM, cataracts ?PMH- DM, cholesterol ?  ? ?CURRENT MEDICATIONS: ?No current outpatient medications on file. (Ophthalmic Drugs)  ? ?No current facility-administered medications for this visit. (Ophthalmic Drugs)  ? ?Current Outpatient Medications (Other)  ?Medication Sig  ? acetaminophen (TYLENOL) 500 MG tablet Take 1,000 mg by mouth every 6 (six) hours as needed for mild pain or headache.   ? aspirin 81 MG EC tablet Take 1 tablet (81 mg total) by mouth daily. Swallow whole.  ? atorvastatin (LIPITOR) 80 MG tablet Take 1 tablet (80 mg total) by mouth daily.  ? escitalopram (LEXAPRO) 20 MG tablet Take 1 tablet (20 mg total) by mouth daily.  ? gabapentin (NEURONTIN) 300 MG capsule Take 1 capsule (300 mg total) by mouth 3 (three) times daily.  ? glimepiride (AMARYL) 4 MG tablet TAKE 2 TABLETS BY MOUTH ONCE DAILY WITH BREAKFAST.  ? isosorbide mononitrate (IMDUR) 30 MG 24 hr tablet TAKE 1 & 1/2 (ONE &  ONE-HALF) TABLETS BY MOUTH ONCE DAILY  ? metoprolol succinate (TOPROL-XL) 25 MG 24 hr tablet Take 1 tablet (25 mg total) by mouth 2 (two) times daily with a meal.  ? Multiple Vitamins-Minerals (MULTIVITAMIN WOMEN PO) Take 1 tablet by mouth 3 (three) times a week.   ? prazosin (MINIPRESS) 2 MG capsule Take 1 capsule (2 mg total) by mouth at bedtime.  ? Semaglutide (RYBELSUS) 7 MG TABS Take 7 mg by mouth daily.  ? sitaGLIPtin (JANUVIA) 100 MG tablet Take 1 tablet (100 mg total) by mouth daily.  ? ticagrelor (BRILINTA) 90 MG TABS tablet Take 1 tablet (90 mg total) by mouth 2 (two) times daily.  ? traMADol-acetaminophen (ULTRACET) 37.5-325 MG tablet Take 1-2 tablets by mouth every 6 (six) hours as needed.  ? Vitamin D, Ergocalciferol, (DRISDOL) 1.25 MG (50000 UNIT) CAPS capsule Take 1 capsule by mouth once a week  ? ?No current facility-administered medications for this visit. (Other)  ? ?REVIEW OF SYSTEMS: ?ROS   ?Positive for: Neurological, Musculoskeletal, Endocrine, Cardiovascular, Eyes ?Negative for: Constitutional, Gastrointestinal, Skin, Genitourinary, HENT, Respiratory, Psychiatric, Allergic/Imm, Heme/Lymph ?Last edited by Joni Reining, COA on 01/03/2022  3:09 PM.  ?  ? ?ALLERGIES ?Allergies  ?Allergen Reactions  ? Nitroglycerin   ?  Blood pressure dropped, was hospitalized   ? ?PAST MEDICAL HISTORY ?Past Medical History:  ?  Diagnosis Date  ? Arthritis   ? Coronary artery disease   ? COVID-19 05/2020  ? Diabetes mellitus without complication ([facility])   ? Diabetic retinopathy ([facility])   ? Hyperlipidemia   ? Hypertension   ? Hypertensive retinopathy   ? Myocardial infarct, old   ? ?Past Surgical History:  ?Procedure Laterality Date  ? CARDIAC CATHETERIZATION    ? CESAREAN SECTION  1990  ? CORONARY STENT INTERVENTION N/A 05/20/2020  ? Procedure: CORONARY STENT INTERVENTION;  Surgeon: Kathleene HazelMcAlhany, Christopher D, MD;  Location: [facility];  Service: Cardiovascular;  Laterality: N/A;  ? CORONARY STENT INTERVENTION N/A  07/22/2020  ? Procedure: CORONARY STENT INTERVENTION;  Surgeon: Kathleene HazelMcAlhany, Christopher D, MD;  Location: [facility];  Service: Cardiovascular;  Laterality: N/A;  ? LEFT HEART CATH AND CORONARY ANGIOGRAPHY N/A 05/20/2020  ? Procedure: LEFT HEART CATH AND CORONARY ANGIOGRAPHY;  Surgeon: Kathleene HazelMcAlhany, Christopher D, MD;  Location: [facility];  Service: Cardiovascular;  Laterality: N/A;  ? TRIGGER FINGER RELEASE Right   ? thumb  ? ?FAMILY HISTORY ?Family History  ?Problem Relation Age of Onset  ? High blood pressure Mother   ? Diabetes Mother   ? Diabetes Maternal Grandmother   ? High blood pressure Maternal Grandmother   ? Heart attack Maternal Grandfather   ? Diabetes Maternal Grandfather   ? High blood pressure Maternal Grandfather   ? High blood pressure Paternal Grandmother   ? High blood pressure Paternal Grandfather   ? Diabetes Maternal Aunt   ? Diabetes Maternal Uncle   ? Skin cancer Maternal Uncle   ? ?SOCIAL HISTORY ?Social History  ? ?Tobacco Use  ? Smoking status: Never  ? Smokeless tobacco: Never  ?Vaping Use  ? Vaping Use: Never used  ?Substance Use Topics  ? Alcohol use: No  ? Drug use: No  ?  ? ?  ?OPHTHALMIC EXAM: ?Base Eye Exam   ? ? Visual Acuity (Snellen - Linear)   ? ?   Right Left  ? Dist [place] 20/50 +2 20/50 -2  ? Dist ph [place] 20/25 +2 20/30 +2  ? ?  ?  ? ? Tonometry (Tonopen, 3:17 PM)   ? ?   Right Left  ? Pressure 18 13  ? ?  ?  ? ? Pupils   ? ?   Dark Light Shape React APD  ? Right 4 3 Round Brisk None  ? Left 4 3 Round Brisk None  ? ?  ?  ? ? Visual Fields (Counting fingers)   ? ?   Left Right  ?  Full Full  ? ?  ?  ? ? Extraocular Movement   ? ?   Right Left  ?  Full Full  ? ?  ?  ? ? Neuro/Psych   ? ? Oriented x3: Yes  ? Mood/Affect: Normal  ? ?  ?  ? ? Dilation   ? ? Both eyes: 1.0% Mydriacyl, 2.5% Phenylephrine @ 3:17 PM  ? ?  ?  ? ?  ? ?Slit Lamp and Fundus Exam   ? ? Slit Lamp Exam   ? ?   Right Left  ? Lids/Lashes Dermatochalasis - upper lid, Meibomian gland dysfunction, mild  Telangiectasia Dermatochalasis - upper lid, Meibomian gland dysfunction, mild Telangiectasia  ? Conjunctiva/Sclera White and quiet White and quiet  ? Cornea Trace inferior Punctate epithelial erosions, tear film debris Trace inferior Punctate epithelial erosions, tear film debris  ? Anterior Chamber deep, clear, narrow  temporal angle deep, clear, narrow temporal angle  ? Iris Round and dilated, No NVI Round and dilated, No NVI  ? Lens Cortical spokes Cortical spokes  ? Anterior Vitreous trace Vitreous syneresis trace Vitreous syneresis  ? ?  ?  ? ? Fundus Exam   ? ?   Right Left  ? Disc Pink and Sharp, Compact Pink and Sharp, Compact  ? C/D Ratio 0.2 0.2  ? Macula Flat, good foveal reflex, scattered MA/DBH greatest temporal macula, trace cystic changes - slightly improved, mild focal laser changes Blunted foveal reflex, scattered IRH/DBH - improving, +edema temporal macula -- slightly improved  ? Vessels attenuated, mild tortuousity early NV nasal to disc - regressed, attenuated, mild tortuousity, mild Copper wiring, AV crossing changes  ? Periphery Attached, scattered MA/DBH greatest posteriorly Attached, scattered MA/DBH greatest posteriorly and nasally, good 360 PRP changes  ? ?  ?  ? ?  ? ?IMAGING AND PROCEDURES  ?Imaging and Procedures for 01/03/2022 ? ?OCT, Retina - OU - Both Eyes   ? ?   ?Right Eye ?Quality was good. Central Foveal Thickness: 221. Progression has been stable. Findings include normal foveal contour, no SRF, intraretinal hyper-reflective material, no IRF (Trace cystic changes greatest temporal macula - persistent ).  ? ?Left Eye ?Quality was good. Central Foveal Thickness: 234. Progression has improved. Findings include intraretinal fluid, no SRF, abnormal foveal contour (Interval improvement in cystic changes temporal macula and fovea ).  ? ?Notes ?*Images captured and stored on drive ? ?Diagnosis / Impression:  ?DME OU (OS>OD) ?OD: Trace cystic changes greatest temporal macula - persistent   ?OS: Interval improvement in cystic changes temporal macula and fovea  ? ? ?Clinical management:  ?See below ? ?Abbreviations: NFP - Normal foveal profile. CME - cystoid macular edema. PED - pigment epithelial det

## 2022-01-05 ENCOUNTER — Encounter (INDEPENDENT_AMBULATORY_CARE_PROVIDER_SITE_OTHER): Payer: BC Managed Care – PPO | Admitting: Ophthalmology

## 2022-01-18 ENCOUNTER — Emergency Department (HOSPITAL_BASED_OUTPATIENT_CLINIC_OR_DEPARTMENT_OTHER): Payer: BC Managed Care – PPO

## 2022-01-18 ENCOUNTER — Emergency Department (HOSPITAL_BASED_OUTPATIENT_CLINIC_OR_DEPARTMENT_OTHER)
Admission: EM | Admit: 2022-01-18 | Discharge: 2022-01-18 | Disposition: A | Discharge status: Home / Self Care | Payer: BC Managed Care – PPO | Attending: Emergency Medicine | Admitting: Emergency Medicine

## 2022-01-18 ENCOUNTER — Telehealth: Payer: Self-pay | Admitting: General Practice

## 2022-01-18 ENCOUNTER — Encounter (HOSPITAL_BASED_OUTPATIENT_CLINIC_OR_DEPARTMENT_OTHER): Payer: Self-pay | Admitting: Emergency Medicine

## 2022-01-18 ENCOUNTER — Other Ambulatory Visit: Payer: Self-pay

## 2022-01-18 DIAGNOSIS — R0789 Other chest pain: Secondary | ICD-10-CM | POA: Diagnosis not present

## 2022-01-18 DIAGNOSIS — Z7982 Long term (current) use of aspirin: Secondary | ICD-10-CM | POA: Insufficient documentation

## 2022-01-18 DIAGNOSIS — Z7984 Long term (current) use of oral hypoglycemic drugs: Secondary | ICD-10-CM | POA: Insufficient documentation

## 2022-01-18 DIAGNOSIS — Z8616 Personal history of COVID-19: Secondary | ICD-10-CM | POA: Insufficient documentation

## 2022-01-18 DIAGNOSIS — R519 Headache, unspecified: Secondary | ICD-10-CM

## 2022-01-18 DIAGNOSIS — K029 Dental caries, unspecified: Secondary | ICD-10-CM | POA: Diagnosis not present

## 2022-01-18 DIAGNOSIS — Z79899 Other long term (current) drug therapy: Secondary | ICD-10-CM | POA: Insufficient documentation

## 2022-01-18 DIAGNOSIS — E11319 Type 2 diabetes mellitus with unspecified diabetic retinopathy without macular edema: Secondary | ICD-10-CM | POA: Diagnosis not present

## 2022-01-18 DIAGNOSIS — R072 Precordial pain: Secondary | ICD-10-CM | POA: Diagnosis not present

## 2022-01-18 DIAGNOSIS — I2510 Atherosclerotic heart disease of native coronary artery without angina pectoris: Secondary | ICD-10-CM | POA: Insufficient documentation

## 2022-01-18 DIAGNOSIS — J329 Chronic sinusitis, unspecified: Secondary | ICD-10-CM | POA: Diagnosis not present

## 2022-01-18 DIAGNOSIS — R079 Chest pain, unspecified: Secondary | ICD-10-CM | POA: Diagnosis not present

## 2022-01-18 DIAGNOSIS — Z0389 Encounter for observation for other suspected diseases and conditions ruled out: Secondary | ICD-10-CM | POA: Diagnosis not present

## 2022-01-18 DIAGNOSIS — I10 Essential (primary) hypertension: Secondary | ICD-10-CM | POA: Diagnosis not present

## 2022-01-18 LAB — CBC
HCT: 46.7 % — ABNORMAL HIGH (ref 36.0–46.0)
Hemoglobin: 15.8 g/dL — ABNORMAL HIGH (ref 12.0–15.0)
MCH: 29.4 pg (ref 26.0–34.0)
MCHC: 33.8 g/dL (ref 30.0–36.0)
MCV: 86.8 fL (ref 80.0–100.0)
Platelets: 214 10*3/uL (ref 150–400)
RBC: 5.38 MIL/uL — ABNORMAL HIGH (ref 3.87–5.11)
RDW: 13 % (ref 11.5–15.5)
WBC: 11.1 10*3/uL — ABNORMAL HIGH (ref 4.0–10.5)
nRBC: 0 % (ref 0.0–0.2)

## 2022-01-18 LAB — BASIC METABOLIC PANEL
Anion gap: 12 (ref 5–15)
BUN: 14 mg/dL (ref 6–20)
CO2: 22 mmol/L (ref 22–32)
Calcium: 9.2 mg/dL (ref 8.9–10.3)
Chloride: 102 mmol/L (ref 98–111)
Creatinine, Ser: 1.23 mg/dL — ABNORMAL HIGH (ref 0.44–1.00)
GFR, Estimated: 53 mL/min — ABNORMAL LOW (ref >60)
Glucose, Bld: 284 mg/dL — ABNORMAL HIGH (ref 70–99)
Potassium: 4.2 mmol/L (ref 3.5–5.1)
Sodium: 136 mmol/L (ref 135–145)

## 2022-01-18 LAB — TROPONIN I (HIGH SENSITIVITY)
Troponin I (High Sensitivity): 3 ng/L (ref <18)
Troponin I (High Sensitivity): 3 ng/L (ref <18)

## 2022-01-18 MED ORDER — KETOROLAC TROMETHAMINE 30 MG/ML IJ SOLN
30.0000 mg | Freq: Once | INTRAMUSCULAR | Status: AC
  Administered 2022-01-18: 30 mg via INTRAVENOUS
  Filled 2022-01-18: qty 1

## 2022-01-18 MED ORDER — SODIUM CHLORIDE 0.9 % IV BOLUS
500.0000 mL | Freq: Once | INTRAVENOUS | Status: AC
Start: 2022-01-18 — End: 2022-01-18
  Administered 2022-01-18: 500 mL via INTRAVENOUS

## 2022-01-18 MED ORDER — IOHEXOL 350 MG/ML SOLN
75.0000 mL | Freq: Once | INTRAVENOUS | Status: AC | PRN
  Administered 2022-01-18: 75 mL via INTRAVENOUS

## 2022-01-18 MED ORDER — AMOXICILLIN 500 MG PO CAPS
500.0000 mg | ORAL_CAPSULE | Freq: Once | ORAL | Status: AC
  Administered 2022-01-18: 500 mg via ORAL
  Filled 2022-01-18: qty 1

## 2022-01-18 MED ORDER — METOCLOPRAMIDE HCL 5 MG/ML IJ SOLN
10.0000 mg | Freq: Once | INTRAMUSCULAR | Status: AC
  Administered 2022-01-18: 10 mg via INTRAVENOUS
  Filled 2022-01-18: qty 2

## 2022-01-18 MED ORDER — FLUTICASONE PROPIONATE 50 MCG/ACT NA SUSP: 2.0000 | Freq: Every day | NASAL | 0 refills | Status: DC

## 2022-01-18 MED ORDER — DIPHENHYDRAMINE HCL 50 MG/ML IJ SOLN
12.5000 mg | Freq: Once | INTRAMUSCULAR | Status: AC
  Administered 2022-01-18: 12.5 mg via INTRAVENOUS
  Filled 2022-01-18: qty 1

## 2022-01-18 MED ORDER — MAGNESIUM SULFATE 2 GM/50ML IV SOLN
2.0000 g | Freq: Once | INTRAVENOUS | Status: AC
  Administered 2022-01-18: 2 g via INTRAVENOUS
  Filled 2022-01-18: qty 50

## 2022-01-18 MED ORDER — AMOXICILLIN 500 MG PO CAPS: 500.0000 mg | ORAL_CAPSULE | Freq: Three times a day (TID) | ORAL | 0 refills | Status: DC

## 2022-01-18 NOTE — ED Provider Notes (Signed)
?[facility] EMERGENCY DEPARTMENT ?Provider Note ? ? ?CSN: [id number] ?Arrival date & time: 01/18/22  0004 ? ?  ? ?History ? ?Chief Complaint  ?Patient presents with  ? Headache  ? Chest Pain  ? ? ?Sarah Kane is a 51 y.o. female. ? ?The history is provided by the patient.  ?Headache ?Pain location:  L temporal and L parietal ?Quality:  Dull ?Radiates to:  Face ?Severity currently:  10/10 ?Severity at highest:  10/10 ?Onset quality:  Gradual ?Duration:  4 days ?Timing:  Constant ?Progression:  Waxing and waning ?Chronicity:  Recurrent ?Context: not loud noise and not straining   ?Relieved by:  Nothing ?Worsened by:  Nothing ?Ineffective treatments:  None tried ?Associated symptoms: congestion   ?Associated symptoms: no abdominal pain, no back pain, no cough, no diarrhea, no dizziness, no eye pain, no fever, no focal weakness, no hearing loss, no loss of balance, no myalgias, no nausea, no neck pain, no numbness, no seizures, no sinus pressure, no swollen glands, no URI, no visual change, no vomiting and no weakness   ?Associated symptoms comment:  Dental pain ?Risk factors: no anger   ?Chest Pain ?Pain location:  Substernal area ?Pain quality: dull   ?Pain radiates to:  Does not radiate ?Pain severity:  Mild ?Onset quality:  Gradual ?Duration:  1 day ?Timing:  Constant ?Progression:  Unchanged ?Chronicity:  New ?Context: at rest   ?Relieved by:  Nothing ?Worsened by:  Nothing ?Ineffective treatments:  None tried ?Associated symptoms: headache   ?Associated symptoms: no abdominal pain, no anorexia, no back pain, no cough, no diaphoresis, no dizziness, no fever, no lower extremity edema, no nausea, no numbness, no orthopnea, no palpitations, no PND, no shortness of breath, no vomiting and no weakness   ?Risk factors: no prior DVT/PE   ?Patient with CAD and DM presents with headache with nasal congestion and dental pain x 4 days.  No f/c/r.  No weakness, no numbness nor changes in vision or speech.  Then one  day of chest pain without radiation or associated symptoms at rest which patient states is secondary to the stress of the headache.  No exertional symptoms.  No n/v/d.   ?  ?Past Medical History:  ?Diagnosis Date  ? Arthritis   ? Coronary artery disease   ? COVID-19 05/2020  ? Diabetes mellitus without complication (HCC)   ? Diabetic retinopathy (HCC)   ? Hyperlipidemia   ? Hypertension   ? Hypertensive retinopathy   ? Myocardial infarct, old   ? ? ?Home Medications ?Prior to Admission medications   ?Medication Sig Start Date End Date Taking? Authorizing Provider  ?amoxicillin (AMOXIL) 500 MG capsule Take 1 capsule (500 mg total) by mouth 3 (three) times daily. 01/18/22  Yes Leightyn Cina, MD  ?fluticasone (FLONASE) 50 MCG/ACT nasal spray Place 2 sprays into both nostrils daily. 01/18/22  Yes Lam Bjorklund, MD  ?acetaminophen (TYLENOL) 500 MG tablet Take 1,000 mg by mouth every 6 (six) hours as needed for mild pain or headache.     [provider]  ?aspirin 81 MG EC tablet Take 1 tablet (81 mg total) by mouth daily. Swallow whole. 06/04/20   Pricilla Riffle, MD  ?atorvastatin (LIPITOR) 80 MG tablet Take 1 tablet (80 mg total) by mouth daily. 07/26/21   Pricilla Riffle, MD  ?escitalopram (LEXAPRO) 20 MG tablet Take 1 tablet (20 mg total) by mouth daily. 01/02/22   Christen Butter, NP  ?gabapentin (NEURONTIN) 300 MG capsule Take 1  capsule (300 mg total) by mouth 3 (three) times daily. 01/02/22   Christen Butter, NP  ?glimepiride (AMARYL) 4 MG tablet TAKE 2 TABLETS BY MOUTH ONCE DAILY WITH BREAKFAST. 01/02/22   Christen Butter, NP  ?isosorbide mononitrate (IMDUR) 30 MG 24 hr tablet TAKE 1 & 1/2 (ONE & ONE-HALF) TABLETS BY MOUTH ONCE DAILY 07/26/21   Pricilla Riffle, MD  ?metoprolol succinate (TOPROL-XL) 25 MG 24 hr tablet Take 1 tablet (25 mg total) by mouth 2 (two) times daily with a meal. 07/26/21   Pricilla Riffle, MD  ?Multiple Vitamins-Minerals (MULTIVITAMIN WOMEN PO) Take 1 tablet by mouth 3 (three) times a week.     [provider]  ?prazosin (MINIPRESS) 2 MG capsule Take 1 capsule (2 mg total) by mouth at bedtime. 01/02/22   Christen Butter, NP  ?Semaglutide (RYBELSUS) 7 MG TABS Take 7 mg by mouth daily. 01/02/22   Christen Butter, NP  ?sitaGLIPtin (JANUVIA) 100 MG tablet Take 1 tablet (100 mg total) by mouth daily. 01/02/22   Christen Butter, NP  ?ticagrelor (BRILINTA) 90 MG TABS tablet Take 1 tablet (90 mg total) by mouth 2 (two) times daily. 07/26/21   Pricilla Riffle, MD  ?traMADol-acetaminophen (ULTRACET) 37.5-325 MG tablet Take 1-2 tablets by mouth every 6 (six) hours as needed. 06/21/21   Eustace Moore, MD  ?Vitamin D, Ergocalciferol, (DRISDOL) 1.25 MG (50000 UNIT) CAPS capsule Take 1 capsule by mouth once a week 07/09/20   Sunnie Nielsen, DO  ?   ? ?Allergies    ?Nitroglycerin   ? ?Review of Systems   ?Review of Systems  ?Constitutional:  Negative for diaphoresis and fever.  ?HENT:  Positive for congestion and dental problem. Negative for drooling, facial swelling, hearing loss and sinus pressure.   ?Eyes:  Negative for pain and visual disturbance.  ?Respiratory:  Negative for cough and shortness of breath.   ?Cardiovascular:  Positive for chest pain. Negative for palpitations, orthopnea and PND.  ?Gastrointestinal:  Negative for abdominal pain, anorexia, diarrhea, nausea and vomiting.  ?Musculoskeletal:  Negative for back pain, myalgias and neck pain.  ?Neurological:  Positive for headaches. Negative for dizziness, focal weakness, seizures, facial asymmetry, speech difficulty, weakness, light-headedness, numbness and loss of balance.  ?All other systems reviewed and are negative. ? ?Physical Exam ?Updated Vital Signs ?BP 125/83   Pulse 90   Temp 98.2 ?F (36.8 ?C) (Oral)   Resp 20   Wt 81.6 kg   SpO2 100%   BMI 35.15 kg/m?  ?Physical Exam ?Vitals and nursing note reviewed.  ?Constitutional:   ?   General: She is not in acute distress. ?   Appearance: Normal appearance. She is not diaphoretic.  ?HENT:  ?   Head:  Normocephalic and atraumatic.  ?   Nose: No rhinorrhea.  ?   Mouth/Throat:  ?   Mouth: Mucous membranes are moist.  ?   Pharynx: Oropharynx is clear.  ?   Comments: Widespread dental decay ?Eyes:  ?   Extraocular Movements: Extraocular movements intact.  ?   Conjunctiva/sclera: Conjunctivae normal.  ?   Pupils: Pupils are equal, round, and reactive to light.  ?Cardiovascular:  ?   Rate and Rhythm: Normal rate and regular rhythm.  ?   Pulses: Normal pulses.  ?   Heart sounds: Normal heart sounds.  ?Pulmonary:  ?   Effort: Pulmonary effort is normal.  ?   Breath sounds: Normal breath sounds.  ?Abdominal:  ?   General: Bowel sounds are  normal.  ?   Palpations: Abdomen is soft.  ?   Tenderness: There is no abdominal tenderness. There is no guarding.  ?Musculoskeletal:     ?   General: No tenderness. Normal range of motion.  ?   Cervical back: Normal range of motion and neck supple. No rigidity.  ?   Right lower leg: No edema.  ?   Left lower leg: No edema.  ?Skin: ?   General: Skin is warm and dry.  ?   Capillary Refill: Capillary refill takes less than 2 seconds.  ?Neurological:  ?   General: No focal deficit present.  ?   Mental Status: She is alert and oriented to person, place, and time.  ?   Cranial Nerves: No cranial nerve deficit.  ?   Sensory: No sensory deficit.  ?   Motor: No weakness.  ?   Deep Tendon Reflexes: Reflexes normal.  ?Psychiatric:     ?   Mood and Affect: Mood normal.     ?   Behavior: Behavior normal.  ? ? ?ED Results / Procedures / Treatments   ?Labs ?(all labs ordered are listed, but only abnormal results are displayed) ?Results for orders placed or performed during the hospital encounter of 01/18/22  ?Basic metabolic panel  ?Result Value Ref Range  ? Sodium 136 135 - 145 mmol/L  ? Potassium 4.2 3.5 - 5.1 mmol/L  ? Chloride 102 98 - 111 mmol/L  ? CO2 22 22 - 32 mmol/L  ? Glucose, Bld 284 (H) 70 - 99 mg/dL  ? BUN 14 6 - 20 mg/dL  ? Creatinine, Ser 1.23 (H) 0.44 - 1.00 mg/dL  ? Calcium 9.2 8.9 -  10.3 mg/dL  ? GFR, Estimated 53 (L) >60 mL/min  ? Anion gap 12 5 - 15  ?CBC  ?Result Value Ref Range  ? WBC 11.1 (H) 4.0 - 10.5 K/uL  ? RBC 5.38 (H) 3.87 - 5.11 MIL/uL  ? Hemoglobin 15.8 (H) 12.0 - 15.0 g/dL  ? HCT 46.7 (H

## 2022-01-18 NOTE — Telephone Encounter (Signed)
Transition Care Management Unsuccessful Follow-up Telephone Call ? ?Date of discharge and from where:  01/18/22 from [facility] ? ?Attempts:  1st Attempt ? ?Reason for unsuccessful TCM follow-up call:  Left voice message ? ?  ?

## 2022-01-18 NOTE — ED Notes (Signed)
Patient transported to X-ray 

## 2022-01-18 NOTE — Progress Notes (Shared)
?[facility] - Clinic Note ? ?01/31/2022 ? ?  ? ?CHIEF COMPLAINT ?Patient presents for No chief complaint on file. ? ? ? ?HISTORY OF PRESENT ILLNESS: ?Sarah Kane is a 51 y.o. female who presents to the clinic today for:  ? ? ?Pt states no change in vision ? ?Referring physician: ?Aura CampsSpencer, Michael MD ?[address] ?[place], [place] [id number] ? ?HISTORICAL INFORMATION:  ?Selected notes from the MEDICAL RECORD NUMBER ?Referred by Dr. Karleen HampshireSpencer for diabetic eye exam ?LEE: [phone], BCVA OD: 20/50 OS: 20/70 ?Ocular Hx- DM, cataracts ?PMH- DM, cholesterol ?  ? ?CURRENT MEDICATIONS: ?No current outpatient medications on file. (Ophthalmic Drugs)  ? ?No current facility-administered medications for this visit. (Ophthalmic Drugs)  ? ?Current Outpatient Medications (Other)  ?Medication Sig  ? acetaminophen (TYLENOL) 500 MG tablet Take 1,000 mg by mouth every 6 (six) hours as needed for mild pain or headache.   ? amoxicillin (AMOXIL) 500 MG capsule Take 1 capsule (500 mg total) by mouth 3 (three) times daily.  ? aspirin 81 MG EC tablet Take 1 tablet (81 mg total) by mouth daily. Swallow whole.  ? atorvastatin (LIPITOR) 80 MG tablet Take 1 tablet (80 mg total) by mouth daily.  ? escitalopram (LEXAPRO) 20 MG tablet Take 1 tablet (20 mg total) by mouth daily.  ? fluticasone (FLONASE) 50 MCG/ACT nasal spray Place 2 sprays into both nostrils daily.  ? gabapentin (NEURONTIN) 300 MG capsule Take 1 capsule (300 mg total) by mouth 3 (three) times daily.  ? glimepiride (AMARYL) 4 MG tablet TAKE 2 TABLETS BY MOUTH ONCE DAILY WITH BREAKFAST.  ? isosorbide mononitrate (IMDUR) 30 MG 24 hr tablet TAKE 1 & 1/2 (ONE & ONE-HALF) TABLETS BY MOUTH ONCE DAILY  ? metoprolol succinate (TOPROL-XL) 25 MG 24 hr tablet Take 1 tablet (25 mg total) by mouth 2 (two) times daily with a meal.  ? Multiple Vitamins-Minerals (MULTIVITAMIN WOMEN PO) Take 1 tablet by mouth 3 (three) times a week.   ? prazosin (MINIPRESS) 2 MG capsule Take 1  capsule (2 mg total) by mouth at bedtime.  ? Semaglutide (RYBELSUS) 7 MG TABS Take 7 mg by mouth daily.  ? sitaGLIPtin (JANUVIA) 100 MG tablet Take 1 tablet (100 mg total) by mouth daily.  ? ticagrelor (BRILINTA) 90 MG TABS tablet Take 1 tablet (90 mg total) by mouth 2 (two) times daily.  ? traMADol-acetaminophen (ULTRACET) 37.5-325 MG tablet Take 1-2 tablets by mouth every 6 (six) hours as needed.  ? Vitamin D, Ergocalciferol, (DRISDOL) 1.25 MG (50000 UNIT) CAPS capsule Take 1 capsule by mouth once a week  ? ?No current facility-administered medications for this visit. (Other)  ? ?REVIEW OF SYSTEMS: ? ? ?ALLERGIES ?Allergies  ?Allergen Reactions  ? Nitroglycerin   ?  Blood pressure dropped, was hospitalized   ? ?PAST MEDICAL HISTORY ?Past Medical History:  ?Diagnosis Date  ? Arthritis   ? Coronary artery disease   ? COVID-19 05/2020  ? Diabetes mellitus without complication (HCC)   ? Diabetic retinopathy (HCC)   ? Hyperlipidemia   ? Hypertension   ? Hypertensive retinopathy   ? Myocardial infarct, old   ? ?Past Surgical History:  ?Procedure Laterality Date  ? CARDIAC CATHETERIZATION    ? CESAREAN SECTION  1990  ? CORONARY STENT INTERVENTION N/A 05/20/2020  ? Procedure: CORONARY STENT INTERVENTION;  Surgeon: Kathleene HazelMcAlhany, Christopher D, MD;  Location: [facility] INVASIVE [facility];  Service: Cardiovascular;  Laterality: N/A;  ? CORONARY STENT INTERVENTION N/A 07/22/2020  ?  Procedure: CORONARY STENT INTERVENTION;  Surgeon: Kathleene Hazel, MD;  Location: [facility]AS[facility] CV LAB;  Service: Cardiovascular;  Laterality: N/A;  ? LEFT HEART CATH AND CORONARY ANGIOGRAPHY N/A 05/20/2020  ? Procedure: LEFT HEART CATH AND CORONARY ANGIOGRAPHY;  Surgeon: Kathleene Hazel, MD;  Location: [facility]AS[facility] CV [facility];  Service: Cardiovascular;  Laterality: N/A;  ? TRIGGER FINGER RELEASE Right   ? thumb  ? ?FAMILY HISTORY ?Family History  ?Problem Relation Age of Onset  ? High blood pressure Mother   ? Diabetes Mother   ? Diabetes Maternal  Grandmother   ? High blood pressure Maternal Grandmother   ? Heart attack Maternal Grandfather   ? Diabetes Maternal Grandfather   ? High blood pressure Maternal Grandfather   ? High blood pressure Paternal Grandmother   ? High blood pressure Paternal Grandfather   ? Diabetes Maternal Aunt   ? Diabetes Maternal Uncle   ? Skin cancer Maternal Uncle   ? ?SOCIAL HISTORY ?Social History  ? ?Tobacco Use  ? Smoking status: Never  ? Smokeless tobacco: Never  ?Vaping Use  ? Vaping Use: Never used  ?Substance Use Topics  ? Alcohol use: No  ? Drug use: No  ?  ? ?  ?OPHTHALMIC EXAM: ?Not recorded ?  ? ?IMAGING AND PROCEDURES  ?Imaging and Procedures for 01/31/2022 ? ? ?  ?  ? ?  ?ASSESSMENT/PLAN: ? ?No diagnosis found. ? ?1. Proliferative diabetic retinopathy w/ DME, OS ? - f/u delayed to 5+ wks from 4 due to family obligations ? - FA (02.02.22) shows focal NV nasal periphery OS ?- s/p IVA OS #1 (02.02.22), #2 (03.04.22), #3 (04.01.22), #4 (05.12.22), #5 (06.09.22), #6 (07.21.22), #7 (08.18.22), #8 (09.15.22), #9 (10.13.22), #10 (11.10.22), #11 (12.08.22), #12 (01.05.23) ?- s/p IVE OS #1 (02.02.23, sample), #2 (03.02.23), #3 (4.11.23) ? - s/p PRP OS (02.18.22) ?- BCVA 20/30+ OS from 20/25 ? - OCT shows OS: Interval improvement in cystic changes temporal macula and fovea at 5+ wks ?- recommend IVE OS #4 today, 05.09.23 for DME with f/u in 4 weeks ?- pt wishes to proceed ?- RBA of procedure discussed, questions answered ?- IVA informed consent obtained and signed, 02.02.22 (OS) ?- IVE informed consent obtained and signed, 02.02.23 (OS) ?- see procedure note ?- f/u 4 weeks DFE, OCT, possible injection  ? ?2. Severe Non-proliferative diabetic retinopathy, OD ?- FA (02.02.22) shows no NV OD ?- s/p Focal laser OD (05.27.22) for DME ?- BCVA 20/20 OD ?- OCT shows OD: Trace cystic changes greatest temporal macula - persistent ?- exam shows scattered IRH ?- no treatment at this time ?- f/u 4 weeks DFE, OCT  ? ?3,4. Hypertensive  retinopathy OU ?- discussed importance of tight BP control ?- history of MI 04/2020 s/p stents ?- monitor ? ?Ophthalmic Meds Ordered this visit:  ?No orders of the defined types were placed in this encounter. ?  ? ?No follow-ups on file. ? ?There are no Patient Instructions on file for this visit. ? ?Explained the diagnoses, plan, and follow up with the patient and they expressed understanding.  Patient expressed understanding of the importance of proper follow up care.  ?This document serves as a record of services personally performed by Karie Chimera, MD, PhD. It was created on their behalf by Julieanne Cotton, COT, an ophthalmic technician. The creation of this record is the provider's dictation and/or activities during the visit.   ? ?Electronically signed by: Julieanne Cotton, COT 01/18/22 9:42AM ? ?Isaias Cowman  Vanessa Barbara, M.D., Ph.D. ?Diseases & Surgery of the Retina and Vitreous ?[facility] ? ? ? ?Abbreviations: ?M myopia (nearsighted); A astigmatism; H hyperopia (farsighted); P presbyopia; Mrx spectacle prescription;  CTL contact lenses; OD right eye; OS left eye; OU both eyes  XT exotropia; ET esotropia; PEK punctate epithelial keratitis; PEE punctate epithelial erosions; DES dry eye syndrome; MGD meibomian gland dysfunction; ATs artificial tears; PFAT's preservative free artificial tears; NSC nuclear sclerotic cataract; PSC posterior subcapsular cataract; ERM epi-retinal membrane; PVD posterior vitreous detachment; RD retinal detachment; DM diabetes mellitus; DR diabetic retinopathy; NPDR non-proliferative diabetic retinopathy; PDR proliferative diabetic retinopathy; CSME clinically significant macular edema; DME diabetic macular edema; dbh dot blot hemorrhages; CWS cotton wool spot; POAG primary open angle glaucoma; C/D cup-to-disc ratio; HVF humphrey visual field; GVF goldmann visual field; OCT optical coherence tomography; IOP intraocular pressure; BRVO Branch retinal vein  occlusion; CRVO central retinal vein occlusion; CRAO central retinal artery occlusion; BRAO branch retinal artery occlusion; RT retinal tear; SB scleral buckle; PPV pars plana vitrectomy; VH Vitreous hemorrhage; PRP panretin

## 2022-01-18 NOTE — ED Triage Notes (Signed)
Left sided headache x 3 days. Central cp started today. Hx of MI and stents. Tylenol with no relief.  ?

## 2022-01-24 NOTE — Telephone Encounter (Signed)
Transition Care Management Unsuccessful Follow-up Telephone Call ? ?Date of discharge and from where:  01/18/22 from [facility] ? ?Attempts:  2nd Attempt ? ?Reason for unsuccessful TCM follow-up call:  Left voice message ? ?  ?

## 2022-01-25 NOTE — Telephone Encounter (Signed)
Transition Care Management Unsuccessful Follow-up Telephone Call ? ?Date of discharge and from where:  01/18/22 from [facility] ? ?Attempts:  3rd Attempt ? ?Reason for unsuccessful TCM follow-up call:  No answer/busy ? ?  ?

## 2022-01-31 ENCOUNTER — Encounter (INDEPENDENT_AMBULATORY_CARE_PROVIDER_SITE_OTHER): Payer: BC Managed Care – PPO | Admitting: Ophthalmology

## 2022-01-31 DIAGNOSIS — E113512 Type 2 diabetes mellitus with proliferative diabetic retinopathy with macular edema, left eye: Secondary | ICD-10-CM

## 2022-01-31 DIAGNOSIS — E113311 Type 2 diabetes mellitus with moderate nonproliferative diabetic retinopathy with macular edema, right eye: Secondary | ICD-10-CM

## 2022-01-31 DIAGNOSIS — H35033 Hypertensive retinopathy, bilateral: Secondary | ICD-10-CM

## 2022-01-31 DIAGNOSIS — I10 Essential (primary) hypertension: Secondary | ICD-10-CM

## 2022-02-02 NOTE — Progress Notes (Shared)
?[facility] - Clinic Note ? ?02/08/2022 ? ?  ? ?CHIEF COMPLAINT ?Patient presents for No chief complaint on file. ? ? ? ?HISTORY OF PRESENT ILLNESS: ?Sarah Kane is a 51 y.o. female who presents to the clinic today for:  ? ? ? ?Referring physician: ?Aura Camps MD ?[address] ?[place], [place] [address] ? ?HISTORICAL INFORMATION:  ?Selected notes from the MEDICAL RECORD NUMBER ?Referred by Dr. Karleen Hampshire for diabetic eye exam ?LEE: [phone], BCVA OD: 20/50 OS: 20/70 ?Ocular Hx- DM, cataracts ?PMH- DM, cholesterol ?  ? ?CURRENT MEDICATIONS: ?No current outpatient medications on file. (Ophthalmic Drugs)  ? ?No current facility-administered medications for this visit. (Ophthalmic Drugs)  ? ?Current Outpatient Medications (Other)  ?Medication Sig  ? acetaminophen (TYLENOL) 500 MG tablet Take 1,000 mg by mouth every 6 (six) hours as needed for mild pain or headache.   ? amoxicillin (AMOXIL) 500 MG capsule Take 1 capsule (500 mg total) by mouth 3 (three) times daily.  ? aspirin 81 MG EC tablet Take 1 tablet (81 mg total) by mouth daily. Swallow whole.  ? atorvastatin (LIPITOR) 80 MG tablet Take 1 tablet (80 mg total) by mouth daily.  ? escitalopram (LEXAPRO) 20 MG tablet Take 1 tablet (20 mg total) by mouth daily.  ? fluticasone (FLONASE) 50 MCG/ACT nasal spray Place 2 sprays into both nostrils daily.  ? gabapentin (NEURONTIN) 300 MG capsule Take 1 capsule (300 mg total) by mouth 3 (three) times daily.  ? glimepiride (AMARYL) 4 MG tablet TAKE 2 TABLETS BY MOUTH ONCE DAILY WITH BREAKFAST.  ? isosorbide mononitrate (IMDUR) 30 MG 24 hr tablet TAKE 1 & 1/2 (ONE & ONE-HALF) TABLETS BY MOUTH ONCE DAILY  ? metoprolol succinate (TOPROL-XL) 25 MG 24 hr tablet Take 1 tablet (25 mg total) by mouth 2 (two) times daily with a meal.  ? Multiple Vitamins-Minerals (MULTIVITAMIN WOMEN PO) Take 1 tablet by mouth 3 (three) times a week.   ? prazosin (MINIPRESS) 2 MG capsule Take 1 capsule (2 mg total) by mouth at  bedtime.  ? Semaglutide (RYBELSUS) 7 MG TABS Take 7 mg by mouth daily.  ? sitaGLIPtin (JANUVIA) 100 MG tablet Take 1 tablet (100 mg total) by mouth daily.  ? ticagrelor (BRILINTA) 90 MG TABS tablet Take 1 tablet (90 mg total) by mouth 2 (two) times daily.  ? traMADol-acetaminophen (ULTRACET) 37.5-325 MG tablet Take 1-2 tablets by mouth every 6 (six) hours as needed.  ? Vitamin D, Ergocalciferol, (DRISDOL) 1.25 MG (50000 UNIT) CAPS capsule Take 1 capsule by mouth once a week  ? ?No current facility-administered medications for this visit. (Other)  ? ?REVIEW OF SYSTEMS: ? ? ?ALLERGIES ?Allergies  ?Allergen Reactions  ? Nitroglycerin   ?  Blood pressure dropped, was hospitalized   ? ?PAST MEDICAL HISTORY ?Past Medical History:  ?Diagnosis Date  ? Arthritis   ? Coronary artery disease   ? COVID-19 05/2020  ? Diabetes mellitus without complication (HCC)   ? Diabetic retinopathy (HCC)   ? Hyperlipidemia   ? Hypertension   ? Hypertensive retinopathy   ? Myocardial infarct, old   ? ?Past Surgical History:  ?Procedure Laterality Date  ? CARDIAC CATHETERIZATION    ? CESAREAN SECTION  1990  ? CORONARY STENT INTERVENTION N/A 05/20/2020  ? Procedure: CORONARY STENT INTERVENTION;  Surgeon: Kathleene Hazel, MD;  [facility] INVAS[facility];  Service: Cardiovascular;  Laterality: N/A;  ? CORONARY STENT INTERVENTION N/A 07/22/2020  ? Procedure: CORONARY STENT INTERVENTION;  Surgeon: Kathleene Hazel, MD;  [facility]: [facility] INVASIVE CV LAB;  Service: Cardiovascular;  Laterality: N/A;  ? LEFT HEART CATH AND CORONARY ANGIOGRAPHY N/A 05/20/2020  ? Procedure: LEFT HEART CATH AND CORONARY ANGIOGRAPHY;  Surgeon: Kathleene Hazel, MD;  Location: [facility] INVASIVE CV LAB;  Service: Cardiovascular;  Laterality: N/A;  ? TRIGGER FINGER RELEASE Right   ? thumb  ? ?FAMILY HISTORY ?Family History  ?Problem Relation Age of Onset  ? High blood pressure Mother   ? Diabetes Mother   ? Diabetes Maternal Grandmother   ? High blood pressure  Maternal Grandmother   ? Heart attack Maternal Grandfather   ? Diabetes Maternal Grandfather   ? High blood pressure Maternal Grandfather   ? High blood pressure Paternal Grandmother   ? High blood pressure Paternal Grandfather   ? Diabetes Maternal Aunt   ? Diabetes Maternal Uncle   ? Skin cancer Maternal Uncle   ? ?SOCIAL HISTORY ?Social History  ? ?Tobacco Use  ? Smoking status: Never  ? Smokeless tobacco: Never  ?Vaping Use  ? Vaping Use: Never used  ?Substance Use Topics  ? Alcohol use: No  ? Drug use: No  ?  ? ?  ?OPHTHALMIC EXAM: ?Not recorded ?  ? ?IMAGING AND PROCEDURES  ?Imaging and Procedures for 02/08/2022 ? ? ?  ?  ? ?  ?ASSESSMENT/PLAN: ? ?No diagnosis found. ? ?1. Proliferative diabetic retinopathy w/ DME, OS ? - f/u delayed to 5+ wks from 4 due to family obligations ? - FA (02.02.22) shows focal NV nasal periphery OS ?- s/p IVA OS #1 (02.02.22), #2 (03.04.22), #3 (04.01.22), #4 (05.12.22), #5 (06.09.22), #6 (07.21.22), #7 (08.18.22), #8 (09.15.22), #9 (10.13.22), #10 (11.10.22), #11 (12.08.22), #12 (01.05.23) ?- s/p IVE OS #1 (02.02.23, sample), #2 (03.02.23), #3 (04.11.23) ? - s/p PRP OS (02.18.22) ?- BCVA 20/30+ OS from 20/25 ? - OCT shows OS: Interval improvement in cystic changes temporal macula and fovea at 5+ wks ?- recommend IVE OS #4 today, 05.17.23 for DME with f/u in 4 weeks ?- pt wishes to proceed ?- RBA of procedure discussed, questions answered ?- IVA informed consent obtained and signed, 02.02.22 (OS) ?- IVE informed consent obtained and signed, 02.02.23 (OS) ?- see procedure note ?- f/u 4 weeks DFE, OCT, possible injection  ? ?2. Severe Non-proliferative diabetic retinopathy, OD ?- FA (02.02.22) shows no NV OD ?- s/p Focal laser OD (05.27.22) for DME ?- BCVA 20/20 OD ?- OCT shows OD: Trace cystic changes greatest temporal macula - persistent ?- exam shows scattered IRH ?- no treatment at this time ?- f/u 4 weeks, DFE, OCT ? ?3,4. Hypertensive retinopathy OU ?- discussed importance of  tight BP control ?- history of MI 04/2020 s/p stents ?- monitor  ? ?Ophthalmic Meds Ordered this visit:  ?No orders of the defined types were placed in this encounter. ?  ? ?No follow-ups on file. ? ?There are no Patient Instructions on file for this visit. ? ?Explained the diagnoses, plan, and follow up with the patient and they expressed understanding.  Patient expressed understanding of the importance of proper follow up care. ? ?This document serves as a record of services personally performed by Karie Chimera, MD, PhD. It was created on their behalf by De Blanch, an ophthalmic technician. The creation of this record is the provider's dictation and/or activities during the visit.   ? ?Electronically signed by: De Blanch, OA, 02/02/22  12:27 PM ?  ? ? ?Karie Chimera, M.D., Ph.D. ?  Diseases & Surgery of the Retina and Vitreous ?[facility] ? ?I have reviewed the above documentation for accuracy and completeness, and I agree with the above. Karie ChimeraBrian G. Zamora, M.D., Ph.D. 01/03/22 12:27 PM ? ?Abbreviations: ?M myopia (nearsighted); A astigmatism; H hyperopia (farsighted); P presbyopia; Mrx spectacle prescription;  CTL contact lenses; OD right eye; OS left eye; OU both eyes  XT exotropia; ET esotropia; PEK punctate epithelial keratitis; PEE punctate epithelial erosions; DES dry eye syndrome; MGD meibomian gland dysfunction; ATs artificial tears; PFAT's preservative free artificial tears; NSC nuclear sclerotic cataract; PSC posterior subcapsular cataract; ERM epi-retinal membrane; PVD posterior vitreous detachment; RD retinal detachment; DM diabetes mellitus; DR diabetic retinopathy; NPDR non-proliferative diabetic retinopathy; PDR proliferative diabetic retinopathy; CSME clinically significant macular edema; DME diabetic macular edema; dbh dot blot hemorrhages; CWS cotton wool spot; POAG primary open angle glaucoma; C/D cup-to-disc ratio; HVF humphrey visual field; GVF goldmann  visual field; OCT optical coherence tomography; IOP intraocular pressure; BRVO Branch retinal vein occlusion; CRVO central retinal vein occlusion; CRAO central retinal artery occlusion; BRAO branch retinal ar

## 2022-02-08 ENCOUNTER — Encounter (INDEPENDENT_AMBULATORY_CARE_PROVIDER_SITE_OTHER): Payer: Self-pay

## 2022-02-08 ENCOUNTER — Encounter (INDEPENDENT_AMBULATORY_CARE_PROVIDER_SITE_OTHER): Payer: BC Managed Care – PPO | Admitting: Ophthalmology

## 2022-02-08 DIAGNOSIS — E113512 Type 2 diabetes mellitus with proliferative diabetic retinopathy with macular edema, left eye: Secondary | ICD-10-CM

## 2022-02-08 DIAGNOSIS — E113311 Type 2 diabetes mellitus with moderate nonproliferative diabetic retinopathy with macular edema, right eye: Secondary | ICD-10-CM

## 2022-02-08 DIAGNOSIS — I10 Essential (primary) hypertension: Secondary | ICD-10-CM

## 2022-02-08 DIAGNOSIS — H35033 Hypertensive retinopathy, bilateral: Secondary | ICD-10-CM

## 2022-02-17 ENCOUNTER — Encounter: Payer: Self-pay | Admitting: Neurology

## 2022-02-17 NOTE — Telephone Encounter (Signed)
Mychart message sent to patient.

## 2022-03-13 NOTE — Progress Notes (Unsigned)
Cardiology Office Note   Date:  03/15/2022   ID:  [name], DOB [birth year], MRN [id number]  PCP:  Christen Butter, NP  Cardiologist: Dr. Dietrich Pates  Pt presents for f/u of CAD      History of Present Illness: Sarah Kane is a 51 y.o. female who has a hx of HTN, DM2 and CAD. She had an NSTEMI 04/2020 at which time she underwent LHC that showed diffuse CAD with 30% pLAD, 99% dLAD, 70% prox LCx, 100% mRCA, 70% dRCA. She underwent PCI/DES to RCA with resolution of symptoms. Per chart review, staged PCI would be considered in the LCx if refractory symptoms. Echo at that time showed an LVEF at 45-50%. Losartan was held due to soft BPs. She was seen in follow up 06/03/20 with continued mild chest discomfort. Imdur was added to her regimen.     After d/c she continued to have angina    She ultimately underwent LHC on 07/22/2020 which showed a proximal LCx lesion at 70% at which time DES/PCI was placed with recommendations for DAPT with ASA and Brilinta for 1 year.  I saw the pt in Nov 2022    The pt deneis CP   Breathing is OK    She is living with mother   Technically "homeless"   Bought a home but it has no Camera operator  Past Medical History:  Diagnosis Date   Arthritis    Coronary artery disease    COVID-19 05/2020   Diabetes mellitus without complication (HCC)    Diabetic retinopathy (HCC)    Hyperlipidemia    Hypertension    Hypertensive retinopathy    Myocardial infarct, old     Past Surgical History:  Procedure Laterality Date   CARDIAC CATHETERIZATION     CESAREAN SECTION  1990   CORONARY STENT INTERVENTION N/A 05/20/2020   Procedure: CORONARY STENT INTERVENTION;  Surgeon: Kathleene Hazel, MD;  Location: [facility];  Service: Cardiovascular;  Laterality: N/A;   CORONARY STENT INTERVENTION N/A 07/22/2020   Procedure: CORONARY STENT INTERVENTION;  Surgeon: Kathleene Hazel, MD;  Location: [facility]AS[facility];  Service: Cardiovascular;  Laterality:  N/A;   LEFT HEART CATH AND CORONARY ANGIOGRAPHY N/A 05/20/2020   Procedure: LEFT HEART CATH AND CORONARY ANGIOGRAPHY;  Surgeon: Kathleene Hazel, MD;  Location: [facility]AS[facility];  Service: Cardiovascular;  Laterality: N/A;   TRIGGER FINGER RELEASE Right    thumb     Current Outpatient Medications  Medication Sig Dispense Refill   acetaminophen (TYLENOL) 500 MG tablet Take 1,000 mg by mouth every 6 (six) hours as needed for mild pain or headache.      aspirin 81 MG EC tablet Take 1 tablet (81 mg total) by mouth daily. Swallow whole. 30 tablet 11   atorvastatin (LIPITOR) 80 MG tablet Take 1 tablet (80 mg total) by mouth daily. 90 tablet 3   gabapentin (NEURONTIN) 300 MG capsule Take 1 capsule (300 mg total) by mouth 3 (three) times daily. 270 capsule 1   glimepiride (AMARYL) 4 MG tablet TAKE 2 TABLETS BY MOUTH ONCE DAILY WITH BREAKFAST. 180 tablet 1   isosorbide mononitrate (IMDUR) 30 MG 24 hr tablet TAKE 1 & 1/2 (ONE & ONE-HALF) TABLETS BY MOUTH ONCE DAILY 135 tablet 3   metoprolol succinate (TOPROL-XL) 25 MG 24 hr tablet Take 1 tablet (25 mg total) by mouth 2 (two) times daily with a meal. 180 tablet  3   prazosin (MINIPRESS) 2 MG capsule Take 1 capsule (2 mg total) by mouth at bedtime. 90 capsule 1   sitaGLIPtin (JANUVIA) 100 MG tablet Take 1 tablet (100 mg total) by mouth daily. 90 tablet 1   ticagrelor (BRILINTA) 90 MG TABS tablet Take 1 tablet (90 mg total) by mouth 2 (two) times daily. 180 tablet 3   traMADol-acetaminophen (ULTRACET) 37.5-325 MG tablet Take 1-2 tablets by mouth every 6 (six) hours as needed. 20 tablet 0   Semaglutide (RYBELSUS) 7 MG TABS Take 7 mg by mouth daily. (Patient not taking: Reported on 03/15/2022) 30 tablet 0   No current facility-administered medications for this visit.    Allergies:   Nitroglycerin    Social History:  The patient  reports that she has never smoked. She has never used smokeless tobacco. She reports that she does not drink alcohol  and does not use drugs.   Family History:  The patient's family history includes Diabetes in her maternal aunt, maternal grandfather, maternal grandmother, maternal uncle, and mother; Heart attack in her maternal grandfather; High blood pressure in her maternal grandfather, maternal grandmother, mother, paternal grandfather, and paternal grandmother; Skin cancer in her maternal uncle.    ROS:  Please see the history of present illness. Otherwise, review of systems are positive for none.   All other systems are reviewed and negative.    PHYSICAL EXAM: VS:  BP 120/82   Pulse 78   Ht 5' (1.524 m)   Wt 196 lb (88.9 kg)   SpO2 98%   BMI 38.28 kg/m  , BMI Body mass index is 38.28 kg/m.   General: Morbidly obese 51 yo in NAD  Neck: No JVD Lungs:Clear to ausculation bilaterally. Cardiovascular: RRR with S1 S2. No murmurs  No LE edema    Extremities: No edema. Radial pulses 2+ bilaterally Neuro: Alert and oriented. No focal deficits.  Psych: Responds to questions appropriately with normal affect.     EKG:  EKG not ordered today.   Recent Labs: 01/02/2022: ALT 27 01/18/2022: BUN 14; Creatinine, Ser 1.23; Hemoglobin 15.8; Platelets 214; Potassium 4.2; Sodium 136    Lipid Panel    Component Value Date/Time   CHOL 154 01/02/2022 0000   CHOL 135 06/18/2020 1226   TRIG 236 (H) 01/02/2022 0000   HDL 56 01/02/2022 0000   HDL 49 06/18/2020 1226   CHOLHDL 2.8 01/02/2022 0000   VLDL 30 05/21/2020 0438   LDLCALC 69 01/02/2022 0000   Wt Readings from Last 3 Encounters:  03/15/22 196 lb (88.9 kg)  01/18/22 180 lb (81.6 kg)  01/02/22 198 lb 8 oz (90 kg)    Other studies Reviewed: Additional studies/ records that were reviewed today include:  Review of the above records demonstrates:   Echo 07/01/21    Cardiac Catheterization 07/22/2020:   Prox Cx lesion is 70% stenosed. A drug-eluting stent was successfully placed using a SYNERGY XD 3.0X16. Post intervention, there is a 0%  residual stenosis.   1. Severe stenosis proximal Circumflex artery 2. Successful PTCA/DES x 1 proximal Circumflex   Recommendations: Continue DAPT with ASA and Brilinta for one year.    Diagnostic Dominance: Right  Intervention       ASSESSMENT AND PLAN:  1. Chest pain with know CAD s/p recent PCI and known residual LCx disease: -Underwent LHC with PCI to RCA 04/2020>>placed on ASA and Brilinta -She was eventually referred for repeat LHC on 07/22/2020 which showed a proximal LCx lesion at 70% at  which time DES/PCI was placed with recommendations for DAPT with ASA and Brilinta for 1 year She continues to do well  NO CP   WIl review Brilinta   Stop  ? Place on Plavix or stay on ASA   2. HLD: -Lipids in April 2023  LDL 69   HDL 56  Trig 236    Disucssed diet    Needs less carbs     3. DM2: -HgbA12C 9.7   She admits to having food issues     4. HTN:  -BP is controlled.  Continue current meds        5. Cardiomyopathy  Echo on 10/7 LVEF normal     6  Lifestyle medicine    Will try to get on program    Also check with SW resources for patient  Signed, Dietrich Pates, MD  03/15/2022 10:23 AM    [facility] [address] [place], [place], [place]  [address] Phone: [phone]; Fax: [phone]

## 2022-03-15 ENCOUNTER — Ambulatory Visit (INDEPENDENT_AMBULATORY_CARE_PROVIDER_SITE_OTHER): Payer: BC Managed Care – PPO | Admitting: Internal Medicine

## 2022-03-15 ENCOUNTER — Encounter: Payer: Self-pay | Admitting: Internal Medicine

## 2022-03-15 VITALS — BP 120/82 | HR 78 | Ht 60.0 in | Wt 196.0 lb

## 2022-03-15 DIAGNOSIS — I2510 Atherosclerotic heart disease of native coronary artery without angina pectoris: Secondary | ICD-10-CM

## 2022-03-15 NOTE — Patient Instructions (Addendum)
  Your physician recommends that you continue on your current medications as directed. Please refer to the Current Medication list given to you today.  *If you need a refill on your cardiac medications before your next appointment, please call your pharmacy*   Lab Work: NONE ORDERED  TODAY   If you have labs (blood work) drawn today and your tests are completely normal, you will receive your results only by: MyChart Message (if you have MyChart) OR A paper copy in the mail If you have any lab test that is abnormal or we need to change your treatment, we will call you to review the results.   Testing/Procedures: NONE ORDERED  TODAY     Follow-Up: At [facility], you and your health needs are our priority.  As part of our continuing mission to provide you with exceptional heart care, we have created designated Provider Care Teams.  These Care Teams include your primary Cardiologist (physician) and Advanced Practice Providers (APPs -  Physician Assistants and Nurse Practitioners) who all work together to provide you with the care you need, when you need it.  We recommend signing up for the patient portal called "MyChart".  Sign up information is provided on this After Visit Summary.  MyChart is used to connect with patients for Virtual Visits (Telemedicine).  Patients are able to view lab/test results, encounter notes, upcoming appointments, etc.  Non-urgent messages can be sent to your provider as well.   To learn more about what you can do with MyChart, go to [email or web address].    Your next appointment:   9  month(s)  The format for your next appointment:   In Person  Provider:   Dietrich Pates, MD     Other Instructions   Important Information About Sugar

## 2022-04-03 ENCOUNTER — Telehealth: Payer: Self-pay | Admitting: *Deleted

## 2022-04-03 NOTE — Telephone Encounter (Signed)
   Pre-operative Risk Assessment    Patient Name: Sarah Kane  DOB: Apr 02, 1971 MRN: [id number]     Request for Surgical Clearance    Procedure:   PT IS TO HAVE ROOT TIPS REMOVED FROM 1 BROKEN TOOTH ON LOWER LEFT JAW. TOOTH IS ALREADY BROKEN OFF  Date of Surgery:  Clearance TBD                                 Surgeon:  DR. Estevan Oaks, JR Surgeon's Group or Practice Name:  [facility] Phone number:  [phone] Fax number:  [phone]   Type of Clearance Requested:   - Medical  - Pharmacy:  Hold Ticagrelor (Brilinta)     Type of Anesthesia:  Local Joselyn Arrow OXIDE   Additional requests/questions:    Elpidio Anis   04/03/2022, 4:34 PM

## 2022-04-04 NOTE — Telephone Encounter (Signed)
   Primary Cardiologist: Dietrich Pates, MD  Chart reviewed as part of pre-operative protocol coverage. Simple dental extractions are considered low risk procedures per guidelines and generally do not require any specific cardiac clearance. It is also generally accepted that for simple extractions and dental cleanings, there is no need to interrupt blood thinner therapy.   SBE prophylaxis is not required for the patient.  I will route this recommendation to the requesting party via Epic fax function and remove from pre-op pool.  Please call with questions.  Levi Aland, NP-C    04/04/2022, 9:20 AM [facility] [address], [address] Office [phone] Fax [phone]

## 2022-04-05 NOTE — Progress Notes (Signed)
   Established Patient Office Visit  Subjective   Patient ID: Sarah Kane, female   DOB: 08-31-1971 Age: 51 y.o. MRN: [id number]   No chief complaint on file.   HPI   ROS    Objective:    There were no vitals filed for this visit.   Physical Exam    No results found for this or any previous visit (from the past 24 hour(s)).   {Labs (Optional):23779}  The ASCVD Risk score (Arnett DK, et al., 2019) failed to calculate for the following reasons:   The patient has a prior MI or stroke diagnosis   Assessment & Plan:   No problem-specific Assessment & Plan notes found for this encounter.   No follow-ups on file.  ___________________________________________ [name], DNP, APRN, FNP-BC Primary Care and Sports Medicine [facility] [place]

## 2022-04-06 ENCOUNTER — Ambulatory Visit (INDEPENDENT_AMBULATORY_CARE_PROVIDER_SITE_OTHER): Payer: Self-pay | Admitting: Medical-Surgical

## 2022-04-06 DIAGNOSIS — E1165 Type 2 diabetes mellitus with hyperglycemia: Secondary | ICD-10-CM

## 2022-04-06 DIAGNOSIS — E785 Hyperlipidemia, unspecified: Secondary | ICD-10-CM

## 2022-04-06 DIAGNOSIS — Z91199 Patient's noncompliance with other medical treatment and regimen due to unspecified reason: Secondary | ICD-10-CM

## 2022-04-06 DIAGNOSIS — I10 Essential (primary) hypertension: Secondary | ICD-10-CM

## 2022-04-07 ENCOUNTER — Telehealth: Payer: Self-pay | Admitting: Medical-Surgical

## 2022-04-07 NOTE — Telephone Encounter (Signed)
Pt called.  She is requesting a refill on all of her meds.

## 2022-04-18 ENCOUNTER — Other Ambulatory Visit: Payer: Self-pay

## 2022-04-18 ENCOUNTER — Emergency Department (HOSPITAL_BASED_OUTPATIENT_CLINIC_OR_DEPARTMENT_OTHER)
Admission: EM | Admit: 2022-04-18 | Discharge: 2022-04-18 | Disposition: A | Discharge status: Home / Self Care | Payer: BC Managed Care – PPO | Attending: Emergency Medicine | Admitting: Emergency Medicine

## 2022-04-18 ENCOUNTER — Other Ambulatory Visit (HOSPITAL_BASED_OUTPATIENT_CLINIC_OR_DEPARTMENT_OTHER): Payer: Self-pay

## 2022-04-18 ENCOUNTER — Encounter (HOSPITAL_BASED_OUTPATIENT_CLINIC_OR_DEPARTMENT_OTHER): Payer: Self-pay

## 2022-04-18 DIAGNOSIS — Z79899 Other long term (current) drug therapy: Secondary | ICD-10-CM | POA: Diagnosis not present

## 2022-04-18 DIAGNOSIS — R21 Rash and other nonspecific skin eruption: Secondary | ICD-10-CM | POA: Diagnosis not present

## 2022-04-18 DIAGNOSIS — I2510 Atherosclerotic heart disease of native coronary artery without angina pectoris: Secondary | ICD-10-CM | POA: Diagnosis not present

## 2022-04-18 DIAGNOSIS — Z7982 Long term (current) use of aspirin: Secondary | ICD-10-CM | POA: Diagnosis not present

## 2022-04-18 DIAGNOSIS — E119 Type 2 diabetes mellitus without complications: Secondary | ICD-10-CM | POA: Diagnosis not present

## 2022-04-18 DIAGNOSIS — I10 Essential (primary) hypertension: Secondary | ICD-10-CM | POA: Diagnosis not present

## 2022-04-18 DIAGNOSIS — Z7984 Long term (current) use of oral hypoglycemic drugs: Secondary | ICD-10-CM | POA: Insufficient documentation

## 2022-04-18 LAB — CBG MONITORING, ED: Glucose-Capillary: 272 mg/dL — ABNORMAL HIGH (ref 70–99)

## 2022-04-18 MED ORDER — METHYLPREDNISOLONE 4 MG PO TBPK
ORAL_TABLET | ORAL | 0 refills | Status: DC
  Filled 2022-04-18: qty 21, 6d supply, fill #0

## 2022-04-18 MED ORDER — DEXAMETHASONE SODIUM PHOSPHATE 10 MG/ML IJ SOLN
10.0000 mg | Freq: Once | INTRAMUSCULAR | Status: AC
  Administered 2022-04-18: 10 mg via INTRAMUSCULAR
  Filled 2022-04-18: qty 1

## 2022-04-18 NOTE — Discharge Instructions (Signed)
Please return to the ED with any new or worsening signs or symptoms such as trouble breathing, trouble swallowing, lip or facial swelling Please follow-up with your family doctor for further management As we discussed, steroids will raise her blood sugar.  Please be checking her blood sugars consistently at home and pay close attention to the measurements. Please pick up Medrol Dosepak absent in for you and begin taking it tomorrow morning Please read attached guide concerning rashes

## 2022-04-18 NOTE — ED Notes (Signed)
Unable to obtain discharge vitals, discharged by provider.

## 2022-04-18 NOTE — ED Notes (Signed)
Informed provider of CBG, states ok to give decadron.

## 2022-04-18 NOTE — ED Triage Notes (Signed)
C/o generalized rash x 3 weeks. Denies pain/itching. Started at ears, then moved to arms & chest. Minimal rash on legs.

## 2022-04-18 NOTE — ED Provider Notes (Signed)
[facility] EMERGENCY DEPARTMENT Provider Note   CSN: [id number] Arrival date & time: 04/18/22  1137     History  Chief Complaint  Patient presents with   Rash    Sarah Kane is a 51 y.o. female with medical history significant for CAD, diabetes, diabetic retinopathy, hypertension.  The patient presents to the ED for evaluation of rash.  Patient states that 1 month ago she was stung in her left ear by a wasp.  The patient states that she had no shortness of breath, angioedema, anaphylaxis type symptoms when this occurred.  The patient denies any allergies to bees.  Patient states that 1 week later she began having diffuse raised erythematous rash along the left side of her neck.  The patient states that this rash then progressed to her left upper extremity.  Patient reports that the rash then progressed to her right upper extremity, her chest and trunk and then the upper portion of her lower extremities.  Patient denies that the rash itches, burns, stings.  Patient states that she has tried controlling the rash with calamine lotion, hydrocortisone cream to no avail.  Patient denies any occurrence of the same in the past.  The patient denies any history of eczema, psoriasis. The patient denies any new medications, lotions, shampoos, detergents.  Patient denies any fevers, nausea or vomiting, lip or facial swelling, trouble breathing, throat closure, trouble swallowing.   Rash Associated symptoms: no diarrhea, no fever, no nausea, no shortness of breath and not vomiting        Home Medications Prior to Admission medications   Medication Sig Start Date End Date Taking? Authorizing Provider  methylPREDNISolone (MEDROL DOSEPAK) 4 MG TBPK tablet Day 1: Take 2 tablets before breakfast, 1 tablet after lunch and after supper, 2 tablets at bedtime Day 2: Take 1 tablet for breakfast, 1 tablet after lunch and after supper, 2 tablets at bedtime Day 3: Take 1 tablet before breakfast and 1  tablet after lunch, after supper, and at bedtime Day 4: Take 1 tablet before breakfast, after lunch, and at bedtime Day 5: Take 1 tablet before breakfast and at bedtime Day 6: Take 1 tablet before breakfast 04/18/22  Yes Delice Bison F, PA-C  acetaminophen (TYLENOL) 500 MG tablet Take 1,000 mg by mouth every 6 (six) hours as needed for mild pain or headache.     [provider]  aspirin 81 MG EC tablet Take 1 tablet (81 mg total) by mouth daily. Swallow whole. 06/04/20   Pricilla Riffle, MD  atorvastatin (LIPITOR) 80 MG tablet Take 1 tablet (80 mg total) by mouth daily. 07/26/21   Pricilla Riffle, MD  gabapentin (NEURONTIN) 300 MG capsule Take 1 capsule (300 mg total) by mouth 3 (three) times daily. 01/02/22   Christen Butter, NP  glimepiride (AMARYL) 4 MG tablet TAKE 2 TABLETS BY MOUTH ONCE DAILY WITH BREAKFAST. 01/02/22   Christen Butter, NP  isosorbide mononitrate (IMDUR) 30 MG 24 hr tablet TAKE 1 & 1/2 (ONE & ONE-HALF) TABLETS BY MOUTH ONCE DAILY 07/26/21   Pricilla Riffle, MD  metoprolol succinate (TOPROL-XL) 25 MG 24 hr tablet Take 1 tablet (25 mg total) by mouth 2 (two) times daily with a meal. 07/26/21   Pricilla Riffle, MD  prazosin (MINIPRESS) 2 MG capsule Take 1 capsule (2 mg total) by mouth at bedtime. 01/02/22   Christen Butter, NP  sitaGLIPtin (JANUVIA) 100 MG tablet Take 1 tablet (100 mg total) by mouth daily. 01/02/22  Christen Butter, NP  ticagrelor (BRILINTA) 90 MG TABS tablet Take 1 tablet (90 mg total) by mouth 2 (two) times daily. 07/26/21   Pricilla Riffle, MD  traMADol-acetaminophen (ULTRACET) 37.5-325 MG tablet Take 1-2 tablets by mouth every 6 (six) hours as needed. 06/21/21   Eustace Moore, MD      Allergies    Nitroglycerin    Review of Systems   Review of Systems  Constitutional:  Negative for chills and fever.  HENT:  Negative for drooling, facial swelling and trouble swallowing.   Respiratory:  Negative for shortness of breath.   Gastrointestinal:  Negative for diarrhea, nausea  and vomiting.  Skin:  Positive for rash.  All other systems reviewed and are negative.   Physical Exam Updated Vital Signs BP (!) 146/101 (BP Location: Left Arm)   Pulse 85   Temp 98.6 F (37 C) (Oral)   Resp 16   Ht 5' (1.524 m)   Wt 83.9 kg   SpO2 98%   BMI 36.13 kg/m  Physical Exam Vitals and nursing note reviewed.  Constitutional:      General: She is not in acute distress.    Appearance: Normal appearance. She is not ill-appearing, toxic-appearing or diaphoretic.  HENT:     Head: Normocephalic and atraumatic.     Nose: Nose normal. No congestion.     Mouth/Throat:     Mouth: Mucous membranes are moist.     Pharynx: Oropharynx is clear.  Eyes:     Extraocular Movements: Extraocular movements intact.     Conjunctiva/sclera: Conjunctivae normal.     Pupils: Pupils are equal, round, and reactive to light.  Cardiovascular:     Rate and Rhythm: Normal rate and regular rhythm.  Pulmonary:     Effort: Pulmonary effort is normal.     Breath sounds: Normal breath sounds.  Abdominal:     General: Abdomen is flat. Bowel sounds are normal.     Palpations: Abdomen is soft.  Musculoskeletal:     Cervical back: Normal range of motion and neck supple. No tenderness.  Skin:    General: Skin is warm and dry.     Capillary Refill: Capillary refill takes less than 2 seconds.     Findings: Erythema and rash present. Rash is urticarial.     Comments: Raised erythematous rash to patient bilateral upper extremity forearms, chest, trunk, left side of neck.  This rash does blanch.  Neurological:     Mental Status: She is alert and oriented to person, place, and time.     ED Results / Procedures / Treatments   Labs (all labs ordered are listed, but only abnormal results are displayed) Labs Reviewed  CBG MONITORING, ED - Abnormal; Notable for the following components:      Result Value   Glucose-Capillary 272 (*)    All other components within normal limits     EKG None  Radiology No results found.  Procedures Procedures   Medications Ordered in ED Medications  dexamethasone (DECADRON) injection 10 mg (10 mg Intramuscular Given 04/18/22 1523)    ED Course/ Medical Decision Making/ A&P                           Medical Decision Making Risk Prescription drug management.   51 year old female presents to the ED for evaluation of rash.  Please see HPI for further details.  On examination, the patient is afebrile and nontachycardic.  The patient  lung sounds are clear bilaterally, she is not hypoxic on room air.  Patient abdomen is soft and compressible all 4 quadrants.  Patient posterior oropharynx is nonerythematous, her airway is intact.  The patient is handling her secretions appropriately, she is not drooling.  There is no evidence of anaphylaxis.  The patient has a diffuse erythematous and raised rash to her bilateral forearms, neck, chest and trunk.  This rash does blanch.  This rash does not itch, burn or sting according to the patient.  Patient will be given 10 mg Decadron shot and discharged home on a Medrol Dosepak.  The patient was advised that steroids will increase her blood sugar.  Her blood sugar here is 276.  I have discussed with the patient that she will need to be closely monitoring her blood sugar at home and she will need to follow-up with her family doctor for further management.  I discussed this patient and my plan of management with my attending Dr. Adela Lank who voices agreement with the plan of management.  Dr. Adela Lank is not concerned about her elevated blood sugar and states that we still placed her on steroids. The patient voices understanding of these instructions.  The patient had all of her questions answered to her satisfaction prior to discharge.  The patient was given return precautions and she voiced understanding of these.  The patient is stable for discharge at this time.  Final Clinical Impression(s) / ED  Diagnoses Final diagnoses:  Rash and nonspecific skin eruption    Rx / DC Orders ED Discharge Orders          Ordered    methylPREDNISolone (MEDROL DOSEPAK) 4 MG TBPK tablet        04/18/22 1542              Al Decant, PA-C 04/18/22 1543    Melene Plan, DO 04/19/22 1459

## 2022-04-19 ENCOUNTER — Telehealth: Payer: Self-pay | Admitting: General Practice

## 2022-04-19 ENCOUNTER — Emergency Department (HOSPITAL_BASED_OUTPATIENT_CLINIC_OR_DEPARTMENT_OTHER)
Admission: EM | Admit: 2022-04-19 | Discharge: 2022-04-19 | Disposition: A | Discharge status: Home / Self Care | Payer: BC Managed Care – PPO | Attending: Emergency Medicine | Admitting: Emergency Medicine

## 2022-04-19 ENCOUNTER — Other Ambulatory Visit: Payer: Self-pay

## 2022-04-19 ENCOUNTER — Encounter (HOSPITAL_BASED_OUTPATIENT_CLINIC_OR_DEPARTMENT_OTHER): Payer: Self-pay | Admitting: Pediatrics

## 2022-04-19 DIAGNOSIS — Z7982 Long term (current) use of aspirin: Secondary | ICD-10-CM | POA: Diagnosis not present

## 2022-04-19 DIAGNOSIS — Z79899 Other long term (current) drug therapy: Secondary | ICD-10-CM | POA: Insufficient documentation

## 2022-04-19 DIAGNOSIS — R739 Hyperglycemia, unspecified: Secondary | ICD-10-CM

## 2022-04-19 DIAGNOSIS — I10 Essential (primary) hypertension: Secondary | ICD-10-CM | POA: Diagnosis not present

## 2022-04-19 DIAGNOSIS — Z7984 Long term (current) use of oral hypoglycemic drugs: Secondary | ICD-10-CM | POA: Insufficient documentation

## 2022-04-19 DIAGNOSIS — E1165 Type 2 diabetes mellitus with hyperglycemia: Secondary | ICD-10-CM | POA: Diagnosis not present

## 2022-04-19 DIAGNOSIS — D72829 Elevated white blood cell count, unspecified: Secondary | ICD-10-CM | POA: Insufficient documentation

## 2022-04-19 DIAGNOSIS — E871 Hypo-osmolality and hyponatremia: Secondary | ICD-10-CM | POA: Diagnosis not present

## 2022-04-19 DIAGNOSIS — I2510 Atherosclerotic heart disease of native coronary artery without angina pectoris: Secondary | ICD-10-CM | POA: Insufficient documentation

## 2022-04-19 LAB — CBC
HCT: 44.9 % (ref 36.0–46.0)
Hemoglobin: 15.3 g/dL — ABNORMAL HIGH (ref 12.0–15.0)
MCH: 28.7 pg (ref 26.0–34.0)
MCHC: 34.1 g/dL (ref 30.0–36.0)
MCV: 84.1 fL (ref 80.0–100.0)
Platelets: 217 10*3/uL (ref 150–400)
RBC: 5.34 MIL/uL — ABNORMAL HIGH (ref 3.87–5.11)
RDW: 12.3 % (ref 11.5–15.5)
WBC: 10.8 10*3/uL — ABNORMAL HIGH (ref 4.0–10.5)
nRBC: 0 % (ref 0.0–0.2)

## 2022-04-19 LAB — I-STAT VENOUS BLOOD GAS, ED
Acid-Base Excess: 2 mmol/L (ref 0.0–2.0)
Bicarbonate: 24.9 mmol/L (ref 20.0–28.0)
Calcium, Ion: 1.08 mmol/L — ABNORMAL LOW (ref 1.15–1.40)
HCT: 45 % (ref 36.0–46.0)
Hemoglobin: 15.3 g/dL — ABNORMAL HIGH (ref 12.0–15.0)
O2 Saturation: 91 %
Potassium: 4.8 mmol/L (ref 3.5–5.1)
Sodium: 133 mmol/L — ABNORMAL LOW (ref 135–145)
TCO2: 26 mmol/L (ref 22–32)
pCO2, Ven: 31.8 mmHg — ABNORMAL LOW (ref 44–60)
pH, Ven: 7.501 — ABNORMAL HIGH (ref 7.25–7.43)
pO2, Ven: 54 mmHg — ABNORMAL HIGH (ref 32–45)

## 2022-04-19 LAB — CBG MONITORING, ED
Glucose-Capillary: 360 mg/dL — ABNORMAL HIGH (ref 70–99)
Glucose-Capillary: 342 mg/dL — ABNORMAL HIGH (ref 70–99)
Glucose-Capillary: 349 mg/dL — ABNORMAL HIGH (ref 70–99)
Glucose-Capillary: 292 mg/dL — ABNORMAL HIGH (ref 70–99)
Glucose-Capillary: 349 mg/dL — ABNORMAL HIGH (ref 70–99)

## 2022-04-19 LAB — URINALYSIS, ROUTINE W REFLEX MICROSCOPIC
Bilirubin Urine: NEGATIVE
Glucose, UA: >=500 mg/dL — AB
Hgb urine dipstick: NEGATIVE
Ketones, ur: NEGATIVE mg/dL
Leukocytes,Ua: NEGATIVE
Nitrite: NEGATIVE
Protein, ur: NEGATIVE mg/dL
Specific Gravity, Urine: 1.015 (ref 1.005–1.030)
pH: 6 (ref 5.0–8.0)

## 2022-04-19 LAB — URINALYSIS, MICROSCOPIC (REFLEX)

## 2022-04-19 LAB — BASIC METABOLIC PANEL
Anion gap: 11 (ref 5–15)
BUN: 14 mg/dL (ref 6–20)
CO2: 21 mmol/L — ABNORMAL LOW (ref 22–32)
Calcium: 9.4 mg/dL (ref 8.9–10.3)
Chloride: 102 mmol/L (ref 98–111)
Creatinine, Ser: 0.83 mg/dL (ref 0.44–1.00)
GFR, Estimated: >60 mL/min (ref >60)
Glucose, Bld: 382 mg/dL — ABNORMAL HIGH (ref 70–99)
Potassium: 4.2 mmol/L (ref 3.5–5.1)
Sodium: 134 mmol/L — ABNORMAL LOW (ref 135–145)

## 2022-04-19 LAB — DIFFERENTIAL
Abs Immature Granulocytes: 0.05 10*3/uL (ref 0.00–0.07)
Basophils Absolute: 0 10*3/uL (ref 0.0–0.1)
Basophils Relative: 0 %
Eosinophils Absolute: 0 10*3/uL (ref 0.0–0.5)
Eosinophils Relative: 0 %
Immature Granulocytes: 1 %
Lymphocytes Relative: 8 %
Lymphs Abs: 0.9 10*3/uL (ref 0.7–4.0)
Monocytes Absolute: 0.4 10*3/uL (ref 0.1–1.0)
Monocytes Relative: 4 %
Neutro Abs: 9.4 10*3/uL — ABNORMAL HIGH (ref 1.7–7.7)
Neutrophils Relative %: 87 %

## 2022-04-19 LAB — PREGNANCY, URINE: Preg Test, Ur: NEGATIVE

## 2022-04-19 MED ORDER — LACTATED RINGERS IV BOLUS
1000.0000 mL | Freq: Once | INTRAVENOUS | Status: AC
Start: 2022-04-19 — End: 2022-04-19
  Administered 2022-04-19: 1000 mL via INTRAVENOUS

## 2022-04-19 MED ORDER — LACTATED RINGERS IV BOLUS
1000.0000 mL | Freq: Once | INTRAVENOUS | Status: AC
  Administered 2022-04-19: 1000 mL via INTRAVENOUS

## 2022-04-19 MED ORDER — INSULIN ASPART 100 UNIT/ML IJ SOLN
10.0000 [IU] | Freq: Once | INTRAMUSCULAR | Status: AC
  Administered 2022-04-19: 10 [IU] via SUBCUTANEOUS

## 2022-04-19 NOTE — ED Provider Notes (Signed)
[facility] EMERGENCY DEPARTMENT Provider Note   CSN: [id number] Arrival date & time: 04/19/22  4098     History  Chief Complaint  Patient presents with   Hyperglycemia    Sarah Kane is a 51 y.o. female.   Hyperglycemia   Patient presents due to hyperglycemia.  Patient was seen yesterday in the ED due to rash, given a shot of steroids.  Her sugar was in the 500s this morning, called PCP today told to go to ED for DKA work-up.  Patient denies any nausea, vomiting, abdominal pain, chest pain, shortness of breath, vision loss, headaches.  She is on Januvia and glimepiride for diabetes.  Taking as prescribed.  The rash started 2 weeks ago, it is nonpruritic and not painful.  Started in her neck, went down her chest and is on the extensor surfaces of her upper extremities and on the thighs anteriorly.  PMH notable for CAD on Brilinta, hypertension, hyperlipidemia, diabetic retinopathy, type 2 diabetes  Home Medications Prior to Admission medications   Medication Sig Start Date End Date Taking? Authorizing Provider  acetaminophen (TYLENOL) 500 MG tablet Take 1,000 mg by mouth every 6 (six) hours as needed for mild pain or headache.     [provider]  aspirin 81 MG EC tablet Take 1 tablet (81 mg total) by mouth daily. Swallow whole. 06/04/20   Pricilla Riffle, MD  atorvastatin (LIPITOR) 80 MG tablet Take 1 tablet (80 mg total) by mouth daily. 07/26/21   Pricilla Riffle, MD  gabapentin (NEURONTIN) 300 MG capsule Take 1 capsule (300 mg total) by mouth 3 (three) times daily. 01/02/22   Christen Butter, NP  glimepiride (AMARYL) 4 MG tablet TAKE 2 TABLETS BY MOUTH ONCE DAILY WITH BREAKFAST. 01/02/22   Christen Butter, NP  isosorbide mononitrate (IMDUR) 30 MG 24 hr tablet TAKE 1 & 1/2 (ONE & ONE-HALF) TABLETS BY MOUTH ONCE DAILY 07/26/21   Pricilla Riffle, MD  methylPREDNISolone (MEDROL DOSEPAK) 4 MG TBPK tablet Take as directed on package 04/18/22   Al Decant, PA-C  metoprolol  succinate (TOPROL-XL) 25 MG 24 hr tablet Take 1 tablet (25 mg total) by mouth 2 (two) times daily with a meal. 07/26/21   Pricilla Riffle, MD  prazosin (MINIPRESS) 2 MG capsule Take 1 capsule (2 mg total) by mouth at bedtime. 01/02/22   Christen Butter, NP  sitaGLIPtin (JANUVIA) 100 MG tablet Take 1 tablet (100 mg total) by mouth daily. 01/02/22   Christen Butter, NP  ticagrelor (BRILINTA) 90 MG TABS tablet Take 1 tablet (90 mg total) by mouth 2 (two) times daily. 07/26/21   Pricilla Riffle, MD  traMADol-acetaminophen (ULTRACET) 37.5-325 MG tablet Take 1-2 tablets by mouth every 6 (six) hours as needed. 06/21/21   Eustace Moore, MD      Allergies    Nitroglycerin    Review of Systems   Review of Systems  Physical Exam Updated Vital Signs BP 129/88   Pulse 82   Temp 98.6 F (37 C) (Oral)   Resp 17   Ht 5' (1.524 m)   Wt 83.9 kg   SpO2 96%   BMI 36.13 kg/m  Physical Exam Vitals and nursing note reviewed. Exam conducted with a chaperone present.  Constitutional:      Appearance: Normal appearance.  HENT:     Head: Normocephalic and atraumatic.  Eyes:     General: No scleral icterus.       Right eye: No discharge.  Left eye: No discharge.     Extraocular Movements: Extraocular movements intact.     Pupils: Pupils are equal, round, and reactive to light.  Cardiovascular:     Rate and Rhythm: Normal rate and regular rhythm.     Pulses: Normal pulses.     Heart sounds: Normal heart sounds. No murmur heard.    No friction rub. No gallop.  Pulmonary:     Effort: Pulmonary effort is normal. No respiratory distress.     Breath sounds: Normal breath sounds.  Abdominal:     General: Abdomen is flat. Bowel sounds are normal. There is no distension.     Palpations: Abdomen is soft.     Tenderness: There is no abdominal tenderness.  Skin:    General: Skin is warm and dry.     Coloration: Skin is not jaundiced.     Findings: Rash present.     Comments: Macular rash on chest wall,  extensor surfaces of arms.  No palm or sole involvement, not on neck. No petechiae   Neurological:     Mental Status: She is alert. Mental status is at baseline.     Coordination: Coordination normal.     ED Results / Procedures / Treatments   Labs (all labs ordered are listed, but only abnormal results are displayed) Labs Reviewed  BASIC METABOLIC PANEL - Abnormal; Notable for the following components:      Result Value   Sodium 134 (*)    CO2 21 (*)    Glucose, Bld 382 (*)    All other components within normal limits  URINALYSIS, ROUTINE W REFLEX MICROSCOPIC - Abnormal; Notable for the following components:   Glucose, UA >=500 (*)    All other components within normal limits  DIFFERENTIAL - Abnormal; Notable for the following components:   Neutro Abs 9.4 (*)    All other components within normal limits  CBC - Abnormal; Notable for the following components:   WBC 10.8 (*)    RBC 5.34 (*)    Hemoglobin 15.3 (*)    All other components within normal limits  URINALYSIS, MICROSCOPIC (REFLEX) - Abnormal; Notable for the following components:   Bacteria, UA RARE (*)    All other components within normal limits  CBG MONITORING, ED - Abnormal; Notable for the following components:   Glucose-Capillary 360 (*)    All other components within normal limits  I-STAT VENOUS BLOOD GAS, ED - Abnormal; Notable for the following components:   pH, Ven 7.501 (*)    pCO2, Ven 31.8 (*)    pO2, Ven 54 (*)    Sodium 133 (*)    Calcium, Ion 1.08 (*)    Hemoglobin 15.3 (*)    All other components within normal limits  CBG MONITORING, ED - Abnormal; Notable for the following components:   Glucose-Capillary 342 (*)    All other components within normal limits  CBG MONITORING, ED - Abnormal; Notable for the following components:   Glucose-Capillary 349 (*)    All other components within normal limits  CBG MONITORING, ED - Abnormal; Notable for the following components:   Glucose-Capillary 349 (*)     All other components within normal limits  CBG MONITORING, ED - Abnormal; Notable for the following components:   Glucose-Capillary 292 (*)    All other components within normal limits  PREGNANCY, URINE  BETA-HYDROXYBUTYRIC ACID    EKG EKG Interpretation  Date/Time:  Wednesday April 19 2022 12:15:15 EDT Ventricular Rate:  85 PR  Interval:  172 QRS Duration: 84 QT Interval:  379 QTC Calculation: 451 R Axis:   65 Text Interpretation: Sinus rhythm Low voltage, precordial leads Confirmed by Ernie Avena ([id number]) on 04/19/2022 12:22:27 PM  Radiology No results found.  Procedures Procedures    Medications Ordered in ED Medications  lactated ringers bolus 1,000 mL (0 mLs Intravenous Stopped 04/19/22 1346)  insulin aspart (novoLOG) injection 10 Units (10 Units Subcutaneous Given 04/19/22 1348)  lactated ringers bolus 1,000 mL (0 mLs Intravenous Stopped 04/19/22 1547)    ED Course/ Medical Decision Making/ A&P                           Medical Decision Making Amount and/or Complexity of Data Reviewed Labs: ordered.  Risk Prescription drug management.   Patient presents due to hyperglycemia.  Differential includes not limited to DKA, dehydration, sepsis, AKI, hyperglycemia secondary to steroid usage.  On exam patient is well-appearing.  Lungs are clear to auscultation, not febrile.  Patient is not septic appearing.  BP 129/88   Pulse 82   Temp 98.6 F (37 C) (Oral)   Resp 17   Ht 5' (1.524 m)   Wt 83.9 kg   SpO2 96%   BMI 36.13 kg/m   I ordered and reviewed laboratory work-up.  Patient is hyperglycemic with initial CBG of 380.  CBC slows a slight leukocytosis of 10.8 with neutrophilic predominance.  She appears hemoconcentrated, not anemic.  The leukocytosis may be secondary to steroid usage given absence of infectious symptoms or findings on exam.  BMP shows mild hyponatremia at 134 and slight decrease in bicarb at 21.  There is no gross electrolyte derangement or AKI.   No signs of anion gap acidosis.  Not consistent with DKA.  VBG unremarkable.  UA with glucosuria but no infectious findings.  I ordered an EKG which does not show any signs of arrhythmia or reflect any signs of electrolyte derangement.  Patient is on cardiac monitoring and is in sinus rhythm with a rate of 82.  I ordered 2 L lactated Ringer's, 10 units of insulin.  Patient's vital CBG shows glucose of 292.  I considered admission for hyperglycemia but patient is asymptomatic and without signs of DKA.  I do not feel she needs admission, I did advise her to discontinue the steroids as I think that is contributing to the hyperglycemia.  She has a primary appointment on Monday, she is instructed to call her primary to see if they would like to see her sooner.  Patient advised to continue taking her home diabetes medicine, we discussed return precautions.  Case discussed with my attending who agrees with this plan.        Final Clinical Impression(s) / ED Diagnoses Final diagnoses:  Hyperglycemia    Rx / DC Orders ED Discharge Orders     None         Theron Arista, Cordelia Poche 04/19/22 1602    Ernie Avena, MD 04/19/22 [phone]

## 2022-04-19 NOTE — Telephone Encounter (Signed)
Transition Care Management Follow-up Telephone Call Date of discharge and from where: 04/18/22 from [facility] How have you been since you were released from the hospital? Still in pain. She was given a decadron shot yesterday. Today, around 8 am, she checked her CBG on [place] on glucometer was 573. Her CBG around 745 am was 484 and  Her CBG around 7:15 was 521. Her CBG right now at 9:18 (checked while I was on the phone with her) is 438.  I talked to Ander Slade , her PCP. Patient was advised to go to the E.R. to rule out DKA. Patient agreeable to plan. Hospital follow up appointment made for patient for 04/24/22. Patient has been advised to keep the appointment.  Any questions or concerns? No  Items Reviewed: Did the pt receive and understand the discharge instructions provided? Yes  Medications obtained and verified? No  Other? No  Any new allergies since your discharge? No  Dietary orders reviewed? Yes Do you have support at home? Yes   Home Care and Equipment/Supplies: Were home health services ordered? no  Functional Questionnaire: (I = Independent and D = Dependent) ADLs: I  Bathing/Dressing- I  Meal Prep- I  Eating- I  Maintaining continence- I  Transferring/Ambulation- I  Managing Meds- I  Follow up appointments reviewed:  PCP Hospital f/u appt confirmed? Yes  Scheduled to see Christen Butter, NP on 04/24/22 @ 1600. Specialist Hospital f/u appt confirmed? No   Are transportation arrangements needed? No  If their condition worsens, is the pt aware to call PCP or go to the Emergency Dept.? Yes Was the patient provided with contact information for the PCP's office or ED? Yes Was to pt encouraged to call back with questions or concerns? Yes

## 2022-04-19 NOTE — Discharge Instructions (Addendum)
You are seen today in the emergency department for hyperglycemia.  There were no signs of DKA on your work-up. you should stop taking the steroids as that is increasing the sugar.  Continue taking your home diabetic medicine.  Follow-up with your primary on Monday as planned.  Return to the ED for vomiting, fevers, inability to eat or drink, new or concerning symptoms.

## 2022-04-19 NOTE — ED Triage Notes (Signed)
C/O increased blood sugar; stated was sent by PCP for blood work and maybe insulin drip; Patient was given steroids yesterday along w/ prescription for rash.

## 2022-04-23 NOTE — Progress Notes (Unsigned)
   Established Patient Office Visit  Subjective   Patient ID: Sarah Kane, female   DOB: May 02, 1971 Age: 51 y.o. MRN: [id number]   No chief complaint on file.   HPI Pleasant 51 year old female presenting today for follow up after hospital evaluation and discharge.   ROS    Objective:    There were no vitals filed for this visit.   Physical Exam    No results found for this or any previous visit (from the past 24 hour(s)).   {Labs (Optional):23779}  The ASCVD Risk score (Arnett DK, et al., 2019) failed to calculate for the following reasons:   The patient has a prior MI or stroke diagnosis   Assessment & Plan:   No problem-specific Assessment & Plan notes found for this encounter.   No follow-ups on file.  ___________________________________________ [name], DNP, APRN, FNP-BC Primary Care and Sports Medicine [facility]

## 2022-04-24 ENCOUNTER — Ambulatory Visit (INDEPENDENT_AMBULATORY_CARE_PROVIDER_SITE_OTHER): Payer: BC Managed Care – PPO | Admitting: Medical-Surgical

## 2022-04-24 ENCOUNTER — Encounter: Payer: Self-pay | Admitting: Medical-Surgical

## 2022-04-24 VITALS — BP 116/75 | HR 90 | Resp 20 | Ht 60.0 in | Wt 194.6 lb

## 2022-04-24 DIAGNOSIS — E785 Hyperlipidemia, unspecified: Secondary | ICD-10-CM | POA: Diagnosis not present

## 2022-04-24 DIAGNOSIS — Z09 Encounter for follow-up examination after completed treatment for conditions other than malignant neoplasm: Secondary | ICD-10-CM

## 2022-04-24 DIAGNOSIS — I10 Essential (primary) hypertension: Secondary | ICD-10-CM

## 2022-04-24 DIAGNOSIS — E1165 Type 2 diabetes mellitus with hyperglycemia: Secondary | ICD-10-CM

## 2022-04-24 LAB — POCT GLYCOSYLATED HEMOGLOBIN (HGB A1C): HbA1c, POC (controlled diabetic range): 10.1 % — AB (ref 0.0–7.0)

## 2022-04-24 MED ORDER — TOUJEO MAX SOLOSTAR 300 UNIT/ML ~~LOC~~ SOPN: 10.0000 [IU] | PEN_INJECTOR | Freq: Every day | SUBCUTANEOUS | 0 refills | Status: DC

## 2022-04-24 MED ORDER — TRIAMCINOLONE ACETONIDE 0.1 % EX CREA: 1.0000 | TOPICAL_CREAM | Freq: Two times a day (BID) | CUTANEOUS | 0 refills | Status: DC

## 2022-04-24 MED ORDER — INSULIN PEN NEEDLE 31G X 5 MM MISC: 0 refills | Status: DC

## 2022-04-25 ENCOUNTER — Telehealth: Payer: Self-pay | Admitting: *Deleted

## 2022-04-25 NOTE — Chronic Care Management (AMB) (Signed)
  Care Management   Outreach Note  04/25/2022 Name: Sarah Kane MRN: [id number] DOB: Mar 05, 1971  An unsuccessful telephone outreach was attempted today. The patient was referred to the case management team for assistance with care management and care coordination.   Follow Up Plan:  A HIPAA compliant phone message was left for the patient providing contact information and requesting a return call.   Burman Nieves, CCMA Care Coordination Care Guide Direct Dial: [phone]

## 2022-05-01 ENCOUNTER — Encounter: Payer: Self-pay | Admitting: Medical-Surgical

## 2022-05-03 NOTE — Chronic Care Management (AMB) (Unsigned)
  Care Management   Outreach Note  05/03/2022 Name: Sarah Kane MRN: [id number] DOB: 03-01-1971  A second unsuccessful telephone outreach was attempted today. The patient was referred to the case management team for assistance with care management and care coordination.   Follow Up Plan:  A HIPAA compliant phone message was left for the patient providing contact information and requesting a return call.   Burman Nieves, CCMA Care Coordination Care Guide Direct Dial: [phone]

## 2022-05-04 ENCOUNTER — Ambulatory Visit: Payer: BC Managed Care – PPO | Admitting: Family Medicine

## 2022-05-04 NOTE — Chronic Care Management (AMB) (Signed)
  Care Coordination  Note  05/04/2022 Name: JAIMY KLIETHERMES MRN: [id number] DOB: Dec 31, 1970  PEGGYE POON is a 51 y.o. year old female who is a primary care patient of Christen Butter, NP. I reached out to [name] by phone today to offer care coordination services.      Ms. Fillingim was given information about Care Coordination services today including:  The Care Coordination services include support from the care team which includes your Nurse Coordinator, Clinical Social Worker, or Pharmacist.  The Care Coordination team is here to help remove barriers to the health concerns and goals most important to you. Care Coordination services are voluntary and the patient may decline or stop services at any time by request to their care team member.   Patient agreed to services and verbal consent obtained.   Follow up plan: Telephone appointment with care coordination team member scheduled for: 05/10/2022  Burman Nieves, [facility] Care Coordination Care Guide Direct Dial: [phone]

## 2022-05-10 ENCOUNTER — Ambulatory Visit: Payer: BC Managed Care – PPO | Admitting: Pharmacist

## 2022-05-10 ENCOUNTER — Encounter: Payer: Self-pay | Admitting: Pharmacist

## 2022-05-10 DIAGNOSIS — E1165 Type 2 diabetes mellitus with hyperglycemia: Secondary | ICD-10-CM

## 2022-05-10 DIAGNOSIS — E785 Hyperlipidemia, unspecified: Secondary | ICD-10-CM

## 2022-05-10 DIAGNOSIS — I10 Essential (primary) hypertension: Secondary | ICD-10-CM

## 2022-05-10 NOTE — Progress Notes (Signed)
Chronic Care Management Pharmacy Note  05/10/2022 Name:  Sarah Kane MRN:  [id number] DOB:  1971-06-29  Summary: addressed HTN, HLD, and primarily DM + medication access. Working extensively with patient to obtain medications consistently and affordably. Patient called her insurance and was enrolled in the "my agile life" program through [employer]. This will bring most oral diabetes medications down to $2, and insulin down to $50.   Recommendations/Changes made from today's visit: - Requested patient check BG AT LEAST 2x per week, on empty stomach as well as 1-2 hr after food, document, and we will discuss in 2 weeks. - Plan for optimizing toujeo dosing to achieve BG control, then will facilitate patient to get back on ozempic, titrate ozempic dosing increase as tolerated, and decrease/eventually discontinue insulin altogether.  Plan: f/u with pharmacist in 2 weeks  Subjective: Sarah Kane is an 51 y.o. year old female who is a primary patient of Samuel Bouche, NP.  The CCM team was consulted for assistance with disease management and care coordination needs.    Engaged with patient by telephone for initial visit in response to provider referral for pharmacy case management and/or care coordination services.   Consent to Services:  The patient was given information about Chronic Care Management services, agreed to services, and gave verbal consent prior to initiation of services.  Please see initial visit note for detailed documentation.   Patient Care Team: Samuel Bouche, NP as PCP - General (Nurse Practitioner) Fay Records, MD as PCP - Cardiology (Cardiology) Darius Bump, [facility] (Pharmacist)   Objective:  Lab Results  Component Value Date   CREATININE 0.83 04/19/2022   CREATININE 1.23 (H) 01/18/2022   CREATININE 0.83 01/02/2022    Lab Results  Component Value Date   HGBA1C 10.1 (A) 04/24/2022       Component Value Date/Time   CHOL 154 01/02/2022 0000   CHOL 135  06/18/2020 1226   TRIG 236 (H) 01/02/2022 0000   HDL 56 01/02/2022 0000   HDL 49 06/18/2020 1226   CHOLHDL 2.8 01/02/2022 0000   VLDL 30 05/21/2020 0438   LDLCALC 69 01/02/2022 0000       Latest Ref Rng & Units 01/02/2022   12:00 AM 03/10/2021   11:16 AM 12/13/2020    7:36 AM  Hepatic Function  Total Protein 6.1 - 8.1 g/dL 7.0  7.7  6.4   Albumin 3.5 - 5.0 g/dL  4.0    AST 10 - 35 U/L 17  28  14    ALT 6 - 29 U/L 27  29  23    Alk Phosphatase 38 - 126 U/L  77    Total Bilirubin 0.2 - 1.2 mg/dL 0.4  0.3  0.4     Lab Results  Component Value Date/Time   TSH 1.494 05/20/2020 09:06 PM       Latest Ref Rng & Units 04/19/2022   12:11 PM 04/19/2022   12:00 PM 01/18/2022   12:32 AM  CBC  WBC 4.0 - 10.5 K/uL  10.8  11.1   Hemoglobin 12.0 - 15.0 g/dL 15.3  15.3  15.8   Hematocrit 36.0 - 46.0 % 45.0  44.9  46.7   Platelets 150 - 400 K/uL  217  214     Lab Results  Component Value Date/Time   VD25OH 72.19 04/02/2018 12:00 AM    Clinical ASCVD: Yes  The ASCVD Risk score (Arnett DK, et al., 2019) failed to calculate for the following reasons:  The patient has a prior MI or stroke diagnosis    Other: (CHADS2VASc if Afib, PHQ9 if depression, MMRC or CAT for COPD, ACT, DEXA)  Social History   Tobacco Use  Smoking Status Never  Smokeless Tobacco Never   BP Readings from Last 3 Encounters:  04/24/22 116/75  04/19/22 129/88  04/18/22 (!) 146/101   Pulse Readings from Last 3 Encounters:  04/24/22 90  04/19/22 82  04/18/22 85   Wt Readings from Last 3 Encounters:  04/24/22 194 lb 9.6 oz (88.3 kg)  04/19/22 185 lb (83.9 kg)  04/18/22 185 lb (83.9 kg)    Assessment: Review of patient past medical history, allergies, medications, health status, including review of consultants reports, laboratory and other test data, was performed as part of comprehensive evaluation and provision of chronic care management services.   SDOH:  (Social Determinants of Health) assessments and  interventions performed:    CCM Care Plan  Allergies  Allergen Reactions   Nitroglycerin     Blood pressure dropped, was hospitalized     Medications Reviewed Today     Reviewed by Darius Bump, [facility] (Pharmacist) on 05/10/22 at [phone]  Med List Status: <None>   Medication Order Taking? Sig Documenting Provider Last Dose Status Informant  acetaminophen (TYLENOL) 500 MG tablet [id number] Yes Take 1,000 mg by mouth every 6 (six) hours as needed for mild pain or headache.  [provider] Taking Active Self  aspirin 81 MG EC tablet [id number] Yes Take 1 tablet (81 mg total) by mouth daily. Swallow whole. Fay Records, MD Taking Active Self  atorvastatin (LIPITOR) 80 MG tablet [id number] Yes Take 1 tablet (80 mg total) by mouth daily. Fay Records, MD Taking Active   gabapentin (NEURONTIN) 300 MG capsule [id number] Yes Take 1 capsule (300 mg total) by mouth 3 (three) times daily. Samuel Bouche, NP Taking Active   glimepiride (AMARYL) 4 MG tablet [id number] Yes TAKE 2 TABLETS BY MOUTH ONCE DAILY WITH BREAKFAST. Samuel Bouche, NP Taking Active   insulin glargine, 2 Unit Dial, (TOUJEO MAX SOLOSTAR) 300 UNIT/ML Solostar Pen [id number] Yes Inject 10 Units into the skin daily. Samuel Bouche, NP Taking Active   Insulin Pen Needle 31G X 5 MM MISC [id number] Yes Use one needle with Toujeo insulin pen to inject insulin once daily. Samuel Bouche, NP Taking Active   isosorbide mononitrate (IMDUR) 30 MG 24 hr tablet [id number] Yes TAKE 1 & 1/2 (ONE & ONE-HALF) TABLETS BY MOUTH ONCE DAILY Fay Records, MD Taking Active   metoprolol succinate (TOPROL-XL) 25 MG 24 hr tablet [id number] Yes Take 1 tablet (25 mg total) by mouth 2 (two) times daily with a meal. Fay Records, MD Taking Active   prazosin (MINIPRESS) 2 MG capsule [id number] Yes Take 1 capsule (2 mg total) by mouth at bedtime. Samuel Bouche, NP Taking Active   sitaGLIPtin (JANUVIA) 100 MG tablet [id number] Yes Take 1 tablet (100 mg total) by mouth daily.  Samuel Bouche, NP Taking Active   ticagrelor (BRILINTA) 90 MG TABS tablet [id number] Yes Take 1 tablet (90 mg total) by mouth 2 (two) times daily. Fay Records, MD Taking Active   Patient not taking:  Discontinued 05/10/22 0949 (Patient Preference)             Patient Active Problem List   Diagnosis Date Noted   Bilateral shoulder pain 09/08/2020   Unstable angina [facility])    Hospital discharge follow-up 06/10/2020   Anxiety with depression 06/10/2020  Dehydration 06/10/2020   Hyperlipidemia LDL goal <70 05/22/2020   CAD (coronary artery disease) 05/22/2020   Ischemic cardiomyopathy 05/22/2020   NSTEMI (non-ST elevated myocardial infarction) ([place]) 05/20/2020   Type 2 diabetes mellitus with hyperglycemia, without long-term current use of insulin ([place]) 10/23/2019   Pain of skin 08/23/2019   Essential hypertension 10/29/2018   Tear of left supraspinatus tendon 08/14/2018   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   Dupuytren's contracture of both hands 07/26/2018   Chronic pain 06/28/2018   ANA positive 06/28/2018   Arthritis of left acromioclavicular joint 10/03/2017   Subacromial bursitis 10/03/2017   Carpal tunnel syndrome 03/06/2017   Right hand pain 10/02/2016   Complex regional pain syndrome 06/16/2016   Trigger finger of right thumb 03/03/2016   Joint synovitis 02/07/2016    Immunization History  Administered Date(s) Administered   PFIZER(Purple Top)SARS-COV-2 Vaccination 01/06/2020, 01/27/2020   Pneumococcal Polysaccharide-23 10/02/2016    Conditions to be addressed/monitored: HTN, HLD, and DMII  Care Plan : Medication Management  Updates made by Darius Bump, [place] since 05/10/2022 12:00 AM     Problem: DM, HTN, HLD      Long-Range Goal: Disease Progression Prevention   Start Date: 05/10/2022  This Visit's Progress: On track  Priority: High  Note:   Current Barriers:  Unable to independently afford treatment regimen Unable to independently monitor  therapeutic efficacy Unable to achieve control of diabetes  Suboptimal therapeutic regimen for diabetes  Pharmacist Clinical Goal(s):  Over the next 14 days, patient will achieve adherence to monitoring guidelines and medication adherence to achieve therapeutic efficacy achieve control of diabetes as evidenced by tracking blood glucose, adjusting insulin dosage, and future a1c through collaboration with PharmD and provider.   Interventions: 1:1 collaboration with Samuel Bouche, NP regarding development and update of comprehensive plan of care as evidenced by provider attestation and co-signature Inter-disciplinary care team collaboration (see longitudinal plan of care) Comprehensive medication review performed; medication list updated in electronic medical record  Diabetes:  Uncontrolled; current treatment:glimepiride 89m daily, januvia 1055mdaily;   Current glucose readings: not consistently checking, was 246 about 2hr postprandial during our call  Denies hypoglycemic/hyperglycemic symptoms  Current meal patterns: to be discussed at future visits   Current exercise:to be discussed at future visits  Counseled on s/sx of high and low blood sugar, glycemic goals, Recommended patient check blood glucose AT LEAST 2 times per week on empty stomach and 1-2hr after food, document, and provide for next discussion ,  Hypertension:  Controlled; current treatment:metoprolol XL 2574mID, imdur 82m9ming 1.5 tablets daily (regimen by cardiology);   Current home readings: to be discussed at future visits  Denies hypotensive/hypertensive symptoms  Recommended continue current regimen Hyperlipidemia:  Controlled; current treatment:atorvastatin 80mg71mly; LDL 69, TG 236  Recommended continue current regimen   Patient Goals/Self-Care Activities Over the next 14 days, patient will:  take medications as prescribed, check glucose 2x per week, document, and provide at future appointments, and collaborate  with provider on medication access solutions  Follow Up Plan: Telephone follow up appointment with care management team member scheduled for:  2 weeks      Medication Assistance:  TBD  Patient's preferred pharmacy is:  [facility] [id number]: [id number]: [phone]  [place] - [address] AT [place] [address] [place]  [address] Phone: [phone] Fax: [phone]  [facility] [address] [place] [address] Phone: [phone] Fax: [phone]   Follow Up:  Patient agrees to Care Plan and Follow-up.  Plan: Telephone follow up appointment with care management team member scheduled for:  2 weeks  Larinda Buttery, PharmD Clinical Pharmacist [facility] [phone]

## 2022-05-10 NOTE — Patient Instructions (Signed)
Visit Information   Thank you for taking time to visit with me today. Please don't hesitate to contact me if I can be of assistance to you before our next scheduled telephone appointment.  Following are the goals we discussed today:   Patient Goals/Self-Care Activities Over the next 14 days, patient will:  take medications as prescribed, check glucose 2x per week, document, and provide at future appointments, and collaborate with provider on medication access solutions  Follow Up Plan: Telephone follow up appointment with care management team member scheduled for:  2 weeks   Please call the care guide team at [phone] if you need to cancel or reschedule your appointment.   If you are experiencing a Mental Health or [place] or need someone to talk to, please call 1-800-273-TALK (toll free, 24 hour hotline)   Following is a copy of your full care plan:  Care Plan : Medication Management  Updates made by Darius Bump, [place] since 05/10/2022 12:00 AM     Problem: DM, HTN, HLD      Long-Range Goal: Disease Progression Prevention   Start Date: 05/10/2022  This Visit's Progress: On track  Priority: High  Note:   Current Barriers:  Unable to independently afford treatment regimen Unable to independently monitor therapeutic efficacy Unable to achieve control of diabetes  Suboptimal therapeutic regimen for diabetes  Pharmacist Clinical Goal(s):  Over the next 14 days, patient will achieve adherence to monitoring guidelines and medication adherence to achieve therapeutic efficacy achieve control of diabetes as evidenced by tracking blood glucose, adjusting insulin dosage, and future a1c through collaboration with PharmD and provider.   Interventions: 1:1 collaboration with Samuel Bouche, NP regarding development and update of comprehensive plan of care as evidenced by provider attestation and co-signature Inter-disciplinary care team collaboration (see longitudinal  plan of care) Comprehensive medication review performed; medication list updated in electronic medical record  Diabetes:  Uncontrolled; current treatment:glimepiride 80m daily, januvia 1011mdaily;   Current glucose readings: not consistently checking, was 246 about 2hr postprandial during our call  Denies hypoglycemic/hyperglycemic symptoms  Current meal patterns: to be discussed at future visits   Current exercise:to be discussed at future visits  Counseled on s/sx of high and low blood sugar, glycemic goals, Recommended patient check blood glucose AT LEAST 2 times per week on empty stomach and 1-2hr after food, document, and provide for next discussion ,  Hypertension:  Controlled; current treatment:metoprolol XL 2548mID, imdur 36m72ming 1.5 tablets daily (regimen by cardiology);   Current home readings: to be discussed at future visits  Denies hypotensive/hypertensive symptoms  Recommended continue current regimen Hyperlipidemia:  Controlled; current treatment:atorvastatin 80mg62mly; LDL 69, TG 236  Recommended continue current regimen   Patient Goals/Self-Care Activities Over the next 14 days, patient will:  take medications as prescribed, check glucose 2x per week, document, and provide at future appointments, and collaborate with provider on medication access solutions  Follow Up Plan: Telephone follow up appointment with care management team member scheduled for:  2 weeks      Consent to CCM Services: Ms. Stowell Foodygiven information about Chronic Care Management services including:  CCM service includes personalized support from designated clinical staff supervised by her physician, including individualized plan of care and coordination with other care providers 24/7 contact phone numbers for assistance for urgent and routine care needs. Service will only be billed when office clinical staff spend 20 minutes or more in a month to coordinate care. Only one  practitioner may  furnish and bill the service in a calendar month. The patient may stop CCM services at any time (effective at the end of the month) by phone call to the office staff. The patient will be responsible for cost sharing (co-pay) of up to 20% of the service fee (after annual deductible is met).  Patient agreed to services and verbal consent obtained.   Patient verbalizes understanding of instructions and care plan provided today and agrees to view in [place]. Active MyChart status and patient understanding of how to access instructions and care plan via MyChart confirmed with patient.     Telephone follow up appointment with care management team member scheduled for: 2 weeks  Darius Bump

## 2022-05-12 ENCOUNTER — Ambulatory Visit: Payer: BC Managed Care – PPO | Admitting: Medical-Surgical

## 2022-05-12 ENCOUNTER — Ambulatory Visit: Payer: BC Managed Care – PPO | Admitting: Family Medicine

## 2022-05-19 DIAGNOSIS — W01198A Fall on same level from slipping, tripping and stumbling with subsequent striking against other object, initial encounter: Secondary | ICD-10-CM | POA: Diagnosis not present

## 2022-05-19 DIAGNOSIS — Z791 Long term (current) use of non-steroidal anti-inflammatories (NSAID): Secondary | ICD-10-CM | POA: Diagnosis not present

## 2022-05-19 DIAGNOSIS — S93492A Sprain of other ligament of left ankle, initial encounter: Secondary | ICD-10-CM | POA: Diagnosis not present

## 2022-05-19 DIAGNOSIS — S90122A Contusion of left lesser toe(s) without damage to nail, initial encounter: Secondary | ICD-10-CM | POA: Diagnosis not present

## 2022-05-19 DIAGNOSIS — Z888 Allergy status to other drugs, medicaments and biological substances status: Secondary | ICD-10-CM | POA: Diagnosis not present

## 2022-05-19 DIAGNOSIS — M25572 Pain in left ankle and joints of left foot: Secondary | ICD-10-CM | POA: Diagnosis not present

## 2022-05-19 DIAGNOSIS — M79672 Pain in left foot: Secondary | ICD-10-CM | POA: Diagnosis not present

## 2022-05-19 DIAGNOSIS — Z7982 Long term (current) use of aspirin: Secondary | ICD-10-CM | POA: Diagnosis not present

## 2022-05-19 DIAGNOSIS — M79675 Pain in left toe(s): Secondary | ICD-10-CM | POA: Diagnosis not present

## 2022-05-22 ENCOUNTER — Telehealth: Payer: Self-pay | Admitting: General Practice

## 2022-05-22 NOTE — Telephone Encounter (Signed)
Transition Care Management Unsuccessful Follow-up Telephone Call  Date of discharge and from where:  05/19/22 from [facility]  Attempts:  1st Attempt  Reason for unsuccessful TCM follow-up call:  No answer/busy

## 2022-05-24 ENCOUNTER — Telehealth: Payer: BC Managed Care – PPO

## 2022-05-24 ENCOUNTER — Telehealth: Payer: Self-pay | Admitting: Pharmacist

## 2022-05-24 NOTE — Progress Notes (Deleted)
Chronic Care Management Pharmacy Note  05/24/2022 Name:  Sarah Kane MRN:  [id number] DOB:  08/13/1971  Summary: addressed HTN, HLD, and primarily DM + medication access. Working extensively with patient to obtain medications consistently and affordably. Patient called her insurance and was enrolled in the "my agile life" program through [employer]. This will bring most oral diabetes medications down to $2, and insulin down to $50.   Recommendations/Changes made from today's visit: - Requested patient check BG AT LEAST 2x per week, on empty stomach as well as 1-2 hr after food, document, and we will discuss in 2 weeks. - Plan for optimizing toujeo dosing to achieve BG control, then will facilitate patient to get back on ozempic, titrate ozempic dosing increase as tolerated, and decrease/eventually discontinue insulin altogether.  Plan: f/u with pharmacist in 2 weeks  Subjective: Sarah Kane is an 51 y.o. year old female who is a primary patient of Samuel Bouche, NP.  The CCM team was consulted for assistance with disease management and care coordination needs.    Engaged with patient by telephone for initial visit in response to provider referral for pharmacy case management and/or care coordination services.   Consent to Services:  The patient was given information about Chronic Care Management services, agreed to services, and gave verbal consent prior to initiation of services.  Please see initial visit note for detailed documentation.   Patient Care Team: Samuel Bouche, NP as PCP - General (Nurse Practitioner) Fay Records, MD as PCP - Cardiology (Cardiology) Darius Bump, [facility] (Pharmacist)   Objective:  Lab Results  Component Value Date   CREATININE 0.83 04/19/2022   CREATININE 1.23 (H) 01/18/2022   CREATININE 0.83 01/02/2022    Lab Results  Component Value Date   HGBA1C 10.1 (A) 04/24/2022       Component Value Date/Time   CHOL 154 01/02/2022 0000   CHOL 135  06/18/2020 1226   TRIG 236 (H) 01/02/2022 0000   HDL 56 01/02/2022 0000   HDL 49 06/18/2020 1226   CHOLHDL 2.8 01/02/2022 0000   VLDL 30 05/21/2020 0438   LDLCALC 69 01/02/2022 0000       Latest Ref Rng & Units 01/02/2022   12:00 AM 03/10/2021   11:16 AM 12/13/2020    7:36 AM  Hepatic Function  Total Protein 6.1 - 8.1 g/dL 7.0  7.7  6.4   Albumin 3.5 - 5.0 g/dL  4.0    AST 10 - 35 U/L 17  28  14    ALT 6 - 29 U/L 27  29  23    Alk Phosphatase 38 - 126 U/L  77    Total Bilirubin 0.2 - 1.2 mg/dL 0.4  0.3  0.4     Lab Results  Component Value Date/Time   TSH 1.494 05/20/2020 09:06 PM       Latest Ref Rng & Units 04/19/2022   12:11 PM 04/19/2022   12:00 PM 01/18/2022   12:32 AM  CBC  WBC 4.0 - 10.5 K/uL  10.8  11.1   Hemoglobin 12.0 - 15.0 g/dL 15.3  15.3  15.8   Hematocrit 36.0 - 46.0 % 45.0  44.9  46.7   Platelets 150 - 400 K/uL  217  214     Lab Results  Component Value Date/Time   VD25OH 72.19 04/02/2018 12:00 AM    Clinical ASCVD: Yes  The ASCVD Risk score (Arnett DK, et al., 2019) failed to calculate for the following reasons:  The patient has a prior MI or stroke diagnosis    Other: (CHADS2VASc if Afib, PHQ9 if depression, MMRC or CAT for COPD, ACT, DEXA)  Social History   Tobacco Use  Smoking Status Never  Smokeless Tobacco Never   BP Readings from Last 3 Encounters:  04/24/22 116/75  04/19/22 129/88  04/18/22 (!) 146/101   Pulse Readings from Last 3 Encounters:  04/24/22 90  04/19/22 82  04/18/22 85   Wt Readings from Last 3 Encounters:  04/24/22 194 lb 9.6 oz (88.3 kg)  04/19/22 185 lb (83.9 kg)  04/18/22 185 lb (83.9 kg)    Assessment: Review of patient past medical history, allergies, medications, health status, including review of consultants reports, laboratory and other test data, was performed as part of comprehensive evaluation and provision of chronic care management services.   SDOH:  (Social Determinants of Health) assessments and  interventions performed:    CCM Care Plan  Allergies  Allergen Reactions   Nitroglycerin     Blood pressure dropped, was hospitalized     Medications Reviewed Today     Reviewed by Darius Bump, [facility] (Pharmacist) on 05/10/22 at [phone]  Med List Status: <None>   Medication Order Taking? Sig Documenting Provider Last Dose Status Informant  acetaminophen (TYLENOL) 500 MG tablet [id number] Yes Take 1,000 mg by mouth every 6 (six) hours as needed for mild pain or headache.  [provider] Taking Active Self  aspirin 81 MG EC tablet [id number] Yes Take 1 tablet (81 mg total) by mouth daily. Swallow whole. Fay Records, MD Taking Active Self  atorvastatin (LIPITOR) 80 MG tablet [id number] Yes Take 1 tablet (80 mg total) by mouth daily. Fay Records, MD Taking Active   gabapentin (NEURONTIN) 300 MG capsule [id number] Yes Take 1 capsule (300 mg total) by mouth 3 (three) times daily. Samuel Bouche, NP Taking Active   glimepiride (AMARYL) 4 MG tablet [id number] Yes TAKE 2 TABLETS BY MOUTH ONCE DAILY WITH BREAKFAST. Samuel Bouche, NP Taking Active   insulin glargine, 2 Unit Dial, (TOUJEO MAX SOLOSTAR) 300 UNIT/ML Solostar Pen [id number] Yes Inject 10 Units into the skin daily. Samuel Bouche, NP Taking Active   Insulin Pen Needle 31G X 5 MM MISC [id number] Yes Use one needle with Toujeo insulin pen to inject insulin once daily. Samuel Bouche, NP Taking Active   isosorbide mononitrate (IMDUR) 30 MG 24 hr tablet [id number] Yes TAKE 1 & 1/2 (ONE & ONE-HALF) TABLETS BY MOUTH ONCE DAILY Fay Records, MD Taking Active   metoprolol succinate (TOPROL-XL) 25 MG 24 hr tablet [id number] Yes Take 1 tablet (25 mg total) by mouth 2 (two) times daily with a meal. Fay Records, MD Taking Active   prazosin (MINIPRESS) 2 MG capsule [id number] Yes Take 1 capsule (2 mg total) by mouth at bedtime. Samuel Bouche, NP Taking Active   sitaGLIPtin (JANUVIA) 100 MG tablet [id number] Yes Take 1 tablet (100 mg total) by mouth daily.  Samuel Bouche, NP Taking Active   ticagrelor (BRILINTA) 90 MG TABS tablet [id number] Yes Take 1 tablet (90 mg total) by mouth 2 (two) times daily. Fay Records, MD Taking Active   Patient not taking:  Discontinued 05/10/22 0949 (Patient Preference)             Patient Active Problem List   Diagnosis Date Noted   Bilateral shoulder pain 09/08/2020   Unstable angina [facility])    Hospital discharge follow-up 06/10/2020   Anxiety with depression 06/10/2020  Dehydration 06/10/2020   Hyperlipidemia LDL goal <70 05/22/2020   CAD (coronary artery disease) 05/22/2020   Ischemic cardiomyopathy 05/22/2020   NSTEMI (non-ST elevated myocardial infarction) ([place]) 05/20/2020   Type 2 diabetes mellitus with hyperglycemia, without long-term current use of insulin ([place]) 10/23/2019   Pain of skin 08/23/2019   Essential hypertension 10/29/2018   Tear of left supraspinatus tendon 08/14/2018   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   Dupuytren's contracture of both hands 07/26/2018   Chronic pain 06/28/2018   ANA positive 06/28/2018   Arthritis of left acromioclavicular joint 10/03/2017   Subacromial bursitis 10/03/2017   Carpal tunnel syndrome 03/06/2017   Right hand pain 10/02/2016   Complex regional pain syndrome 06/16/2016   Trigger finger of right thumb 03/03/2016   Joint synovitis 02/07/2016    Immunization History  Administered Date(s) Administered   PFIZER(Purple Top)SARS-COV-2 Vaccination 01/06/2020, 01/27/2020   Pneumococcal Polysaccharide-23 10/02/2016    Conditions to be addressed/monitored: HTN, HLD, and DMII  There are no care plans that you recently modified to display for this patient.    Medication Assistance:  TBD  Patient's preferred pharmacy is:  [facility], [place] [address] Phone: [phone] Fax: [place] [address] [place] [address] Phone: [phone] Fax: [facility] #[phone] - [place] - [address] AT [place] [address] [place] [address] Phone: [phone] Fax: [phone]  [facility] [address], [place] [address] Phone: [phone] Fax: [phone]   Follow Up:  Patient agrees to Care Plan and Follow-up.  Plan: Telephone follow up appointment with care management team member scheduled for:  2 weeks  Larinda Buttery, PharmD Clinical Pharmacist [facility] [phone]

## 2022-05-24 NOTE — Telephone Encounter (Signed)
Transition Care Management Unsuccessful Follow-up Telephone Call  Date of discharge and from where:  05/19/22 from [facility]  Attempts:  2nd Attempt  Reason for unsuccessful TCM follow-up call:  No answer/busy

## 2022-05-24 NOTE — Telephone Encounter (Signed)
Unsuccessful attempt x4 to contact patient for follow-up phone call for CCM services with pharmacist. Unable to leave voicemail, went to busy tone/hangup signal.  Will route to schedule team for reschedule.   Lynnda Shields, PharmD Clinical Pharmacist [facility] [phone]

## 2022-05-25 NOTE — Chronic Care Management (AMB) (Signed)
  Care Coordination Note  05/25/2022 Name: KARYS MECKLEY MRN: [id number] DOB: 22-Oct-1970  ROSHANDA BALAZS is a 51 y.o. year old female who is a primary care patient of Christen Butter, NP and is actively engaged with the care management team. I reached out to [name] by phone today to assist with re-scheduling a follow up visit with the Pharmacist  Follow up plan: Telephone appointment with care management team member scheduled for:05/26/2022  Burman Nieves, [facility] Care Coordination Care Guide Direct Dial: [phone]

## 2022-05-26 ENCOUNTER — Telehealth: Payer: BC Managed Care – PPO

## 2022-05-26 ENCOUNTER — Telehealth: Payer: Self-pay | Admitting: Pharmacist

## 2022-05-26 ENCOUNTER — Other Ambulatory Visit: Payer: Self-pay

## 2022-05-26 MED ORDER — METOPROLOL SUCCINATE ER 25 MG PO TB24: 25.0000 mg | ORAL_TABLET | Freq: Two times a day (BID) | ORAL | 2 refills | Status: DC

## 2022-05-26 NOTE — Telephone Encounter (Signed)
Unsuccessful attempt x3 to contact patient for follow-up phone call for CCM services with pharmacist. Patient works 3rd shift, so there is a scheduling barrier to successful outreach.  Rescheduled for 05/30/22 at 9am for another attempt.  Lynnda Shields, PharmD Clinical Pharmacist [facility] [phone]

## 2022-05-26 NOTE — Telephone Encounter (Signed)
Transition Care Management Unsuccessful Follow-up Telephone Call  Date of discharge and from where:  05/19/22 from [facility]  Attempts:  3rd Attempt  Reason for unsuccessful TCM follow-up call:  No answer/busy

## 2022-05-30 ENCOUNTER — Ambulatory Visit: Payer: BC Managed Care – PPO | Admitting: Pharmacist

## 2022-05-30 DIAGNOSIS — E785 Hyperlipidemia, unspecified: Secondary | ICD-10-CM

## 2022-05-30 DIAGNOSIS — E1165 Type 2 diabetes mellitus with hyperglycemia: Secondary | ICD-10-CM

## 2022-05-30 DIAGNOSIS — I10 Essential (primary) hypertension: Secondary | ICD-10-CM

## 2022-05-30 NOTE — Progress Notes (Signed)
Chronic Care Management Pharmacy Note  05/30/2022 Name:  Sarah Kane MRN:  [id number] DOB:  1971-08-03  Summary: addressed HTN, HLD, and primarily DM + medication access. Patient  is enrolled in the  "my agile life" program through [facility], which will bring most oral diabetes medications down to $2, and insulin down to $50. She states she still needs to call her pharmacy and make financial arrangements for her medications.  BG 141 AM, 142 PM when taking insulin via sample. She is about to run out of the sample pen and requesting one more until her arrangements are in place.   Recommendations/Changes made from today's visit: - Will coordinate sample x1 more box to cover until she obtains her RX through the pharmacy - Plan for optimizing toujeo dosing to achieve BG control, then will facilitate patient to get back on ozempic, titrate ozempic dosing increase as tolerated, and decrease/eventually discontinue insulin altogether.  Plan: f/u with pharmacist in 1 month  Subjective: Sarah Kane is an 51 y.o. year old female who is a primary patient of Samuel Bouche, NP.  The CCM team was consulted for assistance with disease management and care coordination needs.    Engaged with patient by telephone for follow up visit in response to provider referral for pharmacy case management and/or care coordination services.   Consent to Services:  The patient was given information about Chronic Care Management services, agreed to services, and gave verbal consent prior to initiation of services.  Please see initial visit note for detailed documentation.   Patient Care Team: Samuel Bouche, NP as PCP - General (Nurse Practitioner) Fay Records, MD as PCP - Cardiology (Cardiology) Darius Bump, [facility] (Pharmacist)   Objective:  Lab Results  Component Value Date   CREATININE 0.83 04/19/2022   CREATININE 1.23 (H) 01/18/2022   CREATININE 0.83 01/02/2022    Lab Results  Component Value Date   HGBA1C  10.1 (A) 04/24/2022       Component Value Date/Time   CHOL 154 01/02/2022 0000   CHOL 135 06/18/2020 1226   TRIG 236 (H) 01/02/2022 0000   HDL 56 01/02/2022 0000   HDL 49 06/18/2020 1226   CHOLHDL 2.8 01/02/2022 0000   VLDL 30 05/21/2020 0438   LDLCALC 69 01/02/2022 0000       Latest Ref Rng & Units 01/02/2022   12:00 AM 03/10/2021   11:16 AM 12/13/2020    7:36 AM  Hepatic Function  Total Protein 6.1 - 8.1 g/dL 7.0  7.7  6.4   Albumin 3.5 - 5.0 g/dL  4.0    AST 10 - 35 U/L _0 ALT 6 - 29 U/L _1 Alk Phosphatase 38 - 126 U/L  77    Total Bilirubin 0.2 - 1.2 mg/dL 0.4  0.3  0.4     Lab Results  Component Value Date/Time   TSH 1.494 05/20/2020 09:06 PM       Latest Ref Rng & Units 04/19/2022   12:11 PM 04/19/2022   12:00 PM 01/18/2022   12:32 AM  CBC  WBC 4.0 - 10.5 K/uL  10.8  11.1   Hemoglobin 12.0 - 15.0 g/dL 15.3  15.3  15.8   Hematocrit 36.0 - 46.0 % 45.0  44.9  46.7   Platelets 150 - 400 K/uL  217  214     Lab Results  Component Value Date/Time   VD25OH 72.19 04/02/2018 12:00 AM  Clinical ASCVD: Yes  The ASCVD Risk score (Arnett DK, et al., 2019) failed to calculate for the following reasons:   The patient has a prior MI or stroke diagnosis    Other: (CHADS2VASc if Afib, PHQ9 if depression, MMRC or CAT for COPD, ACT, DEXA)  Social History   Tobacco Use  Smoking Status Never  Smokeless Tobacco Never   BP Readings from Last 3 Encounters:  04/24/22 116/75  04/19/22 129/88  04/18/22 (!) 146/101   Pulse Readings from Last 3 Encounters:  04/24/22 90  04/19/22 82  04/18/22 85   Wt Readings from Last 3 Encounters:  04/24/22 194 lb 9.6 oz (88.3 kg)  04/19/22 185 lb (83.9 kg)  04/18/22 185 lb (83.9 kg)    Assessment: Review of patient past medical history, allergies, medications, health status, including review of consultants reports, laboratory and other test data, was performed as part of comprehensive evaluation and provision of  chronic care management services.   SDOH:  (Social Determinants of Health) assessments and interventions performed:  SDOH Interventions    Flowsheet Row Office Visit from 12/13/2020 in [place] Office Visit from 11/25/2020 in [place] Video Visit from 11/06/2019 in [place] Office Visit from 04/22/2019 in [place]  SDOH Interventions      Depression Interventions/Treatment  Medication, Counseling Medication, Counseling Medication, Counseling Patient refuses Treatment       CCM Care Plan  Allergies  Allergen Reactions   Nitroglycerin     Blood pressure dropped, was hospitalized     Medications Reviewed Today     Reviewed by Darius Bump, [facility] (Pharmacist) on 05/10/22 at [phone]  Med List Status: <None>   Medication Order Taking? Sig Documenting Provider Last Dose Status Informant  acetaminophen (TYLENOL) 500 MG tablet [id number] Yes Take 1,000 mg by mouth every 6 (six) hours as needed for mild pain or headache.  [provider] Taking Active Self  aspirin 81 MG EC tablet [id number] Yes Take 1 tablet (81 mg total) by mouth daily. Swallow whole. Fay Records, MD Taking Active Self  atorvastatin (LIPITOR) 80 MG tablet [id number] Yes Take 1 tablet (80 mg total) by mouth daily. Fay Records, MD Taking Active   gabapentin (NEURONTIN) 300 MG capsule [id number] Yes Take 1 capsule (300 mg total) by mouth 3 (three) times daily. Samuel Bouche, NP Taking Active   glimepiride (AMARYL) 4 MG tablet [id number] Yes TAKE 2 TABLETS BY MOUTH ONCE DAILY WITH BREAKFAST. Samuel Bouche, NP Taking Active   insulin glargine, 2 Unit Dial, (TOUJEO MAX SOLOSTAR) 300 UNIT/ML Solostar Pen [id number] Yes Inject 10 Units into the skin daily. Samuel Bouche, NP Taking Active   Insulin Pen Needle 31G X 5 MM MISC [id number] Yes Use one needle with Toujeo insulin pen to inject  insulin once daily. Samuel Bouche, NP Taking Active   isosorbide mononitrate (IMDUR) 30 MG 24 hr tablet [id number] Yes TAKE 1 & 1/2 (ONE & ONE-HALF) TABLETS BY MOUTH ONCE DAILY Fay Records, MD Taking Active   metoprolol succinate (TOPROL-XL) 25 MG 24 hr tablet [id number] Yes Take 1 tablet (25 mg total) by mouth 2 (two) times daily with a meal. Fay Records, MD Taking Active   prazosin (MINIPRESS) 2 MG capsule [id number] Yes Take 1 capsule (2 mg total) by mouth at bedtime. Samuel Bouche, NP Taking Active   sitaGLIPtin (JANUVIA) 100 MG tablet [id number]  Yes Take 1 tablet (100 mg total) by mouth daily. Samuel Bouche, NP Taking Active   ticagrelor (BRILINTA) 90 MG TABS tablet [id number] Yes Take 1 tablet (90 mg total) by mouth 2 (two) times daily. Fay Records, MD Taking Active   Patient not taking:  Discontinued 05/10/22 0949 (Patient Preference)             Patient Active Problem List   Diagnosis Date Noted   Bilateral shoulder pain 09/08/2020   Unstable angina [facility])    Hospital discharge follow-up 06/10/2020   Anxiety with depression 06/10/2020   Dehydration 06/10/2020   Hyperlipidemia LDL goal <70 05/22/2020   CAD (coronary artery disease) 05/22/2020   Ischemic cardiomyopathy 05/22/2020   NSTEMI (non-ST elevated myocardial infarction) ([place]) 05/20/2020   Type 2 diabetes mellitus with hyperglycemia, without long-term current use of insulin ([place]) 10/23/2019   Pain of skin 08/23/2019   Essential hypertension 10/29/2018   Tear of left supraspinatus tendon 08/14/2018   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   Dupuytren's contracture of both hands 07/26/2018   Chronic pain 06/28/2018   ANA positive 06/28/2018   Arthritis of left acromioclavicular joint 10/03/2017   Subacromial bursitis 10/03/2017   Carpal tunnel syndrome 03/06/2017   Right hand pain 10/02/2016   Complex regional pain syndrome 06/16/2016   Trigger finger of right thumb 03/03/2016   Joint synovitis 02/07/2016     Immunization History  Administered Date(s) Administered   PFIZER(Purple Top)SARS-COV-2 Vaccination 01/06/2020, 01/27/2020   Pneumococcal Polysaccharide-23 10/02/2016    Conditions to be addressed/monitored: HTN, HLD, and DMII  Care Plan : Medication Management  Updates made by Darius Bump, [place] since 05/30/2022 12:00 AM     Problem: DM, HTN, HLD      Long-Range Goal: Disease Progression Prevention   Start Date: 05/10/2022  Recent Progress: On track  Priority: High  Note:   Current Barriers:  Unable to independently afford treatment regimen Unable to independently monitor therapeutic efficacy Unable to achieve control of diabetes  Suboptimal therapeutic regimen for diabetes  Pharmacist Clinical Goal(s):  Over the next 30 days, patient will achieve adherence to monitoring guidelines and medication adherence to achieve therapeutic efficacy achieve control of diabetes as evidenced by tracking blood glucose, adjusting insulin dosage, and future a1c through collaboration with PharmD and provider.   Interventions: 1:1 collaboration with Samuel Bouche, NP regarding development and update of comprehensive plan of care as evidenced by provider attestation and co-signature Inter-disciplinary care team collaboration (see longitudinal plan of care) Comprehensive medication review performed; medication list updated in electronic medical record  Diabetes:  Uncontrolled; current treatment:glimepiride 51m daily, januvia 1037mdaily;   Current glucose readings: not consistently checking, was 246 about 2hr postprandial during our call  Denies hypoglycemic/hyperglycemic symptoms  Current meal patterns: to be discussed at future visits   Current exercise:to be discussed at future visits  Counseled on s/sx of high and low blood sugar, glycemic goals, Recommended patient check blood glucose AT LEAST 2 times per week on empty stomach and 1-2hr after food, document, and provide for next  discussion ,  Hypertension:  Controlled; current treatment:metoprolol XL 2555mID, imdur 43m67ming 1.5 tablets daily (regimen by cardiology);   Current home readings: to be discussed at future visits  Denies hypotensive/hypertensive symptoms  Recommended continue current regimen Hyperlipidemia:  Controlled; current treatment:atorvastatin 80mg73mly; LDL 69, TG 236  Recommended continue current regimen   Patient Goals/Self-Care Activities Over the next 30 days, patient will:  take medications as prescribed,  check glucose 2x per week, document, and provide at future appointments, and collaborate with provider on medication access solutions  Follow Up Plan: Telephone follow up appointment with care management team member scheduled for:  1 month      Medication Assistance:  TBD  Patient's preferred pharmacy is:  [facility], [place] [address] Phone: [phone] Fax: [place] [address] [place] [address] Phone: [phone] Fax: [id number] - [place], [place] - [address] AT [place] [address] [place] [address] Phone: [phone] Fax: [phone]  [facility] [address] [place] [address] Phone: [phone] Fax: [phone]  [place], [place] mail services [place] [address] Phone: [phone] Fax: [phone]   Follow Up:  Patient agrees to Care Plan and Follow-up.  Plan: Telephone follow up appointment with care management team member scheduled for:  1 month  Larinda Buttery, PharmD Clinical Pharmacist [facility] At [facility] [phone]

## 2022-05-30 NOTE — Patient Instructions (Signed)
Visit Information  Thank you for taking time to visit with me today. Please don't hesitate to contact me if I can be of assistance to you before our next scheduled telephone appointment.  Following are the goals we discussed today:   Patient Goals/Self-Care Activities Over the next 30 days, patient will:  take medications as prescribed, check glucose 2x per week, document, and provide at future appointments, and collaborate with provider on medication access solutions  Follow Up Plan: Telephone follow up appointment with care management team member scheduled for:  1 month  Please call the care guide team at [phone] if you need to cancel or reschedule your appointment.   If you are experiencing a Mental Health or Behavioral Health Crisis or need someone to talk to, please call the Suicide and Crisis Lifeline: 988 call 1-800-273-TALK (toll free, 24 hour hotline)   Patient verbalizes understanding of instructions and care plan provided today and agrees to view in MyChart. Active MyChart status and patient understanding of how to access instructions and care plan via MyChart confirmed with patient.     Gabriel Carina

## 2022-05-31 ENCOUNTER — Encounter: Payer: Self-pay | Admitting: Pharmacist

## 2022-06-13 ENCOUNTER — Other Ambulatory Visit: Payer: Self-pay | Admitting: Internal Medicine

## 2022-06-13 NOTE — Telephone Encounter (Signed)
Rx refill sent to pharmacy. 

## 2022-06-19 DIAGNOSIS — S99922A Unspecified injury of left foot, initial encounter: Secondary | ICD-10-CM | POA: Diagnosis not present

## 2022-06-27 ENCOUNTER — Telehealth: Payer: BC Managed Care – PPO

## 2022-06-28 ENCOUNTER — Encounter: Payer: Self-pay | Admitting: Medical-Surgical

## 2022-06-28 DIAGNOSIS — E1165 Type 2 diabetes mellitus with hyperglycemia: Secondary | ICD-10-CM

## 2022-06-28 MED ORDER — GLIMEPIRIDE 4 MG PO TABS: ORAL_TABLET | ORAL | 1 refills | Status: DC

## 2022-06-29 ENCOUNTER — Ambulatory Visit: Payer: BC Managed Care – PPO | Admitting: Pharmacist

## 2022-06-29 DIAGNOSIS — I10 Essential (primary) hypertension: Secondary | ICD-10-CM

## 2022-06-29 DIAGNOSIS — E785 Hyperlipidemia, unspecified: Secondary | ICD-10-CM

## 2022-06-29 DIAGNOSIS — E1165 Type 2 diabetes mellitus with hyperglycemia: Secondary | ICD-10-CM

## 2022-06-29 NOTE — Patient Instructions (Signed)
Visit Information  Thank you for taking time to visit with me today. Please don't hesitate to contact me if I can be of assistance to you before our next scheduled telephone appointment.  Following are the goals we discussed today:   Patient Goals/Self-Care Activities Over the next 30 days, patient will:  take medications as prescribed, check glucose 2x per week, document, and provide at future appointments, and collaborate with provider on medication access solutions  Follow Up Plan: Telephone follow up appointment with care management team member scheduled for:  1 month  Please call the care guide team at [phone] if you need to cancel or reschedule your appointment.    Patient verbalizes understanding of instructions and care plan provided today and agrees to view in [facility]. Active MyChart status and patient understanding of how to access instructions and care plan via MyChart confirmed with patient.     Darius Bump

## 2022-06-29 NOTE — Progress Notes (Signed)
Chronic Care Management Pharmacy Note  06/29/2022 Name:  Sarah Kane MRN:  [id number] DOB:  08/07/1971  Summary: addressed HTN, HLD, and primarily DM + medication access.   Patient has not checked sugars lately, but has made all coordination efforts with obtaining her medications through [place] mail order & "my agile life" program.  She also has improved her daily routine to include making sure she goes home after work before going to her daughter's house, so that she remembers to take her medication doses!   Recommendations/Changes made from today's visit: - Continue current medications and routine - Goal: add back checking blood sugars AT LEAST once weekly but ideally once daily. - Plan for optimizing toujeo dosing to achieve BG control, then will facilitate patient to get back on ozempic, titrate ozempic dosing increase as tolerated, and decrease/eventually discontinue insulin altogether.  Plan: f/u with pharmacist in 1 month  Subjective: Sarah Kane is an 51 y.o. year old female who is a primary patient of Samuel Bouche, NP.  The CCM team was consulted for assistance with disease management and care coordination needs.    Engaged with patient by telephone for follow up visit in response to provider referral for pharmacy case management and/or care coordination services.   Consent to Services:  The patient was given information about Chronic Care Management services, agreed to services, and gave verbal consent prior to initiation of services.  Please see initial visit note for detailed documentation.   Patient Care Team: Samuel Bouche, NP as PCP - General (Nurse Practitioner) Fay Records, MD as PCP - Cardiology (Cardiology) Darius Bump, [facility] (Pharmacist)   Objective:  Lab Results  Component Value Date   CREATININE 0.83 04/19/2022   CREATININE 1.23 (H) 01/18/2022   CREATININE 0.83 01/02/2022    Lab Results  Component Value Date   HGBA1C 10.1 (A) 04/24/2022        Component Value Date/Time   CHOL 154 01/02/2022 0000   CHOL 135 06/18/2020 1226   TRIG 236 (H) 01/02/2022 0000   HDL 56 01/02/2022 0000   HDL 49 06/18/2020 1226   CHOLHDL 2.8 01/02/2022 0000   VLDL 30 05/21/2020 0438   LDLCALC 69 01/02/2022 0000       Latest Ref Rng & Units 01/02/2022   12:00 AM 03/10/2021   11:16 AM 12/13/2020    7:36 AM  Hepatic Function  Total Protein 6.1 - 8.1 g/dL 7.0  7.7  6.4   Albumin 3.5 - 5.0 g/dL  4.0    AST 10 - 35 U/L 17  28  14    ALT 6 - 29 U/L 27  29  23    Alk Phosphatase 38 - 126 U/L  77    Total Bilirubin 0.2 - 1.2 mg/dL 0.4  0.3  0.4     Lab Results  Component Value Date/Time   TSH 1.494 05/20/2020 09:06 PM       Latest Ref Rng & Units 04/19/2022   12:11 PM 04/19/2022   12:00 PM 01/18/2022   12:32 AM  CBC  WBC 4.0 - 10.5 K/uL  10.8  11.1   Hemoglobin 12.0 - 15.0 g/dL 15.3  15.3  15.8   Hematocrit 36.0 - 46.0 % 45.0  44.9  46.7   Platelets 150 - 400 K/uL  217  214     Lab Results  Component Value Date/Time   VD25OH 72.19 04/02/2018 12:00 AM    Clinical ASCVD: Yes  The ASCVD Risk score (Arnett  DK, et al., 2019) failed to calculate for the following reasons:   The patient has a prior MI or stroke diagnosis    Other: (CHADS2VASc if Afib, PHQ9 if depression, MMRC or CAT for COPD, ACT, DEXA)  Social History   Tobacco Use  Smoking Status Never  Smokeless Tobacco Never   BP Readings from Last 3 Encounters:  04/24/22 116/75  04/19/22 129/88  04/18/22 (!) 146/101   Pulse Readings from Last 3 Encounters:  04/24/22 90  04/19/22 82  04/18/22 85   Wt Readings from Last 3 Encounters:  04/24/22 194 lb 9.6 oz (88.3 kg)  04/19/22 185 lb (83.9 kg)  04/18/22 185 lb (83.9 kg)    Assessment: Review of patient past medical history, allergies, medications, health status, including review of consultants reports, laboratory and other test data, was performed as part of comprehensive evaluation and provision of chronic care management  services.   SDOH:  (Social Determinants of Health) assessments and interventions performed:  SDOH Interventions    Flowsheet Row Office Visit from 12/13/2020 in [place] Office Visit from 11/25/2020 in [place] Video Visit from 11/06/2019 in [place] Office Visit from 04/22/2019 in [place]  SDOH Interventions      Depression Interventions/Treatment  Medication, Counseling Medication, Counseling Medication, Counseling Patient refuses Treatment       CCM Care Plan  Allergies  Allergen Reactions   Nitroglycerin     Blood pressure dropped, was hospitalized     Medications Reviewed Today     Reviewed by Darius Bump, [facility] (Pharmacist) on 06/29/22 at [place] List Status: <None>   Medication Order Taking? Sig Documenting Provider Last Dose Status Informant  acetaminophen (TYLENOL) 500 MG tablet [id number] Yes Take 1,000 mg by mouth every 6 (six) hours as needed for mild pain or headache.  [provider] Taking Active Self  aspirin 81 MG EC tablet [id number] Yes Take 1 tablet (81 mg total) by mouth daily. Swallow whole. Fay Records, MD Taking Active Self  atorvastatin (LIPITOR) 80 MG tablet [id number] Yes Take 1 tablet (80 mg total) by mouth daily. Fay Records, MD Taking Active   gabapentin (NEURONTIN) 300 MG capsule [id number] Yes Take 1 capsule (300 mg total) by mouth 3 (three) times daily. Samuel Bouche, NP Taking Active   glimepiride (AMARYL) 4 MG tablet [id number] Yes TAKE 2 TABLETS BY MOUTH ONCE DAILY WITH BREAKFAST. Samuel Bouche, NP Taking Active   insulin glargine, 2 Unit Dial, (TOUJEO MAX SOLOSTAR) 300 UNIT/ML Solostar Pen [id number] Yes Inject 10 Units into the skin daily. Samuel Bouche, NP Taking Active   Insulin Pen Needle 31G X 5 MM MISC [id number] Yes Use one needle with Toujeo insulin pen to inject insulin once daily.  Samuel Bouche, NP Taking Active   isosorbide mononitrate (IMDUR) 30 MG 24 hr tablet [id number] Yes TAKE 1 & 1/2 (ONE & ONE-HALF) TABLETS BY MOUTH ONCE DAILY Fay Records, MD Taking Active   metoprolol succinate (TOPROL-XL) 25 MG 24 hr tablet [id number] Yes TAKE 1 TABLET BY MOUTH TWICE DAILY WITH A MEAL Fay Records, MD Taking Active   prazosin (MINIPRESS) 2 MG capsule [id number] Yes Take 1 capsule (2 mg total) by mouth at bedtime. Samuel Bouche, NP Taking Active   sitaGLIPtin (JANUVIA) 100 MG tablet [id number] Yes Take 1 tablet (100 mg total) by mouth daily. Samuel Bouche, NP Taking  Active   ticagrelor (BRILINTA) 90 MG TABS tablet [id number] Yes Take 1 tablet (90 mg total) by mouth 2 (two) times daily. Fay Records, MD Taking Active             Patient Active Problem List   Diagnosis Date Noted   Bilateral shoulder pain 09/08/2020   Unstable angina [facility])    Hospital discharge follow-up 06/10/2020   Anxiety with depression 06/10/2020   Dehydration 06/10/2020   Hyperlipidemia LDL goal <70 05/22/2020   CAD (coronary artery disease) 05/22/2020   Ischemic cardiomyopathy 05/22/2020   NSTEMI (non-ST elevated myocardial infarction) ([place]) 05/20/2020   Type 2 diabetes mellitus with hyperglycemia, without long-term current use of insulin ([place]) 10/23/2019   Pain of skin 08/23/2019   Essential hypertension 10/29/2018   Tear of left supraspinatus tendon 08/14/2018   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   Dupuytren's contracture of both hands 07/26/2018   Chronic pain 06/28/2018   ANA positive 06/28/2018   Arthritis of left acromioclavicular joint 10/03/2017   Subacromial bursitis 10/03/2017   Carpal tunnel syndrome 03/06/2017   Right hand pain 10/02/2016   Complex regional pain syndrome 06/16/2016   Trigger finger of right thumb 03/03/2016   Joint synovitis 02/07/2016    Immunization History  Administered Date(s) Administered   PFIZER(Purple Top)SARS-COV-2 Vaccination 01/06/2020,  01/27/2020   Pneumococcal Polysaccharide-23 10/02/2016    Conditions to be addressed/monitored: HTN, HLD, and DMII  There are no care plans that you recently modified to display for this patient.     Medication Assistance:  TBD  Patient's preferred pharmacy is:  [facility], [place] [address] Phone: [phone] Fax: [place] [address] [place] [address] Phone: [phone] Fax: [id number] - [place], [place] - [address] AT [place] [address] [place] [address] Phone: [phone] Fax: [phone]  [facility] [address] [place] [address] Phone: [phone] Fax: [phone]  [place], [place] mail [place] [address] Phone: [phone] Fax: [phone]   Follow Up:  Patient agrees to Care Plan and Follow-up.  Plan: Telephone follow up appointment with care management team member scheduled for:  1 month  Larinda Buttery, PharmD Clinical Pharmacist [facility] [phone]

## 2022-06-29 NOTE — Addendum Note (Signed)
Addended bySamuel Bouche on: 06/29/2022 07:01 PM   Modules accepted: Orders

## 2022-07-11 ENCOUNTER — Emergency Department (HOSPITAL_BASED_OUTPATIENT_CLINIC_OR_DEPARTMENT_OTHER)
Admission: EM | Admit: 2022-07-11 | Discharge: 2022-07-11 | Disposition: A | Discharge status: Home / Self Care | Payer: BC Managed Care – PPO | Attending: Emergency Medicine | Admitting: Emergency Medicine

## 2022-07-11 ENCOUNTER — Other Ambulatory Visit: Payer: Self-pay

## 2022-07-11 ENCOUNTER — Other Ambulatory Visit (HOSPITAL_BASED_OUTPATIENT_CLINIC_OR_DEPARTMENT_OTHER): Payer: Self-pay

## 2022-07-11 ENCOUNTER — Encounter (HOSPITAL_BASED_OUTPATIENT_CLINIC_OR_DEPARTMENT_OTHER): Payer: Self-pay | Admitting: Emergency Medicine

## 2022-07-11 DIAGNOSIS — Z7982 Long term (current) use of aspirin: Secondary | ICD-10-CM | POA: Diagnosis not present

## 2022-07-11 DIAGNOSIS — H1032 Unspecified acute conjunctivitis, left eye: Secondary | ICD-10-CM | POA: Diagnosis not present

## 2022-07-11 DIAGNOSIS — H5712 Ocular pain, left eye: Secondary | ICD-10-CM | POA: Diagnosis not present

## 2022-07-11 DIAGNOSIS — Z7984 Long term (current) use of oral hypoglycemic drugs: Secondary | ICD-10-CM | POA: Insufficient documentation

## 2022-07-11 DIAGNOSIS — L03213 Periorbital cellulitis: Secondary | ICD-10-CM | POA: Insufficient documentation

## 2022-07-11 DIAGNOSIS — Z79899 Other long term (current) drug therapy: Secondary | ICD-10-CM | POA: Insufficient documentation

## 2022-07-11 DIAGNOSIS — E119 Type 2 diabetes mellitus without complications: Secondary | ICD-10-CM | POA: Diagnosis not present

## 2022-07-11 DIAGNOSIS — Z794 Long term (current) use of insulin: Secondary | ICD-10-CM | POA: Insufficient documentation

## 2022-07-11 DIAGNOSIS — I10 Essential (primary) hypertension: Secondary | ICD-10-CM | POA: Diagnosis not present

## 2022-07-11 DIAGNOSIS — Z1152 Encounter for screening for COVID-19: Secondary | ICD-10-CM | POA: Diagnosis not present

## 2022-07-11 DIAGNOSIS — I2510 Atherosclerotic heart disease of native coronary artery without angina pectoris: Secondary | ICD-10-CM | POA: Diagnosis not present

## 2022-07-11 LAB — RESP PANEL BY RT-PCR (FLU A&B, COVID) ARPGX2
Influenza A by PCR: NEGATIVE
Influenza B by PCR: NEGATIVE
SARS Coronavirus 2 by RT PCR: NEGATIVE

## 2022-07-11 MED ORDER — AMOXICILLIN-POT CLAVULANATE 875-125 MG PO TABS
1.0000 | ORAL_TABLET | Freq: Two times a day (BID) | ORAL | 0 refills | Status: DC
  Filled 2022-07-11: qty 14, 7d supply, fill #0

## 2022-07-11 MED ORDER — ERYTHROMYCIN 5 MG/GM OP OINT
TOPICAL_OINTMENT | OPHTHALMIC | 0 refills | Status: DC
  Filled 2022-07-11: qty 3.5, 7d supply, fill #0

## 2022-07-11 MED ORDER — CLINDAMYCIN HCL 300 MG PO CAPS
300.0000 mg | ORAL_CAPSULE | Freq: Three times a day (TID) | ORAL | 0 refills | Status: AC
  Filled 2022-07-11: qty 21, 7d supply, fill #0

## 2022-07-11 NOTE — ED Provider Notes (Signed)
[facility] EMERGENCY DEPARTMENT Provider Note   CSN: [id number] Arrival date & time: 07/11/22  0940     History  Chief Complaint  Patient presents with   Eye Pain    left    Sarah Kane is a 51 y.o. female.  The history is provided by the patient and medical records. No language interpreter was used.  Eye Pain This is a new problem. The current episode started more than 2 days ago. The problem occurs constantly. The problem has been gradually worsening. Pertinent negatives include no chest pain, no abdominal pain, no headaches and no shortness of breath. Nothing aggravates the symptoms. Nothing relieves the symptoms. She has tried nothing for the symptoms. The treatment provided no relief.       Home Medications Prior to Admission medications   Medication Sig Start Date End Date Taking? Authorizing Provider  acetaminophen (TYLENOL) 500 MG tablet Take 1,000 mg by mouth every 6 (six) hours as needed for mild pain or headache.     [provider]  aspirin 81 MG EC tablet Take 1 tablet (81 mg total) by mouth daily. Swallow whole. 06/04/20   Pricilla Riffle, MD  atorvastatin (LIPITOR) 80 MG tablet Take 1 tablet (80 mg total) by mouth daily. 07/26/21   Pricilla Riffle, MD  gabapentin (NEURONTIN) 300 MG capsule Take 1 capsule (300 mg total) by mouth 3 (three) times daily. 01/02/22   Christen Butter, NP  glimepiride (AMARYL) 4 MG tablet TAKE 2 TABLETS BY MOUTH ONCE DAILY WITH BREAKFAST. 06/28/22   Christen Butter, NP  insulin glargine, 2 Unit Dial, (TOUJEO MAX SOLOSTAR) 300 UNIT/ML Solostar Pen Inject 10 Units into the skin daily. 04/24/22   Christen Butter, NP  Insulin Pen Needle 31G X 5 MM MISC Use one needle with Toujeo insulin pen to inject insulin once daily. 04/24/22   Christen Butter, NP  isosorbide mononitrate (IMDUR) 30 MG 24 hr tablet TAKE 1 & 1/2 (ONE & ONE-HALF) TABLETS BY MOUTH ONCE DAILY 07/26/21   Pricilla Riffle, MD  metoprolol succinate (TOPROL-XL) 25 MG 24 hr tablet TAKE 1  TABLET BY MOUTH TWICE DAILY WITH A MEAL 06/13/22   Pricilla Riffle, MD  prazosin (MINIPRESS) 2 MG capsule Take 1 capsule (2 mg total) by mouth at bedtime. 01/02/22   Christen Butter, NP  sitaGLIPtin (JANUVIA) 100 MG tablet Take 1 tablet (100 mg total) by mouth daily. 01/02/22   Christen Butter, NP  ticagrelor (BRILINTA) 90 MG TABS tablet Take 1 tablet (90 mg total) by mouth 2 (two) times daily. 07/26/21   Pricilla Riffle, MD      Allergies    Nitroglycerin    Review of Systems   Review of Systems  Constitutional:  Negative for chills, diaphoresis, fatigue and fever.  HENT:  Positive for congestion and facial swelling. Negative for ear discharge and ear pain.   Eyes:  Positive for pain and discharge. Negative for redness, itching and visual disturbance.  Respiratory:  Negative for cough, chest tightness and shortness of breath.   Cardiovascular:  Negative for chest pain.  Gastrointestinal:  Negative for abdominal pain, constipation, diarrhea, nausea and vomiting.  Genitourinary:  Negative for dysuria and flank pain.  Musculoskeletal:  Negative for back pain, neck pain and neck stiffness.  Skin:  Negative for rash and wound.  Neurological:  Positive for facial asymmetry (swellingof left face). Negative for dizziness, light-headedness and headaches.  Psychiatric/Behavioral:  Negative for agitation and confusion.   All other  systems reviewed and are negative.   Physical Exam Updated Vital Signs BP (!) 141/98   Pulse 84   Temp 98.5 F (36.9 C) (Oral)   Resp 18   Ht 5' (1.524 m)   Wt 86.2 kg   SpO2 97%   BMI 37.11 kg/m  Physical Exam Vitals and nursing note reviewed.  Constitutional:      General: She is not in acute distress.    Appearance: She is well-developed. She is not ill-appearing, toxic-appearing or diaphoretic.  HENT:     Head: Atraumatic.      Nose: Congestion present. No rhinorrhea.     Mouth/Throat:     Mouth: Mucous membranes are moist.     Pharynx: No oropharyngeal exudate or  posterior oropharyngeal erythema.  Eyes:     General:        Left eye: Discharge present.    Pupils: Pupils are equal, round, and reactive to light.  Cardiovascular:     Rate and Rhythm: Normal rate and regular rhythm.     Heart sounds: No murmur heard. Pulmonary:     Effort: Pulmonary effort is normal. No respiratory distress.     Breath sounds: Normal breath sounds. No wheezing, rhonchi or rales.  Chest:     Chest wall: No tenderness.  Abdominal:     General: Abdomen is flat.     Palpations: Abdomen is soft.     Tenderness: There is no abdominal tenderness. There is no right CVA tenderness, left CVA tenderness, guarding or rebound.  Musculoskeletal:        General: No swelling or tenderness.     Cervical back: Neck supple. No tenderness.     Right lower leg: No edema.     Left lower leg: No edema.  Skin:    General: Skin is warm and dry.     Capillary Refill: Capillary refill takes less than 2 seconds.     Findings: Erythema present. No rash.  Neurological:     General: No focal deficit present.     Mental Status: She is alert.  Psychiatric:        Mood and Affect: Mood normal.     ED Results / Procedures / Treatments   Labs (all labs ordered are listed, but only abnormal results are displayed) Labs Reviewed  RESP PANEL BY RT-PCR (FLU A&B, COVID) [id number]    EKG None  Radiology No results found.  Procedures Procedures    Medications Ordered in ED Medications - No data to display  ED Course/ Medical Decision Making/ A&P                           Medical Decision Making Risk Prescription drug management.    Sarah Kane is a 51 y.o. female with a past medical history significant for diabetes, hypertension, hyperlipidemia, and CAD who presents with left facial redness, swelling, pain, and eye discharge.  According to patient over the last few days she has had pain around her left eye but did not have pain in the eyeball itself.  She reports no vision  changes and reports she is had some diabetic retinopathy's in the left eye in the past requiring injections but has never had glaucoma.  She denies any trauma.  She denies significant headache neck pain or neck stiffness.  Reports no fevers or chills but is had some mild congestion.  No ear pain or hearing changes.  Denies any nausea, vomiting,  constipation, diarrhea, or urinary changes.  Patient reports a scar on her left cheek that is unchanged her baseline but she has redness and swelling and pain surrounding the orbit.  No history of orbital cellulitis to her knowledge.  On exam, lungs clear and chest nontender.  Abdomen nontender.  No stridor.  Neck nontender.  No tenderness of the mastoid and ear exam was unremarkable.  Patient did have some puffiness, erythema and redness around the left orbit and she had some discharge in the left eye.  The eye itself was not critically red or injected and the pupil was reactive with normal extract movements.  She reports her vision was normal and did not have eye pain itself.  No pain with eye movement.  No photophobia or consensual photophobia.  Nasal exam unremarkable.  No other tenderness of the face.  Clinically I suspect she has a periorbital cellulitis.  We discussed getting labs and CT imaging to look for deeper infection but given the lack of severe headache or other systemic symptoms, we agreed to hold on imaging and labs at this time.  She did have a COVID and flu test that were negative in triage.  Patient is amenable to getting antibiotics.  We will give clinda and Augmentin for the periorbital cellulitis and will also give some Romycin for some of the gunky discharge in the left eye.  We also offered fluorescein exam and tetracaine but given the lack of eye pain she would rather do the antibiotics and follow-up with the PCP and return if symptoms change or worsen.  She agreed with plan of care and given lack of vision changes we agree with  plan.  Patient discharged in good condition for outpatient management.        Final Clinical Impression(s) / ED Diagnoses Final diagnoses:  Periorbital cellulitis of left eye  Acute conjunctivitis of left eye, unspecified acute conjunctivitis type    Rx / DC Orders ED Discharge Orders          Ordered    erythromycin ophthalmic ointment        07/11/22 1108    clindamycin (CLEOCIN) 300 MG capsule  3 times daily        07/11/22 1108    amoxicillin-clavulanate (AUGMENTIN) 875-125 MG tablet  Every 12 hours        07/11/22 1108            Clinical Impression: 1. Periorbital cellulitis of left eye   2. Acute conjunctivitis of left eye, unspecified acute conjunctivitis type     Disposition: Discharge  Condition: Good  I have discussed the results, Dx and Tx plan with the pt(& family if present). He/she/they expressed understanding and agree(s) with the plan. Discharge instructions discussed at great length. Strict return precautions discussed and pt &/or family have verbalized understanding of the instructions. No further questions at time of discharge.    Discharge Medication List as of 07/11/2022 11:09 AM     START taking these medications   Details  amoxicillin-clavulanate (AUGMENTIN) 875-125 MG tablet Take 1 tablet by mouth every 12 (twelve) hours., Starting Tue 07/11/2022, Print    clindamycin (CLEOCIN) 300 MG capsule Take 1 capsule (300 mg total) by mouth 3 (three) times daily for 7 days., Starting Tue 07/11/2022, Until Tue 07/18/2022, Print    erythromycin ophthalmic ointment Place a 1/2 inch ribbon of ointment into the lower eyelid 4-6 times a day for 7 days, Print        Follow  Up: Samuel Bouche, [address] [address] [phone]     [facility] [place] [facility] [address] [id number] [address] [facility] [place] [phone]       Zaxton Angerer, Gwenyth Allegra, MD 07/11/22  1445

## 2022-07-11 NOTE — ED Notes (Signed)
AVS provided to client and reviewed information from ED MD. Also discussed and pt teaching provided regards to the three (3) Rx's written by the ED MD as well. Pt teaching done on the proper way to administer the eye ointment. Opportunity for questions provided prior to DC from ED to home

## 2022-07-11 NOTE — ED Triage Notes (Signed)
Left eye pain and swelling x 2 days , obvious drainage , swelling around the ye as well. Nasal congestion. Denied cough or sore throat .

## 2022-07-11 NOTE — Discharge Instructions (Signed)
Your history, exam, evaluation are concerning for a preorbital cellulitis or called preseptal cellulitis with is a skin infection around the eye.  Due to the discharge on the eye itself we will also give few drops for some conjunctivitis.  Please take the drops in the 2 different antibiotics to treat the infection for the next week.  If any symptoms change or worsen acutely, please return to the nearest emergency department.  Please follow-up with your primary doctor as well.  Please also call to follow-up with your ophthalmologist.

## 2022-07-25 NOTE — Progress Notes (Unsigned)
   Established Patient Office Visit  Subjective   Patient ID: Sarah Kane, female   DOB: Jul 03, 1971 Age: 51 y.o. MRN: [id number]   No chief complaint on file.   HPI    Objective:    There were no vitals filed for this visit.  Physical Exam   No results found for this or any previous visit (from the past 24 hour(s)).   {Labs (Optional):23779}  The ASCVD Risk score (Arnett DK, et al., 2019) failed to calculate for the following reasons:   The patient has a prior MI or stroke diagnosis   Assessment & Plan:   No problem-specific Assessment & Plan notes found for this encounter.   No follow-ups on file.  ___________________________________________ [name], DNP, APRN, FNP-BC Primary Care and Sports Medicine [facility]

## 2022-07-26 ENCOUNTER — Ambulatory Visit (INDEPENDENT_AMBULATORY_CARE_PROVIDER_SITE_OTHER): Payer: BC Managed Care – PPO | Admitting: Medical-Surgical

## 2022-07-26 DIAGNOSIS — I10 Essential (primary) hypertension: Secondary | ICD-10-CM

## 2022-07-26 DIAGNOSIS — E1165 Type 2 diabetes mellitus with hyperglycemia: Secondary | ICD-10-CM

## 2022-07-26 DIAGNOSIS — Z91199 Patient's noncompliance with other medical treatment and regimen due to unspecified reason: Secondary | ICD-10-CM

## 2022-07-29 ENCOUNTER — Other Ambulatory Visit: Payer: Self-pay | Admitting: Cardiology

## 2022-07-29 ENCOUNTER — Other Ambulatory Visit: Payer: Self-pay | Admitting: Internal Medicine

## 2022-08-14 NOTE — Progress Notes (Unsigned)
   Established Patient Office Visit  Subjective   Patient ID: Sarah Kane, female   DOB: [birth year] Age: 51 y.o. MRN: [id number]   No chief complaint on file.   HPI Pleasant 51 year old female presenting today for the following:  Diabetes: Type: *** Medications: *** Compliant: *** Side effects: *** Checking sugars: ***, readings *** Diabetic diet: *** Complications: *** Eye exam: *** Foot exam: *** Microalbumin screening: *** Statin: *** ACE/ARB: *** Last A1c: ***  Hypertension: Medication: *** Compliant: *** Side effects: *** Checking BP at home: ***, readings *** Low sodium diet: *** Exercise: *** Concerning symptoms: ***    Objective:    There were no vitals filed for this visit.  Physical Exam   No results found for this or any previous visit (from the past 24 hour(s)).   {Labs (Optional):23779}  The ASCVD Risk score (Arnett DK, et al., 2019) failed to calculate for the following reasons:   The patient has a prior MI or stroke diagnosis   Assessment & Plan:   No problem-specific Assessment & Plan notes found for this encounter.   No follow-ups on file.  ___________________________________________ [name], DNP, APRN, FNP-BC Primary Care and Sports Medicine [facility]

## 2022-08-15 ENCOUNTER — Ambulatory Visit (INDEPENDENT_AMBULATORY_CARE_PROVIDER_SITE_OTHER): Payer: BC Managed Care – PPO | Admitting: Medical-Surgical

## 2022-08-15 VITALS — BP 116/77 | HR 71 | Ht 60.0 in | Wt 195.4 lb

## 2022-08-15 DIAGNOSIS — F418 Other specified anxiety disorders: Secondary | ICD-10-CM

## 2022-08-15 DIAGNOSIS — E1165 Type 2 diabetes mellitus with hyperglycemia: Secondary | ICD-10-CM

## 2022-08-15 DIAGNOSIS — F515 Nightmare disorder: Secondary | ICD-10-CM

## 2022-08-15 DIAGNOSIS — G894 Chronic pain syndrome: Secondary | ICD-10-CM

## 2022-08-15 DIAGNOSIS — I10 Essential (primary) hypertension: Secondary | ICD-10-CM | POA: Diagnosis not present

## 2022-08-15 DIAGNOSIS — E785 Hyperlipidemia, unspecified: Secondary | ICD-10-CM | POA: Diagnosis not present

## 2022-08-15 DIAGNOSIS — F4310 Post-traumatic stress disorder, unspecified: Secondary | ICD-10-CM

## 2022-08-15 LAB — POCT GLYCOSYLATED HEMOGLOBIN (HGB A1C): HbA1c, POC (controlled diabetic range): 10.7 % — AB (ref 0.0–7.0)

## 2022-08-15 MED ORDER — TICAGRELOR 90 MG PO TABS: 90.0000 mg | ORAL_TABLET | Freq: Two times a day (BID) | ORAL | 0 refills | Status: DC

## 2022-08-15 MED ORDER — PRAZOSIN HCL 2 MG PO CAPS
2.0000 mg | ORAL_CAPSULE | Freq: Every day | ORAL | 1 refills | Status: DC
Start: 2022-08-15 — End: 2023-02-06

## 2022-08-15 MED ORDER — INSULIN PEN NEEDLE 31G X 5 MM MISC: 0 refills | Status: DC

## 2022-08-15 MED ORDER — GLIMEPIRIDE 4 MG PO TABS: ORAL_TABLET | ORAL | 1 refills | Status: DC

## 2022-08-15 MED ORDER — METOPROLOL SUCCINATE ER 25 MG PO TB24: 25.0000 mg | ORAL_TABLET | Freq: Two times a day (BID) | ORAL | 2 refills | Status: DC

## 2022-08-15 MED ORDER — TOUJEO MAX SOLOSTAR 300 UNIT/ML ~~LOC~~ SOPN
14.0000 [IU] | PEN_INJECTOR | Freq: Every day | SUBCUTANEOUS | 1 refills | Status: DC
Start: 2022-08-15 — End: 2023-09-20

## 2022-08-15 MED ORDER — SITAGLIPTIN PHOSPHATE 100 MG PO TABS: 100.0000 mg | ORAL_TABLET | Freq: Every day | ORAL | 1 refills | Status: DC

## 2022-08-15 MED ORDER — GABAPENTIN 300 MG PO CAPS
300.0000 mg | ORAL_CAPSULE | Freq: Three times a day (TID) | ORAL | 1 refills | Status: DC
Start: 2022-08-15 — End: 2022-12-18

## 2022-08-15 NOTE — Patient Instructions (Addendum)
Increase Toujeo to 14 units daily. Check sugars every 3 days. If fasting reading is greater than 130, increase the Toujeo dose by 2 units. If sugar is 90 or less, decrease Toujeo by 2 units. Max Toujeo dose 40 units daily.

## 2022-08-22 ENCOUNTER — Ambulatory Visit (INDEPENDENT_AMBULATORY_CARE_PROVIDER_SITE_OTHER): Payer: BC Managed Care – PPO | Admitting: Pharmacist

## 2022-08-22 ENCOUNTER — Encounter: Payer: Self-pay | Admitting: Pharmacist

## 2022-08-22 DIAGNOSIS — E785 Hyperlipidemia, unspecified: Secondary | ICD-10-CM

## 2022-08-22 DIAGNOSIS — E1165 Type 2 diabetes mellitus with hyperglycemia: Secondary | ICD-10-CM

## 2022-08-22 DIAGNOSIS — I10 Essential (primary) hypertension: Secondary | ICD-10-CM

## 2022-08-22 NOTE — Progress Notes (Signed)
Chronic Care Management Pharmacy Note  08/22/2022 Name:  Sarah Kane MRN:  [id number] DOB:  05-Jan-1971  Summary: addressed HTN, HLD, and primarily DM + medication access.   Patient reports fasting BG ~ 190 when waking up, toujeo currently at 18 units, she is increasing dose every few days as per PCP instructions.  Recommendations/Changes made from today's visit: - Continue current medications and routine - Initiate mounjaro 2.5 mg weekly, PCP to send RX. Success will depend upon product availability at pharmacy and copay card for reducing cost. Emailed copay card to patient directly from manufacturer website.  Plan: f/u with pharmacist in 1 month  Subjective: Sarah Kane is an 51 y.o. year old female who is a primary patient of Samuel Bouche, NP.  The CCM team was consulted for assistance with disease management and care coordination needs.    Engaged with patient by telephone for follow up visit in response to provider referral for pharmacy case management and/or care coordination services.   Consent to Services:  The patient was given information about Chronic Care Management services, agreed to services, and gave verbal consent prior to initiation of services.  Please see initial visit note for detailed documentation.   Patient Care Team: Samuel Bouche, NP as PCP - General (Nurse Practitioner) Fay Records, MD as PCP - Cardiology (Cardiology) Darius Bump, [facility] (Pharmacist)   Objective:  Lab Results  Component Value Date   CREATININE 0.83 04/19/2022   CREATININE 1.23 (H) 01/18/2022   CREATININE 0.83 01/02/2022    Lab Results  Component Value Date   HGBA1C 10.7 (A) 08/15/2022       Component Value Date/Time   CHOL 154 01/02/2022 0000   CHOL 135 06/18/2020 1226   TRIG 236 (H) 01/02/2022 0000   HDL 56 01/02/2022 0000   HDL 49 06/18/2020 1226   CHOLHDL 2.8 01/02/2022 0000   VLDL 30 05/21/2020 0438   LDLCALC 69 01/02/2022 0000       Latest Ref Rng & Units  01/02/2022   12:00 AM 03/10/2021   11:16 AM 12/13/2020    7:36 AM  Hepatic Function  Total Protein 6.1 - 8.1 g/dL 7.0  7.7  6.4   Albumin 3.5 - 5.0 g/dL  4.0    AST 10 - 35 U/L _0 ALT 6 - 29 U/L _1 Alk Phosphatase 38 - 126 U/L  77    Total Bilirubin 0.2 - 1.2 mg/dL 0.4  0.3  0.4     Lab Results  Component Value Date/Time   TSH 1.494 05/20/2020 09:06 PM       Latest Ref Rng & Units 04/19/2022   12:11 PM 04/19/2022   12:00 PM 01/18/2022   12:32 AM  CBC  WBC 4.0 - 10.5 K/uL  10.8  11.1   Hemoglobin 12.0 - 15.0 g/dL 15.3  15.3  15.8   Hematocrit 36.0 - 46.0 % 45.0  44.9  46.7   Platelets 150 - 400 K/uL  217  214     Lab Results  Component Value Date/Time   VD25OH 72.19 04/02/2018 12:00 AM    Clinical ASCVD: Yes  The ASCVD Risk score (Arnett DK, et al., 2019) failed to calculate for the following reasons:   The patient has a prior MI or stroke diagnosis    Other: (CHADS2VASc if Afib, PHQ9 if depression, MMRC or CAT for COPD, ACT, DEXA)  Social History   Tobacco  Use  Smoking Status Never  Smokeless Tobacco Never   BP Readings from Last 3 Encounters:  08/15/22 116/77  07/11/22 (!) 141/98  04/24/22 116/75   Pulse Readings from Last 3 Encounters:  08/15/22 71  07/11/22 84  04/24/22 90   Wt Readings from Last 3 Encounters:  08/15/22 195 lb 6.4 oz (88.6 kg)  07/11/22 190 lb (86.2 kg)  04/24/22 194 lb 9.6 oz (88.3 kg)    Assessment: Review of patient past medical history, allergies, medications, health status, including review of consultants reports, laboratory and other test data, was performed as part of comprehensive evaluation and provision of chronic care management services.   SDOH:  (Social Determinants of Health) assessments and interventions performed:  SDOH Interventions    Flowsheet Row Office Visit from 08/15/2022 in [place] Office Visit from 12/13/2020 in [place] Office Visit from 11/25/2020 in [place] Video Visit from 11/06/2019 in [place] Office Visit from 04/22/2019 in [place]  SDOH Interventions       Depression Interventions/Treatment  Medication Medication, Counseling Medication, Counseling Medication, Counseling Patient refuses Treatment       CCM Care Plan  Allergies  Allergen Reactions   Nitroglycerin     Blood pressure dropped, was hospitalized     Medications Reviewed Today     Reviewed by Darius Bump, [facility] (Pharmacist) on 06/29/22 at [place] List Status: <None>   Medication Order Taking? Sig Documenting Provider Last Dose Status Informant  acetaminophen (TYLENOL) 500 MG tablet [id number] Yes Take 1,000 mg by mouth every 6 (six) hours as needed for mild pain or headache.  [provider] Taking Active Self  aspirin 81 MG EC tablet [id number] Yes Take 1 tablet (81 mg total) by mouth daily. Swallow whole. Fay Records, MD Taking Active Self  atorvastatin (LIPITOR) 80 MG tablet [id number] Yes Take 1 tablet (80 mg total) by mouth daily. Fay Records, MD Taking Active   gabapentin (NEURONTIN) 300 MG capsule [id number] Yes Take 1 capsule (300 mg total) by mouth 3 (three) times daily. Samuel Bouche, NP Taking Active   glimepiride (AMARYL) 4 MG tablet [id number] Yes TAKE 2 TABLETS BY MOUTH ONCE DAILY WITH BREAKFAST. Samuel Bouche, NP Taking Active   insulin glargine, 2 Unit Dial, (TOUJEO MAX SOLOSTAR) 300 UNIT/ML Solostar Pen [id number] Yes Inject 10 Units into the skin daily. Samuel Bouche, NP Taking Active   Insulin Pen Needle 31G X 5 MM MISC [id number] Yes Use one needle with Toujeo insulin pen to inject insulin once daily. Samuel Bouche, NP Taking Active   isosorbide mononitrate (IMDUR) 30 MG 24 hr tablet [id number] Yes TAKE 1 & 1/2 (ONE & ONE-HALF) TABLETS BY MOUTH ONCE DAILY Fay Records, MD Taking Active    metoprolol succinate (TOPROL-XL) 25 MG 24 hr tablet [id number] Yes TAKE 1 TABLET BY MOUTH TWICE DAILY WITH A MEAL Fay Records, MD Taking Active   prazosin (MINIPRESS) 2 MG capsule [id number] Yes Take 1 capsule (2 mg total) by mouth at bedtime. Samuel Bouche, NP Taking Active   sitaGLIPtin (JANUVIA) 100 MG tablet [id number] Yes Take 1 tablet (100 mg total) by mouth daily. Samuel Bouche, NP Taking Active   ticagrelor (BRILINTA) 90 MG TABS tablet [id number] Yes Take 1 tablet (90 mg total) by mouth 2 (two) times daily. Fay Records, MD Taking Active  Patient Active Problem List   Diagnosis Date Noted   Bilateral shoulder pain 09/08/2020   Unstable angina (HCC)    Anxiety with depression 06/10/2020   Dehydration 06/10/2020   Hyperlipidemia LDL goal <70 05/22/2020   CAD (coronary artery disease) 05/22/2020   Ischemic cardiomyopathy 05/22/2020   NSTEMI (non-ST elevated myocardial infarction) ([place]) 05/20/2020   Type 2 diabetes mellitus with hyperglycemia, without long-term current use of insulin ([place]) 10/23/2019   Pain of skin 08/23/2019   Essential hypertension 10/29/2018   Tear of left supraspinatus tendon 08/14/2018   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   Dupuytren's contracture of both hands 07/26/2018   Chronic pain 06/28/2018   ANA positive 06/28/2018   Arthritis of left acromioclavicular joint 10/03/2017   Subacromial bursitis 10/03/2017   Carpal tunnel syndrome 03/06/2017   Right hand pain 10/02/2016   Complex regional pain syndrome 06/16/2016   Trigger finger of right thumb 03/03/2016   Joint synovitis 02/07/2016    Immunization History  Administered Date(s) Administered   PFIZER(Purple Top)SARS-COV-2 Vaccination 01/06/2020, 01/27/2020   Pneumococcal Polysaccharide-23 10/02/2016    Conditions to be addressed/monitored: HTN, HLD, and DMII  There are no care plans that you recently modified to display for this patient.     Medication Assistance:   TBD  Patient's preferred pharmacy is:  [facility], [place] [address] Phone: [phone] Fax: [place] [address] [place] [address] Phone: [phone] Fax: [id number] - [place], [place] - [address] AT [place] [address] [place] [address] Phone: [phone] Fax: [phone]  [facility] [address], [place] [address] Phone: [phone] Fax: [phone]  [facility], [place] - [place] [address] Phone: [phone] Fax: [phone]   Follow Up:  Patient agrees to Care Plan and Follow-up.  Plan: Telephone follow up appointment with care management team member scheduled for:  1 month  Larinda Buttery, PharmD Clinical Pharmacist [facility] [phone]

## 2022-08-30 MED ORDER — TIRZEPATIDE 2.5 MG/0.5ML ~~LOC~~ SOAJ: 2.5000 mg | SUBCUTANEOUS | 0 refills | Status: DC

## 2022-09-24 ENCOUNTER — Other Ambulatory Visit: Payer: Self-pay | Admitting: Family Medicine

## 2022-10-04 IMAGING — DX DG CHEST 2V
2 series · 2 of 2 positions shown · non-contrast
Comparison: 06/27/2021

CLINICAL DATA: Chest pain

EXAM:
CHEST - 2 VIEW

[chest lat]
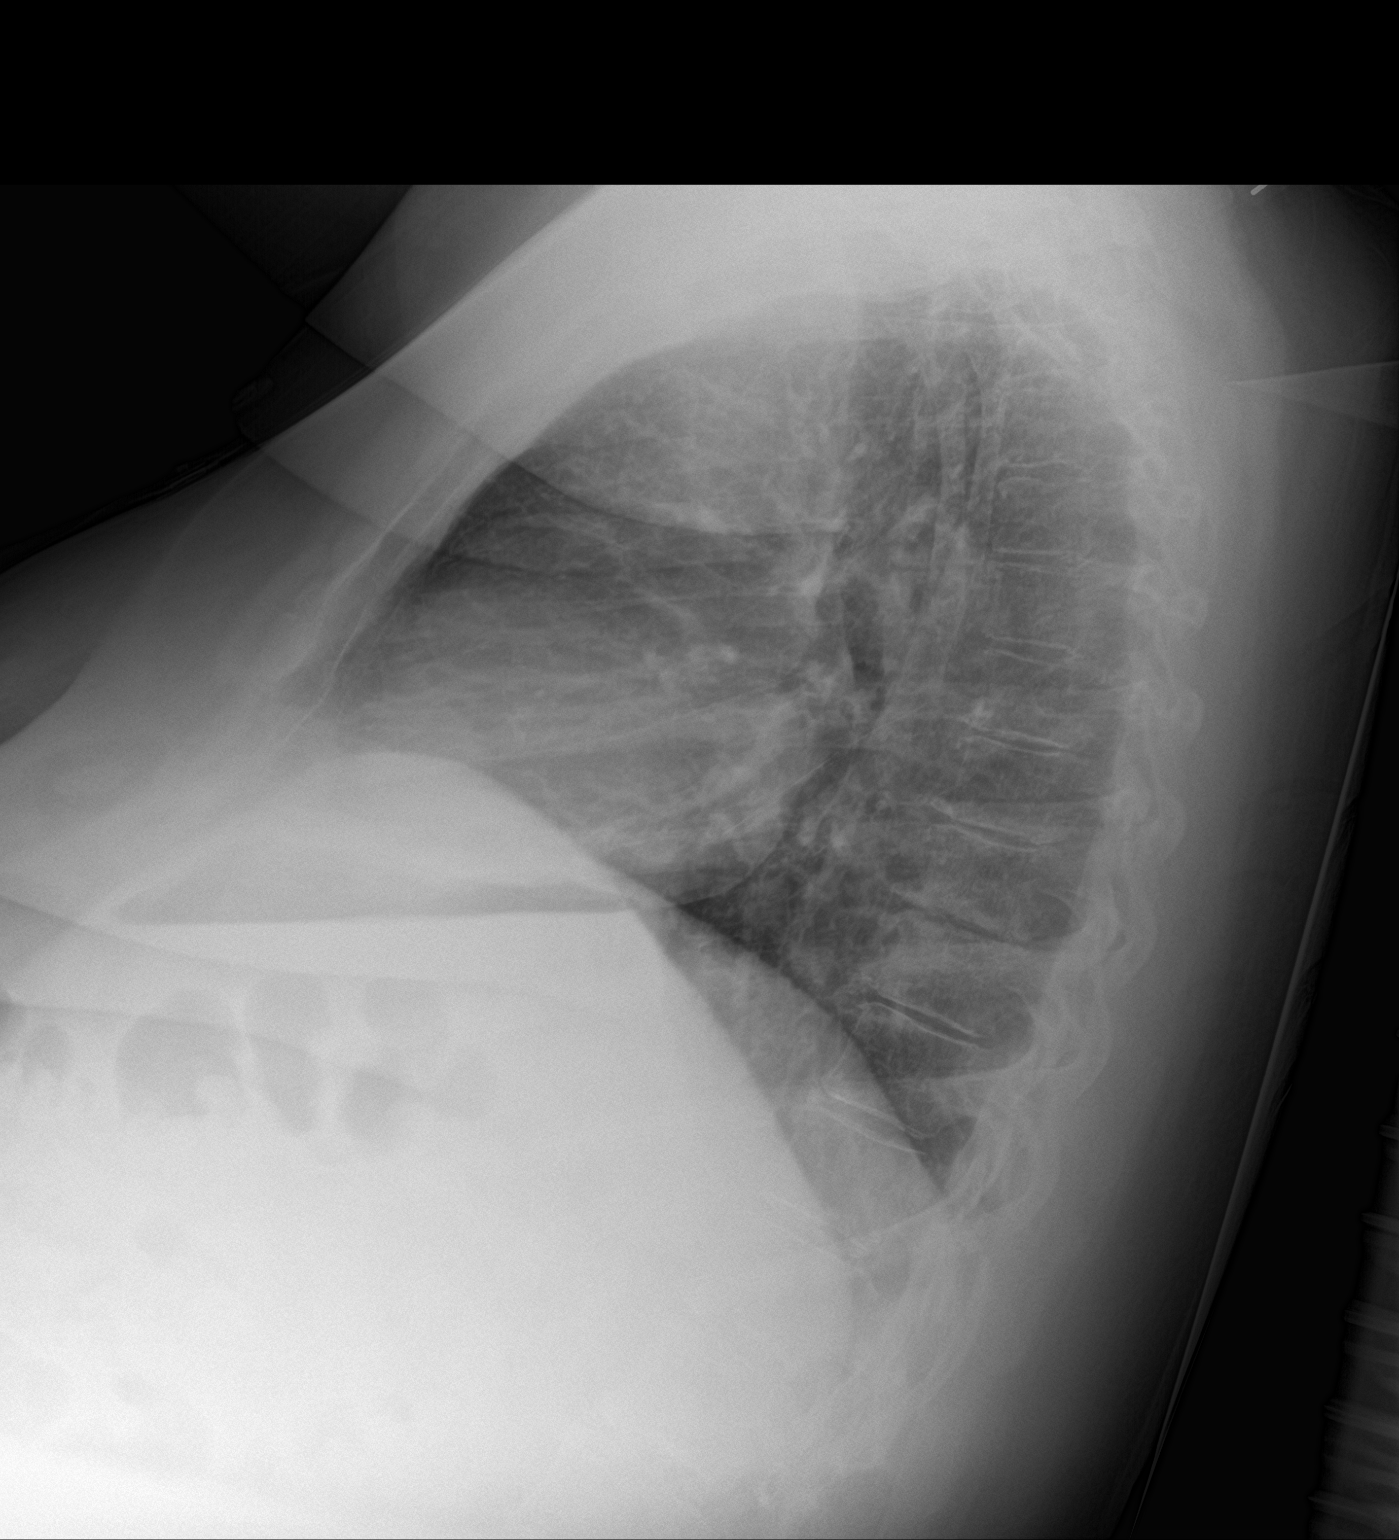

[chest ap strecther]
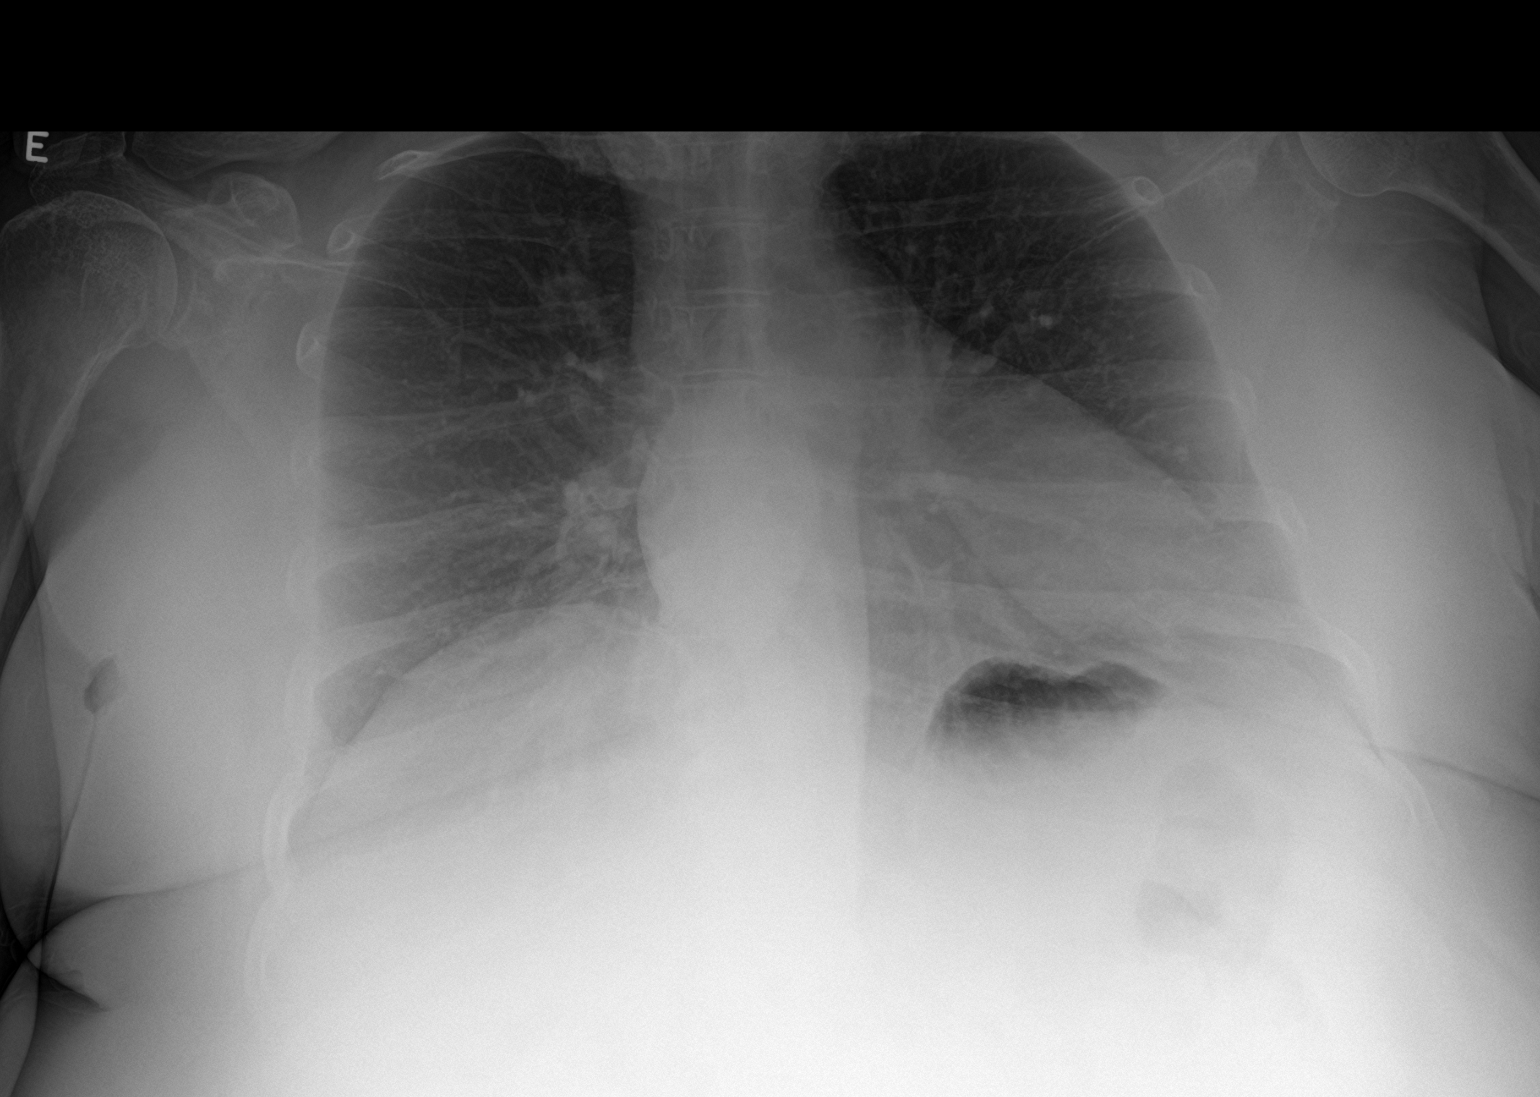

[2 of 2 positions shown; findings below may reference images not displayed]

FINDINGS: Lungs are clear.  No pleural effusion or pneumothorax.

The heart is normal in size.

Visualized osseous structures are within normal limits.
IMPRESSION: Normal chest radiographs.

## 2022-10-04 IMAGING — CT CT HEAD W/O CM
3 series · 15 of 47 positions shown, 18 images · non-contrast
Comparison: None.

CLINICAL DATA: Ocular pain.  Left-sided headache



[Series 2: head wo · axial · 0.45mm/px · z∈[+1146,+1281]mm · 9 of 33 slices shown, 12 images]
[im 3/33  brain]
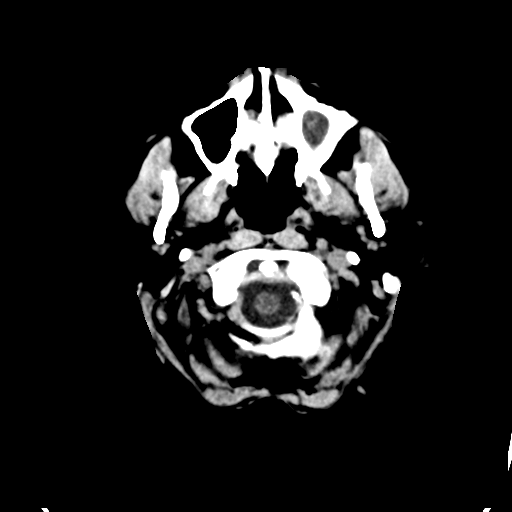
[im 3/33  bone]
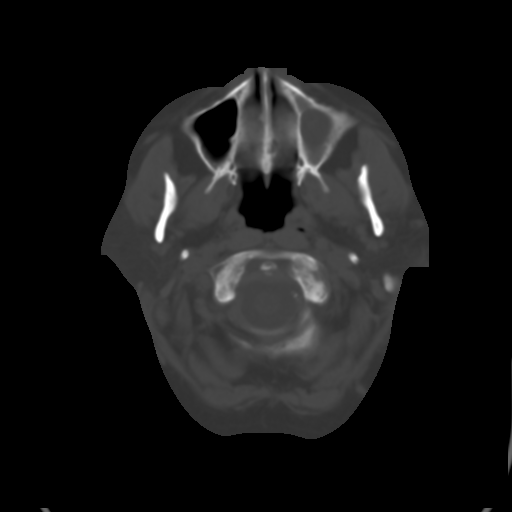
[im 6/33  brain]
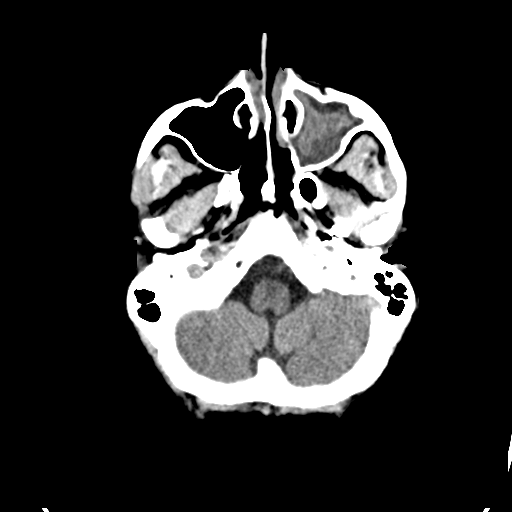
[im 9/33  brain]
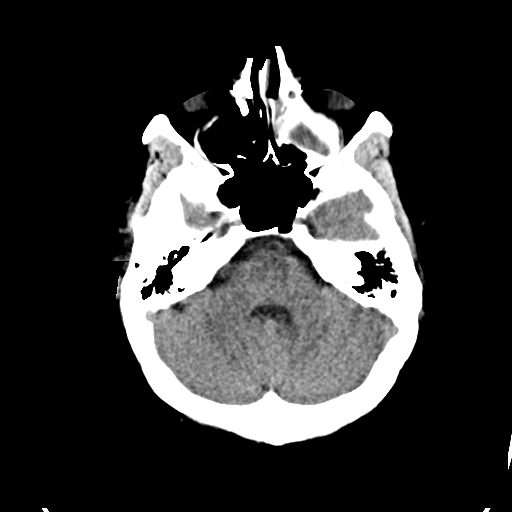
[im 13/33  brain]
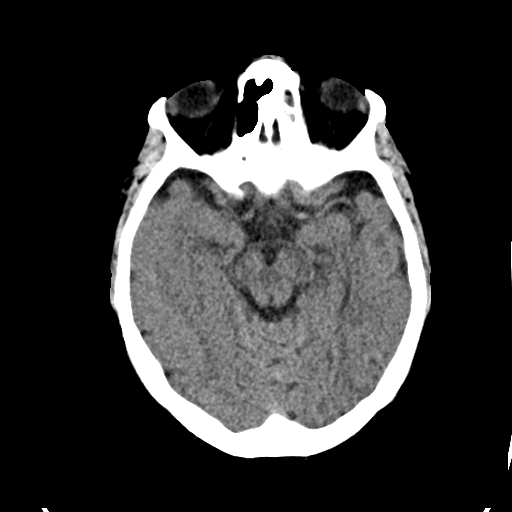
[im 17/33  brain]
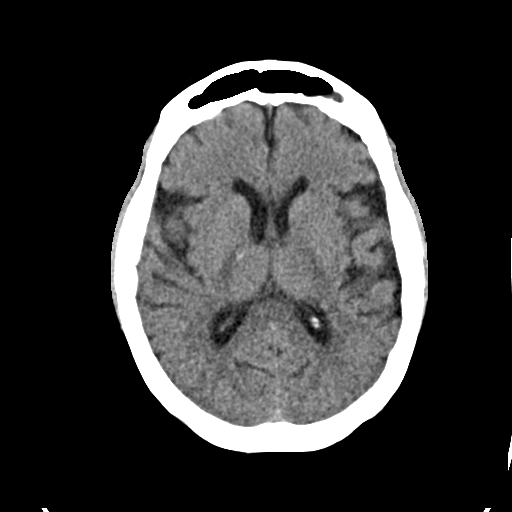
[im 17/33  bone]
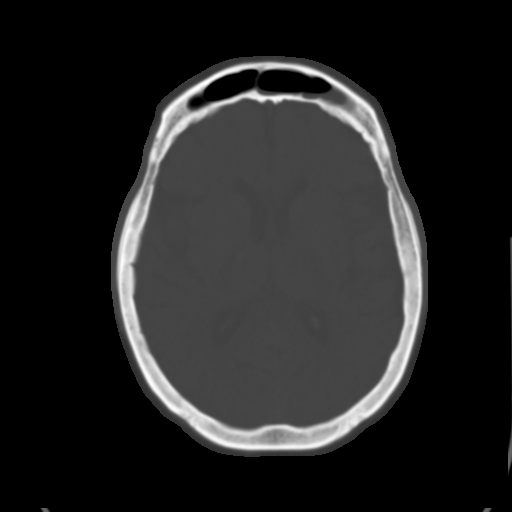
[im 20/33  brain]
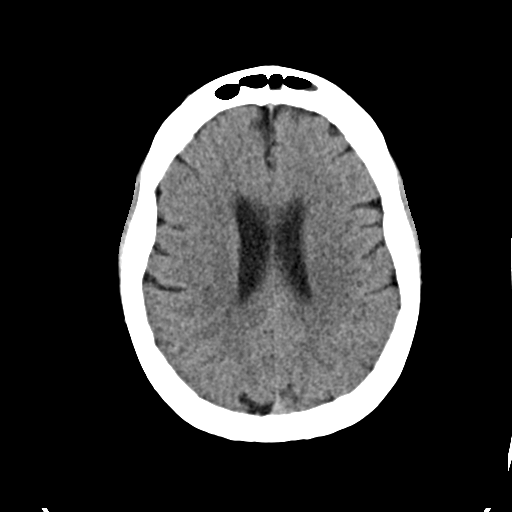
[im 24/33  brain]
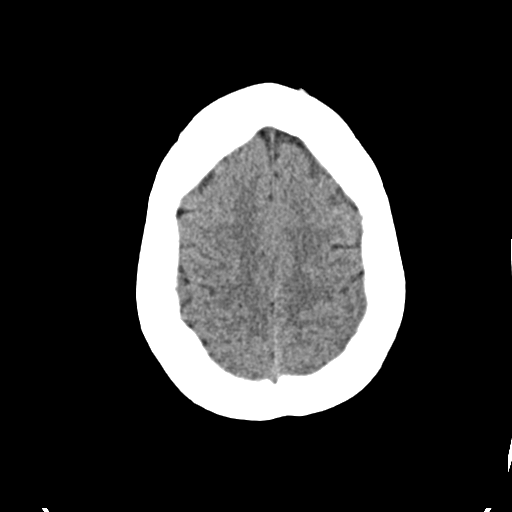
[im 27/33  brain]
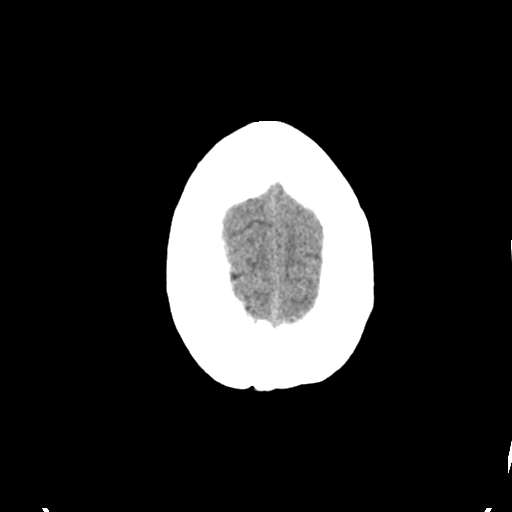
[im 30/33  brain]
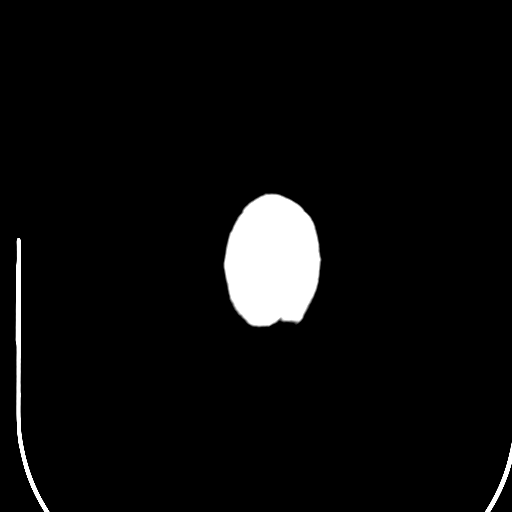
[im 30/33  bone]
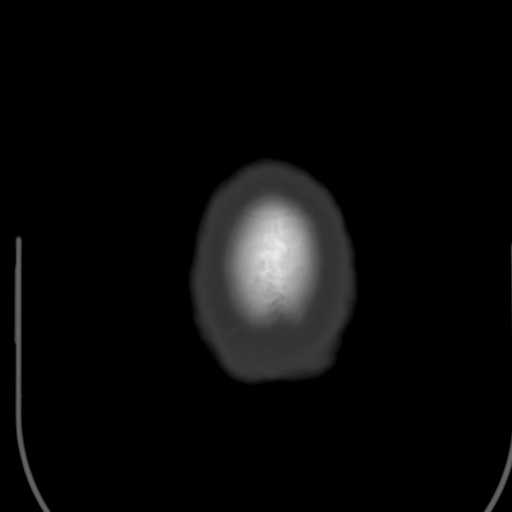

[Series 4: coronal soft · coronal · 0.33mm/px · 3 of 67 slices shown]
[im 23/67  brain]
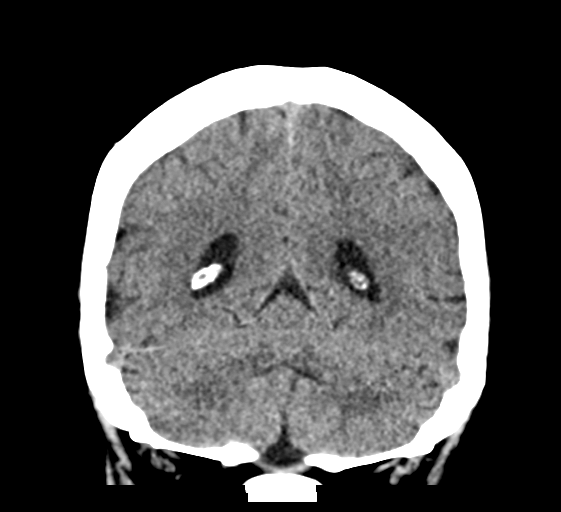
[im 30/67  brain]
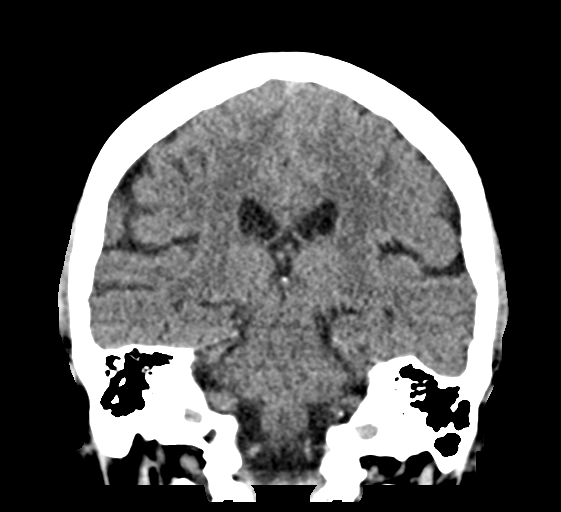
[im 37/67  brain]
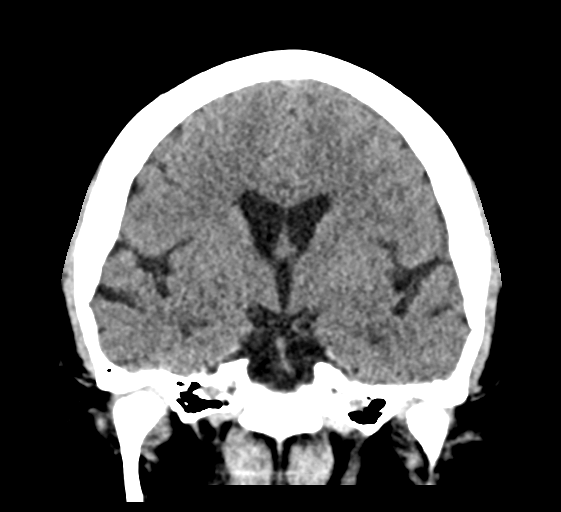

[Series 5: sag soft · sagittal · 0.32mm/px · 3 of 61 slices shown]
[im 21/61  brain]
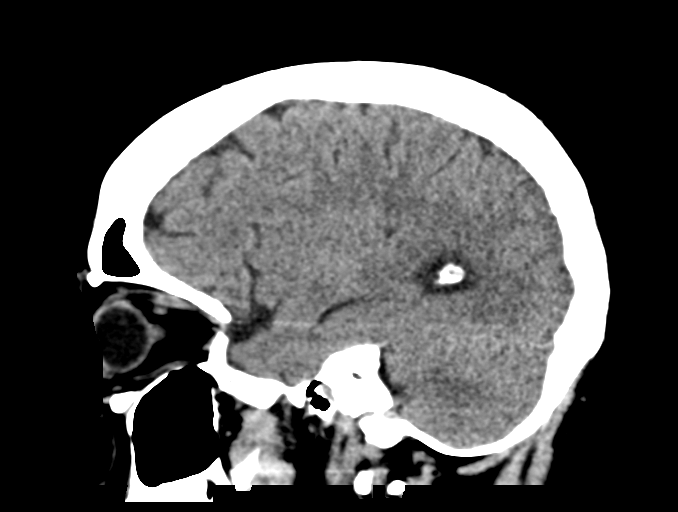
[im 31/61  brain]
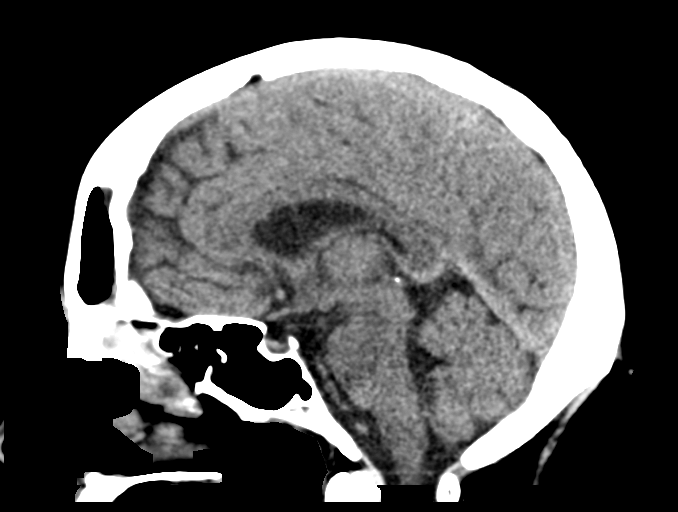
[im 41/61  brain]
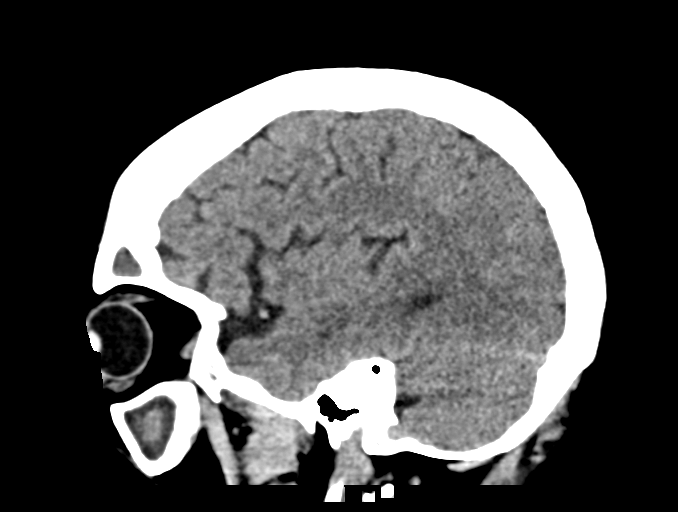

[15 of 47 positions shown; findings below may reference images not displayed]

FINDINGS: Brain: Normal anatomic configuration. No abnormal intra or
extra-axial mass lesion or fluid collection. No abnormal mass effect
or midline shift. No evidence of acute intracranial hemorrhage or
infarct. Ventricular size is normal. Cerebellum unremarkable.

Vascular: Unremarkable

Skull: Intact

Sinuses/Orbits: There is dense opacification of the left sphenoid
sinus, obstruction of the left ostiomeatal unit, dense opacification
of the anterior left ethmoid air cells, and an air-fluid level
within the left frontal sinus. High attenuation material within the
a left maxillary sinus suggests proteinaceous or inspissated
contents. Remaining paranasal sinuses are clear. Orbits are
unremarkable.

Other: Mastoid air cells and middle ear cavities are clear.
IMPRESSION: No acute intracranial abnormality.

Extensive left sinusitis.

## 2022-10-24 ENCOUNTER — Encounter (HOSPITAL_BASED_OUTPATIENT_CLINIC_OR_DEPARTMENT_OTHER): Payer: Self-pay

## 2022-10-24 ENCOUNTER — Emergency Department (HOSPITAL_BASED_OUTPATIENT_CLINIC_OR_DEPARTMENT_OTHER)
Admission: EM | Admit: 2022-10-24 | Discharge: 2022-10-24 | Disposition: A | Discharge status: Home / Self Care | Payer: BC Managed Care – PPO | Attending: Emergency Medicine | Admitting: Emergency Medicine

## 2022-10-24 ENCOUNTER — Emergency Department (HOSPITAL_BASED_OUTPATIENT_CLINIC_OR_DEPARTMENT_OTHER): Payer: BC Managed Care – PPO

## 2022-10-24 DIAGNOSIS — Z79899 Other long term (current) drug therapy: Secondary | ICD-10-CM | POA: Diagnosis not present

## 2022-10-24 DIAGNOSIS — R0789 Other chest pain: Secondary | ICD-10-CM | POA: Diagnosis not present

## 2022-10-24 DIAGNOSIS — I10 Essential (primary) hypertension: Secondary | ICD-10-CM | POA: Diagnosis not present

## 2022-10-24 DIAGNOSIS — I2510 Atherosclerotic heart disease of native coronary artery without angina pectoris: Secondary | ICD-10-CM | POA: Insufficient documentation

## 2022-10-24 DIAGNOSIS — Z7984 Long term (current) use of oral hypoglycemic drugs: Secondary | ICD-10-CM | POA: Insufficient documentation

## 2022-10-24 DIAGNOSIS — Z7982 Long term (current) use of aspirin: Secondary | ICD-10-CM | POA: Diagnosis not present

## 2022-10-24 DIAGNOSIS — Z20822 Contact with and (suspected) exposure to covid-19: Secondary | ICD-10-CM | POA: Diagnosis not present

## 2022-10-24 DIAGNOSIS — Z8616 Personal history of COVID-19: Secondary | ICD-10-CM | POA: Insufficient documentation

## 2022-10-24 DIAGNOSIS — R0602 Shortness of breath: Secondary | ICD-10-CM | POA: Insufficient documentation

## 2022-10-24 DIAGNOSIS — R079 Chest pain, unspecified: Secondary | ICD-10-CM

## 2022-10-24 DIAGNOSIS — Z794 Long term (current) use of insulin: Secondary | ICD-10-CM | POA: Insufficient documentation

## 2022-10-24 DIAGNOSIS — E119 Type 2 diabetes mellitus without complications: Secondary | ICD-10-CM | POA: Diagnosis not present

## 2022-10-24 LAB — BASIC METABOLIC PANEL
Anion gap: 9 (ref 5–15)
BUN: 10 mg/dL (ref 6–20)
CO2: 26 mmol/L (ref 22–32)
Calcium: 8.9 mg/dL (ref 8.9–10.3)
Chloride: 102 mmol/L (ref 98–111)
Creatinine, Ser: 0.77 mg/dL (ref 0.44–1.00)
GFR, Estimated: >60 mL/min (ref >60)
Glucose, Bld: 187 mg/dL — ABNORMAL HIGH (ref 70–99)
Potassium: 3.8 mmol/L (ref 3.5–5.1)
Sodium: 137 mmol/L (ref 135–145)

## 2022-10-24 LAB — CBC
HCT: 44.2 % (ref 36.0–46.0)
Hemoglobin: 14.8 g/dL (ref 12.0–15.0)
MCH: 28 pg (ref 26.0–34.0)
MCHC: 33.5 g/dL (ref 30.0–36.0)
MCV: 83.7 fL (ref 80.0–100.0)
Platelets: 203 10*3/uL (ref 150–400)
RBC: 5.28 MIL/uL — ABNORMAL HIGH (ref 3.87–5.11)
RDW: 12.3 % (ref 11.5–15.5)
WBC: 7.3 10*3/uL (ref 4.0–10.5)
nRBC: 0 % (ref 0.0–0.2)

## 2022-10-24 LAB — TROPONIN I (HIGH SENSITIVITY)
Troponin I (High Sensitivity): <2 ng/L (ref <18)
Troponin I (High Sensitivity): 2 ng/L (ref <18)

## 2022-10-24 LAB — RESP PANEL BY RT-PCR (RSV, FLU A&B, COVID)  RVPGX2
Influenza A by PCR: NEGATIVE
Influenza B by PCR: NEGATIVE
Resp Syncytial Virus by PCR: NEGATIVE
SARS Coronavirus 2 by RT PCR: NEGATIVE

## 2022-10-24 NOTE — ED Notes (Signed)
Reviewed discharge instruction and follow up with pt. Pt states understanding

## 2022-10-24 NOTE — ED Triage Notes (Addendum)
Started having tight chest pain at 0600 this morning while at work. Also c/o shortness of breath and pain to middle of shoulder blades. Hx of MI a year and a half a go with 3 stent placement.  Pt adds that her son has the flu and strep. Pt noticed to have dry cough in triage.

## 2022-10-24 NOTE — ED Provider Notes (Signed)
[facility] Provider Note   CSN: [id number] Arrival date & time: 10/24/22  2725     History  Chief Complaint  Patient presents with   Chest Pain    Sarah Kane is a 52 y.o. female with T2DM, CAD history of NSTEMI, hyperlipidemia, complex regional pain syndrome who resents with CP.   Patient is on the overnight shift at [employer] when at 6 AM she began to have acute onset chest tightness associated with shortness of breath and a "knot" sensation between her shoulder blades.  She states this is exactly how she felt a year and a half ago with an NSTEMI and had 3 stents placed so she became very worried she was having another heart attack and came to the [facility].  She was not doing anything particularly exertional, not heavy lifting at the time of onset of symptoms.  Otherwise in her normal state of health though she does note that her son has influenza right now but she does not have any cough or URI symptoms.  Denies any associated nausea vomiting, abdominal pain, numbness tingling, radiation of the pain, lower extremity edema, hormone use, recent hospitalizations or surgeries or long trips, changes to vision, diarrhea/melena/hematochezia.  Currently her chest tightness has much improved and she feels better.   Chest Pain      Home Medications Prior to Admission medications   Medication Sig Start Date End Date Taking? Authorizing Provider  acetaminophen (TYLENOL) 500 MG tablet Take 1,000 mg by mouth every 6 (six) hours as needed for mild pain or headache.     [provider]  aspirin 81 MG EC tablet Take 1 tablet (81 mg total) by mouth daily. Swallow whole. 06/04/20   Fay Records, MD  atorvastatin (LIPITOR) 80 MG tablet Take 1 tablet by mouth once daily 07/31/22   Fay Records, MD  gabapentin (NEURONTIN) 300 MG capsule Take 1 capsule (300 mg total) by mouth 3 (three) times daily. 08/15/22   Samuel Bouche, NP   glimepiride (AMARYL) 4 MG tablet TAKE 2 TABLETS BY MOUTH ONCE DAILY WITH BREAKFAST. 08/15/22   Jessup, Joy, NP  insulin glargine, 2 Unit Dial, (TOUJEO MAX SOLOSTAR) 300 UNIT/ML Solostar Pen Inject 14 Units into the skin daily. Increase Toujeo to 14 units daily. Check sugars every 3 days. If fasting reading is greater than 130, increase the Toujeo dose by 2 units. If sugar is 90 or less, decrease Toujeo by 2 units. Max Toujeo dose 40 units daily. 08/15/22   Samuel Bouche, NP  Insulin Pen Needle 31G X 5 MM MISC Use one needle with Toujeo insulin pen to inject insulin once daily. 08/15/22   Samuel Bouche, NP  isosorbide mononitrate (IMDUR) 30 MG 24 hr tablet TAKE 1 & 1/2 (ONE & ONE-HALF) TABLETS BY MOUTH ONCE DAILY 07/31/22   Fay Records, MD  metoprolol succinate (TOPROL-XL) 25 MG 24 hr tablet Take 1 tablet (25 mg total) by mouth 2 (two) times daily with a meal. 08/15/22   Jessup, Joy, NP  MOUNJARO 2.5 MG/0.5ML Pen INJECT 1/2 (ONE-HALF) ML (2.5MG ) INTO THE SKIN  ONCE A WEEK 09/27/22   Samuel Bouche, NP  prazosin (MINIPRESS) 2 MG capsule Take 1 capsule (2 mg total) by mouth at bedtime. 08/15/22   Samuel Bouche, NP  sitaGLIPtin (JANUVIA) 100 MG tablet Take 1 tablet (100 mg total) by mouth daily. 08/15/22   Samuel Bouche, NP  ticagrelor (BRILINTA) 90 MG TABS tablet Take  1 tablet (90 mg total) by mouth 2 (two) times daily. 08/15/22   Christen Butter, NP      Allergies    Nitroglycerin    Review of Systems   Review of Systems  Cardiovascular:  Positive for chest pain.   Review of systems Negative for f/c.  A 10 point review of systems was performed and is negative unless otherwise reported in HPI.  Physical Exam Updated Vital Signs BP (!) 156/97   Pulse 65   Temp 97.9 F (36.6 C) (Oral)   Resp 16   Ht 5' (1.524 m)   Wt 87.4 kg   SpO2 100%   BMI 37.63 kg/m  Physical Exam General: Normal appearing female, lying in bed.  HEENT: PERRLA, Sclera anicteric, MMM, trachea midline.  Cardiology: RRR, no  murmurs/rubs/gallops. BL radial and DP pulses equal bilaterally.  Resp: Normal respiratory rate and effort. CTAB, no wheezes, rhonchi, crackles.  Abd: Soft, non-tender, non-distended. No rebound tenderness or guarding.  GU: Deferred. MSK: No peripheral edema or signs of trauma. Extremities without deformity or TTP. No cyanosis or clubbing. Skin: warm, dry. No rashes or lesions. Back: No CVA tenderness Neuro: A&Ox4, CNs II-XII grossly intact. MAEs. Sensation grossly intact.  Psych: Normal mood and affect.   ED Results / Procedures / Treatments   Labs (all labs ordered are listed, but only abnormal results are displayed) Labs Reviewed  BASIC METABOLIC PANEL - Abnormal; Notable for the following components:      Result Value   Glucose, Bld 187 (*)    All other components within normal limits  CBC - Abnormal; Notable for the following components:   RBC 5.28 (*)    All other components within normal limits  RESP PANEL BY RT-PCR (RSV, FLU A&B, COVID)  [id number]  TROPONIN I (HIGH SENSITIVITY)  TROPONIN I (HIGH SENSITIVITY)    EKG EKG Interpretation  Date/Time:  Tuesday October 24 2022 07:45:59 EST Ventricular Rate:  68 PR Interval:  161 QRS Duration: 83 QT Interval:  403 QTC Calculation: 429 R Axis:   71 Text Interpretation: Sinus rhythm No significant change since last tracing Confirmed by Vivi Barrack [phone]) on 10/24/2022 7:53:34 AM  Radiology DG Chest 2 View  Result Date: 10/24/2022 CLINICAL DATA:  Provided history: Chest pain. Chest tightness. Shortness of breath. Pain between shoulder blades. EXAM: CHEST - 2 VIEW COMPARISON:  Radiographs 01/18/2022 and earlier. FINDINGS: Heart size within normal limits. No appreciable airspace consolidation. No evidence of pleural effusion or pneumothorax. No acute bony abnormality identified. Degenerative changes of the spine. IMPRESSION: No evidence of active cardiopulmonary disease. Electronically Signed   By: Jackey Loge D.O.   On:  10/24/2022 08:12    Procedures Procedures    Medications Ordered in ED Medications - No data to display  ED Course/ Medical Decision Making/ A&P                          Medical Decision Making Amount and/or Complexity of Data Reviewed Labs: ordered. Decision-making details documented in ED Course. Radiology: ordered. Decision-making details documented in ED Course.    This patient presents to the ED for concern of chest tightness, this involves an extensive number of treatment options, and is a complaint that carries with it a high risk of complications and morbidity.  I considered the following differential and admission for this acute, potentially life threatening condition.   MDM:    For patient's chest tightness and shortness of breath,  consider ACS versus arrhythmia and patient states that this feels a lot like her MI that occurred a year and a half ago so we will obtain troponin.  EKG from triage is normal sinus rhythm without any ischemic changes which is very reassuring and patient states she feels improved currently.  Given patient's chest tightness with a "knot" sensation in the back must consider aortic dissection and she does have a history of hypertension however patient has equal pulses in all 4 extremities, does not describe chest pain just tightness, will obtain a screening chest x-ray but overall low concern for dissection. In triage and on my exam patient is noticed of a dry cough and since her son has influenza we will test for viral panel will assess chest x-ray for pneumonia or PTX that she has equal breath sounds bilaterally.  Patient cannot PERC out due to age but she has no signs or symptoms of DVT and minimal risk factors for PE.  Clinical Course as of 10/24/22 1053  Tue Oct 24, 2022  1191 Troponin I (High Sensitivity): <2 [HN]  [id number] Resp panel by RT-PCR (RSV, Flu A&B, Covid) Anterior Nasal Swab Neg [HN]  9562 DG Chest 2 View FINDINGS: Heart size within  normal limits. No appreciable airspace consolidation. No evidence of pleural effusion or pneumothorax. No acute bony abnormality identified. Degenerative changes of the spine.  IMPRESSION: No evidence of active cardiopulmonary disease.   [HN]  1308 Basic metabolic panel(!) wnl [HN]  0853 CBC(!) unremarkable [HN]  1049 Troponin I (High Sensitivity): 2 [HN]  1051 Troponin 2-2, patient very reassuring workup and she feels normal and at her baseline currently.  Heart score is 3-4. Patient is instructed to take all of her medications as prescribed and to follow-up with her cardiologist within the next week.  Given discharge instruction return precautions, all questions answered to patient satisfaction. [HN]    Clinical Course User Index [HN] Audley Hose, MD    Labs: I Ordered, and personally interpreted labs.  The pertinent results include: Those listed above  Imaging Studies ordered: I ordered imaging studies including cxr I independently visualized and interpreted imaging. I agree with the radiologist interpretation  Additional history obtained from chart review.    Cardiac Monitoring: The patient was maintained on a cardiac monitor.  I personally viewed and interpreted the cardiac monitored which showed an underlying rhythm of: nsr  Reevaluation: After the interventions noted above, I reevaluated the patient and found that they have :resolved  Social Determinants of Health: Patient lives independently   Disposition:  DC  Co morbidities that complicate the patient evaluation  Past Medical History:  Diagnosis Date   Arthritis    Coronary artery disease    COVID-19 05/2020   Diabetes mellitus without complication ([place])    Diabetic retinopathy ([place])    Hyperlipidemia    Hypertension    Hypertensive retinopathy    Myocardial infarct, old      Medicines No orders of the defined types were placed in this encounter.   I have reviewed the patients home medicines  and have made adjustments as needed  Problem List / ED Course: Problem List Items Addressed This Visit   None Visit Diagnoses     Chest pain, unspecified type    -  Primary                   This note was created using dictation software, which may contain spelling or grammatical errors.  Audley Hose, MD 10/24/22 [phone]

## 2022-10-24 NOTE — Discharge Instructions (Signed)
Thank you for coming to [facility] Emergency Department. You were seen for chest pain with shortness of breath and lightheadedness. We did an exam, labs, and imaging, and these showed  no acute findings.  You can take Tylenol for your discomfort. Please follow up with your primary care provider cardiologist within 1 week.  You can call them today to make an appointment.  Do not hesitate to return to the ED or call 911 if you experience: -Worsening symptoms -Chest pain, shortness of breath -Lightheadedness, passing out -Fevers/chills -Anything else that concerns you

## 2022-10-27 ENCOUNTER — Telehealth: Payer: Self-pay | Admitting: General Practice

## 2022-10-27 NOTE — Telephone Encounter (Signed)
Transition Care Management Unsuccessful Follow-up Telephone Call  Date of discharge and from where:  10/24/22 from [facility]  Attempts:  1st Attempt  Reason for unsuccessful TCM follow-up call:  Left voice message

## 2022-10-30 NOTE — Telephone Encounter (Signed)
Transition Care Management Unsuccessful Follow-up Telephone Call  Date of discharge and from where:  10/24/22 from [facility]  Attempts:  2nd Attempt  Reason for unsuccessful TCM follow-up call:  No answer/busy

## 2022-11-01 ENCOUNTER — Emergency Department (HOSPITAL_BASED_OUTPATIENT_CLINIC_OR_DEPARTMENT_OTHER)
Admission: EM | Admit: 2022-11-01 | Discharge: 2022-11-01 | Disposition: A | Discharge status: Home / Self Care | Payer: BC Managed Care – PPO | Attending: Emergency Medicine | Admitting: Emergency Medicine

## 2022-11-01 ENCOUNTER — Other Ambulatory Visit: Payer: Self-pay

## 2022-11-01 ENCOUNTER — Encounter (HOSPITAL_BASED_OUTPATIENT_CLINIC_OR_DEPARTMENT_OTHER): Payer: Self-pay | Admitting: Pediatrics

## 2022-11-01 ENCOUNTER — Emergency Department (HOSPITAL_BASED_OUTPATIENT_CLINIC_OR_DEPARTMENT_OTHER): Payer: BC Managed Care – PPO

## 2022-11-01 DIAGNOSIS — W01198A Fall on same level from slipping, tripping and stumbling with subsequent striking against other object, initial encounter: Secondary | ICD-10-CM | POA: Insufficient documentation

## 2022-11-01 DIAGNOSIS — E119 Type 2 diabetes mellitus without complications: Secondary | ICD-10-CM | POA: Diagnosis not present

## 2022-11-01 DIAGNOSIS — Z7982 Long term (current) use of aspirin: Secondary | ICD-10-CM | POA: Diagnosis not present

## 2022-11-01 DIAGNOSIS — Z79899 Other long term (current) drug therapy: Secondary | ICD-10-CM | POA: Diagnosis not present

## 2022-11-01 DIAGNOSIS — R059 Cough, unspecified: Secondary | ICD-10-CM | POA: Diagnosis not present

## 2022-11-01 DIAGNOSIS — M25511 Pain in right shoulder: Secondary | ICD-10-CM | POA: Insufficient documentation

## 2022-11-01 DIAGNOSIS — R220 Localized swelling, mass and lump, head: Secondary | ICD-10-CM | POA: Insufficient documentation

## 2022-11-01 DIAGNOSIS — R0789 Other chest pain: Secondary | ICD-10-CM | POA: Diagnosis not present

## 2022-11-01 DIAGNOSIS — S0993XA Unspecified injury of face, initial encounter: Secondary | ICD-10-CM | POA: Diagnosis not present

## 2022-11-01 DIAGNOSIS — Z7984 Long term (current) use of oral hypoglycemic drugs: Secondary | ICD-10-CM | POA: Diagnosis not present

## 2022-11-01 DIAGNOSIS — R55 Syncope and collapse: Secondary | ICD-10-CM | POA: Insufficient documentation

## 2022-11-01 DIAGNOSIS — R519 Headache, unspecified: Secondary | ICD-10-CM | POA: Diagnosis not present

## 2022-11-01 DIAGNOSIS — T148XXA Other injury of unspecified body region, initial encounter: Secondary | ICD-10-CM

## 2022-11-01 DIAGNOSIS — S46919A Strain of unspecified muscle, fascia and tendon at shoulder and upper arm level, unspecified arm, initial encounter: Secondary | ICD-10-CM | POA: Diagnosis not present

## 2022-11-01 DIAGNOSIS — S29011A Strain of muscle and tendon of front wall of thorax, initial encounter: Secondary | ICD-10-CM | POA: Diagnosis not present

## 2022-11-01 DIAGNOSIS — R079 Chest pain, unspecified: Secondary | ICD-10-CM | POA: Diagnosis not present

## 2022-11-01 LAB — CBC WITH DIFFERENTIAL/PLATELET
Abs Immature Granulocytes: 0.03 10*3/uL (ref 0.00–0.07)
Basophils Absolute: 0 10*3/uL (ref 0.0–0.1)
Basophils Relative: 0 %
Eosinophils Absolute: 0.1 10*3/uL (ref 0.0–0.5)
Eosinophils Relative: 1 %
HCT: 47.3 % — ABNORMAL HIGH (ref 36.0–46.0)
Hemoglobin: 15.6 g/dL — ABNORMAL HIGH (ref 12.0–15.0)
Immature Granulocytes: 1 %
Lymphocytes Relative: 29 %
Lymphs Abs: 1.5 10*3/uL (ref 0.7–4.0)
MCH: 27.8 pg (ref 26.0–34.0)
MCHC: 33 g/dL (ref 30.0–36.0)
MCV: 84.2 fL (ref 80.0–100.0)
Monocytes Absolute: 0.3 10*3/uL (ref 0.1–1.0)
Monocytes Relative: 6 %
Neutro Abs: 3.3 10*3/uL (ref 1.7–7.7)
Neutrophils Relative %: 63 %
Platelets: 219 10*3/uL (ref 150–400)
RBC: 5.62 MIL/uL — ABNORMAL HIGH (ref 3.87–5.11)
RDW: 12.4 % (ref 11.5–15.5)
WBC: 5.2 10*3/uL (ref 4.0–10.5)
nRBC: 0 % (ref 0.0–0.2)

## 2022-11-01 LAB — COMPREHENSIVE METABOLIC PANEL
ALT: 12 U/L (ref 0–44)
AST: 23 U/L (ref 15–41)
Albumin: 4.2 g/dL (ref 3.5–5.0)
Alkaline Phosphatase: 51 U/L (ref 38–126)
Anion gap: 8 (ref 5–15)
BUN: 19 mg/dL (ref 6–20)
CO2: 26 mmol/L (ref 22–32)
Calcium: 8.3 mg/dL — ABNORMAL LOW (ref 8.9–10.3)
Chloride: 100 mmol/L (ref 98–111)
Creatinine, Ser: 1.02 mg/dL — ABNORMAL HIGH (ref 0.44–1.00)
GFR, Estimated: >60 mL/min (ref >60)
Glucose, Bld: 165 mg/dL — ABNORMAL HIGH (ref 70–99)
Potassium: 3.6 mmol/L (ref 3.5–5.1)
Sodium: 134 mmol/L — ABNORMAL LOW (ref 135–145)
Total Bilirubin: 0.5 mg/dL (ref 0.3–1.2)
Total Protein: 7 g/dL (ref 6.5–8.1)

## 2022-11-01 LAB — TROPONIN I (HIGH SENSITIVITY)
Troponin I (High Sensitivity): 2 ng/L (ref <18)
Troponin I (High Sensitivity): <2 ng/L (ref <18)

## 2022-11-01 MED ORDER — LIDOCAINE 5 % EX PTCH: 1.0000 | MEDICATED_PATCH | Freq: Once | CUTANEOUS | Status: DC

## 2022-11-01 MED ORDER — LIDOCAINE 5 % EX PTCH
1.0000 | MEDICATED_PATCH | CUTANEOUS | Status: DC
  Administered 2022-11-01: 1 via TRANSDERMAL
  Filled 2022-11-01: qty 1

## 2022-11-01 NOTE — ED Triage Notes (Signed)
C/O right sided clavicular pain, shoulder pain that is worst with coughing. Reports she passed out on Saturday cause was "sick"; stated she's been feeling unwell for awhile.

## 2022-11-01 NOTE — Discharge Instructions (Signed)
Please pick up the lidocaine patches I prescribed for you.  You may alternate between Tylenol and ibuprofen every 6 hours as needed for pain as prescribed.  Please book appointment with your primary care provider to be seen in the next few days to be reevaluated.  If symptoms worsen please return to ER.

## 2022-11-01 NOTE — ED Notes (Signed)
Pt transported to radiology.

## 2022-11-01 NOTE — ED Provider Notes (Signed)
[facility] Provider Note   CSN: [id number] Arrival date & time: 11/01/22  1433     History  Chief Complaint  Patient presents with   Cough    Sarah Kane is a 52 y.o. female history of type 2 diabetes and NSTEMI presented with right shoulder pain for 4 days.  Patient states she passed out in her bathroom fell hit her head and does not know if she hit her shoulder but woke up and had shoulder pain.  Patient states she was taking a hot shower sat down and then stood up and that is when she had a syncopal episode.  Patient denied any presyncopal chest pain, shortness of breath, dizziness.  Patient has tried ibuprofen to no relief.  Patient states she feels weak in that shoulder and that it hurts when she coughs.  Patient states the right side of her head also hurts and has noticed some swelling around her right eye.  Patient denied any blood thinners  Patient denied any chest pain, shortness of breath, blurry vision/changes in vision, changes in sensation, abdominal pain, headache  Home Medications Prior to Admission medications   Medication Sig Start Date End Date Taking? Authorizing Provider  acetaminophen (TYLENOL) 500 MG tablet Take 1,000 mg by mouth every 6 (six) hours as needed for mild pain or headache.     [provider]  aspirin 81 MG EC tablet Take 1 tablet (81 mg total) by mouth daily. Swallow whole. 06/04/20   Fay Records, MD  atorvastatin (LIPITOR) 80 MG tablet Take 1 tablet by mouth once daily 07/31/22   Fay Records, MD  gabapentin (NEURONTIN) 300 MG capsule Take 1 capsule (300 mg total) by mouth 3 (three) times daily. 08/15/22   Samuel Bouche, NP  glimepiride (AMARYL) 4 MG tablet TAKE 2 TABLETS BY MOUTH ONCE DAILY WITH BREAKFAST. 08/15/22   Jessup, Joy, NP  insulin glargine, 2 Unit Dial, (TOUJEO MAX SOLOSTAR) 300 UNIT/ML Solostar Pen Inject 14 Units into the skin daily. Increase Toujeo to 14 units daily. Check sugars  every 3 days. If fasting reading is greater than 130, increase the Toujeo dose by 2 units. If sugar is 90 or less, decrease Toujeo by 2 units. Max Toujeo dose 40 units daily. 08/15/22   Samuel Bouche, NP  Insulin Pen Needle 31G X 5 MM MISC Use one needle with Toujeo insulin pen to inject insulin once daily. 08/15/22   Samuel Bouche, NP  isosorbide mononitrate (IMDUR) 30 MG 24 hr tablet TAKE 1 & 1/2 (ONE & ONE-HALF) TABLETS BY MOUTH ONCE DAILY 07/31/22   Fay Records, MD  metoprolol succinate (TOPROL-XL) 25 MG 24 hr tablet Take 1 tablet (25 mg total) by mouth 2 (two) times daily with a meal. 08/15/22   Jessup, Joy, NP  MOUNJARO 2.5 MG/0.5ML Pen INJECT 1/2 (ONE-HALF) ML (2.5MG ) INTO THE SKIN  ONCE A WEEK 09/27/22   Samuel Bouche, NP  prazosin (MINIPRESS) 2 MG capsule Take 1 capsule (2 mg total) by mouth at bedtime. 08/15/22   Samuel Bouche, NP  sitaGLIPtin (JANUVIA) 100 MG tablet Take 1 tablet (100 mg total) by mouth daily. 08/15/22   Samuel Bouche, NP  ticagrelor (BRILINTA) 90 MG TABS tablet Take 1 tablet (90 mg total) by mouth 2 (two) times daily. 08/15/22   Samuel Bouche, NP      Allergies    Nitroglycerin    Review of Systems   Review of Systems  Respiratory:  Positive for cough.   See HPI  Physical Exam Updated Vital Signs BP 106/81   Pulse 91   Temp 98.1 F (36.7 C) (Oral)   Resp 18   Ht 5' (1.524 m)   Wt 87.4 kg   SpO2 99%   BMI 37.63 kg/m  Physical Exam Vitals and nursing note reviewed.  Constitutional:      General: She is not in acute distress.    Appearance: She is well-developed.  HENT:     Head: Normocephalic and atraumatic.     Comments: Edema on right side of face Tender to palpation on right side of face No step-offs or crepitus appreciated Eyes:     Conjunctiva/sclera: Conjunctivae normal.     Comments: Patient states in her right eye it hurts to look laterally  Cardiovascular:     Rate and Rhythm: Normal rate and regular rhythm.     Heart sounds: No murmur heard.     Comments: 2+ bilateral radial/posterior tibialis pulses with regular rate Pulmonary:     Effort: Pulmonary effort is normal. No respiratory distress.     Breath sounds: Normal breath sounds.  Abdominal:     Palpations: Abdomen is soft.     Tenderness: There is no abdominal tenderness. There is no guarding.  Musculoskeletal:        General: No swelling. Normal range of motion.       Arms:     Cervical back: Neck supple.     Comments: Positive empty can test and right shoulder Negative O'Brien's test Tenderness around shoulder with palpation Right-sided paraspinal muscular tenderness  Skin:    General: Skin is warm and dry.     Capillary Refill: Capillary refill takes less than 2 seconds.  Neurological:     Mental Status: She is alert.     Cranial Nerves: Cranial nerves 2-12 are intact.     Sensory: Sensation is intact.     Motor: Motor function is intact.     Coordination: Coordination is intact.     Gait: Gait is intact.     Comments: Sensation intact in all 4 limbs 5 out of 5 bilateral grip strength 5 out of 5 bilateral hip flexion  Psychiatric:        Mood and Affect: Mood normal.     ED Results / Procedures / Treatments   Labs (all labs ordered are listed, but only abnormal results are displayed) Labs Reviewed  CBC WITH DIFFERENTIAL/PLATELET - Abnormal; Notable for the following components:      Result Value   RBC 5.62 (*)    Hemoglobin 15.6 (*)    HCT 47.3 (*)    All other components within normal limits  COMPREHENSIVE METABOLIC PANEL - Abnormal; Notable for the following components:   Sodium 134 (*)    Glucose, Bld 165 (*)    Creatinine, Ser 1.02 (*)    Calcium 8.3 (*)    All other components within normal limits  TROPONIN I (HIGH SENSITIVITY)  TROPONIN I (HIGH SENSITIVITY)    EKG None  Radiology CT Maxillofacial WO CM  Result Date: 11/01/2022 CLINICAL DATA:  Blunt facial trauma EXAM: CT MAXILLOFACIAL WITHOUT CONTRAST TECHNIQUE: Multidetector CT imaging  of the maxillofacial structures was performed. Multiplanar CT image reconstructions were also generated. RADIATION DOSE REDUCTION: This exam was performed according to the departmental dose-optimization program which includes automated exposure control, adjustment of the mA and/or kV according to patient size and/or use of iterative reconstruction technique. COMPARISON:  03/19/2021 FINDINGS:  Osseous: No fracture or mandibular dislocation. No destructive process. Orbits: Negative. No traumatic or inflammatory finding. Sinuses: Significant mucoperiosteal thickening within the left maxillary sinus measuring up to 1.4 cm. More mild thickening within the frontal, ethmoid, sphenoid, and right maxillary sinus. No superimposed gas fluid levels. Soft tissues: Negative. Limited intracranial: No significant or unexpected finding. IMPRESSION: 1. No acute facial bone fracture. 2. Paranasal sinus disease as above, most pronounced within the left maxillary sinus. Electronically Signed   By: Sharlet Salina M.D.   On: 11/01/2022 17:11   DG Shoulder Right  Result Date: 11/01/2022 CLINICAL DATA:  Right shoulder pain and clavicular pain. EXAM: RIGHT SHOULDER - 2+ VIEW COMPARISON:  None Available. FINDINGS: Mild acromioclavicular joint space narrowing and peripheral osteophytosis. Mild inferior glenohumeral joint space narrowing with peripheral glenoid degenerative osteophytes. No acute fracture is seen. No dislocation. The visualized portion of the right lung is unremarkable. IMPRESSION: Mild acromioclavicular and glenohumeral osteoarthritis. Electronically Signed   By: Neita Garnet M.D.   On: 11/01/2022 16:59   DG Chest 2 View  Result Date: 11/01/2022 CLINICAL DATA:  Cough, clavicle and shoulder blade pain EXAM: CHEST - 2 VIEW COMPARISON:  10/24/2022 FINDINGS: The heart size and mediastinal contours are within normal limits. Both lungs are clear. The visualized skeletal structures are unremarkable. IMPRESSION: No active  cardiopulmonary disease. Electronically Signed   By: Sharlet Salina M.D.   On: 11/01/2022 15:15    Procedures Procedures    Medications Ordered in ED Medications  lidocaine (LIDODERM) 5 % 1 patch (has no administration in time range)    ED Course/ Medical Decision Making/ A&P                             Medical Decision Making Amount and/or Complexity of Data Reviewed Labs: ordered. Radiology: ordered.   CYNETHIA SCHINDLER 52 y.o. presented today for right shoulder pain and R face pain. Working DDx that I considered at this time includes, but not limited to, orbital fracture, skull fracture, torn supraspinatus, AC tear, humeral head fracture, ACS, CVA/TIA, liver laceration.  Review of prior external notes: 10/24/22 ED provider  Unique Tests and My Interpretation:  Troponin: 2, <2 CMP: Creatinine slightly elevated to 1.02, hypocalcemia 8.3 CBC differential: Unremarkable Chest x-ray: Unremarkable EKG: Unremarkable CT maxillofacial without contrast: Right shoulder x-ray: Mild acromial clavicular and glenohumeral osteoarthritis, no signs of fractures or deformities  Discussion with Independent Historian: None  Discussion of Management of Tests: None  Risk:   Medium:  - prescription drug management  Risk Stratification Score: None  Staffed with Charm Barges, MD  R/o DDx: AC tear: No piano signs noted on exam Humeral head fracture: Shoulder x-ray was negative ACS: Negative EKG and negative serial troponins Skull fracture/CVA/TIA: CT was negative and negative neuroexam Orbital fracture: CT was negative Liver laceration: liver enzymes are normal and patient had no tenderness to abd or complained of abd pain  Plan: Patient presented with right shoulder pain after having a syncopal episode 4 days ago.  Patient was tender on exam lateral to her right eye and there is noticeable edema and so a CT maxillofacial was ordered to evaluate for any orbital/skull fracture.  Patient also had a  positive empty can test and was tender in the shoulder with palpation and so an x-ray of the shoulder was ordered to evaluate for any fractures/abnormalities.    CT maxillofacial was negative for any skull fractures or intracranial changes.  Right  shoulder x-ray was negative for any acute changes but did show chronic osteoarthritis.  Patient states she has chronic right shoulder weakness and so I do not think she has any acute supraspinatus injuries at this time.  Patient was tender in the right paraspinal musculature and so I believe she perhaps strained a muscle when she fell.  Patient will be given a lidocaine patch here and prescribed lidocaine patches and was encouraged to follow-up with her primary care provider in the next few days.  Patient was also encouraged to alternate between taking ibuprofen and Tylenol every 6 hours as needed for pain.  Patient negative neuroexam and with negative imaging findings I do not suspect any life-threatening diagnoses at this time.  Patient will be given a lidocaine patch at this time and prescribed lidocaine patches.  Patient verbalized agreement to this plan.         Final Clinical Impression(s) / ED Diagnoses Final diagnoses:  Muscle strain    Rx / DC Orders ED Discharge Orders     None         Elvina Sidle 11/01/22 1813    Hayden Rasmussen, MD 11/02/22 1007

## 2022-11-03 ENCOUNTER — Telehealth: Payer: Self-pay | Admitting: General Practice

## 2022-11-03 NOTE — Telephone Encounter (Signed)
Transition Care Management Unsuccessful Follow-up Telephone Call  Date of discharge and from where:  11/01/22 from [facility]  Attempts:  1st Attempt  Reason for unsuccessful TCM follow-up call:  Left voice message

## 2022-11-03 NOTE — Telephone Encounter (Signed)
Transition Care Management Unsuccessful Follow-up Telephone Call  Date of discharge and from where:  10/24/22 from [facility]  Attempts:  3rd Attempt  Reason for unsuccessful TCM follow-up call:  No answer/busy

## 2022-11-06 NOTE — Telephone Encounter (Signed)
Transition Care Management Unsuccessful Follow-up Telephone Call  Date of discharge and from where:  11/01/22 from [facility]  Attempts:  2nd Attempt  Reason for unsuccessful TCM follow-up call:  Left voice message

## 2022-11-09 NOTE — Telephone Encounter (Signed)
Transition Care Management Unsuccessful Follow-up Telephone Call  Date of discharge and from where:  11/01/22 from [facility]  Attempts:  3rd Attempt  Reason for unsuccessful TCM follow-up call:  No answer/busy

## 2022-11-15 ENCOUNTER — Ambulatory Visit: Payer: BC Managed Care – PPO | Admitting: Medical-Surgical

## 2022-11-15 ENCOUNTER — Encounter: Payer: Self-pay | Admitting: Medical-Surgical

## 2022-11-15 ENCOUNTER — Other Ambulatory Visit: Payer: Self-pay | Admitting: Medical-Surgical

## 2022-11-15 ENCOUNTER — Telehealth: Payer: Self-pay | Admitting: Internal Medicine

## 2022-11-15 ENCOUNTER — Other Ambulatory Visit: Payer: Self-pay

## 2022-11-15 VITALS — BP 144/81 | HR 82 | Resp 20 | Ht 60.0 in | Wt 194.8 lb

## 2022-11-15 DIAGNOSIS — E785 Hyperlipidemia, unspecified: Secondary | ICD-10-CM

## 2022-11-15 DIAGNOSIS — E1165 Type 2 diabetes mellitus with hyperglycemia: Secondary | ICD-10-CM

## 2022-11-15 DIAGNOSIS — I10 Essential (primary) hypertension: Secondary | ICD-10-CM | POA: Diagnosis not present

## 2022-11-15 DIAGNOSIS — F4310 Post-traumatic stress disorder, unspecified: Secondary | ICD-10-CM

## 2022-11-15 DIAGNOSIS — F418 Other specified anxiety disorders: Secondary | ICD-10-CM

## 2022-11-15 LAB — POCT GLYCOSYLATED HEMOGLOBIN (HGB A1C): Hemoglobin A1C: 9.5 % — AB (ref 4.0–5.6)

## 2022-11-15 MED ORDER — SEMAGLUTIDE (1 MG/DOSE) 4 MG/3ML ~~LOC~~ SOPN: 1.0000 mg | PEN_INJECTOR | SUBCUTANEOUS | 3 refills | Status: DC

## 2022-11-15 MED ORDER — OZEMPIC (0.25 OR 0.5 MG/DOSE) 2 MG/3ML ~~LOC~~ SOPN: 0.5000 mg | PEN_INJECTOR | SUBCUTANEOUS | 0 refills | Status: DC

## 2022-11-15 MED ORDER — FREESTYLE LIBRE 3 SENSOR MISC: 1.0000 | 5 refills | Status: DC

## 2022-11-15 NOTE — Progress Notes (Signed)
Established Patient Office Visit  Subjective   Patient ID: Sarah Kane, female   DOB: [birth year] Age: 52 y.o. MRN: [id number]   Chief Complaint  Patient presents with   Follow-up   Diabetes   Hypertension   HPI Pleasant 52 year old female presenting today for the following:  Diabetes: Taking Amaryl 8 mg daily, Januvia 100 mg daily, Toujeo 32 units nightly, and Mounjaro 2.5 mg weekly.  Reports that she has been checking her sugar intermittently and it was 183 last night when she woke up to go to work.  Notes that the [facility] is well-tolerated however it is financially not feasible to continue since it cost 90 bucks per month.  She is interested in switching back to Ozempic.  When she was on Ozempic previously, notes her hemoglobin A1c was at 7%.  Hypertension: Taking Toprol-XL 25 mg twice daily and Imdur 45 mg daily.  She is not checking blood pressures at home although she does have a cuff available.  No regular intentional exercise and states that her abnormal schedule makes it difficult to figure out how to work and increased activity.  Tends to be more sedentary when she is at home because she is afraid of waking her son up when he has to go to work that evening.  Hyperlipidemia: Taking Lipitor 80 mg daily, tolerating well without side effects.  Mood: Not currently taking any medication for anxiety or depression but does take prazosin for nightmares.  Reports that her last couple of weeks have been very rough.  Her son got sick and she had a couple of different emergency room visits.  One of her visits worse for chest pain and the workup was reassuring.  Her second visit was for a muscle strain where her workup was also very reassuring.  Prior to the muscle strain, she had an episode of syncope while she was in the shower.  She fell and noted hitting her head.  This was fully evaluated at her ED visit with no concerning findings and she has had no further episodes.     Objective:     Vitals:   11/15/22 0857 11/15/22 0925  BP: (!) 143/85 (!) 144/81  Pulse: 83 82  Resp: 20   Height: 5' (1.524 m)   Weight: 194 lb 12.8 oz (88.4 kg)   SpO2: 99% 98%  BMI (Calculated): 38.04    Physical Exam Vitals reviewed.  Constitutional:      General: She is not in acute distress.    Appearance: Normal appearance. She is not ill-appearing.  HENT:     Head: Normocephalic and atraumatic.  Cardiovascular:     Rate and Rhythm: Normal rate and regular rhythm.     Pulses: Normal pulses.     Heart sounds: Normal heart sounds.  Pulmonary:     Effort: Pulmonary effort is normal. No respiratory distress.     Breath sounds: Normal breath sounds. No wheezing, rhonchi or rales.  Skin:    General: Skin is warm and dry.  Neurological:     Mental Status: She is alert and oriented to person, place, and time.  Psychiatric:        Mood and Affect: Mood normal.        Behavior: Behavior normal.        Thought Content: Thought content normal.        Judgment: Judgment normal.    Results for orders placed or performed in visit on 11/15/22 (from the past 24 hour(s))  POCT HgB A1C     Status: Abnormal   Collection Time: 11/15/22  9:06 AM  Result Value Ref Range   Hemoglobin A1C 9.5 (A) 4.0 - 5.6 %   HbA1c POC (<> result, manual entry)     HbA1c, POC (prediabetic range)     HbA1c, POC (controlled diabetic range)         The ASCVD Risk score (Arnett DK, et al., 2019) failed to calculate for the following reasons:   The patient has a prior MI or stroke diagnosis   Assessment & Plan:   1. Type 2 diabetes mellitus with hyperglycemia, without long-term current use of insulin (HCC) POCT hemoglobin A1c was previously 10.7% but has come down to 9.5%.  Discontinue Mounjaro due to financial issues.  Start semaglutide 0.5 mg weekly x 4 weeks.  Sample pen provided.  Sending semaglutide 1 mg weekly to the pharmacy to start after she finishes the lower dose.  Continue Januvia 100 mg daily, Amaryl 8  mg daily, and Toujeo 32 units nightly.  Advised to continue tapering the Toujeo by 2 units every 3-4 days up/down depending on her fasting sugars.  Our goal should be 130 or less. - POCT HgB A1C - Continuous Blood Gluc Sensor (FREESTYLE LIBRE 3 SENSOR) MISC; 1 Device by Does not apply route every 14 (fourteen) days. Place 1 sensor on the skin every 14 days. Use to check glucose continuously  Dispense: 2 each; Refill: 5 - Semaglutide,0.25 or 0.5MG/DOS, (OZEMPIC, 0.25 OR 0.5 MG/DOSE,) 2 MG/3ML SOPN; Inject 0.5 mg into the skin once a week.  Dispense: 3 mL; Refill: 0 - Semaglutide, 1 MG/DOSE, 4 MG/3ML SOPN; Inject 1 mg as directed once a week.  Dispense: 9 mL; Refill: 3  2. Essential hypertension Blood pressure slightly elevated today.  This is further managed by cardiology but would recommend continuing Toprol XL 25 mg twice daily and Imdur 45 mg daily.  Recommend monitoring blood pressure at home with a goal of 130/80 or less.  Discussed the recommendations for a low-sodium diet.  Reviewed physical activity recommendations however she is focused on barriers to her activity rather than finding options that are doable.  Strongly advised working to incorporate small changes and take "baby steps" to change her daily routine so that she is able to fit exercise in on a regular basis.  3. Hyperlipidemia LDL goal <70 Continue Lipitor 80 mg daily.  4. Anxiety with depression Not currently on any medications however would likely benefit from the addition of an SSRI.  She is under quite a bit of stress however I feel that making several life changes as noted above would be very beneficial for her mental health and wellness.  Plan to review medication to help with mood more in-depth at our next appointment.  5. PTSD (post-traumatic stress disorder) Continue prazosin 2 mg nightly.  Return in about 3 months (around 02/13/2023) for DM/HTN/HLD follow up.  ___________________________________________ Clearnce Sorrel,  DNP, APRN, FNP-BC Primary Care and [place]

## 2022-11-15 NOTE — Telephone Encounter (Signed)
*  STAT* If patient is at the pharmacy, call can be transferred to refill team.   1. Which medications need to be refilled? (please list name of each medication and dose if known)   ticagrelor (BRILINTA) 90 MG TABS tablet    2. Which pharmacy/location (including street and city if local pharmacy) is medication to be sent to? [place]   3. Do they need a 30 day or 90 day supply? 90   Patient has appt 6/27

## 2022-11-15 NOTE — Telephone Encounter (Signed)
I called the patient to notify her that her Brilinta refill has been sent to [facility] in [place].  I was unable to leave a message the number just kept ringing so I also sent a mychart message.

## 2022-12-16 ENCOUNTER — Other Ambulatory Visit: Payer: Self-pay | Admitting: Family Medicine

## 2023-01-18 ENCOUNTER — Other Ambulatory Visit: Payer: Self-pay | Admitting: Medical-Surgical

## 2023-01-18 DIAGNOSIS — E1165 Type 2 diabetes mellitus with hyperglycemia: Secondary | ICD-10-CM

## 2023-01-19 ENCOUNTER — Telehealth: Payer: Self-pay | Admitting: Pharmacist

## 2023-01-19 NOTE — Progress Notes (Signed)
Care Coordination  Outreached via mychart to get patient back on schedule for phone appointment. Will schedule and attempt for 01/22/23 at 9am.  Lynnda Shields, PharmD, BCPS Clinical Pharmacist [facility] Primary Care

## 2023-01-22 ENCOUNTER — Other Ambulatory Visit: Payer: BC Managed Care – PPO | Admitting: Pharmacist

## 2023-01-22 NOTE — Progress Notes (Signed)
01/22/2023 Name: Sarah Kane MRN: [id number] DOB: Nov 10, 1970  Chief Complaint  Patient presents with   Diabetes   Medication Assistance    Sarah Kane is a 52 y.o. year old female who presented for a telephone visit.   They were referred to the pharmacist by their PCP for assistance in managing diabetes and medication access.    Subjective:  Care Team: Primary Care Provider: Christen Butter, NP ; Next Scheduled Visit: 02/13/23  Medication Access/Adherence  Current Pharmacy:  [id number] - [place], [place] - [address] [place] [address] Phone: [phone] Fax: [phone]  [facility] [address] [place] [address] Phone: [phone] Fax: [phone]  [id number] - [place], [place] - [address] AT [facility] [address] [place] [address] Phone: [phone] Fax: [phone]  [facility] [place] [facility] [address] [place] [address] Phone: [phone] Fax: [phone]  [facility] [place] - [address] AT [facility] mail services [address] Mail Order pharmacy [place] [address] Phone: [phone] Fax: [phone]   Patient reports affordability concerns with their medications: Yes  Patient reports access/transportation concerns to their pharmacy: Yes , related to third shift and pharmacy not open until 2 hours after she needs to go home and sleep Patient reports adherence concerns with their medications:  Yes  cost related   Diabetes:  Current medications: toujeo 28-32 units daily, off ozempic x1 week (0.25mg  weekly)  Medications tried in the past:   Current glucose readings: 130-140 when fasting Using traditional meter; testing 1 times daily   Patient denies hypoglycemic s/sx including dizziness, shakiness, sweating. Patient  denies hyperglycemic symptoms including polyuria, polydipsia, polyphagia, nocturia, neuropathy, blurred vision.   Current medication access support: "my agile life" program   Objective:  Lab Results  Component Value Date   HGBA1C 9.5 (A) 11/15/2022    Lab Results  Component Value Date   CREATININE 1.02 (H) 11/01/2022   BUN 19 11/01/2022   NA 134 (L) 11/01/2022   K 3.6 11/01/2022   CL 100 11/01/2022   CO2 26 11/01/2022    Lab Results  Component Value Date   CHOL 154 01/02/2022   HDL 56 01/02/2022   LDLCALC 69 01/02/2022   TRIG 236 (H) 01/02/2022   CHOLHDL 2.8 01/02/2022    Medications Reviewed Today     Reviewed by Latanya Presser, CMA (Certified Medical Assistant) on 11/15/22 at 0901  Med List Status: <None>   Medication Order Taking? Sig Documenting Provider Last Dose Status Informant  acetaminophen (TYLENOL) 500 MG tablet [id number] Yes Take 1,000 mg by mouth every 6 (six) hours as needed for mild pain or headache.  [provider] Taking Active Self  aspirin 81 MG EC tablet [id number] Yes Take 1 tablet (81 mg total) by mouth daily. Swallow whole. Pricilla Riffle, MD Taking Active Self  atorvastatin (LIPITOR) 80 MG tablet [id number] Yes Take 1 tablet by mouth once daily Pricilla Riffle, MD Taking Active   gabapentin (NEURONTIN) 300 MG capsule [id number] Yes Take 1 capsule (300 mg total) by mouth 3 (three) times daily. Christen Butter, NP Taking Active   glimepiride (AMARYL) 4 MG tablet [id number] Yes TAKE 2 TABLETS BY MOUTH ONCE DAILY WITH BREAKFAST. Christen Butter, NP Taking Active   insulin glargine,  2 Unit Dial, (TOUJEO MAX SOLOSTAR) 300 UNIT/ML Solostar Pen [id number] Yes Inject 14 Units into the skin daily. Increase Toujeo to 14 units daily. Check sugars every 3 days. If fasting reading is greater than 130, increase the Toujeo dose by 2 units. If sugar is 90 or less, decrease Toujeo by 2 units. Max Toujeo dose 40 units daily. Christen Butter, NP Taking Active   Insulin Pen  Needle 31G X 5 MM MISC [id number] Yes Use one needle with Toujeo insulin pen to inject insulin once daily. Christen Butter, NP Taking Active   isosorbide mononitrate (IMDUR) 30 MG 24 hr tablet [id number] Yes TAKE 1 & 1/2 (ONE & ONE-HALF) TABLETS BY MOUTH ONCE DAILY Pricilla Riffle, MD Taking Active   metoprolol succinate (TOPROL-XL) 25 MG 24 hr tablet [id number] Yes Take 1 tablet (25 mg total) by mouth 2 (two) times daily with a meal. Christen Butter, NP Taking Active   MOUNJARO 2.5 MG/0.5ML Pen [id number] Yes INJECT 1/2 (ONE-HALF) ML (2.5MG ) INTO THE SKIN  ONCE A WEEK Jessup, Joy, NP Taking Active   prazosin (MINIPRESS) 2 MG capsule [id number] Yes Take 1 capsule (2 mg total) by mouth at bedtime. Christen Butter, NP Taking Active   sitaGLIPtin (JANUVIA) 100 MG tablet [id number] Yes Take 1 tablet (100 mg total) by mouth daily. Christen Butter, NP Taking Active   ticagrelor (BRILINTA) 90 MG TABS tablet [id number] Yes Take 1 tablet (90 mg total) by mouth 2 (two) times daily. Christen Butter, NP Taking Active               Assessment/Plan:   Diabetes: - Currently uncontrolled - Recommend to continue current regimen, arranged for sample ozempic to be picked up and instructed patient to resume at 0.5mg  weekly  - Recommend to check glucose 1-2 times daily   Follow Up Plan: 2-4 weeks  Lynnda Shields, PharmD, BCPS Clinical Pharmacist [facility] Primary Care

## 2023-01-22 NOTE — Patient Instructions (Addendum)
Talonda,   Thank you for taking my call today! As discussed, pick up a sample box of ozempic at Joy's office and resume at 0.5mg  weekly. We will figure out the insurance issues.  Here is the information for medicaid:  Apply by phone at [phone] (hours 7am to 7pm M-F, and 7am to 5pm Saturday).  Or online at: [email or web address]  I will call you in a few weeks to see how things are going.  Take care, Elmarie Shiley, PharmD, BCPS Clinical Pharmacist [facility] Primary Care

## 2023-01-23 ENCOUNTER — Telehealth: Payer: Self-pay

## 2023-01-23 ENCOUNTER — Other Ambulatory Visit: Payer: Self-pay | Admitting: Internal Medicine

## 2023-01-23 NOTE — Telephone Encounter (Signed)
..   Medicaid Managed Care   Unsuccessful Outreach Note  01/23/2023 Name: Sarah Kane MRN: [id number] DOB: 05-24-1971  Referred by: Christen Butter, NP Reason for referral : Appointment   An unsuccessful telephone outreach was attempted today. The patient was referred to the case management team for assistance with care management and care coordination.   Follow Up Plan: A HIPAA compliant phone message was left for the patient providing contact information and requesting a return call.  The care management team will reach out to the patient again over the next 7 days.   Weston Settle Care Guide  [facility] Managed  [facility]  [phone]

## 2023-01-24 ENCOUNTER — Telehealth: Payer: Self-pay

## 2023-01-24 NOTE — Telephone Encounter (Signed)
..   Medicaid Managed Care   Unsuccessful Outreach Note  01/24/2023 Name: Sarah Kane MRN: [id number] DOB: 11-Mar-1971  Referred by: Christen Butter, NP Reason for referral : Appointment   A second unsuccessful telephone outreach was attempted today. The patient was referred to the case management team for assistance with care management and care coordination.   Follow Up Plan: A HIPAA compliant phone message was left for the patient providing contact information and requesting a return call.  The care management team will reach out to the patient again over the next 7 days.    Weston Settle Care Guide  [facility] Managed  Care Guide [facility]  [phone]

## 2023-01-30 ENCOUNTER — Telehealth: Payer: Self-pay

## 2023-01-30 NOTE — Telephone Encounter (Signed)
..   Medicaid Managed Care   Unsuccessful Outreach Note  01/30/2023 Name: Sarah Kane MRN: [id number] DOB: April 16, 1971  Referred by: Christen Butter, NP Reason for referral : Appointment   Third unsuccessful telephone outreach was attempted today. The patient was referred to the case management team for assistance with care management and care coordination. The patient's primary care provider has been notified of our unsuccessful attempts to make or maintain contact with the patient. The care management team is pleased to engage with this patient at any time in the future should he/she be interested in assistance from the care management team.   Follow Up Plan: We have been unable to make contact with the patient for follow up. The care management team is available to follow up with the patient after provider conversation with the patient regarding recommendation for care management engagement and subsequent re-referral to the care management team.   Weston Settle Care Guide  [facility] Managed  Care Guide [facility]  [phone]

## 2023-02-01 ENCOUNTER — Other Ambulatory Visit: Payer: Self-pay | Admitting: Medical-Surgical

## 2023-02-05 ENCOUNTER — Telehealth: Payer: Self-pay

## 2023-02-05 NOTE — Telephone Encounter (Signed)
..   Medicaid Managed Care   Unsuccessful Outreach Note  02/05/2023 Name: Sarah Kane MRN: [id number] DOB: February 25, 1971  Referred by: Christen Butter, NP Reason for referral : Appointment     I made one last attempt to reach her early in the morning since she works at night but I was not able to reach her.   Follow Up Plan: We have been unable to make contact with the patient for follow up. The care management team is available to follow up with the patient after provider conversation with the patient regarding recommendation for care management engagement and subsequent re-referral to the care management team.  I gave my direct number to Lynnda Shields who will give it to the patient at their next appointment.  Weston Settle Care Guide  [facility] Managed  Care Guide [facility]  [phone]

## 2023-02-09 ENCOUNTER — Telehealth: Payer: Self-pay | Admitting: Medical-Surgical

## 2023-02-09 ENCOUNTER — Other Ambulatory Visit: Payer: Self-pay | Admitting: Medical-Surgical

## 2023-02-09 MED ORDER — SAXAGLIPTIN HCL 5 MG PO TABS: 5.0000 mg | ORAL_TABLET | Freq: Every day | ORAL | 1 refills | Status: DC

## 2023-02-09 NOTE — Progress Notes (Signed)
Discontinue Januvia.  Start saxagliptin 5 mg daily.  New prescription sent to pharmacy. ___________________________________________ [name], DNP, APRN, FNP-BC Primary Care and Sports Medicine [facility] [place]

## 2023-02-09 NOTE — Telephone Encounter (Signed)
New prescription sent to the pharmacy on file.  Discontinue Januvia.  Start saxagliptin 5 mg daily.

## 2023-02-09 NOTE — Telephone Encounter (Signed)
Tonya from [employer] called she is requesting a medication change from [place] 100mg   To Saxaglitpin because of price please contact  [phone] Ref [phone]

## 2023-02-11 ENCOUNTER — Other Ambulatory Visit: Payer: Self-pay | Admitting: Medical-Surgical

## 2023-02-12 NOTE — Progress Notes (Signed)
Left detailed message and requested a return call.

## 2023-02-13 ENCOUNTER — Encounter: Payer: Self-pay | Admitting: Medical-Surgical

## 2023-02-13 ENCOUNTER — Ambulatory Visit (INDEPENDENT_AMBULATORY_CARE_PROVIDER_SITE_OTHER): Payer: BC Managed Care – PPO | Admitting: Medical-Surgical

## 2023-02-13 VITALS — BP 132/86 | HR 82 | Temp 98.2°F | Ht 60.0 in | Wt 192.5 lb

## 2023-02-13 DIAGNOSIS — Z794 Long term (current) use of insulin: Secondary | ICD-10-CM

## 2023-02-13 DIAGNOSIS — F4541 Pain disorder exclusively related to psychological factors: Secondary | ICD-10-CM

## 2023-02-13 DIAGNOSIS — I10 Essential (primary) hypertension: Secondary | ICD-10-CM

## 2023-02-13 DIAGNOSIS — E1165 Type 2 diabetes mellitus with hyperglycemia: Secondary | ICD-10-CM

## 2023-02-13 DIAGNOSIS — F4310 Post-traumatic stress disorder, unspecified: Secondary | ICD-10-CM

## 2023-02-13 DIAGNOSIS — F418 Other specified anxiety disorders: Secondary | ICD-10-CM

## 2023-02-13 DIAGNOSIS — F515 Nightmare disorder: Secondary | ICD-10-CM

## 2023-02-13 LAB — POCT UA - MICROALBUMIN
Creatinine, POC: 10 mg/dL
Microalbumin Ur, POC: 10 mg/L

## 2023-02-13 LAB — POCT GLYCOSYLATED HEMOGLOBIN (HGB A1C): Hemoglobin A1C: 8.9 % — AB (ref 4.0–5.6)

## 2023-02-13 MED ORDER — DULOXETINE HCL 30 MG PO CPEP: ORAL_CAPSULE | ORAL | 1 refills | Status: DC

## 2023-02-13 NOTE — Patient Instructions (Signed)
Crisis control pharmacy in [facility] pharmacy in [facility] [place] [facility]

## 2023-02-13 NOTE — Progress Notes (Signed)
        Established patient visit  History, exam, impression, and plan:  1. Type 2 diabetes mellitus with hyperglycemia, without long-term current use of insulin [facility]) Very pleasant 52 year old female presenting today to follow-up on diabetes.  Her last hemoglobin A1c in February was 9.5%.  Has been using Ozempic, Toujeo, saxagliptin, and glimepiride as prescribed.  Admits that she continues to have financial difficulties that make affording Ozempic if possible.  Was able to use what was left as sample but has been unable to afford this to get it refilled.  POCT hemoglobin A1c today 8.9% showing some improvement however there is concern for worsening if she is unable to afford her medications.  POCT microalbumin abnormal in the setting of uncontrolled diabetes.  Discussed recommendations for looking into low-cost pharmacies.  A list of these were placed on her AVS for her to look into and she will let me know if she would like her medications sent to a different place. - POCT HgB A1C - POCT UA - Microalbumin  2. Psychosomatic pain Having widespread body aches and pains.  Some of this seems to be related to chronic pain however also considered contribution from osteoarthritis as well as mental health concerns.  After much discussion, feel that she would benefit from a trial of Cymbalta as gabapentin is not managing alone.  Starting Cymbalta 30 mg daily at bedtime with increase to 60 mg daily after 1 week if well-tolerated.  3. Essential hypertension Currently taking Imdur, prazosin, and Toprol-XL.  Tolerating well without side effects. Denies CP, SOB, palpitations, lower extremity edema, dizziness, headaches, or vision changes.  Blood pressure at goal today.  Continue current medications.  4. Anxiety with depression 5. Nightmares 6. PTSD (post-traumatic stress disorder) Continues to have significant struggles with mental health.  Notes that over the last couple days that she has had several  episodes where she just wanted to die.  Has multiple issues that are contributing to her mental health concerns and feels overwhelmed all the time now.  Not currently on medication to help manage with this.  She has been taking prazosin to see if this would help with nightmares but does not seem to have any benefit.  Having difficulty sleeping and knows this only exacerbates her mental health symptoms.  After much discussion, we are starting Cymbalta 30 mg daily x 1 week then up to 60 mg daily thereafter.  Advised to notify us immediately should she have any worsening of symptoms or thoughts/plans for self-harm.  She denies any current plans in place for self-harm or suicide.  Contracted for safety today.  Plan close follow-up to evaluate medication tolerance and effectiveness.  Procedures performed this visit: None.  Return in about 4 weeks (around 03/13/2023) for mood follow up.  __________________________________ [name], DNP, APRN, FNP-BC Primary Care and Sports Medicine [facility] [place]

## 2023-02-13 NOTE — Progress Notes (Signed)
Task completed. Patient was seen for an appointment earlier today with provider.

## 2023-03-13 ENCOUNTER — Encounter: Payer: Self-pay | Admitting: Medical-Surgical

## 2023-03-13 ENCOUNTER — Telehealth: Payer: Self-pay

## 2023-03-13 ENCOUNTER — Ambulatory Visit (INDEPENDENT_AMBULATORY_CARE_PROVIDER_SITE_OTHER): Payer: BC Managed Care – PPO | Admitting: Medical-Surgical

## 2023-03-13 VITALS — BP 105/72 | HR 77 | Resp 20 | Ht 60.0 in | Wt 192.1 lb

## 2023-03-13 DIAGNOSIS — E1165 Type 2 diabetes mellitus with hyperglycemia: Secondary | ICD-10-CM

## 2023-03-13 DIAGNOSIS — F418 Other specified anxiety disorders: Secondary | ICD-10-CM | POA: Diagnosis not present

## 2023-03-13 DIAGNOSIS — F4310 Post-traumatic stress disorder, unspecified: Secondary | ICD-10-CM

## 2023-03-13 DIAGNOSIS — F515 Nightmare disorder: Secondary | ICD-10-CM

## 2023-03-13 DIAGNOSIS — Z794 Long term (current) use of insulin: Secondary | ICD-10-CM

## 2023-03-13 MED ORDER — PRAZOSIN HCL 1 MG PO CAPS: 1.0000 mg | ORAL_CAPSULE | Freq: Every day | ORAL | 1 refills | Status: DC

## 2023-03-13 NOTE — Patient Instructions (Signed)
Take Cymbalta when waking up  Take Minipress 2mg  capsule plus 1mg  capsule for a total of 3mg  nightly

## 2023-03-13 NOTE — Progress Notes (Signed)
   03/13/2023  Patient ID: Sarah Kane, female   DOB: [birth year], 52 y.o.   MRN: [id number]  Patient outreach to schedule telephone visit to review medications and assist with cost of diabetes medication(s) in response to message received by PCP, Christen Butter.  Patient was seen in the past by Lynnda Shields, Pharmd whom I am covering for at the moment.  Left message with my direct phone number for her to call me back at her convenience.  Lenna Gilford, PharmD, DPLA

## 2023-03-13 NOTE — Progress Notes (Signed)
        Established patient visit  History, exam, impression, and plan:  1. Anxiety with depression Pleasant 52 year old female presenting today for follow-up after 4 weeks of taking Cymbalta.  Originally took 1 week of 30 mg once daily but has increased this to 60 mg once daily as instructed.  Tolerating the medication well but notes that she is having difficulty getting sleep.  She does work third shift and usually sleeps during the day but seems to be having significant trouble since starting the medication.  Has been taking it in the morning before she goes to bed but has not tried taking the medication at other times of the day.  Does not feel that the medication is working extremely well and has not really noticed a difference in her overall mood.  Reports that she feels that she has been depressed since birth and does not necessarily need a medication to treat her mental health.  Not currently doing counseling.  Denies SI/HI.  Since she is only been at the 60 mg dose for about 3 weeks, would like to give it a few more weeks.  Advised to try taking this when she wakes up to go to work at night rather than just before bed to see if this still interferes with her ability to sleep.  Patient agreeable to give it a few more weeks and let me know.  2. Nightmares 3. PTSD (post-traumatic stress disorder) Has been taking Minipress 2 mg before bed which seems to help with blood pressure but she has not noticed much of a benefit as far as the nightmares/PTSD.  Interested in trying a bit of a higher dose.  Increasing Minipress to 3 mg before bed with instructions to monitor blood pressure closely.  4.  Type 2 diabetes with hyperglycemia Reports that she received a medication from the pharmacy called saxagliptin however was unfamiliar with this medication and has not been taking it.  Has Januvia at home that she has been taking which seems to work well for her.  I did advise that this was likely switched out  because insurance coverage and formulary changes no longer cover Januvia.  Would like her to continue Januvia until her supply has been completed then switch to saxagliptin.  Also notes that she was unable to get Ozempic from the pharmacy as the coupon card was not working.  Is still interested in getting back on this so while she was at the appointment today, I signed her out for a new coupon card and printed this out for her to take to her pharmacy.  If she continues to have issues getting this, we may need to initiate patient assistance forms or consider reaching out to our drug rep to help with any pharmacy complications that may be happening.  Procedures performed this visit: None.  Return in about 4 weeks (around 04/10/2023) for mood/DM follow up.  __________________________________ [name], DNP, APRN, FNP-BC Primary Care and Sports Medicine [facility]

## 2023-03-20 NOTE — Progress Notes (Unsigned)
Cardiology Office Note   Date:  03/22/2023   ID:  Sarah Kane, DOB 03-Sep-1971, MRN [id number]  PCP:  Christen Butter, NP  Cardiologist: Dr. Dietrich Pates  Pt presents for f/u of CAD     History of Present Illness: Sarah Kane is a 52 y.o. female who has a hx of HTN, DM2 and CAD. She had an NSTEMI 04/2020 at which time she underwent LHC that showed diffuse CAD with 30% pLAD, 99% dLAD, 70% prox LCx, 100% mRCA, 70% dRCA. She underwent PCI/DES to RCA with resolution of symptoms. Per chart review, staged PCI would be considered in the LCx if refractory symptoms.    After d/c she continued to have angina    She ultimately underwent LHC on 07/22/2020 which showed a proximal LCx lesion at 70% at which time DES/PCI was placed with recommendations for DAPT with ASA and Brilinta for 1 year.  I saw the pt in clinic 1 year ago   Since seen  she denies CP    She says she had an anxiety attack and was hospitalized She had a syncopal spell on 10/30/22  Says she didn't feel good   Got up  Was groggy  took shower  Continued to feel bad  Got out and passed out     Since then has felt OK  No furhter spells    Breathing is OK   NO CP  No dizziness  No palpitations  Diet    Admits to eating doritos, diet sodas   Does not like veggies Interested in cooking class and also exercise class   Money is a problem  Past Medical History:  Diagnosis Date   Arthritis    Coronary artery disease    COVID-19 05/2020   Diabetes mellitus without complication (HCC)    Diabetic retinopathy (HCC)    Hyperlipidemia    Hypertension    Hypertensive retinopathy    Myocardial infarct, old     Past Surgical History:  Procedure Laterality Date   CARDIAC CATHETERIZATION     CESAREAN SECTION  1990   CORONARY STENT INTERVENTION N/A 05/20/2020   Procedure: CORONARY STENT INTERVENTION;  Surgeon: Kathleene Hazel, MD;  Location: [facility];  Service: Cardiovascular;  Laterality: N/A;   CORONARY STENT INTERVENTION N/A  07/22/2020   Procedure: CORONARY STENT INTERVENTION;  Surgeon: Kathleene Hazel, MD;  Location: [facility];  Service: Cardiovascular;  Laterality: N/A;   LEFT HEART CATH AND CORONARY ANGIOGRAPHY N/A 05/20/2020   Procedure: LEFT HEART CATH AND CORONARY ANGIOGRAPHY;  Surgeon: Kathleene Hazel, MD;  Location: [facility];  Service: Cardiovascular;  Laterality: N/A;   TRIGGER FINGER RELEASE Right    thumb     Current Outpatient Medications  Medication Sig Dispense Refill   acetaminophen (TYLENOL) 500 MG tablet Take 1,000 mg by mouth every 6 (six) hours as needed for mild pain or headache.      atorvastatin (LIPITOR) 80 MG tablet Take 1 tablet by mouth once daily 90 tablet 2   clopidogrel (PLAVIX) 75 MG tablet Take 1 tablet (75 mg total) by mouth daily. 90 tablet 3   Continuous Blood Gluc Sensor (FREESTYLE LIBRE 3 SENSOR) MISC 1 Device by Does not apply route every 14 (fourteen) days. Place 1 sensor on the skin every 14 days. Use to check glucose continuously 2 each 5   DULoxetine (CYMBALTA) 30 MG capsule Take 1 capsule at bedtime daily for 1 week then  increase to 2 capsules at bedtime daily. 60 capsule 1   gabapentin (NEURONTIN) 300 MG capsule TAKE 1 CAPSULE BY MOUTH THREE TIMES DAILY 180 capsule 0   glimepiride (AMARYL) 4 MG tablet TAKE 2 TABLETS BY MOUTH ONCE DAILY WITH BREAKFAST 180 tablet 0   insulin glargine, 2 Unit Dial, (TOUJEO MAX SOLOSTAR) 300 UNIT/ML Solostar Pen Inject 14 Units into the skin daily. Increase Toujeo to 14 units daily. Check sugars every 3 days. If fasting reading is greater than 130, increase the Toujeo dose by 2 units. If sugar is 90 or less, decrease Toujeo by 2 units. Max Toujeo dose 40 units daily. 30 mL 1   Insulin Pen Needle 31G X 5 MM MISC Use one needle with Toujeo insulin pen to inject insulin once daily. 100 each 0   isosorbide mononitrate (IMDUR) 30 MG 24 hr tablet TAKE 1&1/2 TABLET BY MOUTH ONCE DAILY 135 tablet 0   prazosin  (MINIPRESS) 1 MG capsule Take 1 capsule (1 mg total) by mouth at bedtime. Take with 2mg  cap to equal 3mg  nightly. 30 capsule 1   prazosin (MINIPRESS) 2 MG capsule Take 1 capsule by mouth at bedtime 90 capsule 0   saxagliptin HCl (ONGLYZA) 5 MG TABS tablet Take 1 tablet (5 mg total) by mouth daily. 90 tablet 1   Semaglutide, 1 MG/DOSE, 4 MG/3ML SOPN Inject 1 mg as directed once a week. 9 mL 3   Semaglutide,0.25 or 0.5MG /DOS, (OZEMPIC, 0.25 OR 0.5 MG/DOSE,) 2 MG/3ML SOPN Inject 0.5 mg into the skin once a week. 3 mL 0   metoprolol succinate (TOPROL-XL) 25 MG 24 hr tablet Take 1 tablet (25 mg total) by mouth daily. 180 tablet 2   No current facility-administered medications for this visit.    Allergies:   Other and Nitroglycerin    Social History:  The patient  reports that she has never smoked. She has never used smokeless tobacco. She reports that she does not drink alcohol and does not use drugs.   Family History:  The patient's family history includes Diabetes in her maternal aunt, maternal grandfather, maternal grandmother, maternal uncle, and mother; Heart attack in her maternal grandfather; High blood pressure in her maternal grandfather, maternal grandmother, mother, paternal grandfather, and paternal grandmother; Skin cancer in her maternal uncle.    ROS:  Please see the history of present illness. Otherwise, review of systems are positive for none.   All other systems are reviewed and negative.    PHYSICAL EXAM: VS:  BP 98/68   Pulse 87   Ht 5' (1.524 m)   Wt 190 lb 6.4 oz (86.4 kg)   SpO2 98%   BMI 37.18 kg/m  , BMI Body mass index is 37.18 kg/m.   General: Morbidly obese 52 yo in NAD  Neck: No JVD Lungs:Clear to auscultation Cardiovascular: RRR with S1 S2. No murmurs  No LE edema    Extremities: No edema. Radial pulses 2+ bilaterally  EKG:  EKG not ordered today.   Recent Labs: 11/01/2022: ALT 12; BUN 19; Creatinine, Ser 1.02; Hemoglobin 15.6; Platelets 219; Potassium  3.6; Sodium 134    Lipid Panel    Component Value Date/Time   CHOL 154 01/02/2022 0000   CHOL 135 06/18/2020 1226   TRIG 236 (H) 01/02/2022 0000   HDL 56 01/02/2022 0000   HDL 49 06/18/2020 1226   CHOLHDL 2.8 01/02/2022 0000   VLDL 30 05/21/2020 0438   LDLCALC 69 01/02/2022 0000   Wt Readings from Last 3  Encounters:  03/22/23 190 lb 6.4 oz (86.4 kg)  03/13/23 192 lb 1.6 oz (87.1 kg)  02/13/23 192 lb 8 oz (87.3 kg)    Other studies Reviewed: Additional studies/ records that were reviewed today include:  Review of the above records demonstrates:   Echo 07/01/21    Cardiac Catheterization 07/22/2020:   Prox Cx lesion is 70% stenosed. A drug-eluting stent was successfully placed using a SYNERGY XD 3.0X16. Post intervention, there is a 0% residual stenosis.   1. Severe stenosis proximal Circumflex artery 2. Successful PTCA/DES x 1 proximal Circumflex   Recommendations: Continue DAPT with ASA and Brilinta for one year.    Diagnostic Dominance: Right  Intervention       ASSESSMENT AND PLAN:  1  Syncope   Episode sounds like spell was due to orthostasis/hypotension  Follow  Stay hydrated   Avoid showers, extreme heat if dizzy  2  CAD     No recent CP    Will switch from ASA / Brilinta to Plavix alone      2. HLD: Will get lipomed today   3. DM2: -HgbA12C 9.5  Reviewed diet.  Will look into classes for her  Work with pharmacy to get back on Ozempic    4. HTN:  -BP is a little low  Keep hydrated   Can cut metoprolol to 1x per day  Follow      5 Lifestyle medicine  Pt is very interested in a program for eating /cooking / shopping / exercise   Unfortunately no programs at present      Follow up in Nov / Dec.  Signed, Dietrich Pates, MD  03/22/2023 2:07 PM    [facility] [address] [place], [place], [place]  [address] Phone: [phone]; Fax: [phone]

## 2023-03-21 ENCOUNTER — Telehealth: Payer: Self-pay

## 2023-03-21 NOTE — Progress Notes (Signed)
   03/21/2023  Patient ID: Sarah Kane, female   DOB: 04/14/1971, 52 y.o.   MRN: [id number]  Outreach attempt to schedule telephone visit to discuss diabetes medications with [name].  I was not able to reach her but did leave a voicemail with my direct number to call back.  I am also sending a MyChart message to request a telephone visit.  Lenna Gilford, PharmD, DPLA

## 2023-03-22 ENCOUNTER — Encounter: Payer: Self-pay | Admitting: Internal Medicine

## 2023-03-22 ENCOUNTER — Ambulatory Visit: Payer: BC Managed Care – PPO | Attending: Internal Medicine | Admitting: Internal Medicine

## 2023-03-22 VITALS — BP 98/68 | HR 87 | Ht 60.0 in | Wt 190.4 lb

## 2023-03-22 DIAGNOSIS — E785 Hyperlipidemia, unspecified: Secondary | ICD-10-CM | POA: Diagnosis not present

## 2023-03-22 DIAGNOSIS — I10 Essential (primary) hypertension: Secondary | ICD-10-CM

## 2023-03-22 MED ORDER — METOPROLOL SUCCINATE ER 25 MG PO TB24: 25.0000 mg | ORAL_TABLET | Freq: Every day | ORAL | 2 refills | Status: DC

## 2023-03-22 MED ORDER — CLOPIDOGREL BISULFATE 75 MG PO TABS: 75.0000 mg | ORAL_TABLET | Freq: Every day | ORAL | 3 refills | Status: DC

## 2023-03-22 NOTE — Patient Instructions (Signed)
Medication Instructions:  Stop aspirin. Stop brilinta. Decrease metoprolol to once daily.  *If you need a refill on your cardiac medications before your next appointment, please call your pharmacy*   Lab Work: CMET, MNR, TSH --- Today   Follow-Up: At [facility], you and your health needs are our priority.  As part of our continuing mission to provide you with exceptional heart care, we have created designated Provider Care Teams.  These Care Teams include your primary Cardiologist (physician) and Advanced Practice Providers (APPs -  Physician Assistants and Nurse Practitioners) who all work together to provide you with the care you need, when you need it.  We recommend signing up for the patient portal called "MyChart".  Sign up information is provided on this After Visit Summary.  MyChart is used to connect with patients for Virtual Visits (Telemedicine).  Patients are able to view lab/test results, encounter notes, upcoming appointments, etc.  Non-urgent messages can be sent to your provider as well.   To learn more about what you can do with MyChart, go to [email or web address].    Your next appointment:   Nov or Dec 2024  Provider:   Dietrich Pates, MD

## 2023-03-23 LAB — COMPREHENSIVE METABOLIC PANEL
ALT: 24 IU/L (ref 0–32)
AST: 24 IU/L (ref 0–40)
Albumin: 4.4 g/dL (ref 3.8–4.9)
Alkaline Phosphatase: 96 IU/L (ref 44–121)
BUN/Creatinine Ratio: 7 — ABNORMAL LOW (ref 9–23)
BUN: 7 mg/dL (ref 6–24)
Bilirubin Total: 0.6 mg/dL (ref 0.0–1.2)
CO2: 26 mmol/L (ref 20–29)
Calcium: 9.5 mg/dL (ref 8.7–10.2)
Chloride: 98 mmol/L (ref 96–106)
Creatinine, Ser: 0.97 mg/dL (ref 0.57–1.00)
Globulin, Total: 2.5 g/dL (ref 1.5–4.5)
Glucose: 150 mg/dL — ABNORMAL HIGH (ref 70–99)
Potassium: 4.4 mmol/L (ref 3.5–5.2)
Sodium: 138 mmol/L (ref 134–144)
Total Protein: 6.9 g/dL (ref 6.0–8.5)
eGFR: 70 mL/min/{1.73_m2} (ref >59)

## 2023-03-23 LAB — NMR, LIPOPROFILE
Cholesterol, Total: 117 mg/dL (ref 100–199)
HDL Particle Number: 30.3 umol/L — ABNORMAL LOW (ref >=30.5)
HDL-C: 53 mg/dL (ref >39)
LDL Particle Number: 545 nmol/L (ref <1000)
LDL Size: 20.4 nm — ABNORMAL LOW (ref >20.5)
LDL-C (NIH Calc): 43 mg/dL (ref 0–99)
LP-IR Score: 51 — ABNORMAL HIGH (ref <=45)
Small LDL Particle Number: 363 nmol/L (ref <=527)
Triglycerides: 118 mg/dL (ref 0–149)

## 2023-03-23 LAB — TSH: TSH: 1.58 u[IU]/mL (ref 0.450–4.500)

## 2023-03-30 ENCOUNTER — Telehealth: Payer: Self-pay | Admitting: Internal Medicine

## 2023-03-30 NOTE — Telephone Encounter (Signed)
Spoke with pt who has concerns as to if Plavix is working as it should.  Pt states she is taking medication as prescribed but she cut her hand a work the other night and did not bleed ash she did when she was taking Brillinta.  Pt also complains of bing very gassy with Plavix and would like to know if the is a side effect of medication. Pt advised will forward message to our pharmacy team and Dr Tenny Craw for review.  Pt verbalizes understanding and requests that we respond to her questions via MyChart message as she will be sleeping.

## 2023-03-30 NOTE — Telephone Encounter (Signed)
Flatulence is not a listed side effect to Plavix, recommend she continue taking daily.

## 2023-03-30 NOTE — Telephone Encounter (Signed)
Spoke with patient and shared Dr. Tenny Craw' response:  Continue taking Plavix    ? If it is something she ate.  She should look back to what she has eaten the past several days     Patient states she thinks it may just be her nerves, she gets "weirded out" when she changes medications or starts a new med. She appreciates Dr. Tenny Craw and her feedback and will call if she has any further questions/concerns.

## 2023-03-30 NOTE — Telephone Encounter (Signed)
Pt c/o medication issue:  1. Name of Medication:   clopidogrel (PLAVIX) 75 MG tablet    2. How are you currently taking this medication (dosage and times per day)?  Take 1 tablet (75 mg total) by mouth daily.       3. Are you having a reaction (difficulty breathing--STAT)? No  4. What is your medication issue? Pt is requesting a callback due to her recently being placed on this medication but she feels as though its not working. 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 Please advise.

## 2023-03-30 NOTE — Telephone Encounter (Signed)
Continue taking Plavix    ? If it is something she ate.  She should look back to what she has eaten the past several days

## 2023-04-10 NOTE — Progress Notes (Signed)
   Complete physical exam  Patient: Sarah Kane   DOB: 07/15/2000   52 y.o. Female  MRN: [id number]  Subjective:    No chief complaint on file.   Sarah Kane is a 52 y.o. female who presents today for a complete physical exam. She reports consuming a {diet types:17450} diet. {types:19826} She generally feels {DESC; WELL/FAIRLY WELL/POORLY:18703}. She reports sleeping {DESC; WELL/FAIRLY WELL/POORLY:18703}. She {does/does not:200015} have additional problems to discuss today.    Most recent fall risk assessment:    03/22/2022   10:42 AM  Fall Risk   Falls in the past year? 0  Number falls in past yr: 0  Injury with Fall? 0  Risk for fall due to : No Fall Risks  Follow up Falls evaluation completed     Most recent depression screenings:    03/22/2022   10:42 AM 02/10/2021   10:46 AM  PHQ 2/9 Scores  PHQ - 2 Score 0 0  PHQ- 9 Score 5     {VISON DENTAL STD PSA (Optional):27386}  {History (Optional):23778}  Patient Care Team: Jessup, Joy, NP as PCP - General (Nurse Practitioner)   Outpatient Medications Prior to Visit  Medication Sig   fluticasone (FLONASE) 50 MCG/ACT nasal spray Place 2 sprays into both nostrils in the morning and at bedtime. After 7 days, reduce to once daily.   norgestimate-ethinyl estradiol (SPRINTEC 28) 0.25-35 MG-MCG tablet Take 1 tablet by mouth daily.   Nystatin POWD Apply liberally to affected area 2 times per day   spironolactone (ALDACTONE) 100 MG tablet Take 1 tablet (100 mg total) by mouth daily.   No facility-administered medications prior to visit.    ROS        Objective:     There were no vitals taken for this visit. {Vitals History (Optional):23777}  Physical Exam   No results found for any visits on 04/27/22. {Show previous labs (optional):23779}    Assessment & Plan:    Routine Health Maintenance and Physical Exam  Immunization History  Administered Date(s) Administered   DTaP 09/28/1999, 11/24/1999,  02/02/2000, 10/18/2000, 05/03/2004   Hepatitis A 02/28/2008, 03/05/2009   Hepatitis B 07/16/1999, 08/23/1999, 02/02/2000   HiB (PRP-OMP) 09/28/1999, 11/24/1999, 02/02/2000, 10/18/2000   IPV 09/28/1999, 11/24/1999, 07/23/2000, 05/03/2004   Influenza,inj,Quad PF,6+ Mos 06/05/2014   Influenza-Unspecified 09/04/2012   MMR 07/23/2001, 05/03/2004   Meningococcal Polysaccharide 03/04/2012   Pneumococcal Conjugate-13 10/18/2000   Pneumococcal-Unspecified 02/02/2000, 04/17/2000   Tdap 03/04/2012   Varicella 07/23/2000, 02/28/2008    Health Maintenance  Topic Date Due   HIV Screening  Never done   Hepatitis C Screening  Never done   INFLUENZA VACCINE  04/25/2022   PAP-Cervical Cytology Screening  04/27/2022 (Originally 07/14/2020)   PAP SMEAR-Modifier  04/27/2022 (Originally 07/14/2020)   TETANUS/TDAP  04/27/2022 (Originally 03/04/2022)   HPV VACCINES  Discontinued   COVID-19 Vaccine  Discontinued    Discussed health benefits of physical activity, and encouraged her to engage in regular exercise appropriate for her age and condition.  Problem List Items Addressed This Visit   None Visit Diagnoses     Annual physical exam    -  Primary   Cervical cancer screening       Need for Tdap vaccination          No follow-ups on file.     Joy Jessup, NP   

## 2023-04-11 ENCOUNTER — Ambulatory Visit (INDEPENDENT_AMBULATORY_CARE_PROVIDER_SITE_OTHER): Payer: BC Managed Care – PPO | Admitting: Medical-Surgical

## 2023-04-11 ENCOUNTER — Other Ambulatory Visit: Payer: Self-pay | Admitting: Medical-Surgical

## 2023-04-11 DIAGNOSIS — F4310 Post-traumatic stress disorder, unspecified: Secondary | ICD-10-CM

## 2023-04-11 DIAGNOSIS — Z91199 Patient's noncompliance with other medical treatment and regimen due to unspecified reason: Secondary | ICD-10-CM

## 2023-04-11 DIAGNOSIS — F515 Nightmare disorder: Secondary | ICD-10-CM

## 2023-04-11 DIAGNOSIS — F418 Other specified anxiety disorders: Secondary | ICD-10-CM

## 2023-04-16 ENCOUNTER — Ambulatory Visit: Payer: BC Managed Care – PPO | Admitting: Medical-Surgical

## 2023-04-16 ENCOUNTER — Encounter: Payer: Self-pay | Admitting: Medical-Surgical

## 2023-04-16 ENCOUNTER — Other Ambulatory Visit: Payer: Self-pay | Admitting: Internal Medicine

## 2023-04-16 ENCOUNTER — Other Ambulatory Visit: Payer: Self-pay | Admitting: Medical-Surgical

## 2023-04-16 VITALS — BP 119/81 | HR 81 | Resp 20 | Ht 60.0 in | Wt 188.0 lb

## 2023-04-16 DIAGNOSIS — E1165 Type 2 diabetes mellitus with hyperglycemia: Secondary | ICD-10-CM

## 2023-04-16 DIAGNOSIS — F515 Nightmare disorder: Secondary | ICD-10-CM

## 2023-04-16 DIAGNOSIS — F418 Other specified anxiety disorders: Secondary | ICD-10-CM

## 2023-04-16 MED ORDER — PRAZOSIN HCL 2 MG PO CAPS: 4.0000 mg | ORAL_CAPSULE | Freq: Every day | ORAL | 0 refills | Status: DC

## 2023-04-16 MED ORDER — SITAGLIPTIN PHOSPHATE 100 MG PO TABS: 100.0000 mg | ORAL_TABLET | Freq: Every day | ORAL | 3 refills | Status: DC

## 2023-04-16 NOTE — Patient Instructions (Signed)
[phone] Sabino Niemann, pharmacist

## 2023-04-16 NOTE — Progress Notes (Signed)
        Established patient visit  History, exam, impression, and plan:  1. Anxiety with depression Pleasant 52 year old female presenting today with a history of anxiety and depression that has been difficult to manage.  Notes that she feels like she is just a depressive type person and this is her baseline.  Has been taking Cymbalta however feels that she does not like the medication and it caused excessive flatulence.  Would like to discontinue the medication and is not interested in starting any other options at this time.  Has several stressors in her life and is working to manage those as well as possible.  Denies SI/HI.  Mood, thought pattern, cognition, affect, and speech pattern all normal.  Discontinue Cymbalta.  Monitor symptoms for worsening.  Advised to let me know should she change her mind and decide to try a different medication.  2. Nightmares She does have a history of nightmares related to PTSD.  Was on prazosin 2 mg nightly which was increased to 3 mg nightly approximately 4 weeks ago.  The first 2 weeks at this dose, she reports that the nightmares greatly improved however over the last weekend, had several breakthrough nightmares.  Not currently doing any counseling due to financial constraints.  Blood pressure and heart rate stable increasing prazosin to 4 mg nightly.  3. Type 2 diabetes mellitus with hyperglycemia, without long-term current use of insulin (HCC) Was finally able to get Ozempic and has been taking her medications as prescribed.  Notes a history of being on saxagliptin with a previous provider and it was not helpful for her.  She also notes the higher risk of heart failure using saxagliptin which is upsetting because she has already had a heart attack in the past.  Requesting to switch back to Sitagliptin (Januvia) since she has taken this and done very well with it.  Not due for A1c today.  Discontinue saxagliptin.  Reordering Sitagliptin 100 mg daily with note of  intolerance to saxagliptin in hopes that this will get through insurance coverage.  Procedures performed this visit: None.  Return in about 4 weeks (around 05/14/2023) for DM follow up.  __________________________________ [name], DNP, APRN, FNP-BC Primary Care and Sports Medicine [facility]

## 2023-04-24 ENCOUNTER — Other Ambulatory Visit: Payer: BC Managed Care – PPO

## 2023-04-24 NOTE — Progress Notes (Unsigned)
   04/24/2023  Patient ID: [name], female   DOB: [birth year], 52 y.o.   MRN: [id number]  S/O Telephone visit to assist with affordability of diabetes medications.  -Current medications: glimepiride 8mg  daily, Toujeo Max 22-32 units daily, Ozempic 1mg  weekly, Januvia 100mg  daily  -A1c 8.9 -[employer] not wanting to [employer], but patient has concerns with covered options based on risk of HF -ozempic costs $100 per patient and pharmacy states copay card on file is not bringing cost down further -Patient is not sure if she has tried [place] or Farxiga in the past  -Check on CGM coverage- Radene Journey was sent to pharmacy, but patient has not been able to pick up   A/P -[facility] pharmacy, and patient picked up Januvia 100mg  on 7/22 for $5 copay for 1 month (has coupon on file) -Got new Ozempic copay card that should take copay down to $25/month.  Pharmacy states this was already picked up, so they cannot run a claim;  but I gave them new card information for next refill -Pharmacy processed Josephine Igo claim and it came back "not covered," but plan did not state if they preferred Dexcom, or if a PA is required.  Submitted PA in [facility]; if denied, will send an order for Dexcom to see if this is preferred on plan.  Lenna Gilford, PharmD, DPLA

## 2023-04-25 ENCOUNTER — Other Ambulatory Visit: Payer: BC Managed Care – PPO

## 2023-04-25 NOTE — Progress Notes (Unsigned)
   04/25/2023  Patient ID: Sarah Kane, female   DOB: 1971-04-15, 52 y.o.   MRN: [id number]  S/O  Medication Access/Adherence  -Patient endorses insurance company informed PCP they would no longer cover Januvia even though claim on 7/22 went through and with copay card was $5 -Patient states she had also got new copay card for Ozempic and provided that to the pharmacy, but this still made copay $97 when it was previously $25 -Informed her that I am working on PA for [employer] 3 sensors  Medication Access/Adherence [employer] verified that Januvia 100mg  is still a covered benefit  -Copay $247 on insurance for 7/22 claim -Ozempic is a tier 2 medication, which means patient is responsible for 25% cost of medication -No tier reduction request option since medication is a brand name -Insurance was not able to see any Ozempic claims from 2023 to compare, but they state all claims for 2024 had the same copay [employer] prefers Dexcom, and 1 month supply of G7 sensor would be $50, 3 month $114 with [facility] home delivery  Follow-up:  Telephone visit 8/1   Sarah Kane, PharmD, DPLA

## 2023-04-26 ENCOUNTER — Other Ambulatory Visit: Payer: Self-pay | Admitting: Internal Medicine

## 2023-04-26 ENCOUNTER — Other Ambulatory Visit: Payer: BC Managed Care – PPO

## 2023-04-26 NOTE — Progress Notes (Signed)
   04/26/2023  Patient ID: Sarah Kane, female   DOB: 1971-02-14, 52 y.o.   MRN: [id number]  Telephone follow-up to discuss access/affordability of medications  -Informed patient that prior authorization for CGM was denied by insurance but did discuss the voucher available to get Libre3 for $75/month- she prefers to continue finger sticks at this time.  Discussed technique/timing to better fit this into her routine. -Januvia is still covered on insurance formulary, so this should continue to go through at $5 copay with coupon -Patient states metoprolol was recently decreased from 25mg  XL BID to once daily.  She endorses for the past week when she lays down, she can feel heart racing and nightmares have increased. -BP (at work on machine) 179/106 -She takes her BP medications before going to bed after 3rd shift- recommended to take her blood pressure medications before work and f/u on this with Joy at 8/21 appointment.  If BP remains elevated and patient is still having symptoms, I recommend changing to metoprolol tartrate 25mg  BID  Lenna Gilford, PharmD, DPLA

## 2023-05-03 ENCOUNTER — Encounter: Payer: Self-pay | Admitting: Medical-Surgical

## 2023-05-16 ENCOUNTER — Encounter: Payer: Self-pay | Admitting: Medical-Surgical

## 2023-05-16 ENCOUNTER — Ambulatory Visit (INDEPENDENT_AMBULATORY_CARE_PROVIDER_SITE_OTHER): Payer: BC Managed Care – PPO | Admitting: Medical-Surgical

## 2023-05-16 VITALS — BP 123/86 | HR 82 | Ht 60.0 in | Wt 189.1 lb

## 2023-05-16 DIAGNOSIS — E1165 Type 2 diabetes mellitus with hyperglycemia: Secondary | ICD-10-CM | POA: Diagnosis not present

## 2023-05-16 DIAGNOSIS — G894 Chronic pain syndrome: Secondary | ICD-10-CM

## 2023-05-16 DIAGNOSIS — F515 Nightmare disorder: Secondary | ICD-10-CM

## 2023-05-16 DIAGNOSIS — F418 Other specified anxiety disorders: Secondary | ICD-10-CM

## 2023-05-16 DIAGNOSIS — Z7985 Long-term (current) use of injectable non-insulin antidiabetic drugs: Secondary | ICD-10-CM

## 2023-05-16 DIAGNOSIS — I10 Essential (primary) hypertension: Secondary | ICD-10-CM | POA: Diagnosis not present

## 2023-05-16 LAB — POCT GLYCOSYLATED HEMOGLOBIN (HGB A1C)
HbA1c, POC (controlled diabetic range): 9 % — AB (ref 0.0–7.0)
Hemoglobin A1C: 9 % — AB (ref 4.0–5.6)

## 2023-05-16 MED ORDER — GLIMEPIRIDE 4 MG PO TABS
ORAL_TABLET | ORAL | 0 refills | Status: DC
Start: 2023-05-16 — End: 2023-09-24

## 2023-05-16 MED ORDER — GABAPENTIN 300 MG PO CAPS: 300.0000 mg | ORAL_CAPSULE | Freq: Three times a day (TID) | ORAL | 0 refills | Status: DC

## 2023-05-16 NOTE — Progress Notes (Unsigned)
        Established patient visit  History, exam, impression, and plan:  1. Visit for suture removal Pleasant 52 year old female accompanied by his wife presenting today for suture removal.  [place] that involved head trauma and a left ear laceration.  He was evaluated at the emergency room and treated accordingly.  Today, he reports that his ear has been itchy but he has had no pain, fever, purulent drainage, or swelling to the area.  The original gauze bandage is still in place and they have not removed it for site care. Dressing removed. Three sutures intact to the lower ear lobe. No signs of infection. Sutures removed successfully. Patient tolerated well. One small area noted on the lateral edge of the earlobe that was open with the edges not well approximated. No bleeding noted. Dermabond applied to facilitate closure and provide support given the location of the wound.  Advised to monitor for signs/symptoms of infection.  Reviewed site care recommendations with Dermabond in place.   Procedures performed this visit: None.  Return if symptoms worsen or fail to improve.  __________________________________ [name], DNP, APRN, FNP-BC Primary Care and Sports Medicine [facility] [place]

## 2023-05-17 DIAGNOSIS — F515 Nightmare disorder: Secondary | ICD-10-CM | POA: Insufficient documentation

## 2023-05-17 NOTE — Assessment & Plan Note (Signed)
She has a history of nightmares related to PTSD.  We tried Minipress at 1 mg, 2 mg, 3 mg, and 4 mg dosing.  At the 3 mg dosing, she tolerated the medication well but did not feel it was excessively helpful.  When we went up to 4 mg, she felt the medication made the nightmares worse.  At this point, she does not feel like the medication was helpful at all and is not interested in adding further options.  Discontinue Minipress.  Advised that we can always look at other options should she change her mind.

## 2023-05-17 NOTE — Assessment & Plan Note (Signed)
Not currently on any medications for mood management.  Per patient report, she feels that her depressive nature is who she is as a person and that she has been this way her entire life.  Not interested in adding medications.  Counseling is not an option due to financial constraints.  Denies SI/HI.  Plan for close follow-up.

## 2023-05-17 NOTE — Assessment & Plan Note (Signed)
She does have a history of type 2 diabetes that has been uncontrolled.  She has been working with our clinical pharmacist regarding patient assistance due to financial constraints.  She is compliant with daily dosing of Januvia 100 mg daily.  Using Toujeo and glimepiride as prescribed.  Was unable to find a cheaper option to afford Ozempic but has found a way to pay out-of-pocket for it.  Now using Ozempic 1 mg weekly.  Not regularly checking her sugars at home.  Admits that she has room for improvement in her diet.  No regular intentional exercise.  Last hemoglobin A1c at 8.9%.  Recheck today at 9.0%.  Notes that she has only been on the Ozempic for a few weeks so feel this is not an accurate representation of her current medications.  Routing for FYI to clinical pharmacy.  Continue current medications at current doses.  Check sugars at least 3 times weekly to give [place] an idea of fasting glucose range.  Recheck hemoglobin A1c in 3 months.

## 2023-05-17 NOTE — Assessment & Plan Note (Signed)
History of hypertension which is managed by cardiology.  Today she reports that her blood pressures have recently been very labile.  She is taking her medications as prescribed and working to avoid missed doses.  When she spoke with our clinical pharmacist regarding her medication management, she was advised that she should not be taking Toprol XL more than once a day.  She decreased the Toprol XL to 25 mg once daily however noted that her blood pressure was no longer well-controlled after this change.  She has returned to Toprol XL 25 mg twice daily and her blood pressure is at goal today.  Advised to reach out to her cardiologist and make sure that they are aware of the situation.

## 2023-05-17 NOTE — Assessment & Plan Note (Signed)
Pleasant 52 year old female presenting today with a history of chronic pain that was previously treated by pain management with opioids however she has been off of this for a while.  She is currently taking gabapentin 300 mg 3 times daily along with as needed ibuprofen and Tylenol.  She continues to have significant level of pain and periods of exacerbation that make daily activities difficult.  Her biggest concern today is bilateral hand pain from Dupuytren's contractures.  As she has multiple MSK concerns, feel she would benefit from evaluation with sports medicine or orthopedics.  Recommend scheduling an appointment with Dr. Benjamin Stain here in our office to discuss further options.  Continue gabapentin as prescribed for now but may need adjustment in dosing.

## 2023-07-08 ENCOUNTER — Emergency Department (HOSPITAL_BASED_OUTPATIENT_CLINIC_OR_DEPARTMENT_OTHER)
Admission: EM | Admit: 2023-07-08 | Discharge: 2023-07-08 | Disposition: A | Discharge status: Home / Self Care | Payer: BC Managed Care – PPO | Attending: Emergency Medicine | Admitting: Emergency Medicine

## 2023-07-08 ENCOUNTER — Encounter (HOSPITAL_BASED_OUTPATIENT_CLINIC_OR_DEPARTMENT_OTHER): Payer: Self-pay | Admitting: Emergency Medicine

## 2023-07-08 ENCOUNTER — Other Ambulatory Visit: Payer: Self-pay

## 2023-07-08 DIAGNOSIS — Z8616 Personal history of COVID-19: Secondary | ICD-10-CM | POA: Insufficient documentation

## 2023-07-08 DIAGNOSIS — Z79899 Other long term (current) drug therapy: Secondary | ICD-10-CM | POA: Insufficient documentation

## 2023-07-08 DIAGNOSIS — Z7902 Long term (current) use of antithrombotics/antiplatelets: Secondary | ICD-10-CM | POA: Diagnosis not present

## 2023-07-08 DIAGNOSIS — H05012 Cellulitis of left orbit: Secondary | ICD-10-CM | POA: Diagnosis not present

## 2023-07-08 DIAGNOSIS — Z7984 Long term (current) use of oral hypoglycemic drugs: Secondary | ICD-10-CM | POA: Insufficient documentation

## 2023-07-08 DIAGNOSIS — H0289 Other specified disorders of eyelid: Secondary | ICD-10-CM | POA: Diagnosis not present

## 2023-07-08 DIAGNOSIS — I2510 Atherosclerotic heart disease of native coronary artery without angina pectoris: Secondary | ICD-10-CM | POA: Diagnosis not present

## 2023-07-08 DIAGNOSIS — Z794 Long term (current) use of insulin: Secondary | ICD-10-CM | POA: Insufficient documentation

## 2023-07-08 DIAGNOSIS — E119 Type 2 diabetes mellitus without complications: Secondary | ICD-10-CM | POA: Insufficient documentation

## 2023-07-08 DIAGNOSIS — L03213 Periorbital cellulitis: Secondary | ICD-10-CM

## 2023-07-08 DIAGNOSIS — H00014 Hordeolum externum left upper eyelid: Secondary | ICD-10-CM | POA: Insufficient documentation

## 2023-07-08 DIAGNOSIS — I10 Essential (primary) hypertension: Secondary | ICD-10-CM | POA: Diagnosis not present

## 2023-07-08 LAB — CBG MONITORING, ED: Glucose-Capillary: 237 mg/dL — ABNORMAL HIGH (ref 70–99)

## 2023-07-08 MED ORDER — SULFAMETHOXAZOLE-TRIMETHOPRIM 800-160 MG PO TABS: 1.0000 | ORAL_TABLET | Freq: Two times a day (BID) | ORAL | 0 refills | Status: AC

## 2023-07-08 MED ORDER — SULFAMETHOXAZOLE-TRIMETHOPRIM 800-160 MG PO TABS
1.0000 | ORAL_TABLET | Freq: Once | ORAL | Status: AC
  Administered 2023-07-08: 1 via ORAL
  Filled 2023-07-08: qty 1

## 2023-07-08 MED ORDER — CEFDINIR 300 MG PO CAPS
300.0000 mg | ORAL_CAPSULE | Freq: Two times a day (BID) | ORAL | Status: DC
  Administered 2023-07-08: 300 mg via ORAL
  Filled 2023-07-08: qty 1

## 2023-07-08 MED ORDER — CEFDINIR 300 MG PO CAPS: 300.0000 mg | ORAL_CAPSULE | Freq: Two times a day (BID) | ORAL | 0 refills | Status: AC

## 2023-07-08 NOTE — ED Provider Notes (Signed)
[facility] Provider Note  CSN: [id number] Arrival date & time: 07/08/23 1723  Chief Complaint(s) Eye Problem  HPI Sarah Kane is a 52 y.o. female history diabetes presenting to the emergency department with left upper eyelid swelling.  Reports 2 or 3 days of this.  It is gotten slightly worse and more painful.  No vision changes, painful eye motion.  No fevers or chills.  Symptoms mild.  Has been using warm compresses without significant improvement.   Past Medical History Past Medical History:  Diagnosis Date   Arthritis    Coronary artery disease    COVID-19 05/2020   Diabetes mellitus without complication (HCC)    Diabetic retinopathy (HCC)    Hyperlipidemia    Hypertension    Hypertensive retinopathy    Myocardial infarct, old    Patient Active Problem List   Diagnosis Date Noted   Nightmares 05/17/2023   Unstable angina (HCC)    Anxiety with depression 06/10/2020   Hyperlipidemia LDL goal <70 05/22/2020   CAD (coronary artery disease) 05/22/2020   Ischemic cardiomyopathy 05/22/2020   NSTEMI (non-ST elevated myocardial infarction) (HCC) 05/20/2020   Type 2 diabetes mellitus with hyperglycemia, without long-term current use of insulin (HCC) 10/23/2019   Essential hypertension 10/29/2018   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   Dupuytren's contracture of both hands 07/26/2018   Chronic pain 06/28/2018   Arthritis of left acromioclavicular joint 10/03/2017   Carpal tunnel syndrome 03/06/2017   Complex regional pain syndrome 06/16/2016   Home Medication(s) Prior to Admission medications   Medication Sig Start Date End Date Taking? Authorizing Provider  cefdinir (OMNICEF) 300 MG capsule Take 1 capsule (300 mg total) by mouth 2 (two) times daily for 7 days. 07/08/23 07/15/23 Yes Lonell Grandchild, MD  sulfamethoxazole-trimethoprim (BACTRIM DS) 800-160 MG tablet Take 1 tablet by mouth 2 (two) times daily for 7  days. 07/08/23 07/15/23 Yes Lonell Grandchild, MD  acetaminophen (TYLENOL) 500 MG tablet Take 1,000 mg by mouth every 6 (six) hours as needed for mild pain or headache.     [provider]  atorvastatin (LIPITOR) 80 MG tablet Take 1 tablet by mouth once daily 04/16/23   Pricilla Riffle, MD  clopidogrel (PLAVIX) 75 MG tablet Take 1 tablet (75 mg total) by mouth daily. 03/22/23   Pricilla Riffle, MD  gabapentin (NEURONTIN) 300 MG capsule Take 1 capsule (300 mg total) by mouth 3 (three) times daily. 05/16/23   Christen Butter, NP  glimepiride (AMARYL) 4 MG tablet TAKE 2 TABLETS BY MOUTH ONCE DAILY WITH BREAKFAST. 05/16/23   Jessup, Joy, NP  insulin glargine, 2 Unit Dial, (TOUJEO MAX SOLOSTAR) 300 UNIT/ML Solostar Pen Inject 14 Units into the skin daily. Increase Toujeo to 14 units daily. Check sugars every 3 days. If fasting reading is greater than 130, increase the Toujeo dose by 2 units. If sugar is 90 or less, decrease Toujeo by 2 units. Max Toujeo dose 40 units daily. 08/15/22   Christen Butter, NP  Insulin Pen Needle 31G X 5 MM MISC Use one needle with Toujeo insulin pen to inject insulin once daily. 08/15/22   Christen Butter, NP  isosorbide mononitrate (IMDUR) 30 MG 24 hr tablet TAKE 1 & 1/2 (ONE & ONE-HALF) TABLETS BY MOUTH ONCE DAILY 04/26/23   Pricilla Riffle, MD  metoprolol succinate (TOPROL-XL) 25 MG 24 hr tablet Take 1 tablet (25 mg total) by mouth daily. 03/22/23   Pricilla Riffle,  MD  prazosin (MINIPRESS) 2 MG capsule Take 2 capsules (4 mg total) by mouth at bedtime. 04/16/23   Christen Butter, NP  Semaglutide, 1 MG/DOSE, (OZEMPIC, 1 MG/DOSE,) 4 MG/3ML SOPN Inject 1 mg into the skin once a week.    [provider]  sitaGLIPtin (JANUVIA) 100 MG tablet Take 1 tablet (100 mg total) by mouth daily. 04/16/23   Christen Butter, NP                                                                                                                                    Past Surgical History Past Surgical History:   Procedure Laterality Date   CARDIAC CATHETERIZATION     CESAREAN SECTION  1990   CORONARY STENT INTERVENTION N/A 05/20/2020   Procedure: CORONARY STENT INTERVENTION;  Surgeon: Kathleene Hazel, MD;  Location: [facility];  Service: Cardiovascular;  Laterality: N/A;   CORONARY STENT INTERVENTION N/A 07/22/2020   Procedure: CORONARY STENT INTERVENTION;  Surgeon: Kathleene Hazel, MD;  Location: [facility]AS[facility] CV [facility];  Service: Cardiovascular;  Laterality: N/A;   LEFT HEART CATH AND CORONARY ANGIOGRAPHY N/A 05/20/2020   Procedure: LEFT HEART CATH AND CORONARY ANGIOGRAPHY;  Surgeon: Kathleene Hazel, MD;  Location: [facility] INVASIVE CV [facility];  Service: Cardiovascular;  Laterality: N/A;   TRIGGER FINGER RELEASE Right    thumb   Family History Family History  Problem Relation Age of Onset   High blood pressure Mother    Diabetes Mother    Diabetes Maternal Grandmother    High blood pressure Maternal Grandmother    Heart attack Maternal Grandfather    Diabetes Maternal Grandfather    High blood pressure Maternal Grandfather    High blood pressure Paternal Grandmother    High blood pressure Paternal Grandfather    Diabetes Maternal Aunt    Diabetes Maternal Uncle    Skin cancer Maternal Uncle     Social History Social History   Tobacco Use   Smoking status: Never   Smokeless tobacco: Never  Vaping Use   Vaping status: Never Used  Substance Use Topics   Alcohol use: No   Drug use: No   Allergies Other and Nitroglycerin  Review of Systems Review of Systems  All other systems reviewed and are negative.   Physical Exam Vital Signs  I have reviewed the triage vital signs BP (!) 149/109 (BP Location: Left Arm)   Pulse 76   Temp 98.1 F (36.7 C) (Oral)   Resp 16   Ht 5' (1.524 m)   Wt 83.9 kg   SpO2 97%   BMI 36.13 kg/m  Physical Exam Vitals and nursing note reviewed.  Constitutional:      Appearance: Normal appearance.  HENT:     Head:  Normocephalic and atraumatic.     Mouth/Throat:     Mouth: Mucous membranes are moist.  Eyes:     Extraocular Movements: Extraocular  movements intact.     Conjunctiva/sclera: Conjunctivae normal.     Pupils: Pupils are equal, round, and reactive to light.     Comments: Left upper eyelid with large stye on the lateral margin with surrounding erythema extending laterally and medially.  Extraocular movements painful and full.  No proptosis.  Visual acuity grossly normal.  Cardiovascular:     Rate and Rhythm: Normal rate.  Pulmonary:     Effort: Pulmonary effort is normal. No respiratory distress.  Abdominal:     General: Abdomen is flat.  Musculoskeletal:        General: No deformity.  Skin:    General: Skin is warm and dry.     Capillary Refill: Capillary refill takes less than 2 seconds.  Neurological:     General: No focal deficit present.     Mental Status: She is alert. Mental status is at baseline.  Psychiatric:        Mood and Affect: Mood normal.        Behavior: Behavior normal.     ED Results and Treatments Labs (all labs ordered are listed, but only abnormal results are displayed) Labs Reviewed  CBG MONITORING, ED - Abnormal; Notable for the following components:      Result Value   Glucose-Capillary 237 (*)    All other components within normal limits                                                                                                                          Radiology No results found.  Pertinent labs & imaging results that were available during my care of the patient were reviewed by me and considered in my medical decision making (see MDM for details).  Medications Ordered in ED Medications  sulfamethoxazole-trimethoprim (BACTRIM DS) 800-160 MG per tablet 1 tablet (has no administration in time range)  cefdinir (OMNICEF) capsule 300 mg (has no administration in time range)                                                                                                                                      Procedures Procedures  (including critical care time)  Medical Decision Making / ED Course   MDM:  52 year old presenting to the emergency department with eye infection.  Patient has evidence of a stye.  There is some spreading erythema around the area and patient is  a diabetic so we will treat for periorbital cellulitis.  Discussed warm compresses.  No sign of any globe issues or visual disturbance.  Extreme low concern for orbital cellulitis. Will discharge patient to home. All questions answered. Patient comfortable with plan of discharge. Return precautions discussed with patient and specified on the after visit summary.       Lab Tests: -I ordered, reviewed, and interpreted labs.   The pertinent results include:   Labs Reviewed  CBG MONITORING, ED - Abnormal; Notable for the following components:      Result Value   Glucose-Capillary 237 (*)    All other components within normal limits    Notable for elevated glucose  Medicines ordered and prescription drug management: Meds ordered this encounter  Medications   sulfamethoxazole-trimethoprim (BACTRIM DS) 800-160 MG per tablet 1 tablet   cefdinir (OMNICEF) capsule 300 mg   sulfamethoxazole-trimethoprim (BACTRIM DS) 800-160 MG tablet    Sig: Take 1 tablet by mouth 2 (two) times daily for 7 days.    Dispense:  14 tablet    Refill:  0   cefdinir (OMNICEF) 300 MG capsule    Sig: Take 1 capsule (300 mg total) by mouth 2 (two) times daily for 7 days.    Dispense:  14 capsule    Refill:  0    -I have reviewed the patients home medicines and have made adjustments as needed   Social Determinants of Health:  Diagnosis or treatment significantly limited by social determinants of health: obesity   Co morbidities that complicate the patient evaluation  Past Medical History:  Diagnosis Date   Arthritis    Coronary artery disease    COVID-19 05/2020   Diabetes  mellitus without complication (HCC)    Diabetic retinopathy (HCC)    Hyperlipidemia    Hypertension    Hypertensive retinopathy    Myocardial infarct, old       Dispostion: Disposition decision including need for hospitalization was considered, and patient discharged from emergency department.    Final Clinical Impression(s) / ED Diagnoses Final diagnoses:  Hordeolum externum of left upper eyelid  Periorbital cellulitis of left eye     This chart was dictated using voice recognition software.  Despite best efforts to proofread,  errors can occur which can change the documentation meaning.    Lonell Grandchild, MD 07/08/23 [phone]

## 2023-07-08 NOTE — ED Triage Notes (Signed)
Pt reports LT eye swelling and pain x 2-3 d; hx of periorbital cellulitis

## 2023-07-08 NOTE — Discharge Instructions (Signed)
We evaluated you for your eye infection.  Your symptoms are most likely due to a blocked gland in your eyelid (stye).  You do have some signs of a spreading infection so we have started you on antibiotics.  Please be sure to use warm compresses is much as possible to help speed up the drainage of the cyst.  Please follow-up closely with your primary doctor.  If you have any new or worsening symptoms such as loss of vision, eye pain, painful movement of the eye, fevers or chills, spreading areas of redness, or any other new symptoms please return to the hospital for reassessment.

## 2023-07-11 ENCOUNTER — Other Ambulatory Visit: Payer: Self-pay | Admitting: Medical-Surgical

## 2023-08-13 ENCOUNTER — Other Ambulatory Visit: Payer: Self-pay | Admitting: Medical-Surgical

## 2023-08-13 DIAGNOSIS — E1165 Type 2 diabetes mellitus with hyperglycemia: Secondary | ICD-10-CM

## 2023-08-15 NOTE — Telephone Encounter (Signed)
Appointment 08/16/2023

## 2023-08-16 ENCOUNTER — Encounter: Payer: Self-pay | Admitting: Medical-Surgical

## 2023-08-16 ENCOUNTER — Ambulatory Visit (INDEPENDENT_AMBULATORY_CARE_PROVIDER_SITE_OTHER): Payer: BC Managed Care – PPO | Admitting: Medical-Surgical

## 2023-08-16 ENCOUNTER — Ambulatory Visit: Payer: BC Managed Care – PPO

## 2023-08-16 VITALS — BP 142/102 | HR 84 | Resp 20 | Ht 60.0 in | Wt 184.6 lb

## 2023-08-16 DIAGNOSIS — E113512 Type 2 diabetes mellitus with proliferative diabetic retinopathy with macular edema, left eye: Secondary | ICD-10-CM | POA: Diagnosis not present

## 2023-08-16 DIAGNOSIS — E113311 Type 2 diabetes mellitus with moderate nonproliferative diabetic retinopathy with macular edema, right eye: Secondary | ICD-10-CM | POA: Diagnosis not present

## 2023-08-16 DIAGNOSIS — F332 Major depressive disorder, recurrent severe without psychotic features: Secondary | ICD-10-CM

## 2023-08-16 DIAGNOSIS — M79645 Pain in left finger(s): Secondary | ICD-10-CM

## 2023-08-16 DIAGNOSIS — M069 Rheumatoid arthritis, unspecified: Secondary | ICD-10-CM

## 2023-08-16 DIAGNOSIS — I10 Essential (primary) hypertension: Secondary | ICD-10-CM | POA: Diagnosis not present

## 2023-08-16 DIAGNOSIS — E1165 Type 2 diabetes mellitus with hyperglycemia: Secondary | ICD-10-CM

## 2023-08-16 DIAGNOSIS — Z7984 Long term (current) use of oral hypoglycemic drugs: Secondary | ICD-10-CM

## 2023-08-16 LAB — POCT GLYCOSYLATED HEMOGLOBIN (HGB A1C)
HbA1c, POC (controlled diabetic range): 9.3 % — AB (ref 0.0–7.0)
Hemoglobin A1C: 9.3 % — AB (ref 4.0–5.6)

## 2023-08-16 MED ORDER — OZEMPIC (1 MG/DOSE) 4 MG/3ML ~~LOC~~ SOPN: 1.0000 mg | PEN_INJECTOR | SUBCUTANEOUS | 3 refills | Status: DC

## 2023-08-16 MED ORDER — DEXCOM G7 SENSOR MISC: 3 refills | Status: DC

## 2023-08-16 NOTE — Progress Notes (Signed)
        Established patient visit  History, exam, impression, and plan:  1. Essential hypertension Pleasant 52 year old female presenting today with a history of hypertension.  She is managed by cardiology and is compliant with her medications.  Her blood pressure is elevated at 147/93 today.  Recheck of 142/102.  Upcoming appointment with cardiology on 11/26 and I have encouraged her to discuss recent elevations in blood pressure at that time.  2. Type 2 diabetes mellitus with hyperglycemia, without long-term current use of insulin (HCC) History of type 2 diabetes that has been uncontrolled.  Her last A1c was 9.0%.  She is currently taking glimepiride 8 mg in the morning, Toujeo 28-32 units daily depending on intake.  At times she does take less of the Toujeo if she is not eating regularly throughout the day.  Also taking Januvia 100 mg daily.  Has been on semaglutide 1 mg weekly but she was unable to pick this up from the pharmacy due to financial constraints.  She was off of this for approximately 1 month before finally restarting 2-3 weeks ago.  Hemoglobin A1c today at 9.3%.  Not checking sugars at home.  Was unable to afford the freestyle libre 3 sensors.  On evaluation, it looks like Dexcom may be covered by her insurance so sending these in today for continuous glucose monitoring.  Refilling semaglutide for 59-month supply to see if we can get this through home delivery at a lower cost.  Plan to continue semaglutide and other medications as prescribed.  Sending this note over the clinical pharmacy for their input on recommendations. - POCT HgB A1C  3. Rheumatoid arthritis involving right hand, unspecified whether rheumatoid factor present (HCC) Doing well overall.  Not followed by rheumatology.  No current complaints of the right hand.  4. Severe episode of recurrent major depressive disorder, without psychotic features (HCC) PHQ-9 and GAD-7 scores are at the top of the chart.  Patient does not  have any interest in therapy or medications at this time.  Even though she scored a 3 on thoughts of being better off dead or of self-harm, she reports she has no plan for self-harm and verbally contracted for safety today.  Plan to continue monitoring.  5. Proliferative diabetic retinopathy of left eye with macular edema associated with type 2 diabetes mellitus (HCC) 6. Moderate nonproliferative diabetic retinopathy of right eye with macular edema associated with type 2 diabetes mellitus (HCC) Overdue for eye exam.  Is trying to get her A1c down a bit before she goes but plans to update this ASAP.  7. Pain of left middle finger Known history of Dupuytren's contraction affecting the left hand however she had now has some pain in the left PIP joint along with significant range of motion limitation.  Unable to fully flex or extend the finger.  Tenderness over the DIP joint in all aspects.  No redness, swelling, or excessive warmth noted.  Unclear etiology.  Getting x-rays today.  Suspect osteoarthritis. - DG Hand Complete Left; Future  Procedures performed this visit: None.  Return in about 3 months (around 11/16/2023) for DM follow up.  __________________________________ [name], DNP, APRN, FNP-BC Primary Care and Sports Medicine [facility] [place]

## 2023-08-21 ENCOUNTER — Ambulatory Visit: Payer: BC Managed Care – PPO | Admitting: Internal Medicine

## 2023-08-22 ENCOUNTER — Encounter: Payer: Self-pay | Admitting: Emergency Medicine

## 2023-08-22 ENCOUNTER — Other Ambulatory Visit: Payer: Self-pay | Admitting: Medical-Surgical

## 2023-08-22 ENCOUNTER — Ambulatory Visit: Payer: BC Managed Care – PPO | Attending: Physician Assistant | Admitting: Emergency Medicine

## 2023-08-22 VITALS — BP 120/80 | HR 80 | Resp 99 | Ht 60.0 in | Wt 184.8 lb

## 2023-08-22 DIAGNOSIS — I10 Essential (primary) hypertension: Secondary | ICD-10-CM | POA: Diagnosis not present

## 2023-08-22 DIAGNOSIS — I2510 Atherosclerotic heart disease of native coronary artery without angina pectoris: Secondary | ICD-10-CM

## 2023-08-22 DIAGNOSIS — E1165 Type 2 diabetes mellitus with hyperglycemia: Secondary | ICD-10-CM | POA: Diagnosis not present

## 2023-08-22 DIAGNOSIS — E785 Hyperlipidemia, unspecified: Secondary | ICD-10-CM

## 2023-08-22 NOTE — Progress Notes (Signed)
Cardiology Office Note:    Date:  08/22/2023  ID:  JOBYNA WEIMAR, DOB 05-Mar-1971, MRN [id number] PCP: Christen Butter, NP  [facility] Providers Cardiologist:  Dietrich Pates, MD        Patient Profile:     Sarah Kane is a 52 year old female with past medical history of hypertension, hyperlipidemia, T2DM, rheumatoid arthritis, CAD.  She had an NSTEMI on 05/20/2020 at which she underwent LHC that showed diffuse CAD with 30% pLAD, 99% dLAD, 70% proximal LCx, 100% mRCA, 70% dRCA.  PCI/DES to RCA with resolution of her symptoms.  Of note staged PCI would be considered to the LCx if refractory symptoms.  On 05/21/2020 echocardiogram completed showing reduced EF 45-50%, no RWMA, grade 1 DD, RV SF normal.  On 07/22/2020 she continued to have angina and underwent LHC which showed proximal LCx lesion and 70% at which DES/PCI was placed with recommendations for DAPT with ASA and Brilinta for 1 year.    Echocardiogram completed 07/01/2021 showing LVEF 60 to 65%, no RWMA, severe hypokinesis of the left ventricular, basal/mid inferior wall and inferior septal wall, RV SF normal, no valvular abnormalities.  She was last seen by Dr. Tenny Craw on 03/22/2023.  She was doing well at the time.  She was switched from Brilinta and ASA to just Plavix.      History of Present Illness:   CHRISLYN GALLATIN is a 52 y.o. female who returns for 52-month follow-up.  Today she is doing well from a cardiac standpoint.  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  She notes that she does deal with a lot of anxiety.  She admits to not eating healthy however in 2025 she would like to improve her diet significantly.  She does drink one Diet Coke daily with about just 1 bottle of water.  She notes that money is her biggest stressor at this moment.  She is interested in looking for a new job that is no longer on night shift to improve her health.  She admits to her diabetes being uncontrolled, she notes that Ozempic is  the only thing that she has taken that truly decreases her A1c.  She is working on ways to pay for this medication as it is $125 a month for her.  She has switched back to her Toprol XL twice daily instead of once daily as she trialed this for only 1 week and her blood pressure was uncontrolled.  She does check her blood pressure at work when she feels somewhat symptomatic.  She notes that now she is taking her metoprolol twice daily her blood pressures under much better control.  She does have mild episodes of hypotension but she relates this to dehydration.  She denies chest pain, shortness of breath, DOE, orthopnea, syncope, near syncope, leg swelling, dizziness, lightheadedness.       Review of Systems  Constitutional: Negative for weight gain and weight loss.  Cardiovascular:  Negative for chest pain, claudication, cyanosis, dyspnea on exertion, irregular heartbeat, leg swelling, near-syncope, orthopnea, palpitations, paroxysmal nocturnal dyspnea and syncope.  Respiratory:  Negative for cough, hemoptysis and shortness of breath.   Gastrointestinal:  Negative for abdominal pain, hematochezia and melena.  Genitourinary:  Negative for hematuria.  Neurological:  Negative for dizziness, headaches and light-headedness.     See HPI    Studies Reviewed:       Echocardiogram 07/01/2021 1. Left ventricular ejection fraction, by estimation, is 60 to 65%. The  left ventricle has normal function. The left ventricle demonstrates  regional wall motion abnormalities (see scoring diagram/findings for  description). Left ventricular diastolic  parameters were normal. There is severe hypokinesis of the left  ventricular, basal-mid inferior wall and inferoseptal wall.   2. Right ventricular systolic function is normal. The right ventricular  size is normal.   3. The mitral valve is normal in structure. No evidence of mitral valve  regurgitation. No evidence of mitral stenosis.   4. The aortic valve is  normal in structure. Aortic valve regurgitation is  not visualized. No aortic stenosis is present.   5. The inferior vena cava is normal in size with greater than 50%  respiratory variability, suggesting right atrial pressure of 3 mmHg.    LHC 05/20/2020 1. Acute inferior MT/NSTEMI secondary to occlusion of the mid RCA. This is a dominant vessel.  2. Successful PTCA/DES x 2 mid and distal RCA 3. The LAD is a moderate caliber vessel that courses to the apex. The distal vessel is diabetic appearing, small in caliber and diffusely disease. Not a target for PCI.  4. 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  Diagnostic Dominance: Right  Intervention     LHC 07/14/2020 Prox Cx lesion is 70% stenosed. A drug-eluting stent was successfully placed using a SYNERGY XD 3.0X16. Post intervention, there is a 0% residual stenosis.   1. Severe stenosis proximal Circumflex artery 2. Successful PTCA/DES x 1 proximal Circumflex ntervention   Risk Assessment/Calculations:             Physical Exam:   VS:  BP 120/80   Pulse 80   Resp (!) 99   Ht 5' (1.524 m)   Wt 184 lb 12.8 oz (83.8 kg)   BMI 36.09 kg/m    Wt Readings from Last 3 Encounters:  08/22/23 184 lb 12.8 oz (83.8 kg)  08/16/23 184 lb 9.6 oz (83.7 kg)  07/08/23 185 lb (83.9 kg)    Constitutional:      Appearance: Normal and healthy appearance.  HENT:     Head: Normocephalic.  Neck:     Vascular: JVD normal.  Pulmonary:     Effort: Pulmonary effort is normal.     Breath sounds: Normal breath sounds.  Chest:     Chest wall: Not tender to palpatation.  Cardiovascular:     PMI at left midclavicular line. Normal rate. Regular rhythm. Normal S1. Normal S2.      Murmurs: There is no murmur.     No gallop.  No click. No rub.  Pulses:    Intact distal pulses.  Edema:    Peripheral edema absent.  Musculoskeletal: Normal range of motion.     Cervical back: Normal range of motion and neck supple. Skin:    General: Skin  is warm and dry.  Neurological:     General: No focal deficit present.     Mental Status: Alert, oriented to person, place, and time and oriented to person, place and time.  Psychiatric:        Attention and Perception: Attention and perception normal.        Mood and Affect: Mood normal.        Behavior: Behavior is cooperative.        Thought Content: Thought content normal.       Assessment and Plan:  Coronary artery disease -S/p 05/20/2020 PCI/DES to RCA, 07/22/2020 PCI/DES to pLCx -Stable with no anginal symptoms, no indication for ischemic evaluation  -GDMT  atorvastatin 80 mg, Plavix 75 mg isosorbide 45 mg, Toprol-XL 25 mg -Encouraged heart healthy dieting.  Increase fiber, vegetables, fruits.  Decrease processed foods and sugar  Hyperlipidemia, goal <70 -LDL 43, HDL 53, triglycerides 118, TC 117 on 03/22/2023 -Well-controlled and below goal -Continue atorvastatin 80 mg  Hypertension -BP today 120/80, well-controlled -Continue Toprol-XL 25 mg twice daily, isosorbide 45 mg -Stay adequately hydrated, continue taking blood pressure at home   T2DM -Hemoglobin A1c 9.3 on 08/16/2023, currently uncontrolled -She tells me the only thing she has seen decrease her A1c is Ozempic -Financial issues causing her to be off of Ozempic at this time, she is working with PCP and pharmacy with finances help -Continue Amaryl, Toujeo, Januvia.  Start back Ozempic when financially stable                   Dispo:  Return in about 1 year (around 08/21/2024).  Signed, Denyce Robert, NP

## 2023-08-22 NOTE — Patient Instructions (Signed)
Medication Instructions:  Your physician recommends that you continue on your current medications as directed. Please refer to the Current Medication list given to you today.  *If you need a refill on your cardiac medications before your next appointment, please call your pharmacy*   Lab Work: None ordered   If you have labs (blood work) drawn today and your tests are completely normal, you will receive your results only by: MyChart Message (if you have MyChart) OR A paper copy in the mail If you have any lab test that is abnormal or we need to change your treatment, we will call you to review the results.   Testing/Procedures: None ordered    Follow-Up: At [facility], you and your health needs are our priority.  As part of our continuing mission to provide you with exceptional heart care, we have created designated Provider Care Teams.  These Care Teams include your primary Cardiologist (physician) and Advanced Practice Providers (APPs -  Physician Assistants and Nurse Practitioners) who all work together to provide you with the care you need, when you need it.  We recommend signing up for the patient portal called "MyChart".  Sign up information is provided on this After Visit Summary.  MyChart is used to connect with patients for Virtual Visits (Telemedicine).  Patients are able to view lab/test results, encounter notes, upcoming appointments, etc.  Non-urgent messages can be sent to your provider as well.   To learn more about what you can do with MyChart, go to [email or web address].    Your next appointment:   12 month(s)  Provider:   Dietrich Pates, MD     Other Instructions

## 2023-08-22 NOTE — Telephone Encounter (Signed)
Copied from CRM [id number]. Topic: Clinical - Prescription Issue >> Aug 22, 2023  9:50 AM Conni Elliot wrote: Reason for CRM: Pt states provider sent refill order for Metoprolol and Gabapentin rx to pharmacy after appt, pharmacy states there are no refills left, pt is also out of Gabapentin rx, pt would like to bbe contacted through [employer]

## 2023-08-31 ENCOUNTER — Other Ambulatory Visit: Payer: Self-pay | Admitting: Internal Medicine

## 2023-08-31 ENCOUNTER — Telehealth: Payer: Self-pay | Admitting: Internal Medicine

## 2023-08-31 MED ORDER — ATORVASTATIN CALCIUM 80 MG PO TABS: 80.0000 mg | ORAL_TABLET | Freq: Every day | ORAL | 3 refills | Status: DC

## 2023-08-31 MED ORDER — METOPROLOL SUCCINATE ER 25 MG PO TB24: 25.0000 mg | ORAL_TABLET | Freq: Two times a day (BID) | ORAL | 3 refills | Status: DC

## 2023-08-31 MED ORDER — ISOSORBIDE MONONITRATE ER 30 MG PO TB24: ORAL_TABLET | ORAL | 3 refills | Status: DC

## 2023-08-31 MED ORDER — CLOPIDOGREL BISULFATE 75 MG PO TABS: 75.0000 mg | ORAL_TABLET | Freq: Every day | ORAL | 3 refills | Status: DC

## 2023-08-31 NOTE — Telephone Encounter (Signed)
*  STAT* If patient is at the pharmacy, call can be transferred to refill team.   1. Which medications need to be refilled? (please list name of each medication and dose if known)  metoprolol succinate (TOPROL-XL) 25 MG 24 hr tablet   atorvastatin (LIPITOR) 80 MG tablet   clopidogrel (PLAVIX) 75 MG tablet    isosorbide mononitrate (IMDUR) 30 MG 24 hr tablet     2. Which pharmacy/location (including street and city if local pharmacy) is medication to be sent to?  3. [id number] - [place], [place] - [address] Phone: [phone]  Fax: [phone]    o they need a 30 day or 90 day supply? 90

## 2023-09-06 ENCOUNTER — Telehealth: Payer: Self-pay

## 2023-09-06 NOTE — Telephone Encounter (Signed)
Patient scheduled for 09/20/23  with Christen Butter, NP  She has questions for provider regarding her BS numbers related to Dexcom readings.

## 2023-09-06 NOTE — Telephone Encounter (Signed)
Copied from CRM [id number]. Topic: Clinical - Medical Advice >> Sep 05, 2023  8:37 AM Herbert Seta B wrote: Reason for CRM: Continuous Glucose Sensor (DEXCOM G7 SENSOR) MISC Patient just received, needs assistance/instructions on daily use.

## 2023-09-20 ENCOUNTER — Other Ambulatory Visit: Payer: Self-pay | Admitting: Medical-Surgical

## 2023-09-20 ENCOUNTER — Ambulatory Visit: Payer: BC Managed Care – PPO

## 2023-09-20 ENCOUNTER — Encounter: Payer: Self-pay | Admitting: Medical-Surgical

## 2023-09-20 ENCOUNTER — Telehealth: Payer: Self-pay | Admitting: *Deleted

## 2023-09-20 ENCOUNTER — Ambulatory Visit (INDEPENDENT_AMBULATORY_CARE_PROVIDER_SITE_OTHER): Payer: BC Managed Care – PPO | Admitting: Medical-Surgical

## 2023-09-20 VITALS — BP 113/76 | HR 81 | Ht 60.0 in | Wt 189.1 lb

## 2023-09-20 DIAGNOSIS — Z7984 Long term (current) use of oral hypoglycemic drugs: Secondary | ICD-10-CM

## 2023-09-20 DIAGNOSIS — I10 Essential (primary) hypertension: Secondary | ICD-10-CM

## 2023-09-20 DIAGNOSIS — E1165 Type 2 diabetes mellitus with hyperglycemia: Secondary | ICD-10-CM

## 2023-09-20 DIAGNOSIS — Z5941 Food insecurity: Secondary | ICD-10-CM

## 2023-09-20 DIAGNOSIS — I25119 Atherosclerotic heart disease of native coronary artery with unspecified angina pectoris: Secondary | ICD-10-CM

## 2023-09-20 DIAGNOSIS — M533 Sacrococcygeal disorders, not elsewhere classified: Secondary | ICD-10-CM

## 2023-09-20 MED ORDER — TRAMADOL HCL 50 MG PO TABS
50.0000 mg | ORAL_TABLET | Freq: Three times a day (TID) | ORAL | 0 refills | Status: AC | PRN
Start: 2023-09-20 — End: 2023-09-25

## 2023-09-20 MED ORDER — SEMAGLUTIDE (2 MG/DOSE) 8 MG/3ML ~~LOC~~ SOPN
2.0000 mg | PEN_INJECTOR | SUBCUTANEOUS | 3 refills | Status: DC
Start: 2023-09-20 — End: 2024-03-05

## 2023-09-20 MED ORDER — TOUJEO MAX SOLOSTAR 300 UNIT/ML ~~LOC~~ SOPN: 32.0000 [IU] | PEN_INJECTOR | Freq: Every day | SUBCUTANEOUS | 1 refills | Status: DC

## 2023-09-20 NOTE — Progress Notes (Unsigned)
Complex Care Management Note Care Guide Note  09/20/2023 Name: AUBREONNA SANTOSO MRN: [id number] DOB: November 08, 1970   Complex Care Management Outreach Attempts: An unsuccessful telephone outreach was attempted today to offer the patient information about available complex care management services.  Follow Up Plan:  Additional outreach attempts will be made to offer the patient complex care management information and services.   Encounter Outcome:  No Answer  Gwenevere Ghazi  Care Coordination Care Guide  Direct Dial: [phone]

## 2023-09-20 NOTE — Patient Instructions (Signed)
Switch Toujeo to nighttime when you wake up

## 2023-09-20 NOTE — Progress Notes (Signed)
        Established patient visit  History, exam, impression, and plan:  1. Type 2 diabetes mellitus with hyperglycemia, without long-term current use of insulin (HCC) (Primary) Pleasant 52 year old female presenting today for reevaluation of type 2 diabetes.  Her recent A1c about 6 weeks ago showed that her diabetes remains uncontrolled.  She was able to get the Dexcom G7 for continuous glucose monitoring.  She has had this in place and reports that she is nervous about it because it now shows her sugars and gives her accountability for her p.o. intake.  She is compliant with her medications but is worried since her sugars when going to bed can drop to around 80 but they are at 250 or more when she wakes up.  Had been taking Toujeo 32 units (titrated depending on her sugars) before going to bed in the morning but got very nervous when her sugars were dropping and stopped taking the shot.  Working with a nutritionist who reports that she feels that she is not eating enough to keep her sugars well-managed.  See below regarding food insecurity.  Plan to continue glimepiride and sitagliptin.  Switch Toujeo to nighttime dosing when she wakes up.  Increasing Ozempic to 2 mg weekly.  Continue Dexcom monitoring.  Continue working with a nutritionist.  2. Essential hypertension 3. Coronary artery disease involving native heart with angina pectoris, unspecified vessel or lesion type [facility]) Managed by cardiology.  Blood pressure at goal and no current concerning symptoms.  4. Sacral pain Had a fall at work approximately 2 weeks ago where a pallet fell on her and she landed on her buttocks.  Since then has had significant/severe pain rated 10 out of 10 at the sacrum/coccyx.  Having difficulty with walking and sitting due to pain.  Using Tylenol and ibuprofen with minimal relief of symptoms.  Did not report this to her employer as a work-related injury.  Plan for x-rays today.  Adding tramadol 50 mg 3 times daily as  needed for 5 days.  Discussed conservative measures to use at home. - DG Sacrum/Coccyx; Future - traMADol (ULTRAM) 50 MG tablet; Take 1 tablet (50 mg total) by mouth every 8 (eight) hours as needed for up to 5 days.  Dispense: 15 tablet; Refill: 0  5. Moderate food insecurity She does have moderate food insecurity as noted above.  Discussed various options.  She makes too much money to qualify for food stamps.  Plan to refer to social work for social determinants of health regarding food insecurity to see if they have any resources that may be beneficial for her. - Ambulatory referral to Social Work   Procedures performed this visit: None.  Return for Appointment as scheduled in February 2025.  __________________________________ [name], DNP, APRN, FNP-BC Primary Care and Sports Medicine [facility] [place]

## 2023-10-01 NOTE — Progress Notes (Signed)
 Complex Care Management Note Care Guide Note  10/01/2023 Name: Sarah Kane MRN: [id number] DOB: [birth year]   Complex Care Management Outreach Attempts: A second unsuccessful outreach was attempted today to offer the patient with information about available complex care management services.  Follow Up Plan:  Additional outreach attempts will be made to offer the patient complex care management information and services.   Encounter Outcome:  No Answer  Harlene Satterfield  Care Coordination Care Guide  Direct Dial: [phone]

## 2023-10-02 NOTE — Progress Notes (Signed)
 Complex Care Management Note Care Guide Note  10/02/2023 Name: SHATEKA PETREA MRN: [id number] DOB: [birth year]   Complex Care Management Outreach Attempts: A third unsuccessful outreach was attempted today to offer the patient with information about available complex care management services.  Follow Up Plan:  No further outreach attempts will be made at this time. We have been unable to contact the patient to offer or enroll patient in complex care management services.  Encounter Outcome:  No Answer  Harlene Satterfield  Care Coordination Care Guide  Direct Dial: [phone]

## 2023-10-23 ENCOUNTER — Other Ambulatory Visit: Payer: Self-pay | Admitting: Medical-Surgical

## 2023-10-23 ENCOUNTER — Other Ambulatory Visit: Payer: Self-pay

## 2023-10-23 MED ORDER — GABAPENTIN 300 MG PO CAPS: 300.0000 mg | ORAL_CAPSULE | Freq: Three times a day (TID) | ORAL | 0 refills | Status: DC

## 2023-11-06 ENCOUNTER — Other Ambulatory Visit: Payer: Self-pay | Admitting: Medical-Surgical

## 2023-11-20 ENCOUNTER — Telehealth: Payer: Self-pay | Admitting: Internal Medicine

## 2023-11-20 ENCOUNTER — Ambulatory Visit (INDEPENDENT_AMBULATORY_CARE_PROVIDER_SITE_OTHER): Payer: BC Managed Care – PPO | Admitting: Medical-Surgical

## 2023-11-20 VITALS — BP 134/86 | HR 72 | Ht 60.0 in | Wt 184.8 lb

## 2023-11-20 DIAGNOSIS — Z5941 Food insecurity: Secondary | ICD-10-CM | POA: Insufficient documentation

## 2023-11-20 DIAGNOSIS — E1165 Type 2 diabetes mellitus with hyperglycemia: Secondary | ICD-10-CM

## 2023-11-20 DIAGNOSIS — R079 Chest pain, unspecified: Secondary | ICD-10-CM | POA: Diagnosis not present

## 2023-11-20 DIAGNOSIS — E785 Hyperlipidemia, unspecified: Secondary | ICD-10-CM | POA: Diagnosis not present

## 2023-11-20 DIAGNOSIS — Z7984 Long term (current) use of oral hypoglycemic drugs: Secondary | ICD-10-CM

## 2023-11-20 DIAGNOSIS — I10 Essential (primary) hypertension: Secondary | ICD-10-CM

## 2023-11-20 LAB — POCT GLYCOSYLATED HEMOGLOBIN (HGB A1C)
HbA1c, POC (controlled diabetic range): 9.5 % — AB (ref 0.0–7.0)
Hemoglobin A1C: 9.5 % — AB (ref 4.0–5.6)

## 2023-11-20 LAB — TROPONIN T: Troponin T (Highly Sensitive): 8 ng/L (ref 0–14)

## 2023-11-20 NOTE — Telephone Encounter (Signed)
 Spoke with patient and she states she has been having chest pain off and on since last Thursday. She has some pain in her shoulder blade. She stated that is what happened before her last heart attack. She saw PCP today and had EKG done. She was told to follow up with cardiology. Currently asymptomatic. Appointment scheduled with APP. ED precautions discussed

## 2023-11-20 NOTE — Progress Notes (Unsigned)
 Subjective:  Patient ID: Sarah Kane, female    DOB: [birth year], 53 y.o.   MRN: [id number]  Patient Care Team: Christen Butter, NP as PCP - General (Nurse Practitioner) Pricilla Riffle, MD as PCP - Cardiology (Cardiology) Gabriel Carina, [facility] (Inactive) (Pharmacist)   Chief Complaint:  Diabetes and soulder blade pain  (Patient c/o  shoulder blade pain/ internal itching - states she called 911  on Thursday 11/15/23 because thought she was having another heart attack . EMS did not see any abnormality so did not go to hospital . Requesting blood work today. )   HPI:  Sarah Kane is a 53 y.o. female presenting on 11/20/2023 for Diabetes and soulder blade pain  (Patient c/o  shoulder blade pain/ internal itching - states she called 911  on Thursday 11/15/23 because thought she was having another heart attack . EMS did not see any abnormality so did not go to hospital . Requesting blood work today. )  History, Exam,  Impression and Plan  1. Hyperlipidemia LDL goal <70 (Primary) Reports poor dietary intake and eating poorly and infrequently.  Patient is seeing nutritionist but is unsure when next appointment is.  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  Taking atorvastatin regularly and as directed.  Denies any side effects. Denies myalgia or malaise.  2. Type 2 diabetes mellitus with hyperglycemia, without long-term current use of insulin (HCC) Presents today for evaluation of diabetes type 2.  Historical A1c's run in the 9.0-10.0 range and A1c today was 9.5. Reports not taking her Toujeo long-acting insulin for 2 weeks because it drops her blood sugar to the 80s and makes her feel jittery and weak. Reports ongoing food insecurity and inability to access regular meals or healthy foods.  Patient has been seeing a nutritionist who has only been able to assist minimally with food procurement. Patient monitors blood sugars with the Dexcom G7 continuous blood  glucose monitor.  And reports taking Ozempic, Januvia, and Amaryl as directed.  Case manager and social work involved to assist with medication and food instability.  Lab Results  Component Value Date   HGBA1C 9.5 (A) 11/20/2023   HGBA1C 9.5 (A) 11/20/2023   HGBA1C 9.3 (A) 08/16/2023   HGBA1C 9.3 (A) 08/16/2023    3. Essential hypertension Patient takes metoprolol and isosorbide mononitrate for hypertension and takes her medications as prescribed. Patient reports atypical chest pain (see below) denies lower extremity, edema, blurry vision or shortness of breath.  Continue metoprolol and Isobride mononitrate.  Will check electrolyte, blood count and lipid panel. - Troponin T - CBC with Differential/Platelet - CMP14+EGFR - Lipid panel     11/20/2023    8:19 AM 09/20/2023    8:44 AM 08/22/2023    8:14 AM  Vitals with BMI  Systolic 134 113 562  Diastolic 86 76 80  Pulse 72 81 80    4. Chest pain, unspecified type Patient reports 10/10 radiating dull chest pain last Thursday, 15 November 2023 that she called 911 for and EMS arrived and got EKG that was normal.  Reports 10/10 pain started in the middle of her back on the left side and radiated around her left side to mid chest.  After EMS arrived and took her EKG she states tates she started feeling better and refused transport to emergency department.  States she was having similar pain in her back with non-STEMI 2 years ago.  Predisposing factors  to chest pain are lack of sleep, family stress, financial stress, and stress/concern for her health. Takes Plavix, Imdur, and Lipitor as directed. Patient reports a similar 6/10 pain today that is in her back but does not radiate. Describes as itching and dull middle of her back just left of center.  Pain reproducable with direct palpation between spine and scapula at the T3-T4 level. Patient to call and make appointment to see her cardiologist.  Will check troponin CBC CMP lipid panel today. -      Troponin T -     CBC with Differential/Platelet -     CMP14+EGFR -     Lipid panel  5. Moderate food insecurity Has moderate food insecurity as noted above.  As documented historically patient makes too much money to qualify for food stamps.  Plan to refer to social work for social determinants of health regarding food insecurity to see if they have any resources that may be beneficial for her. - Ambulatory referral to Social Work    Continue all other maintenance medications.    Follow up plan: Return in about 3 months (around 02/17/2024) for DM follow up.  Patient to call and set up appointment with cardiology and update on recent EMS visit for chest pain addressed above.  Reach out to complex care management for enrollment in complex care management services.  Specifically dealing with medication procurement and food insecurity.  Patient works night shift and warehouse.  Best time to contact patient is 0730-0900 a.m.    Relevant past medical, surgical, family, and social history reviewed and updated as indicated.  Allergies and medications reviewed and updated. Data reviewed: Chart in Epic.   Past Medical History:  Diagnosis Date   Arthritis    Coronary artery disease    COVID-19 05/2020   Diabetes mellitus without complication (HCC)    Diabetic retinopathy (HCC)    Hyperlipidemia    Hypertension    Hypertensive retinopathy    Myocardial infarct, old     Past Surgical History:  Procedure Laterality Date   CARDIAC CATHETERIZATION     CESAREAN SECTION  1990   CORONARY STENT INTERVENTION N/A 05/20/2020   Procedure: CORONARY STENT INTERVENTION;  Surgeon: Kathleene Hazel, MD;  Location: [facility];  Service: Cardiovascular;  Laterality: N/A;   CORONARY STENT INTERVENTION N/A 07/22/2020   Procedure: CORONARY STENT INTERVENTION;  Surgeon: Kathleene Hazel, MD;  Location: [facility];  Service: Cardiovascular;  Laterality: N/A;   LEFT HEART CATH AND  CORONARY ANGIOGRAPHY N/A 05/20/2020   Procedure: LEFT HEART CATH AND CORONARY ANGIOGRAPHY;  Surgeon: Kathleene Hazel, MD;  Location: [facility];  Service: Cardiovascular;  Laterality: N/A;   TRIGGER FINGER RELEASE Right    thumb    Social History   Socioeconomic History   Marital status: Divorced    Spouse name: Not on file   Number of children: 3   Years of education: Not on file   Highest education level: Not on file  Occupational History    Employer: [employer]  Tobacco Use   Smoking status: Never   Smokeless tobacco: Never  Vaping Use   Vaping status: Never Used  Substance and Sexual Activity   Alcohol use: No   Drug use: No   Sexual activity: Not Currently    Partners: Male  Other Topics Concern   Not on file  Social History Narrative   Not on file   Social Drivers  of Health   Financial Resource Strain: Not on file  Food Insecurity: Not on file  Transportation Needs: Not on file  Physical Activity: Not on file  Stress: Not on file  Social Connections: Unknown (02/05/2022)   Received from [facility], [facility]   Social Network    Social Network: Not on file  Intimate Partner Violence: Unknown (12/28/2021)   Received from [facility], [facility]   HITS    Physically Hurt: Not on file    Insult or Talk Down To: Not on file    Threaten Physical Harm: Not on file    Scream or Curse: Not on file    Outpatient Encounter Medications as of 11/20/2023  Medication Sig   acetaminophen (TYLENOL) 500 MG tablet Take 1,000 mg by mouth every 6 (six) hours as needed for mild pain or headache.    atorvastatin (LIPITOR) 80 MG tablet Take 1 tablet (80 mg total) by mouth daily.   clopidogrel (PLAVIX) 75 MG tablet Take 1 tablet (75 mg total) by mouth daily.   Continuous Glucose Sensor (DEXCOM G7 SENSOR) MISC Apply 1 sensor to the skin for continuous glucose monitoring. Change every 10 days.   gabapentin (NEURONTIN) 300 MG capsule  Take 1 capsule (300 mg total) by mouth 3 (three) times daily.   glimepiride (AMARYL) 4 MG tablet TAKE 2 TABLETS BY MOUTH ONCE DAILY WITH BREAKFAST   insulin glargine, 2 Unit Dial, (TOUJEO MAX SOLOSTAR) 300 UNIT/ML Solostar Pen Inject 32 Units into the skin daily. Increase Toujeo to 32 units daily. Check sugars every 3 days. If fasting reading is greater than 130, increase the Toujeo dose by 2 units. If sugar is 90 or less, decrease Toujeo by 2 units. Max Toujeo dose 40 units daily.   Insulin Pen Needle 31G X 5 MM MISC Use one needle with Toujeo insulin pen to inject insulin once daily.   isosorbide mononitrate (IMDUR) 30 MG 24 hr tablet TAKE 1 & 1/2 (ONE & ONE-HALF) TABLETS BY MOUTH ONCE DAILY   metoprolol succinate (TOPROL-XL) 25 MG 24 hr tablet Take 1 tablet (25 mg total) by mouth in the morning and at bedtime.   prazosin (MINIPRESS) 2 MG capsule TAKE 2 CAPSULES BY MOUTH AT BEDTIME   Semaglutide, 2 MG/DOSE, 8 MG/3ML SOPN Inject 2 mg as directed once a week.   sitaGLIPtin (JANUVIA) 100 MG tablet Take 1 tablet (100 mg total) by mouth daily.   No facility-administered encounter medications on file as of 11/20/2023.    Allergies  Allergen Reactions   Other Other (See Comments)    Pt reports steriods shoot her blood sugar up and make her go into a coma   Nitroglycerin     Blood pressure dropped, was hospitalized     Review of Systems      Objective:  BP 134/86   Pulse 72   Ht 5' (1.524 m)   Wt 184 lb 12 oz (83.8 kg)   SpO2 98%   BMI 36.08 kg/m    Wt Readings from Last 3 Encounters:  11/20/23 184 lb 12 oz (83.8 kg)  09/20/23 189 lb 1.9 oz (85.8 kg)  08/22/23 184 lb 12.8 oz (83.8 kg)    Physical Exam  Results for orders placed or performed in visit on 08/16/23  POCT HgB A1C   Collection Time: 08/16/23  8:24 AM  Result Value Ref Range   Hemoglobin A1C 9.3 (A) 4.0 - 5.6 %   HbA1c POC (<> result, manual entry)  HbA1c, POC (prediabetic range)     HbA1c, POC (controlled  diabetic range) 9.3 (A) 0.0 - 7.0 %       Pertinent labs & imaging results that were available during my care of the patient were reviewed by me and considered in my medical decision making.   Continue healthy lifestyle choices, including diet (rich in fruits, vegetables, and lean proteins, and low in salt and simple carbohydrates) and exercise (at least 30 minutes of moderate physical activity daily).  The above assessment and management plan was discussed with the patient. The patient verbalized understanding of and has agreed to the management plan. Patient is aware to call the clinic if they develop any new symptoms or if symptoms persist or worsen. Patient is aware when to return to the clinic for a follow-up visit. Patient educated on when it is appropriate to go to the emergency department.   Maryelizabeth Kaufmann Student AGNP

## 2023-11-20 NOTE — Telephone Encounter (Signed)
 Pt c/o of Chest Pain: STAT if CP now or developed within 24 hours  1. Are you having CP right now? Yes, (shoulder blade feels irritated/itchy and she stated this was happening before she had the last heart attack 2 years ago, Dr. Tenny Craw did procedure and pt stated the issue went away and now its back)  2. Are you experiencing any other symptoms (ex. SOB, nausea, vomiting, sweating)? Fatigue and some dizziness here and there but she believes that could be from BP medication possibly   3. How long have you been experiencing CP? A week and a half   4. Is your CP continuous or coming and going? Coming and going before but since Thursday continuous   5. Have you taken Nitroglycerin? No ?

## 2023-11-21 ENCOUNTER — Encounter: Payer: Self-pay | Admitting: Medical-Surgical

## 2023-11-21 LAB — CMP14+EGFR
ALT: 36 [IU]/L — ABNORMAL HIGH (ref 0–32)
AST: 25 [IU]/L (ref 0–40)
Albumin: 4.3 g/dL (ref 3.8–4.9)
Alkaline Phosphatase: 99 [IU]/L (ref 44–121)
BUN/Creatinine Ratio: 15 (ref 9–23)
BUN: 11 mg/dL (ref 6–24)
Bilirubin Total: 0.5 mg/dL (ref 0.0–1.2)
CO2: 25 mmol/L (ref 20–29)
Calcium: 9.6 mg/dL (ref 8.7–10.2)
Chloride: 100 mmol/L (ref 96–106)
Creatinine, Ser: 0.75 mg/dL (ref 0.57–1.00)
Globulin, Total: 2.2 g/dL (ref 1.5–4.5)
Glucose: 175 mg/dL — ABNORMAL HIGH (ref 70–99)
Potassium: 4.5 mmol/L (ref 3.5–5.2)
Sodium: 139 mmol/L (ref 134–144)
Total Protein: 6.5 g/dL (ref 6.0–8.5)
eGFR: 95 mL/min/{1.73_m2} (ref >59)

## 2023-11-21 LAB — CBC WITH DIFFERENTIAL/PLATELET
Basophils Absolute: 0 10*3/uL (ref 0.0–0.2)
Basos: 0 %
EOS (ABSOLUTE): 0.1 10*3/uL (ref 0.0–0.4)
Eos: 1 %
Hematocrit: 46.6 % (ref 34.0–46.6)
Hemoglobin: 15.3 g/dL (ref 11.1–15.9)
Immature Grans (Abs): 0 10*3/uL (ref 0.0–0.1)
Immature Granulocytes: 0 %
Lymphocytes Absolute: 2.2 10*3/uL (ref 0.7–3.1)
Lymphs: 39 %
MCH: 28.7 pg (ref 26.6–33.0)
MCHC: 32.8 g/dL (ref 31.5–35.7)
MCV: 87 fL (ref 79–97)
Monocytes Absolute: 0.4 10*3/uL (ref 0.1–0.9)
Monocytes: 8 %
Neutrophils Absolute: 2.9 10*3/uL (ref 1.4–7.0)
Neutrophils: 52 %
Platelets: 211 10*3/uL (ref 150–450)
RBC: 5.34 x10E6/uL — ABNORMAL HIGH (ref 3.77–5.28)
RDW: 12.9 % (ref 11.7–15.4)
WBC: 5.6 10*3/uL (ref 3.4–10.8)

## 2023-11-21 LAB — LIPID PANEL
Chol/HDL Ratio: 2.8 {ratio} (ref 0.0–4.4)
Cholesterol, Total: 124 mg/dL (ref 100–199)
HDL: 44 mg/dL (ref >39)
LDL Chol Calc (NIH): 57 mg/dL (ref 0–99)
Triglycerides: 129 mg/dL (ref 0–149)
VLDL Cholesterol Cal: 23 mg/dL (ref 5–40)

## 2023-11-21 NOTE — Progress Notes (Unsigned)
 Medical screening examination/treatment was performed by qualified clinical staff member and as supervising provider I was immediately available for consultation/collaboration. I have reviewed documentation and agree with assessment and plan.  Addendum:  In office EKG completed  Cervical radiculitis  [name], DNP, APRN, FNP-BC [facility] and [facility]

## 2023-11-23 ENCOUNTER — Other Ambulatory Visit: Payer: Self-pay | Admitting: Medical-Surgical

## 2023-11-28 ENCOUNTER — Encounter: Payer: Self-pay | Admitting: Physician Assistant

## 2023-11-28 ENCOUNTER — Ambulatory Visit: Payer: BC Managed Care – PPO | Attending: Physician Assistant | Admitting: Physician Assistant

## 2023-11-28 VITALS — BP 116/72 | HR 76 | Ht 60.0 in | Wt 181.6 lb

## 2023-11-28 DIAGNOSIS — E1165 Type 2 diabetes mellitus with hyperglycemia: Secondary | ICD-10-CM

## 2023-11-28 DIAGNOSIS — I2510 Atherosclerotic heart disease of native coronary artery without angina pectoris: Secondary | ICD-10-CM

## 2023-11-28 DIAGNOSIS — I10 Essential (primary) hypertension: Secondary | ICD-10-CM | POA: Diagnosis not present

## 2023-11-28 DIAGNOSIS — E785 Hyperlipidemia, unspecified: Secondary | ICD-10-CM

## 2023-11-28 DIAGNOSIS — R079 Chest pain, unspecified: Secondary | ICD-10-CM

## 2023-11-28 MED ORDER — ISOSORBIDE MONONITRATE ER 60 MG PO TB24: ORAL_TABLET | ORAL | 2 refills | Status: AC

## 2023-11-28 NOTE — Progress Notes (Signed)
 Cardiology Office Note:  .   Date:  11/28/2023  ID:  Sarah Kane, DOB 30-Sep-1970, MRN [id number] PCP: Christen Butter, NP  [facility] Providers Cardiologist:  Dietrich Pates, MD {   History of Present Illness: .   Sarah Kane is a 53 y.o. female who is here for a follow-up visit.  Past medical history includes coronary artery disease status post PCI/DES to RCA in 04/2020 and PCI/DES to proximal LCx 07/22/2020, hyperlipidemia, hypertension, type 2 diabetes mellitus.  Was seen in the office in November 2024.  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  She does deal with a lot of anxiety.  Admits to not eating very healthy in 2025 and would like to improve her diet.  Does drink 1 diet Coke daily with just 1 bottle of water.  Money is her biggest stressor.  Interested in looking into a job that is not night shift to improve her health.  She said Ozempic was the only thing that she taken that truly decreases her A1c.  She has been working on ways to pay for this medication as it is $125 a month for her.  Switch back to metoprolol succinate twice a day instead of once a day as she trialed this for only 1 week and her blood pressure was uncontrolled.  Does check her blood pressure at work when she feels symptomatic.  She does have mild episodes of hypotension related to Teah duration.  Denied chest pain, SOB, DOE, orthopnea, syncope, near syncope, leg swelling, dizziness, and lightheadedness.  It was recommended that she come back in a year however she called the office with chest pain on and off and she was scheduled for a follow-up appointment.  Today, she presents with a history of MI and  stent placement, and diabetes, with chest pain and discomfort in the shoulder blade area. The patient describes the shoulder blade discomfort as an annoying sensation, similar to the feeling of scratching a healing wound. This discomfort was present before her heart attack and has recently  returned, causing the patient significant concern.  The patient also reports general weakness, which she attributes to her physically demanding job at [employer]. She expresses frustration about her decreased physical capability, noting a significant decline in her strength over the past decade. The patient also mentions high levels of stress due to her work, home life, and health concerns.  The patient's diabetes management is also a concern. She is currently on Trulicity but has found Ozempic to be the only medication that effectively lowers her A1c to a 7. However, she cannot afford Ozempic. The patient expresses fear about her health, particularly her heart and diabetes, and the impact of her diet on her health.   Reports no shortness of breath nor dyspnea on exertion. Reports no chest pain, pressure, or tightness. No edema, orthopnea, PND. Reports no palpitations.   Discussed the use of AI scribe software for clinical note transcription with the patient, who gave verbal consent to proceed.  Pertinent ROS in HPI  Studies Reviewed: [name]   EKG Interpretation Date/Time:  Wednesday November 28 2023 14:49:19 EST Ventricular Rate:  76 PR Interval:  160 QRS Duration:  72 QT Interval:  380 QTC Calculation: 427 R Axis:   74  Text Interpretation: Normal sinus rhythm Normal ECG When compared with ECG of 01-Nov-2022 15:01, PREVIOUS ECG IS PRESENT Confirmed by Jari Favre [phone]) on 11/28/2023 3:04:06 PM    Echocardiogram 07/01/2021  1. Left ventricular ejection fraction, by estimation, is 60 to 65%. The  left ventricle has normal function. The left ventricle demonstrates  regional wall motion abnormalities (see scoring diagram/findings for  description). Left ventricular diastolic  parameters were normal. There is severe hypokinesis of the left  ventricular, basal-mid inferior wall and inferoseptal wall.   2. Right ventricular systolic function is normal. The right ventricular  size is normal.   3. The  mitral valve is normal in structure. No evidence of mitral valve  regurgitation. No evidence of mitral stenosis.   4. The aortic valve is normal in structure. Aortic valve regurgitation is  not visualized. No aortic stenosis is present.   5. The inferior vena cava is normal in size with greater than 50%  respiratory variability, suggesting right atrial pressure of 3 mmHg.      LHC 05/20/2020 1. Acute inferior MT/NSTEMI secondary to occlusion of the mid RCA. This is a dominant vessel.  2. Successful PTCA/DES x 2 mid and distal RCA 3. The LAD is a moderate caliber vessel that courses to the apex. The distal vessel is diabetic appearing, small in caliber and diffusely disease. Not a target for PCI.  4. 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  Diagnostic Dominance: Right  Intervention         LHC 07/14/2020 Prox Cx lesion is 70% stenosed. A drug-eluting stent was successfully placed using a SYNERGY XD 3.0X16. Post intervention, there is a 0% residual stenosis.   1. Severe stenosis proximal Circumflex artery 2. Successful PTCA/DES x 1 proximal Circumflex ntervention            Physical Exam:   VS:  BP 116/72   Pulse 76   Ht 5' (1.524 m)   Wt 181 lb 9.6 oz (82.4 kg)   SpO2 95%   BMI 35.47 kg/m    Wt Readings from Last 3 Encounters:  11/28/23 181 lb 9.6 oz (82.4 kg)  11/20/23 184 lb 12 oz (83.8 kg)  09/20/23 189 lb 1.9 oz (85.8 kg)    GEN: Well nourished, well developed in no acute distress NECK: No JVD; No carotid bruits CARDIAC: RRR, no murmurs, rubs, gallops RESPIRATORY:  Clear to auscultation without rales, wheezing or rhonchi  ABDOMEN: Soft, non-tender, non-distended EXTREMITIES:  No edema; No deformity   ASSESSMENT AND PLAN: .    Chest Pain s/p PCI in 2021 Intermittent chest pain possibly related to stress and anxiety. Atypical for cardiac origin, not activity-related. Differential includes cardiac ischemia, musculoskeletal pain, or anxiety. Concern for  stent restenosis given history. - Order cardiac catheterization  - Order echocardiogram to assess heart function. - Increase Imdur to 60 mg daily, monitor blood pressure closely.  Diabetes Mellitus Long-standing diabetes with poor glycemic control, A1c at 9.5. Ozempic effective but unaffordable, causing stress. - Continue current diabetes management. - Explore options for medication assistance programs.  Hyperlipidemia Cholesterol levels well-controlled with LDL at 57 mg/dL and triglycerides at 161 mg/dL.  Follow-up Coordination with primary care provider, Dr. Tenny Craw, is necessary for ongoing management. - Schedule follow-up appointment in 2-3 weeks to review cardiac catheterization and echocardiogram results. - Coordinate with primary care provider, Dr. Tenny Craw, regarding ongoing management.  The patient understands that risks include but are not limited to stroke (1 in 1000), death (1 in 1000), kidney failure [usually temporary] (1 in 500), bleeding (1 in 200), allergic reaction [possibly serious] (1 in 200), and agrees to proceed.        Dispo: She can follow-up  in a few weeks after her cath  Signed, Sharlene Dory, PA-C

## 2023-11-28 NOTE — H&P (View-Only) (Signed)
 Cardiology Office Note:  .   Date:  11/28/2023  ID:  Sarah Kane, DOB 30-Sep-1970, MRN [id number] PCP: Christen Butter, NP  [facility] Providers Cardiologist:  Dietrich Pates, MD {   History of Present Illness: .   Sarah Kane is a 53 y.o. female who is here for a follow-up visit.  Past medical history includes coronary artery disease status post PCI/DES to RCA in 04/2020 and PCI/DES to proximal LCx 07/22/2020, hyperlipidemia, hypertension, type 2 diabetes mellitus.  Was seen in the office in November 2024.  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  She does deal with a lot of anxiety.  Admits to not eating very healthy in 2025 and would like to improve her diet.  Does drink 1 diet Coke daily with just 1 bottle of water.  Money is her biggest stressor.  Interested in looking into a job that is not night shift to improve her health.  She said Ozempic was the only thing that she taken that truly decreases her A1c.  She has been working on ways to pay for this medication as it is $125 a month for her.  Switch back to metoprolol succinate twice a day instead of once a day as she trialed this for only 1 week and her blood pressure was uncontrolled.  Does check her blood pressure at work when she feels symptomatic.  She does have mild episodes of hypotension related to Teah duration.  Denied chest pain, SOB, DOE, orthopnea, syncope, near syncope, leg swelling, dizziness, and lightheadedness.  It was recommended that she come back in a year however she called the office with chest pain on and off and she was scheduled for a follow-up appointment.  Today, she presents with a history of MI and  stent placement, and diabetes, with chest pain and discomfort in the shoulder blade area. The patient describes the shoulder blade discomfort as an annoying sensation, similar to the feeling of scratching a healing wound. This discomfort was present before her heart attack and has recently  returned, causing the patient significant concern.  The patient also reports general weakness, which she attributes to her physically demanding job at [employer]. She expresses frustration about her decreased physical capability, noting a significant decline in her strength over the past decade. The patient also mentions high levels of stress due to her work, home life, and health concerns.  The patient's diabetes management is also a concern. She is currently on Trulicity but has found Ozempic to be the only medication that effectively lowers her A1c to a 7. However, she cannot afford Ozempic. The patient expresses fear about her health, particularly her heart and diabetes, and the impact of her diet on her health.   Reports no shortness of breath nor dyspnea on exertion. Reports no chest pain, pressure, or tightness. No edema, orthopnea, PND. Reports no palpitations.   Discussed the use of AI scribe software for clinical note transcription with the patient, who gave verbal consent to proceed.  Pertinent ROS in HPI  Studies Reviewed: [name]   EKG Interpretation Date/Time:  Wednesday November 28 2023 14:49:19 EST Ventricular Rate:  76 PR Interval:  160 QRS Duration:  72 QT Interval:  380 QTC Calculation: 427 R Axis:   74  Text Interpretation: Normal sinus rhythm Normal ECG When compared with ECG of 01-Nov-2022 15:01, PREVIOUS ECG IS PRESENT Confirmed by Jari Favre [phone]) on 11/28/2023 3:04:06 PM    Echocardiogram 07/01/2021  1. Left ventricular ejection fraction, by estimation, is 60 to 65%. The  left ventricle has normal function. The left ventricle demonstrates  regional wall motion abnormalities (see scoring diagram/findings for  description). Left ventricular diastolic  parameters were normal. There is severe hypokinesis of the left  ventricular, basal-mid inferior wall and inferoseptal wall.   2. Right ventricular systolic function is normal. The right ventricular  size is normal.   3. The  mitral valve is normal in structure. No evidence of mitral valve  regurgitation. No evidence of mitral stenosis.   4. The aortic valve is normal in structure. Aortic valve regurgitation is  not visualized. No aortic stenosis is present.   5. The inferior vena cava is normal in size with greater than 50%  respiratory variability, suggesting right atrial pressure of 3 mmHg.      LHC 05/20/2020 1. Acute inferior MT/NSTEMI secondary to occlusion of the mid RCA. This is a dominant vessel.  2. Successful PTCA/DES x 2 mid and distal RCA 3. The LAD is a moderate caliber vessel that courses to the apex. The distal vessel is diabetic appearing, small in caliber and diffusely disease. Not a target for PCI.  4. 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  Diagnostic Dominance: Right  Intervention         LHC 07/14/2020 Prox Cx lesion is 70% stenosed. A drug-eluting stent was successfully placed using a SYNERGY XD 3.0X16. Post intervention, there is a 0% residual stenosis.   1. Severe stenosis proximal Circumflex artery 2. Successful PTCA/DES x 1 proximal Circumflex ntervention            Physical Exam:   VS:  BP 116/72   Pulse 76   Ht 5' (1.524 m)   Wt 181 lb 9.6 oz (82.4 kg)   SpO2 95%   BMI 35.47 kg/m    Wt Readings from Last 3 Encounters:  11/28/23 181 lb 9.6 oz (82.4 kg)  11/20/23 184 lb 12 oz (83.8 kg)  09/20/23 189 lb 1.9 oz (85.8 kg)    GEN: Well nourished, well developed in no acute distress NECK: No JVD; No carotid bruits CARDIAC: RRR, no murmurs, rubs, gallops RESPIRATORY:  Clear to auscultation without rales, wheezing or rhonchi  ABDOMEN: Soft, non-tender, non-distended EXTREMITIES:  No edema; No deformity   ASSESSMENT AND PLAN: .    Chest Pain s/p PCI in 2021 Intermittent chest pain possibly related to stress and anxiety. Atypical for cardiac origin, not activity-related. Differential includes cardiac ischemia, musculoskeletal pain, or anxiety. Concern for  stent restenosis given history. - Order cardiac catheterization  - Order echocardiogram to assess heart function. - Increase Imdur to 60 mg daily, monitor blood pressure closely.  Diabetes Mellitus Long-standing diabetes with poor glycemic control, A1c at 9.5. Ozempic effective but unaffordable, causing stress. - Continue current diabetes management. - Explore options for medication assistance programs.  Hyperlipidemia Cholesterol levels well-controlled with LDL at 57 mg/dL and triglycerides at 161 mg/dL.  Follow-up Coordination with primary care provider, Dr. Tenny Craw, is necessary for ongoing management. - Schedule follow-up appointment in 2-3 weeks to review cardiac catheterization and echocardiogram results. - Coordinate with primary care provider, Dr. Tenny Craw, regarding ongoing management.  The patient understands that risks include but are not limited to stroke (1 in 1000), death (1 in 1000), kidney failure [usually temporary] (1 in 500), bleeding (1 in 200), allergic reaction [possibly serious] (1 in 200), and agrees to proceed.        Dispo: She can follow-up  in a few weeks after her cath  Signed, Sharlene Dory, PA-C

## 2023-11-28 NOTE — Patient Instructions (Signed)
 Medication Instructions:   START TAKING :  IMDUR 60 MG ONCE A DAY    *If you need a refill on your cardiac medications before your next appointment, please call your pharmacy*   Lab Work:  NONE ORDERED  TODAY    If you have labs (blood work) drawn today and your tests are completely normal, you will receive your results only by: MyChart Message (if you have MyChart) OR A paper copy in the mail If you have any lab test that is abnormal or we need to change your treatment, we will call you to review the results.    Testing/Procedures: ON  12-04-23 Your physician has requested that you have a cardiac catheterization. Cardiac catheterization is used to diagnose and/or treat various heart conditions. Doctors may recommend this procedure for a number of different reasons. The most common reason is to evaluate chest pain. Chest pain can be a symptom of coronary artery disease (CAD), and cardiac catheterization can show whether plaque is narrowing or blocking your heart's arteries. This procedure is also used to evaluate the valves, as well as measure the blood flow and oxygen levels in different parts of your heart. For further information please visit [email or web address] Please follow instruction sheet, as given.    Your physician has requested that you have an echocardiogram. Echocardiography is a painless test that uses sound waves to create images of your heart. It provides your doctor with information about the size and shape of your heart and how well your heart's chambers and valves are working. This procedure takes approximately one hour. There are no restrictions for this procedure. Please do NOT wear cologne, perfume, aftershave, or lotions (deodorant is allowed). Please arrive 15 minutes prior to your appointment time.  Please note: We ask at that you not bring children with you during ultrasound (echo/ vascular) testing. Due to room size and safety concerns, children are not allowed in  the ultrasound rooms during exams. Our front office staff cannot provide observation of children in our lobby area while testing is being conducted. An adult accompanying a patient to their appointment will only be allowed in the ultrasound room at the discretion of the ultrasound technician under special circumstances. We apologize for any inconvenience.     Follow-Up: At [facility], you and your health needs are our priority.  As part of our continuing mission to provide you with exceptional heart care, we have created designated Provider Care Teams.  These Care Teams include your primary Cardiologist (physician) and Advanced Practice Providers (APPs -  Physician Assistants and Nurse Practitioners) who all work together to provide you with the care you need, when you need it.  We recommend signing up for the patient portal called "MyChart".  Sign up information is provided on this After Visit Summary.  MyChart is used to connect with patients for Virtual Visits (Telemedicine).  Patients are able to view lab/test results, encounter notes, upcoming appointments, etc.  Non-urgent messages can be sent to your provider as well.   To learn more about what you can do with MyChart, go to [email or web address].    Your next appointment:  AFTER 12-04-23  POST CATH   1 month(s)  Provider:    Dietrich Pates, MD  or Jari Favre, PA-C        Other Instructions  [facility] A DEPT OF [place] AT [facility] [address] [place], [place] [address] [address] Dept: [phone] Loc:  [phone]  WINONA SISON  11/28/2023  You are scheduled for a Cardiac Catheterization on Tuesday, March 11 with Dr. Peter [place].  1. Please arrive at the [facility] (Main Entrance A) at [facility]: [address] [place], [place] [address] at 8:30 AM (This time is 2 hour(s) before your procedure to ensure your preparation).   Free valet  parking service is available. You will check in at ADMITTING. The support person will be asked to wait in the waiting room.  It is OK to have someone drop you off and come back when you are ready to be discharged.    Special note: Every effort is made to have your procedure done on time. Please understand that emergencies sometimes delay scheduled procedures.  2. Diet: Do not eat solid foods after midnight.  The patient may have clear liquids until 5am upon the day of the procedure.  3. Labs: on chart 11/20/23  4. Medication instructions in preparation for your procedure:   Contrast Allergy: No  Take only 16 units of insulin the night before your procedure. Do not take any insulin on the day of the procedure.  Do not take Diabetes Med Glimepride or Januvia on the day of the procedure and HOLD 48 HOURS AFTER THE PROCEDURE.  On the morning of your procedure, take your Plavix/Clopidogrel and any morning medicines NOT listed above.  You may use sips of water.  5. Plan to go home the same day, you will only stay overnight if medically necessary. 6. Bring a current list of your medications and current insurance cards. 7. You MUST have a responsible person to drive you home. 8. Someone MUST be with you the first 24 hours after you arrive home or your discharge will be delayed. 9. Please wear clothes that are easy to get on and off and wear slip-on shoes.  Thank you for allowing [place] to care for you!   -- [facility]

## 2023-11-29 ENCOUNTER — Ambulatory Visit (HOSPITAL_COMMUNITY): Attending: Cardiovascular Disease

## 2023-11-29 DIAGNOSIS — R079 Chest pain, unspecified: Secondary | ICD-10-CM | POA: Diagnosis not present

## 2023-11-29 LAB — ECHOCARDIOGRAM COMPLETE
Area-P 1/2: 3.79 cm2
S' Lateral: 3.3 cm

## 2023-12-03 ENCOUNTER — Telehealth: Payer: Self-pay | Admitting: *Deleted

## 2023-12-03 ENCOUNTER — Encounter: Payer: Self-pay | Admitting: Physician Assistant

## 2023-12-03 NOTE — Telephone Encounter (Signed)
 Cardiac Catheterization scheduled at [facility] for: Tuesday December 04, 2023 10:30 AM Arrival time [facility] Main Entrance A at: 8:30 AM  Nothing to eat after midnight prior to procedure, clear liquids until 5 AM day of procedure.  Medication instructions: -Hold:  Glimepiride/Januvia-AM of procedure  Insulin (Toujeo)-AM of procedure1/2 usual dose HS if taking HS  Semaglutide-weekly-ask day of week -Other usual morning medications can be taken with sips of water including aspirin 81 mg and Plavix 75 mg  Plan to go home the same day, you will only stay overnight if medically necessary.  You must have responsible adult to drive you home.  Someone must be with you the first 24 hours after you arrive home.  Left message for patient to call back to review procedure instructions.

## 2023-12-03 NOTE — Telephone Encounter (Signed)
 Left detailed voicemail message (DPR) with instructions.

## 2023-12-04 ENCOUNTER — Ambulatory Visit (HOSPITAL_COMMUNITY)
Admission: RE | Admit: 2023-12-04 | Discharge: 2023-12-04 | Disposition: A | Discharge status: Home / Self Care | Attending: Cardiology | Admitting: Cardiology

## 2023-12-04 ENCOUNTER — Encounter (HOSPITAL_COMMUNITY)
Admission: RE | Disposition: A | Discharge status: Home / Self Care | Payer: Self-pay | Source: Home / Self Care | Attending: Cardiology

## 2023-12-04 ENCOUNTER — Other Ambulatory Visit: Payer: Self-pay

## 2023-12-04 DIAGNOSIS — F419 Anxiety disorder, unspecified: Secondary | ICD-10-CM | POA: Insufficient documentation

## 2023-12-04 DIAGNOSIS — I25118 Atherosclerotic heart disease of native coronary artery with other forms of angina pectoris: Secondary | ICD-10-CM | POA: Diagnosis present

## 2023-12-04 DIAGNOSIS — E785 Hyperlipidemia, unspecified: Secondary | ICD-10-CM | POA: Diagnosis not present

## 2023-12-04 DIAGNOSIS — I252 Old myocardial infarction: Secondary | ICD-10-CM | POA: Diagnosis not present

## 2023-12-04 DIAGNOSIS — Z79899 Other long term (current) drug therapy: Secondary | ICD-10-CM | POA: Insufficient documentation

## 2023-12-04 DIAGNOSIS — E119 Type 2 diabetes mellitus without complications: Secondary | ICD-10-CM | POA: Insufficient documentation

## 2023-12-04 DIAGNOSIS — Z955 Presence of coronary angioplasty implant and graft: Secondary | ICD-10-CM | POA: Diagnosis not present

## 2023-12-04 DIAGNOSIS — Z7985 Long-term (current) use of injectable non-insulin antidiabetic drugs: Secondary | ICD-10-CM | POA: Diagnosis not present

## 2023-12-04 HISTORY — PX: LEFT HEART CATH AND CORONARY ANGIOGRAPHY: CATH118249

## 2023-12-04 LAB — GLUCOSE, CAPILLARY: Glucose-Capillary: 120 mg/dL — ABNORMAL HIGH (ref 70–99)

## 2023-12-04 SURGERY — LEFT HEART CATH AND CORONARY ANGIOGRAPHY
Anesthesia: LOCAL

## 2023-12-04 MED ORDER — SODIUM CHLORIDE 0.9% FLUSH: 3.0000 mL | Freq: Two times a day (BID) | INTRAVENOUS | Status: DC

## 2023-12-04 MED ORDER — SODIUM CHLORIDE 0.9 % IV SOLN: 250.0000 mL | INTRAVENOUS | Status: DC | PRN

## 2023-12-04 MED ORDER — HEPARIN SODIUM (PORCINE) 1000 UNIT/ML IJ SOLN
INTRAMUSCULAR | Status: AC
  Filled 2023-12-04: qty 10

## 2023-12-04 MED ORDER — HEPARIN (PORCINE) IN NACL 2-0.9 UNITS/ML
INTRAMUSCULAR | Status: DC | PRN
  Administered 2023-12-04: 10 mL via INTRA_ARTERIAL

## 2023-12-04 MED ORDER — MIDAZOLAM HCL 2 MG/2ML IJ SOLN
INTRAMUSCULAR | Status: AC
  Filled 2023-12-04: qty 2

## 2023-12-04 MED ORDER — FENTANYL CITRATE (PF) 100 MCG/2ML IJ SOLN
INTRAMUSCULAR | Status: DC | PRN
  Administered 2023-12-04 (×2): 25 ug via INTRAVENOUS

## 2023-12-04 MED ORDER — LIDOCAINE HCL (PF) 1 % IJ SOLN
INTRAMUSCULAR | Status: DC | PRN
  Administered 2023-12-04: 2 mL

## 2023-12-04 MED ORDER — LIDOCAINE HCL (PF) 1 % IJ SOLN
INTRAMUSCULAR | Status: AC
  Filled 2023-12-04: qty 30

## 2023-12-04 MED ORDER — ASPIRIN 81 MG PO CHEW
81.0000 mg | CHEWABLE_TABLET | ORAL | Status: AC
  Administered 2023-12-04: 81 mg via ORAL
  Filled 2023-12-04: qty 1

## 2023-12-04 MED ORDER — SODIUM CHLORIDE 0.9 % WEIGHT BASED INFUSION: 1.0000 mL/kg/h | INTRAVENOUS | Status: DC

## 2023-12-04 MED ORDER — CLOPIDOGREL BISULFATE 75 MG PO TABS
75.0000 mg | ORAL_TABLET | ORAL | Status: AC
  Administered 2023-12-04: 75 mg via ORAL
  Filled 2023-12-04: qty 1

## 2023-12-04 MED ORDER — VERAPAMIL HCL 2.5 MG/ML IV SOLN
INTRAVENOUS | Status: AC
Start: 2023-12-04 — End: ?
  Filled 2023-12-04: qty 2

## 2023-12-04 MED ORDER — ONDANSETRON HCL 4 MG/2ML IJ SOLN: 4.0000 mg | Freq: Four times a day (QID) | INTRAMUSCULAR | Status: DC | PRN

## 2023-12-04 MED ORDER — HEPARIN (PORCINE) IN NACL 1000-0.9 UT/500ML-% IV SOLN
INTRAVENOUS | Status: DC | PRN
  Administered 2023-12-04: 1000 mL

## 2023-12-04 MED ORDER — IOHEXOL 350 MG/ML SOLN
INTRAVENOUS | Status: DC | PRN
  Administered 2023-12-04: 30 mL

## 2023-12-04 MED ORDER — MIDAZOLAM HCL 2 MG/2ML IJ SOLN
INTRAMUSCULAR | Status: DC | PRN
  Administered 2023-12-04 (×2): 1 mg via INTRAVENOUS

## 2023-12-04 MED ORDER — ACETAMINOPHEN 325 MG PO TABS: 650.0000 mg | ORAL_TABLET | ORAL | Status: DC | PRN

## 2023-12-04 MED ORDER — SODIUM CHLORIDE 0.9 % WEIGHT BASED INFUSION: 3.0000 mL/kg/h | INTRAVENOUS | Status: AC

## 2023-12-04 MED ORDER — HEPARIN SODIUM (PORCINE) 1000 UNIT/ML IJ SOLN
INTRAMUSCULAR | Status: DC | PRN
  Administered 2023-12-04: 4500 [IU] via INTRAVENOUS

## 2023-12-04 MED ORDER — FENTANYL CITRATE (PF) 100 MCG/2ML IJ SOLN
INTRAMUSCULAR | Status: AC
  Filled 2023-12-04: qty 2

## 2023-12-04 MED ORDER — SODIUM CHLORIDE 0.9% FLUSH: 3.0000 mL | INTRAVENOUS | Status: DC | PRN

## 2023-12-04 SURGICAL SUPPLY — 9 items
CATH 5FR JL3.5 JR4 ANG PIG MP (CATHETERS) IMPLANT
DEVICE RAD COMP TR BAND LRG (VASCULAR PRODUCTS) IMPLANT
GLIDESHEATH SLEND SS 6F .021 (SHEATH) IMPLANT
GUIDEWIRE INQWIRE 1.5J.035X260 (WIRE) IMPLANT
INQWIRE 1.5J .035X260CM (WIRE) ×1 IMPLANT
KIT SINGLE USE MANIFOLD (KITS) IMPLANT
PACK CARDIAC CATHETERIZATION (CUSTOM PROCEDURE TRAY) ×2 IMPLANT
SET ATX-X65L (MISCELLANEOUS) IMPLANT
SHEATH PROBE COVER 6X72 (BAG) IMPLANT

## 2023-12-04 NOTE — Interval H&P Note (Signed)
 History and Physical Interval Note:  12/04/2023 9:41 AM  Sarah Kane  has presented today for surgery, with the diagnosis of shoulder pain - cad.  The various methods of treatment have been discussed with the patient and family. After consideration of risks, benefits and other options for treatment, the patient has consented to  Procedure(s): LEFT HEART CATH AND CORONARY ANGIOGRAPHY (N/A) as a surgical intervention.  The patient's history has been reviewed, patient examined, no change in status, stable for surgery.  I have reviewed the patient's chart and labs.  Questions were answered to the patient's satisfaction.   Cath Lab Visit (complete for each Cath Lab visit)  Clinical Evaluation Leading to the Procedure:   ACS: No.  Non-ACS:    Anginal Classification: CCS III  Anti-ischemic medical therapy: Maximal Therapy (2 or more classes of medications)  Non-Invasive Test Results: No non-invasive testing performed  Prior CABG: No previous CABG        Theron Arista [employer] 12/04/2023 9:41 AM

## 2023-12-04 NOTE — Discharge Instructions (Addendum)

## 2023-12-05 ENCOUNTER — Telehealth: Payer: Self-pay | Admitting: Internal Medicine

## 2023-12-05 ENCOUNTER — Encounter (HOSPITAL_COMMUNITY): Payer: Self-pay | Admitting: Cardiology

## 2023-12-05 NOTE — Telephone Encounter (Signed)
 Called pt reviewed results of Echocardiogram:Ms. Belcher, Your heart pump function is normal.  You have some global hypokinesis or lack of great movement in your lower left chamber of your heart which is consistent with your last echocardiogram.  Slightly impaired relaxation of the heart.  No significant valvular issues.  Overall, good results.   We will be in touch with your cardiac catheterization results once those are available.   Thanks! Sharlene Dory, PA-C

## 2023-12-05 NOTE — Telephone Encounter (Signed)
 Patient was returning phone call to talk with nurse after catherization

## 2023-12-12 ENCOUNTER — Telehealth: Payer: Self-pay

## 2023-12-12 NOTE — Progress Notes (Signed)
   12/12/2023  Patient ID: Sarah Kane, female   DOB: 1971-06-18, 53 y.o.   MRN: [id number]  Patient outreach to schedule telephone visit for medication review and access/adherence assistance.  Appointment scheduled for 3/25 at 830am.  Lenna Gilford, PharmD, DPLA

## 2023-12-14 NOTE — Addendum Note (Signed)
 Addended by: Chalmers Cater on: 12/14/2023 02:32 PM   Modules accepted: Orders

## 2023-12-17 ENCOUNTER — Encounter (HOSPITAL_COMMUNITY): Payer: Self-pay

## 2023-12-17 ENCOUNTER — Other Ambulatory Visit: Payer: Self-pay

## 2023-12-17 ENCOUNTER — Emergency Department (HOSPITAL_COMMUNITY)
Admission: EM | Admit: 2023-12-17 | Discharge: 2023-12-17 | Disposition: A | Discharge status: Home / Self Care | Attending: Emergency Medicine | Admitting: Emergency Medicine

## 2023-12-17 DIAGNOSIS — Z7902 Long term (current) use of antithrombotics/antiplatelets: Secondary | ICD-10-CM | POA: Insufficient documentation

## 2023-12-17 DIAGNOSIS — R040 Epistaxis: Secondary | ICD-10-CM | POA: Diagnosis present

## 2023-12-17 MED ORDER — OXYMETAZOLINE HCL 0.05 % NA SOLN
1.0000 | Freq: Once | NASAL | Status: AC
  Administered 2023-12-17: 1 via NASAL
  Filled 2023-12-17: qty 30

## 2023-12-17 NOTE — ED Provider Notes (Signed)
 [place] EMERGENCY DEPARTMENT AT [facility] Provider Note   CSN: [id number] Arrival date & time: 12/17/23  0544     History  Chief Complaint  Patient presents with   Epistaxis    Sarah Kane is a 53 y.o. female.  The history is provided by the patient.  Patient reports she had epistaxis tonight while at work.  She reports that she has had intermittent nosebleeds since she had a cardiac cath earlier in the month.  She is on Plavix.  No recent trauma or previous surgery.  Tonight's episode started abruptly at work and she held pressure and is now resolved.   No weakness or dizziness.  No pain complaints at this time  Home Medications Prior to Admission medications   Medication Sig Start Date End Date Taking? Authorizing Provider  acetaminophen (TYLENOL) 500 MG tablet Take 1,000 mg by mouth every 6 (six) hours as needed for mild pain or headache.     [provider]  atorvastatin (LIPITOR) 80 MG tablet Take 1 tablet (80 mg total) by mouth daily. 08/31/23   Pricilla Riffle, MD  clopidogrel (PLAVIX) 75 MG tablet Take 1 tablet (75 mg total) by mouth daily. 08/31/23   Pricilla Riffle, MD  Continuous Glucose Sensor (DEXCOM G7 SENSOR) MISC Apply 1 sensor to the skin for continuous glucose monitoring. Change every 10 days. 08/16/23   Christen Butter, NP  gabapentin (NEURONTIN) 300 MG capsule TAKE 1 CAPSULE BY MOUTH THREE TIMES DAILY 11/27/23   Christen Butter, NP  glimepiride (AMARYL) 4 MG tablet TAKE 2 TABLETS BY MOUTH ONCE DAILY WITH BREAKFAST 09/24/23   Jessup, Joy, NP  insulin glargine, 2 Unit Dial, (TOUJEO MAX SOLOSTAR) 300 UNIT/ML Solostar Pen Inject 32 Units into the skin daily. Increase Toujeo to 32 units daily. Check sugars every 3 days. If fasting reading is greater than 130, increase the Toujeo dose by 2 units. If sugar is 90 or less, decrease Toujeo by 2 units. Max Toujeo dose 40 units daily. 09/20/23   Christen Butter, NP  Insulin Pen Needle 31G X 5 MM MISC Use one needle with  Toujeo insulin pen to inject insulin once daily. 08/15/22   Christen Butter, NP  isosorbide mononitrate (IMDUR) 60 MG 24 hr tablet TAKE 1  TABLET BY MOUTH ONCE DAILY 11/28/23   Sharlene Dory, PA-C  metoprolol succinate (TOPROL-XL) 25 MG 24 hr tablet Take 1 tablet (25 mg total) by mouth in the morning and at bedtime. 08/31/23   Pricilla Riffle, MD  prazosin (MINIPRESS) 2 MG capsule TAKE 2 CAPSULES BY MOUTH AT BEDTIME 11/07/23   Christen Butter, NP  Semaglutide, 2 MG/DOSE, 8 MG/3ML SOPN Inject 2 mg as directed once a week. Patient taking differently: Inject 0.5 mg as directed once a week. 09/20/23   Christen Butter, NP  sitaGLIPtin (JANUVIA) 100 MG tablet Take 1 tablet (100 mg total) by mouth daily. 04/16/23   Christen Butter, NP      Allergies    Other and Nitroglycerin    Review of Systems   Review of Systems  HENT:  Positive for nosebleeds.   Neurological:  Negative for dizziness.    Physical Exam Updated Vital Signs BP (!) 114/99   Pulse 81   Temp 97.9 F (36.6 C) (Oral)   Resp 14   SpO2 100%  Physical Exam CONSTITUTIONAL: Well developed/well nourished HEAD: Normocephalic/atraumatic EYES: EOMI/PERRL ENMT: Mucous membranes moist There is no blood noted in either nare.  No blood in  oropharynx No stridor or drooling NEURO: Pt is awake/alert/appropriate, moves all extremitiesx4.  No facial droop.    ED Results / Procedures / Treatments   Labs (all labs ordered are listed, but only abnormal results are displayed) Labs Reviewed - No data to display  EKG None  Radiology No results found.  Procedures Procedures    Medications Ordered in ED Medications  oxymetazoline (AFRIN) 0.05 % nasal spray 1 spray (1 spray Each Nare Given 12/17/23 0620)    ED Course/ Medical Decision Making/ A&P                                 Medical Decision Making Risk OTC drugs.   Patient with intermittent epistaxis over the past several weeks.  No active bleeding at this time.  No indication for further  workup treatment. Will provide Afrin, referral to otolaryngology       Final Clinical Impression(s) / ED Diagnoses Final diagnoses:  Left-sided epistaxis    Rx / DC Orders ED Discharge Orders     None         Zadie Rhine, MD 12/17/23 [phone]

## 2023-12-17 NOTE — ED Triage Notes (Signed)
 Patient coming from work with chief complaint of nose bleed. This episode started around 0100 and had been off and on with clots. Has been experiencing nosebleeds since the 3/11. Heart stents were also checked on 3/11. Upon EMS arrival to the scene patient was not actively having a nosebleed. Not currently bleeding. Patient is on Plavix. EMS VSS

## 2023-12-18 ENCOUNTER — Other Ambulatory Visit: Payer: Self-pay

## 2023-12-18 NOTE — Progress Notes (Unsigned)
   12/18/2023  Patient ID: Sarah Kane, female   DOB: [birth year], 54 y.o.   MRN: [id number]  Outreach for scheduled telephone visit unsuccessful.  Tried to call x3, and "call could not be completed as dialed."  Sending MyChart message to reschedule visit, and will call patient again next week if I do not hear back.  Lenna Gilford, PharmD, DPLA

## 2023-12-20 ENCOUNTER — Institutional Professional Consult (permissible substitution) (INDEPENDENT_AMBULATORY_CARE_PROVIDER_SITE_OTHER)

## 2023-12-21 ENCOUNTER — Institutional Professional Consult (permissible substitution) (INDEPENDENT_AMBULATORY_CARE_PROVIDER_SITE_OTHER)

## 2023-12-30 ENCOUNTER — Other Ambulatory Visit: Payer: Self-pay | Admitting: Medical-Surgical

## 2023-12-31 ENCOUNTER — Ambulatory Visit (INDEPENDENT_AMBULATORY_CARE_PROVIDER_SITE_OTHER)

## 2023-12-31 VITALS — BP 108/72 | HR 82 | Ht 60.0 in | Wt 181.0 lb

## 2023-12-31 DIAGNOSIS — R040 Epistaxis: Secondary | ICD-10-CM | POA: Diagnosis not present

## 2023-12-31 NOTE — Progress Notes (Deleted)
 Cardiology Office Note:  .   Date:  12/31/2023  ID:  Sarah Kane, DOB 06/14/1971, MRN [id number] PCP: Christen Butter, NP  [facility] Providers Cardiologist:  Dietrich Pates, MD {   History of Present Illness: .   Sarah Kane is a 53 y.o. female who is here for a follow-up visit.  Past medical history includes coronary artery disease status post PCI/DES to RCA in 04/2020 and PCI/DES to proximal LCx 07/22/2020, hyperlipidemia, hypertension, type 2 diabetes mellitus.  Was seen in the office in November 2024.  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  She does deal with a lot of anxiety.  Admits to not eating very healthy in 2025 and would like to improve her diet.  Does drink 1 diet Coke daily with just 1 bottle of water.  Money is her biggest stressor.  Interested in looking into a job that is not night shift to improve her health.  She said Ozempic was the only thing that she taken that truly decreases her A1c.  She has been working on ways to pay for this medication as it is $125 a month for her.  Switch back to metoprolol succinate twice a day instead of once a day as she trialed this for only 1 week and her blood pressure was uncontrolled.  Does check her blood pressure at work when she feels symptomatic.  She does have mild episodes of hypotension related to Teah duration.  Denied chest pain, SOB, DOE, orthopnea, syncope, near syncope, leg swelling, dizziness, and lightheadedness.  It was recommended that she come back in a year however she called the office with chest pain on and off and she was scheduled for a follow-up appointment.  She saw me 3/5, she presents with a history of MI and  stent placement, and diabetes, with chest pain and discomfort in the shoulder blade area. The patient describes the shoulder blade discomfort as an annoying sensation, similar to the feeling of scratching a healing wound. This discomfort was present before her heart attack and has  recently returned, causing the patient significant concern.  The patient also reports general weakness, which she attributes to her physically demanding job at [employer]. She expresses frustration about her decreased physical capability, noting a significant decline in her strength over the past decade. The patient also mentions high levels of stress due to her work, home life, and health concerns.  The patient's diabetes management is also a concern. She is currently on Trulicity but has found Ozempic to be the only medication that effectively lowers her A1c to a 7. However, she cannot afford Ozempic. The patient expresses fear about her health, particularly her heart and diabetes, and the impact of her diet on her health.   Reports no shortness of breath nor dyspnea on exertion. Reports no chest pain, pressure, or tightness. No edema, orthopnea, PND. Reports no palpitations.   Discussed the use of AI scribe software for clinical note transcription with the patient, who gave verbal consent to proceed.  Today, she ***    Pertinent ROS in HPI  Studies Reviewed: .        Echocardiogram 07/01/2021 1. Left ventricular ejection fraction, by estimation, is 60 to 65%. The  left ventricle has normal function. The left ventricle demonstrates  regional wall motion abnormalities (see scoring diagram/findings for  description). Left ventricular diastolic  parameters were normal. There is severe hypokinesis of the left  ventricular, basal-mid inferior  wall and inferoseptal wall.   2. Right ventricular systolic function is normal. The right ventricular  size is normal.   3. The mitral valve is normal in structure. No evidence of mitral valve  regurgitation. No evidence of mitral stenosis.   4. The aortic valve is normal in structure. Aortic valve regurgitation is  not visualized. No aortic stenosis is present.   5. The inferior vena cava is normal in size with greater than 50%  respiratory  variability, suggesting right atrial pressure of 3 mmHg.      LHC 05/20/2020 1. Acute inferior MT/NSTEMI secondary to occlusion of the mid RCA. This is a dominant vessel.  2. Successful PTCA/DES x 2 mid and distal RCA 3. The LAD is a moderate caliber vessel that courses to the apex. The distal vessel is diabetic appearing, small in caliber and diffusely disease. Not a target for PCI.  4. 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  Diagnostic Dominance: Right  Intervention         LHC 07/14/2020 Prox Cx lesion is 70% stenosed. A drug-eluting stent was successfully placed using a SYNERGY XD 3.0X16. Post intervention, there is a 0% residual stenosis.   1. Severe stenosis proximal Circumflex artery 2. Successful PTCA/DES x 1 proximal Circumflex ntervention            Physical Exam:   VS:  There were no vitals taken for this visit.   Wt Readings from Last 3 Encounters:  12/31/23 181 lb (82.1 kg)  12/04/23 240 lb (108.9 kg)  11/28/23 181 lb 9.6 oz (82.4 kg)    GEN: Well nourished, well developed in no acute distress NECK: No JVD; No carotid bruits CARDIAC: RRR, no murmurs, rubs, gallops RESPIRATORY:  Clear to auscultation without rales, wheezing or rhonchi  ABDOMEN: Soft, non-tender, non-distended EXTREMITIES:  No edema; No deformity   ASSESSMENT AND PLAN: .    Chest Pain s/p PCI in 2021 Intermittent chest pain possibly related to stress and anxiety. Atypical for cardiac origin, not activity-related. Differential includes cardiac ischemia, musculoskeletal pain, or anxiety. Concern for stent restenosis given history. - Order cardiac catheterization  - Order echocardiogram to assess heart function. - Increase Imdur to 60 mg daily, monitor blood pressure closely.  Diabetes Mellitus Long-standing diabetes with poor glycemic control, A1c at 9.5. Ozempic effective but unaffordable, causing stress. - Continue current diabetes management. - Explore options for medication  assistance programs.  Hyperlipidemia Cholesterol levels well-controlled with LDL at 57 mg/dL and triglycerides at 244 mg/dL.  Follow-up Coordination with primary care provider, Dr. Tenny Craw, is necessary for ongoing management. - Schedule follow-up appointment in 2-3 weeks to review cardiac catheterization and echocardiogram results. - Coordinate with primary care provider, Dr. Tenny Craw, regarding ongoing management.       Dispo: She can follow-up in a few weeks after her cath  Signed, Sharlene Dory, PA-C

## 2023-12-31 NOTE — Progress Notes (Unsigned)
 CC: Recurrent epistaxis  HPI:  Sarah Kane is a 53 y.o. female  Past Medical History:  Diagnosis Date   Arthritis    Coronary artery disease    COVID-19 05/2020   Diabetes mellitus without complication (HCC)    Diabetic retinopathy (HCC)    Hyperlipidemia    Hypertension    Hypertensive retinopathy    Myocardial infarct, old     Past Surgical History:  Procedure Laterality Date   CARDIAC CATHETERIZATION     CESAREAN SECTION  1990   CORONARY STENT INTERVENTION N/A 05/20/2020   Procedure: CORONARY STENT INTERVENTION;  Surgeon: Kathleene Hazel, MD;  Location: [facility];  Service: Cardiovascular;  Laterality: N/A;   CORONARY STENT INTERVENTION N/A 07/22/2020   Procedure: CORONARY STENT INTERVENTION;  Surgeon: Kathleene Hazel, MD;  Location: [facility];  Service: Cardiovascular;  Laterality: N/A;   LEFT HEART CATH AND CORONARY ANGIOGRAPHY N/A 05/20/2020   Procedure: LEFT HEART CATH AND CORONARY ANGIOGRAPHY;  Surgeon: Kathleene Hazel, MD;  Location: [facility];  Service: Cardiovascular;  Laterality: N/A;   LEFT HEART CATH AND CORONARY ANGIOGRAPHY N/A 12/04/2023   Procedure: LEFT HEART CATH AND CORONARY ANGIOGRAPHY;  Surgeon: Swaziland, Peter M, MD;  Location: [facility] INVASIVE CV LAB;  Service: Cardiovascular;  Laterality: N/A;   TRIGGER FINGER RELEASE Right    thumb    Family History  Problem Relation Age of Onset   High blood pressure Mother    Diabetes Mother    Diabetes Maternal Grandmother    High blood pressure Maternal Grandmother    Heart attack Maternal Grandfather    Diabetes Maternal Grandfather    High blood pressure Maternal Grandfather    High blood pressure Paternal Grandmother    High blood pressure Paternal Grandfather    Diabetes Maternal Aunt    Diabetes Maternal Uncle    Skin cancer Maternal Uncle     Social History:  reports that she has never smoked. She has never used smokeless tobacco. She reports that she does  not drink alcohol and does not use drugs.  Allergies:  Allergies  Allergen Reactions   Other Other (See Comments)    Pt reports steriods shoot her blood sugar up and make her go into a coma   Nitroglycerin     Blood pressure dropped, was hospitalized     Prior to Admission medications   Medication Sig Start Date End Date Taking? Authorizing Provider  acetaminophen (TYLENOL) 500 MG tablet Take 1,000 mg by mouth every 6 (six) hours as needed for mild pain or headache.    Yes [provider]  atorvastatin (LIPITOR) 80 MG tablet Take 1 tablet (80 mg total) by mouth daily. 08/31/23  Yes Pricilla Riffle, MD  clopidogrel (PLAVIX) 75 MG tablet Take 1 tablet (75 mg total) by mouth daily. 08/31/23  Yes Pricilla Riffle, MD  Continuous Glucose Sensor (DEXCOM G7 SENSOR) MISC Apply 1 sensor to the skin for continuous glucose monitoring. Change every 10 days. 08/16/23  Yes Jessup, Ander Slade, NP  gabapentin (NEURONTIN) 300 MG capsule TAKE 1 CAPSULE BY MOUTH THREE TIMES DAILY 12/31/23  Yes Jessup, Joy, NP  glimepiride (AMARYL) 4 MG tablet TAKE 2 TABLETS BY MOUTH ONCE DAILY WITH BREAKFAST 09/24/23  Yes Jessup, Joy, NP  insulin glargine, 2 Unit Dial, (TOUJEO MAX SOLOSTAR) 300 UNIT/ML Solostar Pen Inject 32 Units into the skin daily. Increase Toujeo to 32 units daily. Check sugars every 3 days. If fasting reading is  greater than 130, increase the Toujeo dose by 2 units. If sugar is 90 or less, decrease Toujeo by 2 units. Max Toujeo dose 40 units daily. 09/20/23  Yes Christen Butter, NP  Insulin Pen Needle 31G X 5 MM MISC Use one needle with Toujeo insulin pen to inject insulin once daily. 08/15/22  Yes Christen Butter, NP  isosorbide mononitrate (IMDUR) 60 MG 24 hr tablet TAKE 1  TABLET BY MOUTH ONCE DAILY 11/28/23  Yes Asa Lente, Tessa N, PA-C  metoprolol succinate (TOPROL-XL) 25 MG 24 hr tablet Take 1 tablet (25 mg total) by mouth in the morning and at bedtime. 08/31/23  Yes Pricilla Riffle, MD  prazosin (MINIPRESS) 2 MG capsule  TAKE 2 CAPSULES BY MOUTH AT BEDTIME 11/07/23  Yes Jessup, Joy, NP  Semaglutide, 2 MG/DOSE, 8 MG/3ML SOPN Inject 2 mg as directed once a week. Patient taking differently: Inject 0.5 mg as directed once a week. 09/20/23  Yes Christen Butter, NP  sitaGLIPtin (JANUVIA) 100 MG tablet Take 1 tablet (100 mg total) by mouth daily. 04/16/23  Yes Christen Butter, NP    Blood pressure 108/72, pulse 82, height 5' (1.524 m), weight 181 lb (82.1 kg), SpO2 92%. Exam: General: Communicates without difficulty, well nourished, no acute distress. Head: Normocephalic, no evidence injury, no tenderness, facial buttresses intact without stepoff. Face/sinus: No tenderness to palpation and percussion. Facial movement is normal and symmetric. Eyes: PERRL, EOMI. No scleral icterus, conjunctivae clear. Neuro: CN II exam reveals vision grossly intact.  No nystagmus at any point of gaze. Ears: Auricles well formed without lesions.  Ear canals are intact without mass or lesion.  No erythema or edema is appreciated.  The TMs are intact without fluid. Nose: External evaluation reveals normal support and skin without lesions.  Dorsum is intact.  Anterior rhinoscopy reveals congested mucosa over anterior aspect of inferior turbinates and intact septum.  No purulence noted. Oral:  Oral cavity and oropharynx are intact, symmetric, without erythema or edema.  Mucosa is moist without lesions. Neck: Full range of motion without pain.  There is no significant lymphadenopathy.  No masses palpable.  Thyroid bed within normal limits to palpation.  Parotid glands and submandibular glands equal bilaterally without mass.  Trachea is midline. Neuro:  CN 2-12 grossly intact.   Assessment:   Plan:   Kalik Hoare W Jassen Sarver 12/31/2023, 4:10 PM

## 2024-01-01 ENCOUNTER — Ambulatory Visit: Admitting: Physician Assistant

## 2024-01-01 ENCOUNTER — Telehealth: Payer: Self-pay

## 2024-01-01 DIAGNOSIS — E1165 Type 2 diabetes mellitus with hyperglycemia: Secondary | ICD-10-CM

## 2024-01-01 DIAGNOSIS — R040 Epistaxis: Secondary | ICD-10-CM | POA: Insufficient documentation

## 2024-01-01 DIAGNOSIS — I2510 Atherosclerotic heart disease of native coronary artery without angina pectoris: Secondary | ICD-10-CM

## 2024-01-01 DIAGNOSIS — I10 Essential (primary) hypertension: Secondary | ICD-10-CM

## 2024-01-01 DIAGNOSIS — R079 Chest pain, unspecified: Secondary | ICD-10-CM

## 2024-01-01 DIAGNOSIS — E785 Hyperlipidemia, unspecified: Secondary | ICD-10-CM

## 2024-01-01 NOTE — Progress Notes (Signed)
   01/01/2024  Patient ID: Sarah Kane, female   DOB: [birth year], 53 y.o.   MRN: [id number]  Patient outreach to attempt to reschedule recently missed telephone visit to assist with medication access/adherence.  I was not able to reach the patient, but HIPAA compliant voicemail with my direct phone number was left.  Lenna Gilford, PharmD, DPLA

## 2024-01-21 ENCOUNTER — Encounter: Payer: Self-pay | Admitting: Emergency Medicine

## 2024-01-21 ENCOUNTER — Ambulatory Visit
Admission: EM | Admit: 2024-01-21 | Discharge: 2024-01-21 | Disposition: A | Discharge status: Home / Self Care | Attending: Family Medicine | Admitting: Family Medicine

## 2024-01-21 ENCOUNTER — Other Ambulatory Visit: Payer: Self-pay | Admitting: Medical-Surgical

## 2024-01-21 DIAGNOSIS — L0201 Cutaneous abscess of face: Secondary | ICD-10-CM | POA: Diagnosis not present

## 2024-01-21 DIAGNOSIS — E1165 Type 2 diabetes mellitus with hyperglycemia: Secondary | ICD-10-CM

## 2024-01-21 DIAGNOSIS — R220 Localized swelling, mass and lump, head: Secondary | ICD-10-CM

## 2024-01-21 MED ORDER — PREDNISONE 20 MG PO TABS: ORAL_TABLET | ORAL | 0 refills | Status: DC

## 2024-01-21 MED ORDER — METHYLPREDNISOLONE ACETATE 80 MG/ML IJ SUSP
80.0000 mg | Freq: Once | INTRAMUSCULAR | Status: AC
  Administered 2024-01-21: 80 mg via INTRAMUSCULAR

## 2024-01-21 MED ORDER — DOXYCYCLINE HYCLATE 100 MG PO CAPS: 100.0000 mg | ORAL_CAPSULE | Freq: Two times a day (BID) | ORAL | 0 refills | Status: AC

## 2024-01-21 NOTE — Discharge Instructions (Addendum)
 Advised patient to take medication as directed with food to completion.  Advised patient to start prednisone  tomorrow Tuesday, 01/22/2024 with first dose of doxycycline until complete.  Encouraged to increase daily water intake to 64 ounces per day while taking these medications.  Advised if symptoms worsen and/or unresolved please follow with your PCP or here for further evaluation.

## 2024-01-21 NOTE — ED Provider Notes (Signed)
 [facility]    CSN: [id number] Arrival date & time: 01/21/24  0802      History   Chief Complaint Chief Complaint  Patient presents with   Facial Swelling    HPI Sarah Kane is a 53 y.o. female.   HPI pleasant 53 year old female presents with facial/mouth swelling for 3 days.  Patient reports daughter pulled facial hair from face which has now become red swollen and painful.  PMH significant for CAD (s/p MI) , ischemic cardiomyopathy, T2DM, and HTN.  Patient is currently on Plavix  and denies any unusual bleeding  Past Medical History:  Diagnosis Date   Arthritis    Coronary artery disease    COVID-19 05/2020   Diabetes mellitus without complication (HCC)    Diabetic retinopathy (HCC)    Hyperlipidemia    Hypertension    Hypertensive retinopathy    Myocardial infarct, old     Patient Active Problem List   Diagnosis Date Noted   Epistaxis 01/01/2024   Moderate food insecurity 11/20/2023   Chest pain 11/20/2023   Rheumatoid arthritis involving right hand, unspecified whether rheumatoid factor present (HCC) 08/16/2023   Severe episode of recurrent major depressive disorder, without psychotic features (HCC) 08/16/2023   Proliferative diabetic retinopathy of left eye with macular edema associated with type 2 diabetes mellitus (HCC) 08/16/2023   Moderate nonproliferative diabetic retinopathy of right eye with macular edema associated with type 2 diabetes mellitus (HCC) 08/16/2023   Nightmares 05/17/2023   Unstable angina (HCC)    Anxiety with depression 06/10/2020   Hyperlipidemia LDL goal <70 05/22/2020   CAD (coronary artery disease) 05/22/2020   Ischemic cardiomyopathy 05/22/2020   NSTEMI (non-ST elevated myocardial infarction) (HCC) 05/20/2020   Type 2 diabetes mellitus with hyperglycemia, without long-term current use of insulin  (HCC) 10/23/2019   Essential hypertension 10/29/2018   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   Dupuytren's  contracture of both hands 07/26/2018   Chronic pain 06/28/2018   Arthritis of left acromioclavicular joint 10/03/2017   Carpal tunnel syndrome 03/06/2017   Complex regional pain syndrome 06/16/2016    Past Surgical History:  Procedure Laterality Date   CARDIAC CATHETERIZATION     CESAREAN SECTION  1990   CORONARY STENT INTERVENTION N/A 05/20/2020   Procedure: CORONARY STENT INTERVENTION;  Surgeon: Odie Benne, MD;  Location: [facility];  Service: Cardiovascular;  Laterality: N/A;   CORONARY STENT INTERVENTION N/A 07/22/2020   Procedure: CORONARY STENT INTERVENTION;  Surgeon: Odie Benne, MD;  Location: [facility];  Service: Cardiovascular;  Laterality: N/A;   LEFT HEART CATH AND CORONARY ANGIOGRAPHY N/A 05/20/2020   Procedure: LEFT HEART CATH AND CORONARY ANGIOGRAPHY;  Surgeon: Odie Benne, MD;  Location: [facility];  Service: Cardiovascular;  Laterality: N/A;   LEFT HEART CATH AND CORONARY ANGIOGRAPHY N/A 12/04/2023   Procedure: LEFT HEART CATH AND CORONARY ANGIOGRAPHY;  Surgeon: Swaziland, Peter M, MD;  Location: [facility] INVASIVE CV LAB;  Service: Cardiovascular;  Laterality: N/A;   TRIGGER FINGER RELEASE Right    thumb    OB History   No obstetric history on file.      Home Medications    Prior to Admission medications   Medication Sig Start Date End Date Taking? Authorizing Provider  acetaminophen  (TYLENOL ) 500 MG tablet Take 1,000 mg by mouth every 6 (six) hours as needed for mild pain or headache.    Yes [provider]  atorvastatin  (LIPITOR ) 80 MG tablet Take 1 tablet (  80 mg total) by mouth daily. 08/31/23  Yes Elmyra Haggard, MD  clopidogrel  (PLAVIX ) 75 MG tablet Take 1 tablet (75 mg total) by mouth daily. 08/31/23  Yes Elmyra Haggard, MD  Continuous Glucose Sensor (DEXCOM G7 SENSOR) MISC Apply 1 sensor to the skin for continuous glucose monitoring. Change every 10 days. 08/16/23  Yes Cherre Cornish, NP  doxycycline  (VIBRAMYCIN) 100 MG capsule Take 1 capsule (100 mg total) by mouth 2 (two) times daily for 10 days. 01/21/24 01/31/24 Yes Leonides Ramp, FNP  gabapentin  (NEURONTIN ) 300 MG capsule TAKE 1 CAPSULE BY MOUTH THREE TIMES DAILY 12/31/23  Yes Cherre Cornish, NP  glimepiride  (AMARYL ) 4 MG tablet TAKE 2 TABLETS BY MOUTH ONCE DAILY WITH BREAKFAST 09/24/23  Yes Cherre Cornish, NP  insulin  glargine, 2 Unit Dial, (TOUJEO  MAX SOLOSTAR) 300 UNIT/ML Solostar Pen Inject 32 Units into the skin daily. Increase Toujeo  to 32 units daily. Check sugars every 3 days. If fasting reading is greater than 130, increase the Toujeo  dose by 2 units. If sugar is 90 or less, decrease Toujeo  by 2 units. Max Toujeo  dose 40 units daily. 09/20/23  Yes Cherre Cornish, NP  Insulin  Pen Needle 31G X 5 MM MISC Use one needle with Toujeo  insulin  pen to inject insulin  once daily. 08/15/22  Yes Cherre Cornish, NP  isosorbide  mononitrate (IMDUR ) 60 MG 24 hr tablet TAKE 1  TABLET BY MOUTH ONCE DAILY 11/28/23  Yes Rueben Cote, Tessa N, PA-C  metoprolol  succinate (TOPROL -XL) 25 MG 24 hr tablet Take 1 tablet (25 mg total) by mouth in the morning and at bedtime. 08/31/23  Yes Elmyra Haggard, MD  prazosin  (MINIPRESS ) 2 MG capsule TAKE 2 CAPSULES BY MOUTH AT BEDTIME 11/07/23  Yes Cherre Cornish, NP  predniSONE  (DELTASONE ) 20 MG tablet Take 3 tabs PO daily x 5 days. 01/21/24  Yes Leonides Ramp, FNP  Semaglutide , 2 MG/DOSE, 8 MG/3ML SOPN Inject 2 mg as directed once a week. Patient taking differently: Inject 0.5 mg as directed once a week. 09/20/23  Yes Cherre Cornish, NP  sitaGLIPtin  (JANUVIA ) 100 MG tablet Take 1 tablet (100 mg total) by mouth daily. 04/16/23  Yes Cherre Cornish, NP    Family History Family History  Problem Relation Age of Onset   High blood pressure Mother    Diabetes Mother    Diabetes Maternal Grandmother    High blood pressure Maternal Grandmother    Heart attack Maternal Grandfather    Diabetes Maternal Grandfather    High blood pressure Maternal Grandfather     High blood pressure Paternal Grandmother    High blood pressure Paternal Grandfather    Diabetes Maternal Aunt    Diabetes Maternal Uncle    Skin cancer Maternal Uncle     Social History Social History   Tobacco Use   Smoking status: Never   Smokeless tobacco: Never  Vaping Use   Vaping status: Never Used  Substance Use Topics   Alcohol use: No   Drug use: No     Allergies   Other and Nitroglycerin    Review of Systems Review of Systems  HENT:  Positive for facial swelling.      Physical Exam Triage Vital Signs ED Triage Vitals  Encounter Vitals Group     BP 01/21/24 0834 133/85     Systolic BP Percentile --      Diastolic BP Percentile --      Pulse Rate 01/21/24 0834 87     Resp 01/21/24 0834 18  Temp 01/21/24 0834 98.5 F (36.9 C)     Temp Source 01/21/24 0834 Oral     SpO2 01/21/24 0834 96 %     Weight 01/21/24 0836 185 lb (83.9 kg)     Height 01/21/24 0836 5' (1.524 m)     Head Circumference --      Peak Flow --      Pain Score 01/21/24 0836 10     Pain Loc --      Pain Education --      Exclude from Growth Chart --    No data found.  Updated Vital Signs BP 133/85 (BP Location: Right Arm)   Pulse 87   Temp 98.5 F (36.9 C) (Oral)   Resp 18   Ht 5' (1.524 m)   Wt 185 lb (83.9 kg)   SpO2 96%   BMI 36.13 kg/m    Physical Exam Vitals and nursing note reviewed.  Constitutional:      Appearance: Normal appearance. She is obese. She is ill-appearing.  HENT:     Head: Normocephalic and atraumatic.     Right Ear: Tympanic membrane, ear canal and external ear normal.     Left Ear: Tympanic membrane, ear canal and external ear normal.     Mouth/Throat:     Mouth: Mucous membranes are moist.     Pharynx: Oropharynx is clear.  Eyes:     Extraocular Movements: Extraocular movements intact.     Conjunctiva/sclera: Conjunctivae normal.     Pupils: Pupils are equal, round, and reactive to light.  Cardiovascular:     Rate and Rhythm: Normal  rate and regular rhythm.     Pulses: Normal pulses.     Heart sounds: Normal heart sounds.  Pulmonary:     Effort: Pulmonary effort is normal.     Breath sounds: Normal breath sounds. No wheezing, rhonchi or rales.  Musculoskeletal:        General: Normal range of motion.     Cervical back: Normal range of motion and neck supple.  Skin:    General: Skin is warm and dry.     Comments: Face left-sided adjacent to upper and lower lips: Erythematous maculopapular eruption with central comedone noted-please see image below  Neurological:     General: No focal deficit present.     Mental Status: She is alert and oriented to person, place, and time. Mental status is at baseline.  Psychiatric:        Mood and Affect: Mood normal.        Behavior: Behavior normal.      UC Treatments / Results  Labs (all labs ordered are listed, but only abnormal results are displayed) Labs Reviewed - No data to display  EKG   Radiology No results found.  Procedures Procedures (including critical care time)  Medications Ordered in UC Medications  methylPREDNISolone  acetate (DEPO-MEDROL ) injection 80 mg (80 mg Intramuscular Given 01/21/24 0902)    Initial Impression / Assessment and Plan / UC Course  I have reviewed the triage vital signs and the nursing notes.  Pertinent labs & imaging results that were available during my care of the patient were reviewed by me and considered in my medical decision making (see chart for details).     MDM: 1.  Facial abscess-Rx'd doxycycline 100 mg capsule: Take 1 capsule twice daily x 10 days; 2.  Left facial swelling-IM Depo-Medrol  80 mg given once in clinic and prior to discharge, Rx'd prednisone  20 mg capsule: Take  3 capsules daily x 5 days. Advised patient to take medication as directed with food to completion.  Advised patient to start prednisone  tomorrow Tuesday, 01/22/2024 with first dose of doxycycline until complete.  Encouraged to increase daily water  intake to 64 ounces per day while taking these medications.  Advised if symptoms worsen and/or unresolved please follow with your PCP or here for further evaluation.  Patient discharged home, hemodynamically stable. Final Clinical Impressions(s) / UC Diagnoses   Final diagnoses:  Facial abscess  Left facial swelling     Discharge Instructions      Advised patient to take medication as directed with food to completion.  Advised patient to start prednisone  tomorrow Tuesday, 01/22/2024 with first dose of doxycycline until complete.  Encouraged to increase daily water intake to 64 ounces per day while taking these medications.  Advised if symptoms worsen and/or unresolved please follow with your PCP or here for further evaluation.     ED Prescriptions     Medication Sig Dispense Auth. Provider   doxycycline (VIBRAMYCIN) 100 MG capsule Take 1 capsule (100 mg total) by mouth 2 (two) times daily for 10 days. 20 capsule Shamieka Gullo, FNP   predniSONE  (DELTASONE ) 20 MG tablet Take 3 tabs PO daily x 5 days. 15 tablet Bunnie Lederman, FNP      PDMP not reviewed this encounter.   Leonides Ramp, FNP 01/21/24 [phone]

## 2024-01-21 NOTE — ED Triage Notes (Signed)
 Patient c/o left sided face/mouth swelling x 3 days.  Patient states her daughter pulled a facial hair from her face and she developed a blackhead.  Now the area is red, swollen and painful.  Patient has taken Tylenol .

## 2024-01-23 ENCOUNTER — Telehealth: Payer: Self-pay

## 2024-01-23 NOTE — Progress Notes (Signed)
   01/23/2024  Patient ID: Sarah Kane, female   DOB: 06-05-1971, 53 y.o.   MRN: [id number]  Outreach attempt to check in with patient on access/affordability of medications and management of chronic conditions.  I was not able to reach her, but HIPAA compliant voicemail was left with my direct phone number.  Patient is scheduled to see PCP in May; will let her know I have been unsuccessful in following up with the patient.  Linn Rich, PharmD, DPLA

## 2024-02-01 ENCOUNTER — Ambulatory Visit (INDEPENDENT_AMBULATORY_CARE_PROVIDER_SITE_OTHER): Admitting: Otolaryngology

## 2024-02-05 ENCOUNTER — Other Ambulatory Visit: Payer: Self-pay | Admitting: Medical-Surgical

## 2024-02-06 NOTE — Progress Notes (Unsigned)
 Cardiology Clinic Note   Patient Name: FARDOWSA SANDBOTHE Date of Encounter: 02/07/2024  Primary Care Provider:  Cherre Cornish, NP Primary Cardiologist:  Ola Berger, MD  Patient Profile    ZYLIAH FORTES 53 year old female presents to the clinic today for follow-up evaluation of her coronary artery disease and hypertension.  Past Medical History    Past Medical History:  Diagnosis Date   Arthritis    Coronary artery disease    COVID-19 05/2020   Diabetes mellitus without complication (HCC)    Diabetic retinopathy (HCC)    Hyperlipidemia    Hypertension    Hypertensive retinopathy    Myocardial infarct, old    Past Surgical History:  Procedure Laterality Date   CARDIAC CATHETERIZATION     CESAREAN SECTION  1990   CORONARY STENT INTERVENTION N/A 05/20/2020   Procedure: CORONARY STENT INTERVENTION;  Surgeon: Odie Benne, MD;  Location: [facility];  Service: Cardiovascular;  Laterality: N/A;   CORONARY STENT INTERVENTION N/A 07/22/2020   Procedure: CORONARY STENT INTERVENTION;  Surgeon: Odie Benne, MD;  Location: [facility];  Service: Cardiovascular;  Laterality: N/A;   LEFT HEART CATH AND CORONARY ANGIOGRAPHY N/A 05/20/2020   Procedure: LEFT HEART CATH AND CORONARY ANGIOGRAPHY;  Surgeon: Odie Benne, MD;  Location: [facility];  Service: Cardiovascular;  Laterality: N/A;   LEFT HEART CATH AND CORONARY ANGIOGRAPHY N/A 12/04/2023   Procedure: LEFT HEART CATH AND CORONARY ANGIOGRAPHY;  Surgeon: Swaziland, Peter M, MD;  Location: [facility] INVASIVE CV LAB;  Service: Cardiovascular;  Laterality: N/A;   TRIGGER FINGER RELEASE Right    thumb    Allergies  Allergies  Allergen Reactions   Other Other (See Comments)    Pt reports steriods shoot her blood sugar up and make her go into a coma   Nitroglycerin      Blood pressure dropped, was hospitalized     History of Present Illness    ALLIZZON COYLE has a PMH of coronary artery disease  status post PCI with DES to RCA in 2021, PCI/DES to proximal circumflex 07/22/2020, hyperlipidemia, hypertension, and type type 2 diabetes.  She was seen in follow-up by Lovette Rud, PA-C on 11/28/2023.  She noted chest pain and discomfort in the shoulder blade area.  She described the discomfort as an annoying type sensation.  She noted that the discomfort was present before her heart attack and it had recently returned.  This was causing significant concern.  She noted generalized weakness which she attributed to her physically demanding job at [employer].  She expressed frustration about her physical capability and decline in strength over the last 10 years.  She noted high levels of stress at work, at home, and with her health.  She was taking Trulicity but noted that Ozempic  was the only medication that was effective in lowering her A1c.  She was unable to afford Ozempic .  There was concern for in-stent restenosis due to her history.  Her Imdur  was increased to 60 mg daily.  She was scheduled for cardiac catheterization.  She presented to the hospital on 12/04/2023 and underwent LHC with Dr. Swaziland.  She was noted to have mid LAD-distal LAD lesion 55% stenosed, previously placed circumflex stent widely patent, previously placed distal RCA stent widely patent.  Her LV function was noted to be normal.  Medical management was recommended.  She presents to the clinic today for follow-up evaluation and states she is feeling much better since  her cardiac catheterization.  She does note some discomfort on deep palpation around her left trapezius.  We reviewed her cardiac catheterization.  She expressed understanding.  We reviewed her most recent lipid panel.  Her right radial cath site is healing well.  There is no signs of infection.  She asks if she can ride roller coasters this summer.  I will plan follow-up in 3 to 4 months.  We will continue her current medication regimen.  I also reviewed the importance of  high-fiber diet..  Today she denies chest pain, shortness of breath, lower extremity edema, fatigue, palpitations, melena, hematuria, hemoptysis, diaphoresis, weakness, presyncope, syncope, orthopnea, and PND.      Home Medications    Prior to Admission medications   Medication Sig Start Date End Date Taking? Authorizing Provider  acetaminophen  (TYLENOL ) 500 MG tablet Take 1,000 mg by mouth every 6 (six) hours as needed for mild pain or headache.     [provider]  atorvastatin  (LIPITOR ) 80 MG tablet Take 1 tablet (80 mg total) by mouth daily. 08/31/23   Elmyra Haggard, MD  clopidogrel  (PLAVIX ) 75 MG tablet Take 1 tablet (75 mg total) by mouth daily. 08/31/23   Elmyra Haggard, MD  Continuous Glucose Sensor (DEXCOM G7 SENSOR) MISC Apply 1 sensor to the skin for continuous glucose monitoring. Change every 10 days. 08/16/23   Cherre Cornish, NP  gabapentin  (NEURONTIN ) 300 MG capsule TAKE 1 CAPSULE BY MOUTH THREE TIMES DAILY 02/05/24   Cherre Cornish, NP  glimepiride  (AMARYL ) 4 MG tablet TAKE 2 TABLETS BY MOUTH ONCE DAILY WITH BREAKFAST 01/21/24   Cherre Cornish, NP  insulin  glargine, 2 Unit Dial, (TOUJEO  MAX SOLOSTAR) 300 UNIT/ML Solostar Pen Inject 32 Units into the skin daily. Increase Toujeo  to 32 units daily. Check sugars every 3 days. If fasting reading is greater than 130, increase the Toujeo  dose by 2 units. If sugar is 90 or less, decrease Toujeo  by 2 units. Max Toujeo  dose 40 units daily. 09/20/23   Cherre Cornish, NP  Insulin  Pen Needle 31G X 5 MM MISC Use one needle with Toujeo  insulin  pen to inject insulin  once daily. 08/15/22   Cherre Cornish, NP  isosorbide  mononitrate (IMDUR ) 60 MG 24 hr tablet TAKE 1  TABLET BY MOUTH ONCE DAILY 11/28/23   Conte, Tessa N, PA-C  metoprolol  succinate (TOPROL -XL) 25 MG 24 hr tablet Take 1 tablet (25 mg total) by mouth in the morning and at bedtime. 08/31/23   Elmyra Haggard, MD  prazosin  (MINIPRESS ) 2 MG capsule TAKE 2 CAPSULES BY MOUTH AT BEDTIME 11/07/23   Cherre Cornish, NP  predniSONE  (DELTASONE ) 20 MG tablet Take 3 tabs PO daily x 5 days. 01/21/24   Leonides Ramp, FNP  Semaglutide , 2 MG/DOSE, 8 MG/3ML SOPN Inject 2 mg as directed once a week. Patient taking differently: Inject 0.5 mg as directed once a week. 09/20/23   Cherre Cornish, NP  sitaGLIPtin  (JANUVIA ) 100 MG tablet Take 1 tablet (100 mg total) by mouth daily. 04/16/23   Cherre Cornish, NP    Family History    Family History  Problem Relation Age of Onset   High blood pressure Mother    Diabetes Mother    Diabetes Maternal Grandmother    High blood pressure Maternal Grandmother    Heart attack Maternal Grandfather    Diabetes Maternal Grandfather    High blood pressure Maternal Grandfather    High blood pressure Paternal Grandmother    High blood pressure Paternal Grandfather  Diabetes Maternal Aunt    Diabetes Maternal Uncle    Skin cancer Maternal Uncle    She indicated that her mother is deceased. She indicated that the status of her maternal grandmother is unknown. She indicated that the status of her maternal grandfather is unknown. She indicated that the status of her paternal grandmother is unknown. She indicated that the status of her paternal grandfather is unknown. She indicated that the status of her maternal aunt is unknown. She indicated that the status of her maternal uncle is unknown.  Social History    Social History   Socioeconomic History   Marital status: Divorced    Spouse name: Not on file   Number of children: 3   Years of education: Not on file   Highest education level: Not on file  Occupational History    Employer: [employer]  Tobacco Use   Smoking status: Never   Smokeless tobacco: Never  Vaping Use   Vaping status: Never Used  Substance and Sexual Activity   Alcohol use: No   Drug use: No   Sexual activity: Not Currently    Partners: Male  Other Topics Concern   Not on file  Social History Narrative   Not on file    Social Drivers of Health   Financial Resource Strain: Not on file  Food Insecurity: Not on file  Transportation Needs: Not on file  Physical Activity: Not on file  Stress: Not on file  Social Connections: Unknown (02/05/2022)   Received from [facility]   Social Network    Social Network: Not on file  Intimate Partner Violence: Unknown (12/28/2021)   Received from [facility], [facility]   HITS    Physically Hurt: Not on file    Insult or Talk Down To: Not on file    Threaten Physical Harm: Not on file    Scream or Curse: Not on file     Review of Systems    General:  No chills, fever, night sweats or weight changes.  Cardiovascular:  No chest pain, dyspnea on exertion, edema, orthopnea, palpitations, paroxysmal nocturnal dyspnea. Dermatological: No rash, lesions/masses Respiratory: No cough, dyspnea Urologic: No hematuria, dysuria Abdominal:   No nausea, vomiting, diarrhea, bright red blood per rectum, melena, or hematemesis Neurologic:  No visual changes, wkns, changes in mental status. All other systems reviewed and are otherwise negative except as noted above.  Physical Exam    VS:  BP 136/82   Pulse 77   Ht 5' (1.524 m)   Wt 187 lb 12.8 oz (85.2 kg)   SpO2 97%   BMI 36.68 kg/m  , BMI Body mass index is 36.68 kg/m. GEN: Well nourished, well developed, in no acute distress. HEENT: normal. Neck: Supple, no JVD, carotid bruits, or masses. Cardiac: RRR, no murmurs, rubs, or gallops. No clubbing, cyanosis, edema.  Radials/DP/PT 2+ and equal bilaterally.  Respiratory:  Respirations regular and unlabored, clear to auscultation bilaterally. GI: Soft, nontender, nondistended, BS + x 4. MS: no deformity or atrophy. Skin: warm and dry, no rash.  Right radial cath site healed well no signs of infection. Neuro:  Strength and sensation are intact. Psych: Normal affect.  Accessory Clinical Findings    Recent Labs: 03/22/2023: TSH 1.580 11/20/2023: ALT  36; BUN 11; Creatinine, Ser 0.75; Hemoglobin 15.3; Platelets 211; Potassium 4.5; Sodium 139   Recent Lipid Panel    Component Value Date/Time   CHOL 124 11/20/2023 1001  TRIG 129 11/20/2023 1001   HDL 44 11/20/2023 1001   CHOLHDL 2.8 11/20/2023 1001   CHOLHDL 2.8 01/02/2022 0000   VLDL 30 05/21/2020 0438   LDLCALC 57 11/20/2023 1001   LDLCALC 69 01/02/2022 0000         ECG personally reviewed by me today-  none today     Echocardiogram 11/29/2023  IMPRESSIONS     1. Left ventricular ejection fraction, by estimation, is 50 to 55%. The  left ventricle has low normal function. The left ventricle demonstrates  global hypokinesis. Left ventricular diastolic parameters are consistent  with Grade I diastolic dysfunction  (impaired relaxation).   2. Right ventricular systolic function is normal. The right ventricular  size is normal. Tricuspid regurgitation signal is inadequate for assessing  PA pressure.   3. The mitral valve is normal in structure. No evidence of mitral valve  regurgitation.   4. The aortic valve is tricuspid. Aortic valve regurgitation is not  visualized.   5. The inferior vena cava is normal in size with greater than 50%  respiratory variability, suggesting right atrial pressure of 3 mmHg.   Comparison(s): No significant change from prior study.   FINDINGS   Left Ventricle: Left ventricular ejection fraction, by estimation, is 50  to 55%. The left ventricle has low normal function. The left ventricle  demonstrates global hypokinesis. Global longitudinal strain performed but  not reported based on interpreter  judgement due to suboptimal tracking. The left ventricular internal cavity  size was normal in size. There is no left ventricular hypertrophy. Left  ventricular diastolic parameters are consistent with Grade I diastolic  dysfunction (impaired relaxation).   Right Ventricle: The right ventricular size is normal. Right ventricular  systolic function  is normal. Tricuspid regurgitation signal is inadequate  for assessing PA pressure.   Left Atrium: Left atrial size was normal in size.   Right Atrium: Right atrial size was normal in size.   Pericardium: There is no evidence of pericardial effusion.   Mitral Valve: The mitral valve is normal in structure. No evidence of  mitral valve regurgitation.   Tricuspid Valve: Tricuspid valve regurgitation is not demonstrated.   Aortic Valve: The aortic valve is tricuspid. Aortic valve regurgitation is  not visualized.   Pulmonic Valve: Pulmonic valve regurgitation is not visualized.   Aorta: The aortic root and ascending aorta are structurally normal, with  no evidence of dilitation.   Venous: The inferior vena cava is normal in size with greater than 50%  respiratory variability, suggesting right atrial pressure of 3 mmHg.   IAS/Shunts: No atrial level shunt detected by color flow Doppler.   Additional Comments: 3D was performed not requiring image post processing  on an independent workstation and was abnormal.     LHC 12/04/2023    Mid LAD to Dist LAD lesion is 55% stenosed.   Previously placed Prox Cx stent of unknown type is  widely patent.   Previously placed Dist RCA stent of unknown type is  widely patent.   The left ventricular systolic function is normal.   LV end diastolic pressure is normal.   The left ventricular ejection fraction is 55-65% by visual estimate.   Nonobstructive CAD. Stents in the proximal LCx and distal RCA are widely patent. The LAD is small in caliber and diffusely diseased in the mid to distal vessel Normal LV function Normal LVEDP   Plan: continue medical management  Diagnostic Dominance: Right  Intervention   Assessment & Plan  1.  Chest pain, coronary artery disease-noted to have trapezius discomfort with deep palpation.  Underwent cardiac catheterization on 12/04/2023.  She was noted to have widely patent stents and mild nonobstructive  CAD along her LAD with 55% stenosis.  Medical management was recommended.  Details above. Heart healthy low-sodium diet Continue Plavix , atorvastatin , Imdur , metoprolol  May use heat, ice, massage for pain in the trapezius region.  Hyperlipidemia-LDL 57 on 2/25. High-fiber diet Continue atorvastatin , Plavix  Maintain physical activity  HTN-BP today 136/82. Maintain blood pressure log Heart healthy low-sodium diet Continue metoprolol , Imdur   Type 2 diabetes-A1c 9.5 on 11/20/2023 Carb modified diet Continue physical activity Continue insulin  Follows with PCP  Disposition: Follow-up with Dr. Avanell Bob or APP in 3-4 months.   Chet Cota. Antjuan Rothe NP-C     02/07/2024, 8:26 AM [facility] [address] Office [phone] Fax [phone]    I spent 14 minutes examining this patient, reviewing medications, and using patient centered shared decision making involving their cardiac care.   I spent  20 minutes reviewing past medical history,  medications, and prior cardiac tests.

## 2024-02-07 ENCOUNTER — Ambulatory Visit: Attending: General Practice | Admitting: General Practice

## 2024-02-07 ENCOUNTER — Encounter: Payer: Self-pay | Admitting: General Practice

## 2024-02-07 VITALS — BP 136/82 | HR 77 | Ht 60.0 in | Wt 187.8 lb

## 2024-02-07 DIAGNOSIS — E785 Hyperlipidemia, unspecified: Secondary | ICD-10-CM | POA: Diagnosis not present

## 2024-02-07 DIAGNOSIS — E1165 Type 2 diabetes mellitus with hyperglycemia: Secondary | ICD-10-CM | POA: Diagnosis not present

## 2024-02-07 DIAGNOSIS — I2510 Atherosclerotic heart disease of native coronary artery without angina pectoris: Secondary | ICD-10-CM | POA: Diagnosis not present

## 2024-02-07 NOTE — Patient Instructions (Addendum)
 Medication Instructions:  The current medical regimen is effective;  continue present plan and medications as directed. Please refer to the Current Medication list given to you today.  *If you need a refill on your cardiac medications before your next appointment, please call your pharmacy*  Lab Work: NONE  Other Instructions PLEASE READ AND FOLLOW ATTACHED  SALTY 6   PLEASE READ AND FOLLOW ATTACHED INCREASED FIBER DIET FOR YOUR BECK PAIN YOUR MAY USE HEAT, ICE OR USE A TENNIS  Follow-Up: At [facility], you and your health needs are our priority.  As part of our continuing mission to provide you with exceptional heart care, our providers are all part of one team.  This team includes your primary Cardiologist (physician) and Advanced Practice Providers or APPs (Physician Assistants and Nurse Practitioners) who all work together to provide you with the care you need, when you need it.  Your next appointment:   3-4 month(s)  Provider:   Ola Berger, MD        High-Fiber Diet Fiber, also called dietary fiber, is found in foods such as fruits, vegetables, whole grains, and beans. A high-fiber diet can be good for your health. Your health care provider may recommend a high-fiber diet to help: Prevent trouble pooping (constipation). Lower your cholesterol. Treat the following conditions: Hemorrhoids. This is inflammation of veins in the anus. Inflammation of specific areas of the digestive tract. Irritable bowel syndrome (IBS). This is a problem of the large intestine, also called the colon, that sometimes causes belly pain and bloating. Prevent overeating as part of a weight-loss plan. Lower the risk of heart disease, type 2 diabetes, and certain cancers. What are tips for following this plan? Reading food labels  Check the nutrition facts label on foods for the amount of dietary fiber. Choose foods that have 4 grams of fiber or more per serving. The recommended goals for  how much fiber you should eat each day include: Males 61 years old or younger: 30-34 g. Males over 30 years old: 28-34 g. Females 33 years old or younger: 25-28 g. Females over 47 years old: 22-25 g. Your daily fiber goal is _____________ g. Shopping Choose whole fruits and vegetables instead of processed. For example, choose apples instead of apple juice or applesauce. Choose a variety of high-fiber foods such as avocados, lentils, oats, and pinto beans. Read the nutrition facts label on foods. Check for foods with added fiber. These foods often have high sugar and salt (sodium) amounts per serving. Cooking Use whole-grain flour for baking and cooking. Cook with brown rice instead of white rice. Make meals that have a lot of beans and vegetables in them, such as chili or vegetable-based soups. Meal planning Start the day with a breakfast that is high in fiber, such as a cereal that has 5 g of fiber or more per serving. Eat breads and cereals that are made with whole-grain flour instead of refined flour or white flour. Eat brown rice, bulgur wheat, or millet instead of white rice. Use beans in place of meat in soups, salads, and pasta dishes. Be sure that half of the grains you eat each day are whole grains. General information You can get the recommended amount of dietary fiber by: Eating a variety of fruits, vegetables, grains, nuts, and beans. Taking a fiber supplement if you aren't able to eat enough fiber. It's better to get fiber through food than from a supplement. Slowly increase how much fiber you eat. If  you increase the amount of fiber you eat too quickly, you may have bloating, cramping, or gas. Drink plenty of water to help you digest fiber. Choose high-fiber snacks, such as berries, raw vegetables, nuts, and popcorn. What foods should I eat? Fruits Berries. Pears. Apples. Oranges. Avocado. Prunes and raisins. Dried figs. Vegetables Sweet potatoes. Spinach. Kale.  Artichokes. Cabbage. Broccoli. Cauliflower. Green peas. Carrots. Squash. Grains Whole-grain breads. Multigrain cereal. Oats and oatmeal. Brown rice. Barley. Bulgur wheat. Millet. Quinoa. Bran muffins. Popcorn. Rye wafer crackers. Meats and other proteins Navy beans, kidney beans, and pinto beans. Soybeans. Split peas. Lentils. Nuts and seeds. Dairy Fiber-fortified yogurt. Fortified means that fiber has been added to the product. Beverages Fiber-fortified soy milk. Fiber-fortified orange juice. Other foods Fiber bars. The items listed above may not be all the foods and drinks you can have. Talk to a dietitian to learn more. What foods should I avoid? Fruits Fruit juice. Cooked, strained fruit. Vegetables Fried potatoes. Canned vegetables. Well-cooked vegetables. Grains White bread. Pasta made with refined flour. White rice. Meats and other proteins Fatty meat. Fried chicken or fried fish. Dairy Milk. Cream cheese. Sour cream. Fats and oils Butters. Beverages Soft drinks. Other foods Cakes and pastries. The items listed above may not be all the foods and drinks you should avoid. Talk to a dietitian to learn more. This information is not intended to replace advice given to you by your health care provider. Make sure you discuss any questions you have with your health care provider. Document Revised: 12/04/2022 Document Reviewed: 12/04/2022 Elsevier Patient Education  2024 [employer].

## 2024-02-17 ENCOUNTER — Other Ambulatory Visit: Payer: Self-pay | Admitting: Medical-Surgical

## 2024-02-20 ENCOUNTER — Ambulatory Visit: Payer: BC Managed Care – PPO | Admitting: Medical-Surgical

## 2024-02-20 NOTE — Progress Notes (Deleted)
   Established patient visit  History, exam, impression, and plan:  No problem-specific Assessment & Plan notes found for this encounter.   ROS  Physical Exam  Procedures performed this visit: None.  No follow-ups on file.  __________________________________ [name], DNP, APRN, FNP-BC Primary Care and Sports Medicine [facility]

## 2024-03-05 ENCOUNTER — Other Ambulatory Visit: Payer: Self-pay | Admitting: Medical-Surgical

## 2024-03-05 DIAGNOSIS — E1165 Type 2 diabetes mellitus with hyperglycemia: Secondary | ICD-10-CM

## 2024-03-05 MED ORDER — SEMAGLUTIDE (2 MG/DOSE) 8 MG/3ML ~~LOC~~ SOPN: 2.0000 mg | PEN_INJECTOR | SUBCUTANEOUS | 3 refills | Status: DC

## 2024-03-05 MED ORDER — GABAPENTIN 300 MG PO CAPS: 300.0000 mg | ORAL_CAPSULE | Freq: Three times a day (TID) | ORAL | 0 refills | Status: DC

## 2024-03-05 NOTE — Telephone Encounter (Signed)
 Copied from CRM [id number]. Topic: Clinical - Medication Refill >> Mar 05, 2024  9:29 AM Blair Bumpers wrote: Medication: gabapentin  (NEURONTIN ) 300 MG capsule & Semaglutide , 2 MG/DOSE, 8 MG/3ML SOPN  Has the patient contacted their pharmacy? Yes, they told her she needed new refills for 3 month supply. (Agent: If no, request that the patient contact the pharmacy for the refill. If patient does not wish to contact the pharmacy document the reason why and proceed with request.) (Agent: If yes, when and what did the pharmacy advise?)  This is the patient's preferred pharmacy:   [facility] [place] - [address] AT [facility] mail services [address] Mail Order pharmacy [place] [id number] Phone: [phone] Fax: [phone]  Is this the correct pharmacy for this prescription? Yes If no, delete pharmacy and type the correct one.   Has the prescription been filled recently? Yes  Is the patient out of the medication? Yes  Has the patient been seen for an appointment in the last year OR does the patient have an upcoming appointment? Yes  Can we respond through MyChart? Yes  Agent: Please be advised that Rx refills may take up to 3 business days. We ask that you follow-up with your pharmacy.

## 2024-03-25 ENCOUNTER — Other Ambulatory Visit: Payer: Self-pay | Admitting: Physician Assistant

## 2024-03-26 ENCOUNTER — Other Ambulatory Visit: Payer: Self-pay | Admitting: Medical-Surgical

## 2024-03-26 ENCOUNTER — Other Ambulatory Visit: Payer: Self-pay

## 2024-03-26 MED ORDER — DEXCOM G7 SENSOR MISC: 3 refills | Status: AC

## 2024-04-12 ENCOUNTER — Other Ambulatory Visit: Payer: Self-pay | Admitting: Physician Assistant

## 2024-04-17 ENCOUNTER — Other Ambulatory Visit: Payer: Self-pay | Admitting: Medical-Surgical

## 2024-04-17 DIAGNOSIS — E1165 Type 2 diabetes mellitus with hyperglycemia: Secondary | ICD-10-CM

## 2024-04-18 ENCOUNTER — Ambulatory Visit (INDEPENDENT_AMBULATORY_CARE_PROVIDER_SITE_OTHER): Admitting: Otolaryngology

## 2024-04-20 ENCOUNTER — Other Ambulatory Visit: Payer: Self-pay | Admitting: Medical-Surgical

## 2024-05-21 NOTE — Progress Notes (Unsigned)
 Cardiology Office Note   Date:  05/22/2024   ID:  Sarah Kane, DOB 08/12/1971, MRN [id number]  PCP:  Willo Mini, NP  Cardiologist: Dr. Vina Gull  Pt presents for f/u of CAD     History of Present Illness: Sarah Kane is a 53 y.o. female who has a hx of HTN, DM2 and CAD.  Aug 2021   NSTEMI   LHC that showed diffuse CAD with 30% pLAD, 99% dLAD, 70% prox LCx, 100% mRCA, 70% dRCA. She underwent PCI/DES to RCA with resolution of symptoms. Per chart review, staged PCI would be considered in the LCx if refractory symptoms.    October 2021  Continued to have angina    She ultimately underwent LHC on 07/22/2020 which showed a proximal LCx lesion at 70% at which time DES/PCI was placed with recommendations for DAPT with ASA and Brilinta  for 1 year.  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   Bills stressing her  Son recently hosp/dx with schizophrenia        Past Medical History:  Diagnosis Date   Arthritis    Coronary artery disease    COVID-19 05/2020   Diabetes mellitus without complication (HCC)    Diabetic retinopathy (HCC)    Hyperlipidemia    Hypertension    Hypertensive retinopathy    Myocardial infarct, old     Past Surgical History:  Procedure Laterality Date   CARDIAC CATHETERIZATION     CESAREAN SECTION  1990   CORONARY STENT INTERVENTION N/A 05/20/2020   Procedure: CORONARY STENT INTERVENTION;  Surgeon: Verlin Lonni BIRCH, MD;  Location: [facility] INVASIVE CV LAB;  Service: Cardiovascular;   Laterality: N/A;   CORONARY STENT INTERVENTION N/A 07/22/2020   Procedure: CORONARY STENT INTERVENTION;  Surgeon: Verlin Lonni BIRCH, MD;  Location: [facility] INVASIVE CV LAB;  Service: Cardiovascular;  Laterality: N/A;   LEFT HEART CATH AND CORONARY ANGIOGRAPHY N/A 05/20/2020   Procedure: LEFT HEART CATH AND CORONARY ANGIOGRAPHY;  Surgeon: Verlin Lonni BIRCH, MD;  Location: [facility] INVASIVE CV LAB;  Service: Cardiovascular;  Laterality: N/A;   LEFT HEART CATH AND CORONARY ANGIOGRAPHY N/A 12/04/2023   Procedure: LEFT HEART CATH AND CORONARY ANGIOGRAPHY;  Surgeon: [place], Peter M, MD;  Location: [facility] INVASIVE CV LAB;  Service: Cardiovascular;  Laterality: N/A;   TRIGGER FINGER RELEASE Right    thumb     Current Outpatient Medications  Medication Sig Dispense Refill   acetaminophen  (TYLENOL ) 500 MG tablet Take 1,000 mg by mouth every 6 (six) hours as needed for mild pain or headache.      atorvastatin  (LIPITOR ) 80 MG tablet Take 1 tablet (80 mg total) by mouth daily. 90 tablet 3   clopidogrel  (PLAVIX ) 75 MG tablet Take 1 tablet (75 mg total) by mouth daily. 90 tablet 3   Continuous Glucose  Sensor (DEXCOM G7 SENSOR) MISC Apply 1 sensor to the skin for continuous glucose monitoring. Change every 10 days. 9 each 3   gabapentin  (NEURONTIN ) 300 MG capsule Take 1 capsule (300 mg total) by mouth 3 (three) times daily. NEEDS APPOINTMENT FOR FURTHER REFILLS. 90 capsule 0   glimepiride  (AMARYL ) 4 MG tablet NEEDS APPOINTMENT FOR FURTHER REFILLS.TAKE 2 TABLETS BY MOUTH ONCE DAILY WITH BREAKFAST. 60 tablet 0   insulin  glargine, 2 Unit Dial, (TOUJEO  MAX SOLOSTAR) 300 UNIT/ML Solostar Pen Inject 32 Units into the skin daily. Increase Toujeo  to 32 units daily. Check sugars every 3 days. If fasting reading is greater than 130, increase the Toujeo  dose by 2 units. If sugar is 90 or less, decrease Toujeo  by 2 units. Max Toujeo  dose 40 units daily. 30 mL 1   Insulin  Pen Needle 31G X 5 MM MISC Use one needle with Toujeo   insulin  pen to inject insulin  once daily. 100 each 0   isosorbide  mononitrate (IMDUR ) 60 MG 24 hr tablet TAKE 1  TABLET BY MOUTH ONCE DAILY 180 tablet 2   JANUVIA  100 MG tablet Take 1 tablet by mouth once daily 30 tablet 0   metoprolol  succinate (TOPROL -XL) 25 MG 24 hr tablet Take 1 tablet (25 mg total) by mouth in the morning and at bedtime. 180 tablet 3   prazosin  (MINIPRESS ) 2 MG capsule TAKE 2 CAPSULES BY MOUTH AT BEDTIME 180 capsule 0   predniSONE  (DELTASONE ) 20 MG tablet Take 3 tabs PO daily x 5 days. 15 tablet 0   Semaglutide , 2 MG/DOSE, 8 MG/3ML SOPN Inject 2 mg as directed once a week. 9 mL 3   No current facility-administered medications for this visit.    Allergies:   Other and Nitroglycerin     Social History:  The patient  reports that she has never smoked. She has never used smokeless tobacco. She reports that she does not drink alcohol and does not use drugs.   Family History:  The patient's family history includes Diabetes in her maternal aunt, maternal grandfather, maternal grandmother, maternal uncle, and mother; Heart attack in her maternal grandfather; High blood pressure in her maternal grandfather, maternal grandmother, mother, paternal grandfather, and paternal grandmother; Skin cancer in her maternal uncle.    ROS:  Please see the history of present illness. Otherwise, review of systems are positive for none.   All other systems are reviewed and negative.    PHYSICAL EXAM: VS:  BP 110/68   Pulse 87   Ht 5' (1.524 m)   Wt 187 lb 9.6 oz (85.1 kg)   SpO2 95%   BMI 36.64 kg/m  , BMI Body mass index is 36.64 kg/m.   General: Morbidly obese 53 yo in NAD  Neck: No JVD  No bruits  Lungs:Clear to auscultation Cardiovascular: RRR with S1 S2. No murmurs      Extremities: No LE edema   EKG:  EKG not ordered today.  Cardiac studies  Echo  2025     1. Left ventricular ejection fraction, by estimation, is 50 to 55%. The  left ventricle has low normal function.  The left ventricle demonstrates  global hypokinesis. Left ventricular diastolic parameters are consistent  with Grade I diastolic dysfunction  (impaired relaxation).   2. Right ventricular systolic function is normal. The right ventricular  size is normal. Tricuspid regurgitation signal is inadequate for assessing  PA pressure.   3. The mitral valve is normal in structure. No evidence of mitral valve  regurgitation.  4. The aortic valve is tricuspid. Aortic valve regurgitation is not  visualized.   5. The inferior vena cava is normal in size with greater than 50%  respiratory variability, suggesting right atrial pressure of 3 mmHg.   Comparison(s): No significant change from prior study.        LHC 12/04/2023     Mid LAD to Dist LAD lesion is 55% stenosed.   Previously placed Prox Cx stent of unknown type is  widely patent.   Previously placed Dist RCA stent of unknown type is  widely patent.   The left ventricular systolic function is normal.   LV end diastolic pressure is normal.   The left ventricular ejection fraction is 55-65% by visual estimate.   Nonobstructive CAD. Stents in the proximal LCx and distal RCA are widely patent. The LAD is small in caliber and diffusely diseased in the mid to distal vessel Normal LV function Normal LVEDP   Plan: continue medical management   Diagnostic Dominance: Right  Recent Labs: 11/20/2023: ALT 36; BUN 11; Creatinine, Ser 0.75; Hemoglobin 15.3; Platelets 211; Potassium 4.5; Sodium 139    Lipid Panel    Component Value Date/Time   CHOL 124 11/20/2023 1001   TRIG 129 11/20/2023 1001   HDL 44 11/20/2023 1001   CHOLHDL 2.8 11/20/2023 1001   CHOLHDL 2.8 01/02/2022 0000   VLDL 30 05/21/2020 0438   LDLCALC 57 11/20/2023 1001   LDLCALC 69 01/02/2022 0000   Wt Readings from Last 3 Encounters:  05/22/24 187 lb 9.6 oz (85.1 kg)  02/07/24 187 lb 12.8 oz (85.2 kg)  01/21/24 185 lb (83.9 kg)    Other studies Reviewed: Additional  studies/ records that were reviewed today include:  Review of the above records demonstrates:   Echo 07/01/21    Cardiac Catheterization 07/22/2020:   Prox Cx lesion is 70% stenosed. A drug-eluting stent was successfully placed using a SYNERGY XD 3.0X16. Post intervention, there is a 0% residual stenosis.   1. Severe stenosis proximal Circumflex artery 2. Successful PTCA/DES x 1 proximal Circumflex   Recommendations: Continue DAPT with ASA and Brilinta  for one year.    Diagnostic Dominance: Right  Intervention       ASSESSMENT AND PLAN:  1  CAD   Pt s/p LHC in march 2025   Mod LAD dz  Patent stents   Pt currently denies angina  Active at work    2  Hx syncope    SPell sounded orthostatic in nature    None since   Follow   3  HL   LDL 57  HDL 44 Trig 129 in Feb 2025  Keep on statin    4. DM2: -HgbA12C 9.5 Last AA1C in Fegb 2025 was 9.5   DIet is hart for her    DIscussed again   Time restrict meals   Keep on same meds     5  Lifestyle  Pt under marked increase stress with work, home, family   DIfficult to know how to help     No programs in place to help       Signed, Vina Gull, MD  05/22/2024 9:42 AM    [facility] [address] [place], [place], [place]  [address] Phone: [phone]; Fax: [phone]

## 2024-05-22 ENCOUNTER — Ambulatory Visit: Attending: Cardiology | Admitting: Internal Medicine

## 2024-05-22 ENCOUNTER — Encounter: Payer: Self-pay | Admitting: Internal Medicine

## 2024-05-22 VITALS — BP 110/68 | HR 87 | Ht 60.0 in | Wt 187.6 lb

## 2024-05-22 DIAGNOSIS — I2510 Atherosclerotic heart disease of native coronary artery without angina pectoris: Secondary | ICD-10-CM

## 2024-05-22 NOTE — Patient Instructions (Signed)
 Medication Instructions:  Your physician recommends that you continue on your current medications as directed. Please refer to the Current Medication list given to you today.  *If you need a refill on your cardiac medications before your next appointment, please call your pharmacy*  Follow-Up: At [facility], you and your health needs are our priority.  As part of our continuing mission to provide you with exceptional heart care, our providers are all part of one team.  This team includes your primary Cardiologist (physician) and Advanced Practice Providers or APPs (Physician Assistants and Nurse Practitioners) who all work together to provide you with the care you need, when you need it.  Your next appointment:   6 month(s)  Provider:   Vina Gull, MD   We recommend signing up for the patient portal called MyChart.  Sign up information is provided on this After Visit Summary.  MyChart is used to connect with patients for Virtual Visits (Telemedicine).  Patients are able to view lab/test results, encounter notes, upcoming appointments, etc.  Non-urgent messages can be sent to your provider as well.    To learn more about what you can do with MyChart, go to [email or web address].

## 2024-05-27 ENCOUNTER — Other Ambulatory Visit: Payer: Self-pay | Admitting: Medical-Surgical

## 2024-06-03 ENCOUNTER — Other Ambulatory Visit: Payer: Self-pay | Admitting: Medical-Surgical

## 2024-06-03 ENCOUNTER — Other Ambulatory Visit: Payer: Self-pay

## 2024-06-03 DIAGNOSIS — E1165 Type 2 diabetes mellitus with hyperglycemia: Secondary | ICD-10-CM

## 2024-06-03 MED ORDER — PRAZOSIN HCL 2 MG PO CAPS: 4.0000 mg | ORAL_CAPSULE | Freq: Every day | ORAL | 0 refills | Status: DC

## 2024-06-03 MED ORDER — GLIMEPIRIDE 4 MG PO TABS: ORAL_TABLET | ORAL | 0 refills | Status: DC

## 2024-06-11 ENCOUNTER — Encounter: Payer: Self-pay | Admitting: Medical-Surgical

## 2024-06-11 ENCOUNTER — Ambulatory Visit: Admitting: Medical-Surgical

## 2024-06-11 VITALS — BP 112/76 | HR 78 | Resp 20 | Ht 60.0 in | Wt 184.0 lb

## 2024-06-11 DIAGNOSIS — E1165 Type 2 diabetes mellitus with hyperglycemia: Secondary | ICD-10-CM

## 2024-06-11 DIAGNOSIS — Z5320 Procedure and treatment not carried out because of patient's decision for unspecified reasons: Secondary | ICD-10-CM | POA: Insufficient documentation

## 2024-06-11 DIAGNOSIS — M25562 Pain in left knee: Secondary | ICD-10-CM | POA: Diagnosis not present

## 2024-06-11 DIAGNOSIS — G8929 Other chronic pain: Secondary | ICD-10-CM

## 2024-06-11 DIAGNOSIS — E1142 Type 2 diabetes mellitus with diabetic polyneuropathy: Secondary | ICD-10-CM | POA: Diagnosis not present

## 2024-06-11 DIAGNOSIS — F515 Nightmare disorder: Secondary | ICD-10-CM | POA: Diagnosis not present

## 2024-06-11 DIAGNOSIS — Z7985 Long-term (current) use of injectable non-insulin antidiabetic drugs: Secondary | ICD-10-CM

## 2024-06-11 LAB — POCT GLYCOSYLATED HEMOGLOBIN (HGB A1C)
HbA1c, POC (controlled diabetic range): 8.2 % — AB (ref 0.0–7.0)
Hemoglobin A1C: 8.2 % — AB (ref 4.0–5.6)

## 2024-06-11 LAB — POCT UA - MICROALBUMIN
Albumin/Creatinine Ratio, Urine, POC: <30
Creatinine, POC: 200 mg/dL
Microalbumin Ur, POC: 30 mg/L

## 2024-06-11 MED ORDER — TOUJEO MAX SOLOSTAR 300 UNIT/ML ~~LOC~~ SOPN: 32.0000 [IU] | PEN_INJECTOR | Freq: Every day | SUBCUTANEOUS | 1 refills | Status: AC

## 2024-06-11 MED ORDER — INSULIN PEN NEEDLE 31G X 5 MM MISC: 11 refills | Status: AC

## 2024-06-11 MED ORDER — PRAZOSIN HCL 2 MG PO CAPS: 4.0000 mg | ORAL_CAPSULE | Freq: Every day | ORAL | 3 refills | Status: AC

## 2024-06-11 MED ORDER — SITAGLIPTIN PHOSPHATE 100 MG PO TABS: 100.0000 mg | ORAL_TABLET | Freq: Every day | ORAL | 3 refills | Status: AC

## 2024-06-11 MED ORDER — GABAPENTIN 300 MG PO CAPS: 300.0000 mg | ORAL_CAPSULE | Freq: Three times a day (TID) | ORAL | 3 refills | Status: AC

## 2024-06-11 MED ORDER — GLIMEPIRIDE 4 MG PO TABS: ORAL_TABLET | ORAL | 3 refills | Status: AC

## 2024-06-11 NOTE — Assessment & Plan Note (Signed)
 Type 2 diabetes with improved A1c from 9.0% to 8.2%. Ozempic  assistance previously unavailable but will check with clinical pharmacy about PAP. Dexcom beneficial. Peripheral neuropathy managed with gabapentin . - Continue Ozempic , Toujeo , glimepiride , and Januvia . - Advise half dose of Toujeo  if dietary intake is insufficient. - Work on dietary compliance.  - Encourage continued Dexcom use when available. - Continue gabapentin  TID.

## 2024-06-11 NOTE — Progress Notes (Signed)
 Established patient visit   History of Present Illness   Discussed the use of AI scribe software for clinical note transcription with the patient, who gave verbal consent to proceed.  History of Present Illness   Sarah Kane is a 53 year old female with diabetes who presents for medication refills and management of her diabetes.  Glycemic control and antihyperglycemic medication adherence - Diabetes management has improved, with A1c decreasing from 9.0 in February to 8.2 - Current regimen includes Ozempic  (recently refilled and used consistently), Toujeo  (taken inconsistently due to concerns about hypoglycemia, especially when not eating properly; takes 32 units but sometimes skips doses), glimepiride , and Januvia  (taken at night) - No use of continuous glucose monitor in the last few weeks - Concern about future affordability of Ozempic   Hypoglycemia concerns - Inconsistent use of Toujeo  due to fear of hypoglycemia, particularly when not eating adequately  Sleep disturbance and nightmares - Significant sleep disturbances with frequent daytime somnolence due to nightmares - Prazosin  improves sleep quality; absence of prazosin  results in severe nightmares  Peripheral neuropathy - Gabapentin  taken three times daily provides relief for neuropathic pain in the foot  Immunization reactions and preventive care - Received pneumonia vaccine in 2018; reluctant to receive another soon - Significant bruising after previous shingles vaccine - No mammogram performed; not concerned due to absence of family history of breast cancer - Needs eye exam; reluctant to undergo treatments involving ocular injections       Physical Exam   Physical Exam Vitals reviewed.  Constitutional:      General: She is not in acute distress.    Appearance: Normal appearance. She is obese. She is not ill-appearing.  HENT:     Head: Normocephalic and atraumatic.  Cardiovascular:     Rate and Rhythm:  Normal rate and regular rhythm.     Pulses: Normal pulses.     Heart sounds: Normal heart sounds. No murmur heard.    No friction rub. No gallop.  Pulmonary:     Effort: Pulmonary effort is normal. No respiratory distress.     Breath sounds: Normal breath sounds. No wheezing.  Skin:    General: Skin is warm and dry.  Neurological:     Mental Status: She is alert and oriented to person, place, and time.  Psychiatric:        Mood and Affect: Mood normal.        Behavior: Behavior normal.        Thought Content: Thought content normal.        Judgment: Judgment normal.    Assessment & Plan   Problem List Items Addressed This Visit       Endocrine   Diabetic peripheral neuropathy associated with type 2 diabetes mellitus (HCC)   Type 2 diabetes with improved A1c from 9.0% to 8.2%. Ozempic  assistance previously unavailable but will check with clinical pharmacy about PAP. Dexcom beneficial. Peripheral neuropathy managed with gabapentin . - Continue Ozempic , Toujeo , glimepiride , and Januvia . - Advise half dose of Toujeo  if dietary intake is insufficient. - Work on dietary compliance.  - Encourage continued Dexcom use when available. - Continue gabapentin  TID.      Relevant Medications   gabapentin  (NEURONTIN ) 300 MG capsule   glimepiride  (AMARYL ) 4 MG tablet   insulin  glargine, 2 Unit Dial, (TOUJEO  MAX SOLOSTAR) 300 UNIT/ML Solostar Pen   sitaGLIPtin  (JANUVIA ) 100 MG tablet   Type 2 diabetes mellitus with hyperglycemia, without long-term current use  of insulin  (HCC) - Primary   Type 2 diabetes with improved A1c from 9.0% to 8.2%. Ozempic  assistance previously unavailable but will check with clinical pharmacy about PAP. Dexcom beneficial. Peripheral neuropathy managed with gabapentin . - Continue Ozempic , Toujeo , glimepiride , and Januvia . - Advise half dose of Toujeo  if dietary intake is insufficient. - Work on dietary compliance.  - Encourage continued Dexcom use when available. -  Continue gabapentin  TID.      Relevant Medications   glimepiride  (AMARYL ) 4 MG tablet   insulin  glargine, 2 Unit Dial, (TOUJEO  MAX SOLOSTAR) 300 UNIT/ML Solostar Pen   sitaGLIPtin  (JANUVIA ) 100 MG tablet   Other Relevant Orders   POCT HgB A1C (Completed)   POCT UA - Microalbumin (Completed)     Other   Mammogram declined   Patient reports she has never had a mammogram and there is no family history of breast cancer. - Encouraged completion of a mammogram.  - Reviewed recommendations for screening.  - Patient declined.      Nightmares   Chronic insomnia and nightmare disorder worsened by lack of prazosin . Has strange dreams when on the prazosin  but not nightmares.  - Continue prazosin  4mg  nightly.       Follow up   Return in about 3 months (around 09/10/2024) for DM follow up. __________________________________ Zada FREDRIK Palin, DNP, APRN, FNP-BC Primary Care and Sports Medicine [facility]

## 2024-06-11 NOTE — Assessment & Plan Note (Signed)
 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 Has strange dreams when on the prazosin  but not nightmares.  - Continue prazosin  4mg  nightly.

## 2024-06-11 NOTE — Assessment & Plan Note (Signed)
 Patient reports she has never had a mammogram and there is no family history of breast cancer. - Encouraged completion of a mammogram.  - Reviewed recommendations for screening.  - Patient declined.

## 2024-06-12 NOTE — Addendum Note (Signed)
 Addended byBETHA WILLO MINI on: 06/12/2024 12:42 PM   Modules accepted: Orders

## 2024-08-27 ENCOUNTER — Other Ambulatory Visit: Payer: Self-pay | Admitting: Internal Medicine

## 2024-09-09 ENCOUNTER — Other Ambulatory Visit: Payer: Self-pay | Admitting: Internal Medicine

## 2024-09-11 ENCOUNTER — Telehealth: Payer: Self-pay

## 2024-09-11 ENCOUNTER — Ambulatory Visit: Admitting: Medical-Surgical

## 2024-09-11 ENCOUNTER — Encounter: Payer: Self-pay | Admitting: Medical-Surgical

## 2024-09-11 VITALS — BP 123/79 | HR 82 | Resp 20 | Ht 60.0 in | Wt 184.0 lb

## 2024-09-11 DIAGNOSIS — E1165 Type 2 diabetes mellitus with hyperglycemia: Secondary | ICD-10-CM

## 2024-09-11 DIAGNOSIS — E1142 Type 2 diabetes mellitus with diabetic polyneuropathy: Secondary | ICD-10-CM

## 2024-09-11 DIAGNOSIS — Z7985 Long-term (current) use of injectable non-insulin antidiabetic drugs: Secondary | ICD-10-CM | POA: Diagnosis not present

## 2024-09-11 LAB — POCT GLYCOSYLATED HEMOGLOBIN (HGB A1C)
HbA1c, POC (controlled diabetic range): 8.4 % — AB (ref 0.0–7.0)
Hemoglobin A1C: 8.4 % — AB (ref 4.0–5.6)

## 2024-09-11 NOTE — Progress Notes (Signed)
 Medical screening examination/treatment was performed by qualified clinical staff member and as supervising provider I was immediately available for consultation/collaboration. I have reviewed documentation and agree with assessment and plan.  [name], DNP, APRN, FNP-BC [facility] and Sports Medicine

## 2024-09-11 NOTE — Progress Notes (Addendum)
 Established Patient Office Visit  Subjective   Patient ID: Sarah Kane, female    DOB: [birth year]  Age: 53 y.o. MRN: [id number]  No chief complaint on file.   HPI  53 year old female presents for 3 month follow up on Type 2 Diabetes  Patient is currently being treated with Ozempic  2 mg weekly, Toujeo  32 units daily, glimepiride  4 mg two times daily, and Januvia  100 mg. She is not currently using her dexcom and has not used for a couple of weeks. Has not been checking her blood sugars. Patient is non compliant with diet and consumes mostly carbohydrates daily and not much protein. Denies having any side effects to her medications. Denies hypoglycemic episodes. Previous A1C 8.2%.   Peripheral Neuropathy Patient reports having some breakthrough pain when she gets home from work. She currently takes a 300 mg tablet before going to work and 600 mg tablets when she gets home from work. Would like to discuss increasing her medication to help with the breakthrough pain.  Review of Systems  Constitutional: Negative.   HENT: Negative.    Eyes:  Positive for blurred vision.  Respiratory: Negative.    Cardiovascular: Negative.   Gastrointestinal: Negative.   Genitourinary: Negative.   Musculoskeletal: Negative.        Bilateral foot   Skin: Negative.   Neurological: Negative.   Endo/Heme/Allergies: Negative.   Psychiatric/Behavioral:  The patient is nervous/anxious.       Objective:     There were no vitals taken for this visit. BP Readings from Last 3 Encounters:  06/11/24 112/76  05/22/24 110/68  02/07/24 136/82      Physical Exam Vitals and nursing note reviewed.  Constitutional:      General: She is not in acute distress.    Appearance: Normal appearance.  Cardiovascular:     Rate and Rhythm: Normal rate and regular rhythm.     Pulses: Normal pulses.     Heart sounds: Normal heart sounds.  Pulmonary:     Effort: Pulmonary effort is normal.     Breath sounds: Normal  breath sounds.  Neurological:     General: No focal deficit present.     Mental Status: She is alert and oriented to person, place, and time.  Psychiatric:        Mood and Affect: Mood normal.        Behavior: Behavior normal.        Thought Content: Thought content normal.        Judgment: Judgment normal.      No results found for any visits on 09/11/24.  Last hemoglobin A1c Lab Results  Component Value Date   HGBA1C 8.2 (A) 06/11/2024   HGBA1C 8.2 (A) 06/11/2024      The ASCVD Risk score (Arnett DK, et al., 2019) failed to calculate for the following reasons:   Risk score cannot be calculated because patient has a medical history suggesting prior/existing ASCVD   * - Cholesterol units were assumed    Assessment & Plan:   1. Type 2 diabetes mellitus with hyperglycemia, without long-term current use of insulin  (HCC) (Primary) -Continue with current regimen Ozempic , Toujeo , glimepiride , and Januvia . -Discussed dietary changes to healthier food choices and decreasing carb/sugar intake -Referral to nutritionist -Previous A1C 8.2 % -A1C today is 8.4%   2. Diabetic peripheral neuropathy associated with type 2 diabetes mellitus (HCC) -Increase gabapentin  to 600 mg before work and continue taking 600 mg in the morning after getting  off of work.    Return in about 3 months (around 12/10/2024) for DM follow up.    Derrek JINNY Freund, NP Student

## 2024-09-11 NOTE — Telephone Encounter (Signed)
 Left message for a return call. We do not have a recent mammogram.

## 2024-09-20 ENCOUNTER — Other Ambulatory Visit: Payer: Self-pay | Admitting: Medical-Surgical

## 2024-10-15 ENCOUNTER — Other Ambulatory Visit: Payer: Self-pay | Admitting: Medical-Surgical

## 2024-12-12 ENCOUNTER — Ambulatory Visit: Admitting: Medical-Surgical
